# Patient Record
Sex: Female | Born: 1970 | Race: White | Hispanic: No | Marital: Married | Smoking: Current every day smoker
Health system: Southern US, Community
[De-identification: ages and names within clinical notes are randomized; demographics above are authoritative.]

## PROBLEM LIST (undated history)

## (undated) DIAGNOSIS — R05 Cough: Secondary | ICD-10-CM

## (undated) DIAGNOSIS — M545 Other chronic pain: Secondary | ICD-10-CM

## (undated) DIAGNOSIS — G971 Other reaction to spinal and lumbar puncture: Secondary | ICD-10-CM

## (undated) DIAGNOSIS — I10 Essential (primary) hypertension: Secondary | ICD-10-CM

## (undated) DIAGNOSIS — R3915 Urgency of urination: Secondary | ICD-10-CM

## (undated) DIAGNOSIS — A4902 Methicillin resistant Staphylococcus aureus infection, unspecified site: Secondary | ICD-10-CM

## (undated) DIAGNOSIS — G4700 Insomnia, unspecified: Secondary | ICD-10-CM

## (undated) DIAGNOSIS — J9819 Other pulmonary collapse: Secondary | ICD-10-CM

## (undated) DIAGNOSIS — F319 Bipolar disorder, unspecified: Secondary | ICD-10-CM

## (undated) DIAGNOSIS — G8929 Other chronic pain: Secondary | ICD-10-CM

## (undated) DIAGNOSIS — K219 Gastro-esophageal reflux disease without esophagitis: Secondary | ICD-10-CM

## (undated) DIAGNOSIS — M549 Dorsalgia, unspecified: Secondary | ICD-10-CM

## (undated) DIAGNOSIS — R059 Cough, unspecified: Secondary | ICD-10-CM

## (undated) DIAGNOSIS — F419 Anxiety disorder, unspecified: Secondary | ICD-10-CM

## (undated) DIAGNOSIS — F1910 Other psychoactive substance abuse, uncomplicated: Secondary | ICD-10-CM

## (undated) DIAGNOSIS — R531 Weakness: Secondary | ICD-10-CM

## (undated) DIAGNOSIS — J189 Pneumonia, unspecified organism: Secondary | ICD-10-CM

## (undated) DIAGNOSIS — R112 Nausea with vomiting, unspecified: Secondary | ICD-10-CM

## (undated) DIAGNOSIS — Z9889 Other specified postprocedural states: Secondary | ICD-10-CM

## (undated) HISTORY — PX: LUMBAR DISC SURGERY: SHX700

## (undated) HISTORY — DX: Other chronic pain: M54.50

## (undated) HISTORY — PX: FRACTURE SURGERY: SHX138

## (undated) HISTORY — PX: TONSILLECTOMY: SUR1361

## (undated) HISTORY — DX: Low back pain: M54.5

## (undated) HISTORY — DX: Methicillin resistant Staphylococcus aureus infection, unspecified site: A49.02

## (undated) HISTORY — PX: EPIDURAL BLOCK INJECTION: SHX1516

## (undated) HISTORY — PX: BACK SURGERY: SHX140

## (undated) HISTORY — DX: Other chronic pain: G89.29

---
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
## 1990-06-28 HISTORY — PX: PERCUTANEOUS PINNING: SHX2209

## 1994-06-28 HISTORY — PX: ANTERIOR CERVICAL DECOMP/DISCECTOMY FUSION: SHX1161

## 1998-09-26 ENCOUNTER — Other Ambulatory Visit: Admission: RE | Admit: 1998-09-26 | Discharge: 1998-09-26 | Payer: Self-pay | Admitting: Obstetrics and Gynecology

## 1998-10-14 ENCOUNTER — Encounter: Payer: Self-pay | Admitting: Emergency Medicine

## 1998-10-14 ENCOUNTER — Emergency Department (HOSPITAL_COMMUNITY): Admission: EM | Admit: 1998-10-14 | Discharge: 1998-10-14 | Payer: Self-pay | Admitting: Emergency Medicine

## 2000-06-28 HISTORY — PX: ELBOW FRACTURE SURGERY: SHX616

## 2001-03-06 ENCOUNTER — Other Ambulatory Visit: Admission: RE | Admit: 2001-03-06 | Discharge: 2001-03-06 | Payer: Self-pay | Admitting: Gynecology

## 2001-11-09 ENCOUNTER — Ambulatory Visit (HOSPITAL_BASED_OUTPATIENT_CLINIC_OR_DEPARTMENT_OTHER): Admission: RE | Admit: 2001-11-09 | Discharge: 2001-11-09 | Payer: Self-pay | Admitting: Orthopedic Surgery

## 2003-08-29 ENCOUNTER — Other Ambulatory Visit: Admission: RE | Admit: 2003-08-29 | Discharge: 2003-08-29 | Payer: Self-pay | Admitting: Gynecology

## 2006-06-28 HISTORY — PX: HARDWARE REMOVAL: SHX979

## 2006-06-28 HISTORY — PX: POSTERIOR LUMBAR FUSION: SHX6036

## 2007-06-15 ENCOUNTER — Inpatient Hospital Stay (HOSPITAL_COMMUNITY): Admission: AD | Admit: 2007-06-15 | Discharge: 2007-06-15 | Payer: Self-pay | Admitting: Obstetrics & Gynecology

## 2007-12-02 ENCOUNTER — Encounter: Admission: RE | Admit: 2007-12-02 | Discharge: 2007-12-02 | Payer: Self-pay | Admitting: Orthopedic Surgery

## 2008-02-21 ENCOUNTER — Ambulatory Visit (HOSPITAL_COMMUNITY): Admission: RE | Admit: 2008-02-21 | Discharge: 2008-02-22 | Payer: Self-pay | Admitting: Orthopaedic Surgery

## 2008-03-18 ENCOUNTER — Encounter: Admission: RE | Admit: 2008-03-18 | Discharge: 2008-03-18 | Payer: Self-pay | Admitting: Orthopaedic Surgery

## 2008-03-28 ENCOUNTER — Ambulatory Visit (HOSPITAL_COMMUNITY): Admission: RE | Admit: 2008-03-28 | Discharge: 2008-03-28 | Payer: Self-pay | Admitting: Orthopaedic Surgery

## 2008-04-01 ENCOUNTER — Encounter (INDEPENDENT_AMBULATORY_CARE_PROVIDER_SITE_OTHER): Payer: Self-pay | Admitting: Orthopaedic Surgery

## 2008-04-01 ENCOUNTER — Ambulatory Visit (HOSPITAL_COMMUNITY): Admission: RE | Admit: 2008-04-01 | Discharge: 2008-04-02 | Payer: Self-pay | Admitting: Orthopaedic Surgery

## 2008-10-02 ENCOUNTER — Emergency Department (HOSPITAL_COMMUNITY): Admission: EM | Admit: 2008-10-02 | Discharge: 2008-10-02 | Payer: Self-pay | Admitting: Emergency Medicine

## 2009-04-28 ENCOUNTER — Observation Stay (HOSPITAL_COMMUNITY): Admission: EM | Admit: 2009-04-28 | Discharge: 2009-04-29 | Payer: Self-pay | Admitting: Emergency Medicine

## 2009-05-23 ENCOUNTER — Encounter: Admission: RE | Admit: 2009-05-23 | Discharge: 2009-05-23 | Payer: Self-pay | Admitting: Neurosurgery

## 2009-06-04 ENCOUNTER — Encounter: Admission: RE | Admit: 2009-06-04 | Discharge: 2009-06-04 | Payer: Self-pay | Admitting: Neurosurgery

## 2009-07-16 ENCOUNTER — Encounter: Payer: Self-pay | Admitting: Neurosurgery

## 2009-07-18 ENCOUNTER — Inpatient Hospital Stay (HOSPITAL_COMMUNITY): Admission: RE | Admit: 2009-07-18 | Discharge: 2009-07-22 | Payer: Self-pay | Admitting: Neurosurgery

## 2009-08-25 ENCOUNTER — Encounter: Admission: RE | Admit: 2009-08-25 | Discharge: 2009-08-25 | Payer: Self-pay | Admitting: Neurosurgery

## 2009-09-18 ENCOUNTER — Encounter: Admission: RE | Admit: 2009-09-18 | Discharge: 2009-09-18 | Payer: Self-pay | Admitting: Neurosurgery

## 2009-09-21 ENCOUNTER — Emergency Department (HOSPITAL_COMMUNITY): Admission: EM | Admit: 2009-09-21 | Discharge: 2009-09-21 | Payer: Self-pay | Admitting: Emergency Medicine

## 2009-11-28 ENCOUNTER — Encounter: Admission: RE | Admit: 2009-11-28 | Discharge: 2009-11-28 | Payer: Self-pay | Admitting: Neurosurgery

## 2009-12-19 ENCOUNTER — Encounter
Admission: RE | Admit: 2009-12-19 | Discharge: 2010-03-09 | Payer: Self-pay | Admitting: Physical Medicine & Rehabilitation

## 2009-12-25 ENCOUNTER — Ambulatory Visit: Payer: Self-pay | Admitting: Physical Medicine & Rehabilitation

## 2010-03-09 ENCOUNTER — Ambulatory Visit: Payer: Self-pay | Admitting: Physical Medicine & Rehabilitation

## 2010-03-30 ENCOUNTER — Encounter
Admission: RE | Admit: 2010-03-30 | Discharge: 2010-03-30 | Payer: Self-pay | Source: Home / Self Care | Attending: Physical Medicine & Rehabilitation | Admitting: Physical Medicine & Rehabilitation

## 2010-06-10 ENCOUNTER — Ambulatory Visit (HOSPITAL_COMMUNITY): Admission: RE | Admit: 2010-06-10 | Payer: Self-pay | Source: Home / Self Care | Admitting: Neurosurgery

## 2010-07-03 ENCOUNTER — Ambulatory Visit (HOSPITAL_COMMUNITY)
Admission: RE | Admit: 2010-07-03 | Discharge: 2010-07-03 | Payer: Self-pay | Source: Home / Self Care | Attending: Neurosurgery | Admitting: Neurosurgery

## 2010-07-06 ENCOUNTER — Encounter
Admission: RE | Admit: 2010-07-06 | Discharge: 2010-07-06 | Payer: Self-pay | Source: Home / Self Care | Attending: Neurosurgery | Admitting: Neurosurgery

## 2010-07-18 ENCOUNTER — Encounter: Payer: Self-pay | Admitting: Neurosurgery

## 2010-07-19 ENCOUNTER — Encounter: Payer: Self-pay | Admitting: Neurosurgery

## 2010-07-19 ENCOUNTER — Encounter: Payer: Self-pay | Admitting: Orthopaedic Surgery

## 2010-07-29 ENCOUNTER — Encounter (HOSPITAL_COMMUNITY): Payer: Managed Care, Other (non HMO)

## 2010-07-29 DIAGNOSIS — M545 Low back pain, unspecified: Secondary | ICD-10-CM | POA: Insufficient documentation

## 2010-07-29 DIAGNOSIS — IMO0002 Reserved for concepts with insufficient information to code with codable children: Secondary | ICD-10-CM | POA: Insufficient documentation

## 2010-07-29 DIAGNOSIS — Z01812 Encounter for preprocedural laboratory examination: Secondary | ICD-10-CM | POA: Insufficient documentation

## 2010-07-29 LAB — CBC
HCT: 35.9 % — ABNORMAL LOW (ref 36.0–46.0)
MCH: 34.4 pg — ABNORMAL HIGH (ref 26.0–34.0)
MCV: 102 fL — ABNORMAL HIGH (ref 78.0–100.0)
Platelets: 346 10*3/uL (ref 150–400)
RBC: 3.52 MIL/uL — ABNORMAL LOW (ref 3.87–5.11)
WBC: 4.9 10*3/uL (ref 4.0–10.5)

## 2010-07-29 LAB — SURGICAL PCR SCREEN
MRSA, PCR: NEGATIVE
Staphylococcus aureus: POSITIVE — AB

## 2010-07-31 ENCOUNTER — Other Ambulatory Visit (HOSPITAL_COMMUNITY): Payer: Self-pay | Admitting: Neurosurgery

## 2010-07-31 ENCOUNTER — Ambulatory Visit (HOSPITAL_COMMUNITY)
Admission: RE | Admit: 2010-07-31 | Discharge: 2010-07-31 | Disposition: A | Discharge status: Home / Self Care | Payer: Managed Care, Other (non HMO) | Source: Ambulatory Visit | Attending: Neurosurgery | Admitting: Neurosurgery

## 2010-07-31 ENCOUNTER — Ambulatory Visit (HOSPITAL_COMMUNITY)
Admission: RE | Admit: 2010-07-31 | Discharge: 2010-08-01 | Disposition: A | Discharge status: Home / Self Care | Payer: Managed Care, Other (non HMO) | Attending: Neurosurgery | Admitting: Neurosurgery

## 2010-07-31 DIAGNOSIS — M545 Low back pain, unspecified: Secondary | ICD-10-CM | POA: Insufficient documentation

## 2010-07-31 DIAGNOSIS — Y832 Surgical operation with anastomosis, bypass or graft as the cause of abnormal reaction of the patient, or of later complication, without mention of misadventure at the time of the procedure: Secondary | ICD-10-CM | POA: Insufficient documentation

## 2010-07-31 DIAGNOSIS — M47817 Spondylosis without myelopathy or radiculopathy, lumbosacral region: Secondary | ICD-10-CM | POA: Insufficient documentation

## 2010-07-31 DIAGNOSIS — F17200 Nicotine dependence, unspecified, uncomplicated: Secondary | ICD-10-CM | POA: Insufficient documentation

## 2010-07-31 DIAGNOSIS — Z01818 Encounter for other preprocedural examination: Secondary | ICD-10-CM | POA: Insufficient documentation

## 2010-07-31 DIAGNOSIS — T84498A Other mechanical complication of other internal orthopedic devices, implants and grafts, initial encounter: Secondary | ICD-10-CM | POA: Insufficient documentation

## 2010-07-31 DIAGNOSIS — Z01812 Encounter for preprocedural laboratory examination: Secondary | ICD-10-CM | POA: Insufficient documentation

## 2010-08-21 NOTE — Op Note (Signed)
  NAMEANARI, [name]                 ACCOUNT NO.:  [id number]  MEDICAL RECORD NO.:  [id number]           PATIENT TYPE:  O  LOCATION:  XRAY                         FACILITY:  [facility]  PHYSICIAN:  Coletta Memos, M.D.     DATE OF BIRTH:  1970-08-06  DATE OF PROCEDURE:  07/31/2010 DATE OF DISCHARGE:  07/31/2010                              OPERATIVE REPORT   PREOPERATIVE DIAGNOSES: 1. Low back pain. 2. Lumbar spondylosis.  POSTOPERATIVE DIAGNOSES: 1. Low back pain. 2. Lumbar spondylosis. 3. Pseudoarthrosis.  PROCEDURES: 1. Removal of pedicle screws L5-S1 on the right. 2. Posterolateral arthrodesis using Infuse and Osteocel bilaterally. 3. Spire plate placement.  INDICATIONS:  [name] is a patient of mine, whom I performed a fusion approximately 12-16 months ago.  She has complained of right lower extremity pain since before the fusion and after the fusion.  The only thing that looked unusual is that the right S1 screw broached the lateral aspect of the neural foramen.  There was a possibility that it could be irritating an S1 root.  I therefore spoke to [name] and stated that we could remove the hardware.  It was also questioned by the radiologist whether or not she had a solid fusion.  OPERATIVE NOTE:  [name] was brought to the operating room, intubated, and placed under general anesthetic.  Her back was prepped.  She was draped in a sterile fashion.  I opened the skin with a #10 blade and took this down to the thoracolumbar fascia.  I was able to then on the right side to expose the pedicle screws and I removed the locking cap, the rod, and then the screws without difficulty.  I checked the facets and there certainly was no solid fusion of the facets.  I therefore at that time chose to go ahead and move to do posterolateral arthrodesis. I used a bone to decorticate the lamina and lateral margins of L5 and of S1 including sacral ala.  I placed Infuse and Osteocel over  that decorticated area.  I also decorticated the lamina on the left side at L5-S1 and again used Infuse and Osteocel.  I then placed a Spire plate since I did not have a solid fusion at the facets at the least.  I then secured the [facility] plate to the spinous processes of L5-S1.  I then closed the wound in layered fashion using Vicryl sutures, reapproximated thoracolumbar, subcutaneous and subcuticular layers.  Dermabond was used for sterile dressing.          ______________________________ Coletta Memos, M.D.     KC/MEDQ  D:  07/31/2010  T:  08/01/2010  Job:  [id number]  Electronically Signed by Coletta Memos M.D. on 08/21/2010 09:24:55 AM

## 2010-09-13 LAB — CBC
MCHC: 33.9 g/dL (ref 30.0–36.0)
MCV: 105.4 fL — ABNORMAL HIGH (ref 78.0–100.0)
Platelets: 375 10*3/uL (ref 150–400)
RBC: 4.06 MIL/uL (ref 3.87–5.11)

## 2010-09-13 LAB — URINALYSIS, ROUTINE W REFLEX MICROSCOPIC
Bilirubin Urine: NEGATIVE
Glucose, UA: NEGATIVE mg/dL
Hgb urine dipstick: NEGATIVE
Ketones, ur: NEGATIVE mg/dL
Protein, ur: NEGATIVE mg/dL
pH: 5.5 (ref 5.0–8.0)

## 2010-09-13 LAB — TYPE AND SCREEN
ABO/RH(D): O POS
Antibody Screen: NEGATIVE

## 2010-09-30 LAB — DIFFERENTIAL
Lymphocytes Relative: 22 % (ref 12–46)
Lymphs Abs: 2.3 10*3/uL (ref 0.7–4.0)
Monocytes Relative: 5 % (ref 3–12)
Neutrophils Relative %: 72 % (ref 43–77)

## 2010-09-30 LAB — URINE MICROSCOPIC-ADD ON

## 2010-09-30 LAB — CBC
MCHC: 34.3 g/dL (ref 30.0–36.0)
MCV: 103.1 fL — ABNORMAL HIGH (ref 78.0–100.0)
RBC: 4.13 MIL/uL (ref 3.87–5.11)
RDW: 13.6 % (ref 11.5–15.5)

## 2010-09-30 LAB — URINALYSIS, ROUTINE W REFLEX MICROSCOPIC
Glucose, UA: NEGATIVE mg/dL
Ketones, ur: 40 mg/dL — AB
Leukocytes, UA: NEGATIVE
Nitrite: NEGATIVE
Specific Gravity, Urine: 1.02 (ref 1.005–1.030)
pH: 5 (ref 5.0–8.0)

## 2010-09-30 LAB — POCT CARDIAC MARKERS
CKMB, poc: <1 ng/mL — ABNORMAL LOW (ref 1.0–8.0)
Myoglobin, poc: 37.8 ng/mL (ref 12–200)
Troponin i, poc: <0.05 ng/mL (ref 0.00–0.09)

## 2010-09-30 LAB — COMPREHENSIVE METABOLIC PANEL
AST: 15 U/L (ref 0–37)
CO2: 22 mEq/L (ref 19–32)
Calcium: 9.2 mg/dL (ref 8.4–10.5)
Creatinine, Ser: 0.75 mg/dL (ref 0.4–1.2)
GFR calc Af Amer: >60 mL/min (ref >60)
GFR calc non Af Amer: >60 mL/min (ref >60)
Sodium: 137 mEq/L (ref 135–145)
Total Protein: 7.1 g/dL (ref 6.0–8.3)

## 2010-09-30 LAB — LIPASE, BLOOD: Lipase: 10 U/L — ABNORMAL LOW (ref 11–59)

## 2010-09-30 LAB — POCT I-STAT, CHEM 8
BUN: 11 mg/dL (ref 6–23)
Chloride: 106 mEq/L (ref 96–112)
Creatinine, Ser: 0.8 mg/dL (ref 0.4–1.2)
Sodium: 138 mEq/L (ref 135–145)

## 2010-11-10 NOTE — Op Note (Signed)
NAME:  Rachael Ramirez, Rachael Ramirez                 ACCOUNT NO.:  [id number]   MEDICAL RECORD NO.:  [id number]          PATIENT TYPE:  OIB   LOCATION:  5033                         FACILITY:  [facility]   PHYSICIAN:  Mark C. Ophelia Charter, M.D.    DATE OF BIRTH:  1970/07/08   DATE OF PROCEDURE:  04/01/2008  DATE OF DISCHARGE:                               OPERATIVE REPORT   PREOPERATIVE DIAGNOSIS:  Right L5-S1 recurrent herniated nucleus  pulposus.   POSTOPERATIVE DIAGNOSIS:  Right L5-S1 recurrent herniated nucleus  pulposus.   PROCEDURE:  Right L5-S1 microdiskectomy for recurrent herniated nucleus  pulposus and removal of large free fragments.   SURGEON:  Mark C. Ophelia Charter, MD   ANESTHESIA:  GOT plus Marcaine local.   ESTIMATED BLOOD LOSS:  Minimal.   DRAINS:  None.   HISTORY:  This patient had done microdiskectomy on February 21, 2008, now  67-month and 1-week postop for about 6 weeks postop.  She did great for  several weeks and then suddenly developed immediate recurrent back and  right leg pain exactly as she had had before and she had been very  active postoperative.  Despite medical recommendations, had resumed all  normal active, including bending, lifting, stooping, etc.  An MRI scan  was initially done a week ago, which was read as most likely seroma.  She did not improve.  Lab work showed elevated CRP and some elevation of  her sed rate with normal white count.  The patient was afebrile without  chills or fever and a second MRI was done which looked exactly like the  MRI 1-week ago which was 45 weeks postop.  She developed progressive  weakness of the right leg and was brought back for microdiskectomy.  MRI  scans have been reviewed in detail with Dr. Paulina Fusi and he felt that  unlikely this represented HNP, however, her clinical exam worsened and  she developed progressive weakness.   PROCEDURE IN DETAIL:  After induction of general anesthesia and  orotracheal intubation, the patient was placed  on the Andrews frame in  kneeling position.  A 10 x 10 drape was placed caudally.  Old incision  was well-healed.  There was no erythema.  Back was prepped with  DuraPrep.  Preoperative Ancef was given.  Rolled yellow and gray pads  were placed on the chest.  Area was squared with towels. Betadine and Vi-  drape applied.  Sterile skin marker was used on the old incision and the  old incision was opened after surgical checklist was completed for an  intraop portion.  Ancef had been given, no anesthesia problems.  The 4-0  Vicryl old suture was removed and some 2-0 Vicryl.  Taylor retractor was  placed laterally and the laminotomy defect was well visualized.  A #4  Penfield was placed at the level of the lamina at the laminotomy defect  and x-ray was taken which confirmed that this was the correct space.  Operative microscope was draped and brought in and a little bit of the  lamina was removed proximally.  A 2-0 BS Karlin curette  was used  laterally on the wall and the 2-mm Kerrison used to remove a small  portion of the bone.  Near the shoulder of the nerve root at the  beginning of the foramina, a small tail of the disk fragment was  noticed.  This was teased out with a #4 Penfield, egressed with  micropituitary, and some giant fragment was removed.  This was followed  by several other large pieces which were all free and were sitting at  the level of disk space slightly above and below corresponding with the  MRI scan.  Ball-tip nerve hook was used to pass and oppose a couple of  remaining pieces and then there were no remaining fragments.  Hockey-  stick was used for 180 degrees sweeps in front of the dura.  Nerve root  was gently retracted.  Passes were made through the disk space.  The  disk space was almost empty with this large fragment being extruded out  through the old annular defect for the patient had a previous disk  herniation.  The wound was irrigated and nerve root was  inspected.  Passes were made out at the foramina.  No compression anterior to the  dura and basically after removal of the fragments, the pressure on the  nerve root had been completely relieved.  The bone was irrigated.  No  cultures were taken.  Tissue looked normal with no evidence of  infection.  After irrigation, D'Errico was removed.  One small vein was  coagulated with bipolar cautery with the nerve root gently retracted out  of the way with the D'Errico.  After repeat irrigation, closed the  fascia with 0 Vicryl, 2-0 Vicryl subcutaneous tissue, repeat 4-0  subcuticular closure.  Tincture of Benzoin, Steri-Strips, Marcaine  infiltration, and Mepilex dressing.  Instrument count and needle count  was correct.  The patient was transferred to the recovery room in stable  condition.      Mark C. Ophelia Charter, M.D.  Electronically Signed     MCY/MEDQ  D:  04/01/2008  T:  04/02/2008  Job:  [id number]

## 2010-11-10 NOTE — Op Note (Signed)
NAME:  Rachael Ramirez, Rachael Ramirez                 ACCOUNT NO.:  [id number]   MEDICAL RECORD NO.:  [id number]          PATIENT TYPE:  OIB   LOCATION:  5014                         FACILITY:  [facility]   PHYSICIAN:  Mark C. Ophelia Charter, M.D.    DATE OF BIRTH:  [birth year]   DATE OF PROCEDURE:  02/21/2008  DATE OF DISCHARGE:                               OPERATIVE REPORT   POSTOPERATIVE DIAGNOSIS:  Right L5-S1 herniated nucleus pulposus.   POSTOPERATIVE DIAGNOSIS:  Right L5-S1 herniated nucleus pulposus.   PROCEDURE:  Right L5-S1 microdiskectomy.   SURGEON:  Mark C. Ophelia Charter, MD   ASSISTANT:  Maud Deed, PA-C   ANESTHESIA:  GOT plus Marcaine skin local.   ESTIMATED BLOOD LOSS:  Less than 100 mL.   DRAINS:  None.   COMPLICATIONS:  None.   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  MRI scan  shows disk protrusion of the right L5-S1.   PROCEDURE IN DETAIL:  After induction of general anesthesia and  orotracheal intubation, the patient was placed in the kneeling position  with DuraPrep.  Preoperative Ancef 1 gram was given prophylactically.  Incision was made after needle localization.  Cross-table lateral x-rays  and time-out checklist was completed.  Incision was at the L5-S1 level a  couple of millimeters to the right of the midline.  Subperiosteal  dissection was performed.  Sacrum was palpated with the Cobb.  Lamina  was enlarged slightly and needle showed that the exposure was the  appropriate level.  Laminotomy was performed slightly.  Ligament was  incised with a scalpel.  Patty was used to protect the dura and  ligamentum was removed with Kerrison rongeur.  Dura and nerve root was  carefully retracted and there was a prominent protrusion central into  the right.  The annulus was incised and passes were made with micro  pituitary and  Epstein curette with upbiting micro pituitary up  underneath the bulging ligament and there was some retained disk which  was protruding.  Once this was removed, the chunks of nucleus was  removed.  The disk was flat.  Passes were made to the middle of the disk  and the remaining chunks of degenerative disk were removed followed by  irrigation.  Foramina was slightly enlarged.  Nerve root was completely  free.  Dura was intact after irrigation with saline  solution.  Deep fascia was closed with 0 Vicryl, 2-0 Vicryl for the  subcutaneous tissue, and 4-0 Vicryl for subcuticular closure, tincture  of benzoin, Steri-Strips, postop dressing, and then transferred to  recovery room in stable condition.      Mark C. Ophelia Charter, M.D.  Electronically Signed     MCY/MEDQ  D:  02/21/2008  T:  02/22/2008  Job:  [id number]

## 2010-11-13 NOTE — Op Note (Signed)
[place]  Patient:    Rachael Ramirez, Rachael Ramirez Visit Number: [id number] MRN: [id number]          Service Type: DSU Location: [facility] Attending Physician:  Ronne Binning Dictated by:   Nicki Reaper, M.D. Proc. Date: 11/09/01 Admit Date:  11/09/2001                             Operative Report  PREOPERATIVE DIAGNOSES:  Status post fracture dislocation, radial head, left wrist; tardy ulnar nerve palsy, left elbow.  POSTOPERATIVE DIAGNOSES:  Status post fracture dislocation, radial head, left wrist, tardy ulnar nerve palsy, left elbow.  OPERATION:  Excision, radial head, of gross deformity; subcutaneous transposition, ulnar nerve, left arm.  SURGEON:  Nicki Reaper, M.D.  ASSISTANT:  Joaquin Courts, R.N.  ANESTHESIA:  Axillary block.  ANESTHESIOLOGIST:  Janetta Hora. Gelene Mink, M.D.  HISTORY:  The patient is a 40 year old female with a history of an injury to her elbow with a fracture; this was when she was a child.  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  DESCRIPTION OF PROCEDURE:  The patient was brought to the operating room where an axillary block was carried out without difficulty.  She was prepped and draped using Betadine scrubbing solution with the left arm free.  The limb was exsanguinated with a Esmarch bandage.  Tourniquet placed high on the arm was inflated to 250 mmHg.  The ______  incision was made over the lateral epicondyle and carried down through subcutaneous tissue.  The anconeus-extensor interval was opened; significant deformity was present to the capitellum and into the radial head, with an anterior subluxation.  The neck was identified, protecting the posterior interosseous nerve.  Retractor was placed with moderate difficulty.  The radial head was removed using rongeurs.  Significant arthritic changes were  present.  OEC image intensification confirmed that the head was excised.  No compression was palpable.  With flexion and extension, there was no gross instability.  The arm was able to be fully pronated and supinated following the procedure, having only 10 degrees of supination prior to the excision.  The wound was irrigated, the capsule closed with figure-of-eight 4-0 Vicryl, the musculotendinous interval closed with figure-of-eight 4-0 Vicryl and subcutaneous tissue with 4-0 Vicryl after irrigation.  The incision on the medial side was then made; this was a long curvilinear incision carried down through subcutaneous tissue, bleeders again electrocauterized.  Significant scarring was present about the ulnar nerve.  Medial intermuscular septum was harvested, left attached to the epicondyle, the fasciotomy performed to the flexor carpi ulnaris, a portion of fascia elevated, left attached to the epicondyle.  The nerve was anteriorly transposed.  Throughout the procedure, the medial brachiocutaneous nerve of the arm and forearm was protected.  The nerve was then transposed anteriorly.  The slings were created, sutured with figure-of-eight 4-0 Mersilene sutures, the wound irrigated, the subcutaneous tissue closed with interrupted 4-0 Vicryl and the skin on both medial and lateral aspects closed with a subcuticular 4-0 Monocryl suture.  Steri-Strips were applied, the sterile compressive dressing and long arm splint applied. The patient tolerated the procedure well and was taken to the recovery room for observation in satisfactory condition.  She is discharged home to return to the [facility] in one week on Vicodin and  Keflex. Dictated by:   Nicki Reaper, M.D. Attending Physician:  Ronne Binning DD:  11/09/01 TD:  11/10/01 Job: [id number]

## 2011-01-19 ENCOUNTER — Emergency Department (HOSPITAL_COMMUNITY)
Admission: EM | Admit: 2011-01-19 | Discharge: 2011-01-19 | Disposition: A | Discharge status: Home / Self Care | Payer: Managed Care, Other (non HMO) | Attending: Emergency Medicine | Admitting: Emergency Medicine

## 2011-01-19 DIAGNOSIS — F411 Generalized anxiety disorder: Secondary | ICD-10-CM | POA: Insufficient documentation

## 2011-01-19 DIAGNOSIS — M545 Low back pain, unspecified: Secondary | ICD-10-CM | POA: Insufficient documentation

## 2011-01-19 DIAGNOSIS — G8929 Other chronic pain: Secondary | ICD-10-CM | POA: Insufficient documentation

## 2011-01-19 DIAGNOSIS — J45909 Unspecified asthma, uncomplicated: Secondary | ICD-10-CM | POA: Insufficient documentation

## 2011-01-19 DIAGNOSIS — Z79899 Other long term (current) drug therapy: Secondary | ICD-10-CM | POA: Insufficient documentation

## 2011-01-29 ENCOUNTER — Other Ambulatory Visit (HOSPITAL_COMMUNITY): Payer: Self-pay | Admitting: Neurosurgery

## 2011-01-29 DIAGNOSIS — M545 Low back pain, unspecified: Secondary | ICD-10-CM

## 2011-01-29 DIAGNOSIS — M542 Cervicalgia: Secondary | ICD-10-CM

## 2011-02-16 ENCOUNTER — Ambulatory Visit (HOSPITAL_COMMUNITY)
Admission: RE | Admit: 2011-02-16 | Discharge: 2011-02-16 | Disposition: A | Discharge status: Home / Self Care | Payer: Managed Care, Other (non HMO) | Source: Ambulatory Visit | Attending: Neurosurgery | Admitting: Neurosurgery

## 2011-02-16 DIAGNOSIS — Z981 Arthrodesis status: Secondary | ICD-10-CM | POA: Insufficient documentation

## 2011-02-16 DIAGNOSIS — M542 Cervicalgia: Secondary | ICD-10-CM

## 2011-02-16 DIAGNOSIS — M545 Low back pain: Secondary | ICD-10-CM

## 2011-02-16 DIAGNOSIS — M47812 Spondylosis without myelopathy or radiculopathy, cervical region: Secondary | ICD-10-CM | POA: Insufficient documentation

## 2011-02-16 MED ORDER — IOHEXOL 300 MG/ML  SOLN
10.0000 mL | Freq: Once | INTRAMUSCULAR | Status: AC | PRN
  Administered 2011-02-16: 10 mL via INTRATHECAL

## 2011-03-30 LAB — CBC
Platelets: 444 — ABNORMAL HIGH
WBC: 7.7

## 2011-04-02 LAB — URINALYSIS, ROUTINE W REFLEX MICROSCOPIC
Ketones, ur: NEGATIVE
Leukocytes, UA: NEGATIVE
Nitrite: NEGATIVE
Specific Gravity, Urine: <1.005 — ABNORMAL LOW
pH: 6

## 2011-04-02 LAB — URINE MICROSCOPIC-ADD ON

## 2011-04-08 ENCOUNTER — Emergency Department (HOSPITAL_COMMUNITY)
Admission: EM | Admit: 2011-04-08 | Discharge: 2011-04-08 | Disposition: A | Discharge status: Home / Self Care | Payer: Managed Care, Other (non HMO) | Attending: Emergency Medicine | Admitting: Emergency Medicine

## 2011-04-08 DIAGNOSIS — Z981 Arthrodesis status: Secondary | ICD-10-CM | POA: Insufficient documentation

## 2011-04-08 DIAGNOSIS — G8929 Other chronic pain: Secondary | ICD-10-CM | POA: Insufficient documentation

## 2011-04-08 DIAGNOSIS — M549 Dorsalgia, unspecified: Secondary | ICD-10-CM | POA: Insufficient documentation

## 2011-06-10 ENCOUNTER — Emergency Department (HOSPITAL_COMMUNITY)
Admission: EM | Admit: 2011-06-10 | Discharge: 2011-06-11 | Disposition: A | Discharge status: Home / Self Care | Payer: Managed Care, Other (non HMO) | Attending: Emergency Medicine | Admitting: Emergency Medicine

## 2011-06-10 ENCOUNTER — Emergency Department (HOSPITAL_COMMUNITY): Payer: Managed Care, Other (non HMO)

## 2011-06-10 ENCOUNTER — Encounter: Payer: Self-pay | Admitting: Emergency Medicine

## 2011-06-10 DIAGNOSIS — S7012XA Contusion of left thigh, initial encounter: Secondary | ICD-10-CM

## 2011-06-10 DIAGNOSIS — Y92410 Unspecified street and highway as the place of occurrence of the external cause: Secondary | ICD-10-CM | POA: Insufficient documentation

## 2011-06-10 DIAGNOSIS — S20219A Contusion of unspecified front wall of thorax, initial encounter: Secondary | ICD-10-CM | POA: Insufficient documentation

## 2011-06-10 DIAGNOSIS — M79609 Pain in unspecified limb: Secondary | ICD-10-CM | POA: Insufficient documentation

## 2011-06-10 DIAGNOSIS — M545 Low back pain, unspecified: Secondary | ICD-10-CM | POA: Insufficient documentation

## 2011-06-10 DIAGNOSIS — Z79899 Other long term (current) drug therapy: Secondary | ICD-10-CM | POA: Insufficient documentation

## 2011-06-10 DIAGNOSIS — S7010XA Contusion of unspecified thigh, initial encounter: Secondary | ICD-10-CM | POA: Insufficient documentation

## 2011-06-10 DIAGNOSIS — IMO0002 Reserved for concepts with insufficient information to code with codable children: Secondary | ICD-10-CM | POA: Insufficient documentation

## 2011-06-10 DIAGNOSIS — S32009A Unspecified fracture of unspecified lumbar vertebra, initial encounter for closed fracture: Secondary | ICD-10-CM

## 2011-06-10 DIAGNOSIS — R071 Chest pain on breathing: Secondary | ICD-10-CM | POA: Insufficient documentation

## 2011-06-10 MED ORDER — HYDROMORPHONE HCL PF 1 MG/ML IJ SOLN
1.0000 mg | Freq: Once | INTRAMUSCULAR | Status: AC
  Administered 2011-06-11: 1 mg via INTRAVENOUS
  Filled 2011-06-10: qty 1
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
## 2011-06-10 NOTE — ED Notes (Signed)
Patient undressed per doctor order

## 2011-06-10 NOTE — ED Notes (Signed)
Pt c/o lower back pain and right leg pain. Pt moving all extremities.

## 2011-06-11 MED ORDER — OXYCODONE-ACETAMINOPHEN 5-325 MG PO TABS: 2.0000 | ORAL_TABLET | ORAL | Status: AC | PRN

## 2011-06-11 MED ORDER — TETANUS-DIPHTH-ACELL PERTUSSIS 5-2.5-18.5 LF-MCG/0.5 IM SUSP
0.5000 mL | Freq: Once | INTRAMUSCULAR | Status: AC
  Administered 2011-06-11: 0.5 mL via INTRAMUSCULAR
  Filled 2011-06-11: qty 0.5

## 2011-06-11 NOTE — ED Notes (Signed)
Assisted patient to the BR and then to get dressed.  Very unsteady on her feet.
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
## 2011-10-22 ENCOUNTER — Other Ambulatory Visit: Payer: Self-pay | Admitting: Neurosurgery

## 2011-10-22 DIAGNOSIS — M545 Low back pain: Secondary | ICD-10-CM

## 2011-10-29 ENCOUNTER — Ambulatory Visit
Admission: RE | Admit: 2011-10-29 | Discharge: 2011-10-29 | Disposition: A | Discharge status: Home / Self Care | Payer: 59 | Source: Ambulatory Visit | Attending: Neurosurgery | Admitting: Neurosurgery

## 2011-10-29 DIAGNOSIS — M545 Low back pain: Secondary | ICD-10-CM

## 2011-11-30 ENCOUNTER — Emergency Department (HOSPITAL_COMMUNITY)
Admission: EM | Admit: 2011-11-30 | Discharge: 2011-11-30 | Disposition: A | Discharge status: Home / Self Care | Payer: Managed Care, Other (non HMO) | Attending: Emergency Medicine | Admitting: Emergency Medicine

## 2011-11-30 ENCOUNTER — Encounter (HOSPITAL_COMMUNITY): Payer: Self-pay | Admitting: *Deleted

## 2011-11-30 DIAGNOSIS — M549 Dorsalgia, unspecified: Secondary | ICD-10-CM | POA: Insufficient documentation

## 2011-11-30 DIAGNOSIS — G8929 Other chronic pain: Secondary | ICD-10-CM | POA: Insufficient documentation

## 2011-11-30 DIAGNOSIS — IMO0002 Reserved for concepts with insufficient information to code with codable children: Secondary | ICD-10-CM | POA: Insufficient documentation

## 2011-11-30 DIAGNOSIS — Y92410 Unspecified street and highway as the place of occurrence of the external cause: Secondary | ICD-10-CM | POA: Insufficient documentation

## 2011-11-30 DIAGNOSIS — T148XXA Other injury of unspecified body region, initial encounter: Secondary | ICD-10-CM

## 2011-11-30 HISTORY — DX: Dorsalgia, unspecified: M54.9

## 2011-11-30 MED ORDER — KETOROLAC TROMETHAMINE 60 MG/2ML IM SOLN
60.0000 mg | Freq: Once | INTRAMUSCULAR | Status: AC
  Administered 2011-11-30: 60 mg via INTRAMUSCULAR
  Filled 2011-11-30: qty 2

## 2011-11-30 MED ORDER — NAPROXEN 500 MG PO TABS: 500.0000 mg | ORAL_TABLET | Freq: Two times a day (BID) | ORAL | Status: DC

## 2011-11-30 MED ORDER — OXYCODONE-ACETAMINOPHEN 5-325 MG PO TABS: 1.0000 | ORAL_TABLET | ORAL | Status: AC | PRN

## 2011-11-30 MED ORDER — OXYCODONE-ACETAMINOPHEN 5-325 MG PO TABS
2.0000 | ORAL_TABLET | Freq: Once | ORAL | Status: AC
  Administered 2011-11-30: 2 via ORAL
  Filled 2011-11-30: qty 2

## 2011-11-30 MED ORDER — CYCLOBENZAPRINE HCL 10 MG PO TABS: 10.0000 mg | ORAL_TABLET | Freq: Two times a day (BID) | ORAL | Status: AC | PRN

## 2011-11-30 NOTE — ED Notes (Signed)
Patient given discharge instructions, information, prescriptions, and diet order. Patient states that they adequately understand discharge information given and to return to ED if symptoms return or worsen.     

## 2011-11-30 NOTE — ED Notes (Signed)
WUJ:[id number]<JY> Expected date:<BR> Expected time: 9:51 PM<BR> Means of arrival:<BR> Comments:<BR> RAND [id number] - 40yoF Back pain (chronic)

## 2011-11-30 NOTE — ED Notes (Signed)
Per EMS: patient from home with c/o chronic back pain that is now shooting down her right leg. Patient was in [facility] on Friday, she is not experiencing mid line neck pain.

## 2011-11-30 NOTE — Discharge Instructions (Signed)
Your back pain should be treated with medicines such as ibuprofen or aleve and this back pain should get better over the next 2 weeks.  However if you develop severe or worsening pain, low back pain with fever, numbness, weakness or inability to walk or urinate, you should return to the ER immediately.  Please follow up with your doctor this week for a recheck if still having symptoms.  Call your back surgeon in morning to schedule follow up for next week if no better.
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
## 2011-12-14 ENCOUNTER — Other Ambulatory Visit: Payer: Self-pay | Admitting: Neurosurgery

## 2011-12-14 DIAGNOSIS — M545 Low back pain: Secondary | ICD-10-CM

## 2011-12-20 ENCOUNTER — Ambulatory Visit
Admission: RE | Admit: 2011-12-20 | Discharge: 2011-12-20 | Disposition: A | Discharge status: Home / Self Care | Payer: Managed Care, Other (non HMO) | Source: Ambulatory Visit | Attending: Neurosurgery | Admitting: Neurosurgery

## 2011-12-20 DIAGNOSIS — M545 Low back pain: Secondary | ICD-10-CM

## 2011-12-20 MED ORDER — GADOBENATE DIMEGLUMINE 529 MG/ML IV SOLN
10.0000 mL | Freq: Once | INTRAVENOUS | Status: AC | PRN
  Administered 2011-12-20: 10 mL via INTRAVENOUS

## 2011-12-23 ENCOUNTER — Ambulatory Visit (INDEPENDENT_AMBULATORY_CARE_PROVIDER_SITE_OTHER): Payer: Managed Care, Other (non HMO) | Admitting: Emergency Medicine

## 2011-12-23 VITALS — BP 105/70 | HR 96 | Temp 98.1°F | Resp 16 | Ht 63.0 in | Wt 98.0 lb

## 2011-12-23 DIAGNOSIS — M549 Dorsalgia, unspecified: Secondary | ICD-10-CM

## 2011-12-23 DIAGNOSIS — G8929 Other chronic pain: Secondary | ICD-10-CM

## 2011-12-23 MED ORDER — HYDROCODONE-ACETAMINOPHEN 5-325 MG PO TABS: 1.0000 | ORAL_TABLET | Freq: Four times a day (QID) | ORAL | Status: AC | PRN

## 2011-12-23 NOTE — Progress Notes (Signed)
Patient Name: Rachael Ramirez Date of Birth: 12-10-1970 Medical Record Number: [id number] Gender: female Date of Encounter: 12/23/2011  History of Present Illness:  Rachael Ramirez is a 41 y.o. very pleasant female patient who presents with the following:  Patient with a history of chronic back pain on vicodin 10 from neurosurgeon.  Says that she reacts poorly to the drug and is out of the percocet that her FMD gives her.  She had an MRI done Monday which was negative for HNP or neuroal impingement.  Has degenerative changes.  Claims a history of 1 neck and four back surgeries.  Claims to have severe back pain that interferes with sleep and appetite causing weight loss and she took her flexeril and clonipine for sleep last night and again this morning.  Has a very convoluted story of disputes with her FMD and a conflict with the receptionist at her neurosurgeon's office that refuses to schedule her an appt or pass messages to the surgeon.  She does have an appt Monday to review her MRI results with the surgeon.  Requesting a prescription for percocet to last her until Monday.  There is no problem list on file for this patient.  Past Medical History  Diagnosis Date  . Asthma   . Back pain    Past Surgical History  Procedure Date  . Spinal fusion   . Hand surgery    History  Substance Use Topics  . Smoking status: Current Everyday Smoker  . Smokeless tobacco: Not on file  . Alcohol Use: No   No family history on file. No Known Allergies  Medication list has been reviewed and updated.  Prior to Admission medications   Medication Sig Start Date End Date Taking? Authorizing Provider  albuterol-ipratropium (COMBIVENT) 18-103 MCG/ACT inhaler Inhale 2 puffs into the lungs every 6 (six) hours as needed. For wheezing    Yes Historical Provider, MD  clonazePAM (KLONOPIN) 1 MG tablet Take 1 mg by mouth 3 (three) times daily as needed. For anxiety    Yes Historical Provider, MD    HYDROcodone-acetaminophen (NORCO) 10-325 MG per tablet Take 1 tablet by mouth every 6 (six) hours as needed. For pain   Yes Historical Provider, MD  lamoTRIgine (LAMICTAL) 100 MG tablet Take 100 mg by mouth daily.   Yes Historical Provider, MD  QUEtiapine (SEROQUEL) 100 MG tablet Take 100 mg by mouth at bedtime.   Yes Historical Provider, MD  traZODone (DESYREL) 100 MG tablet Take 100 mg by mouth at bedtime.   Yes Historical Provider, MD  naproxen (NAPROSYN) 500 MG tablet Take 1 tablet (500 mg total) by mouth 2 (two) times daily with a meal. 11/30/11 11/29/12  Vida Roller, MD  pregabalin (LYRICA) 225 MG capsule Take 225 mg by mouth 2 (two) times daily.      Historical Provider, MD    Review of Systems:  As per HPI, otherwise negative.    Physical Examination: Filed Vitals:   12/23/11 1652  BP: 105/70  Pulse: 96  Temp: 98.1 F (36.7 C)  Resp: 16   Filed Vitals:   12/23/11 1652  Height: 5\' 3"  (1.6 m)  Weight: 98 lb (44.453 kg)   Body mass index is 17.36 kg/(m^2). Ideal Body Weight: Weight in (lb) to have BMI = 25: 140.8    GEN: WDWN, NAD, Non-toxic, sedated & Oriented x 3 but obviously impaired.  Slow to move HEENT: Atraumatic, Normocephalic.  Ears and Nose: No external deformity. EXTR:  No clubbing/cyanosis/edema NEURO: Normal gait.  PSYCH: Normally interactive. Conversant. Not depressed or anxious appearing.  Calm demeanor.    Assessment and Plan: Back pain and drug seeking behavior after reviewing narcotic print out  Carmelina Dane, MD

## 2012-01-21 ENCOUNTER — Emergency Department (HOSPITAL_COMMUNITY)
Admission: EM | Admit: 2012-01-21 | Discharge: 2012-01-21 | Disposition: A | Discharge status: Home / Self Care | Payer: Managed Care, Other (non HMO) | Attending: Emergency Medicine | Admitting: Emergency Medicine

## 2012-01-21 ENCOUNTER — Encounter (HOSPITAL_COMMUNITY): Payer: Self-pay | Admitting: *Deleted

## 2012-01-21 DIAGNOSIS — F17200 Nicotine dependence, unspecified, uncomplicated: Secondary | ICD-10-CM | POA: Insufficient documentation

## 2012-01-21 DIAGNOSIS — J45909 Unspecified asthma, uncomplicated: Secondary | ICD-10-CM | POA: Insufficient documentation

## 2012-01-21 DIAGNOSIS — M549 Dorsalgia, unspecified: Secondary | ICD-10-CM | POA: Insufficient documentation

## 2012-01-21 DIAGNOSIS — G8929 Other chronic pain: Secondary | ICD-10-CM

## 2012-01-21 MED ORDER — OXYCODONE-ACETAMINOPHEN 5-325 MG PO TABS
2.0000 | ORAL_TABLET | Freq: Once | ORAL | Status: AC
  Administered 2012-01-21: 2 via ORAL
  Filled 2012-01-21: qty 2

## 2012-01-21 MED ORDER — LIDOCAINE 5 % EX PTCH: 1.0000 | MEDICATED_PATCH | CUTANEOUS | Status: AC

## 2012-01-21 NOTE — ED Notes (Signed)
Per ems: pt called for chronic back pain. Pt is out of pain medication and is unable to get to the pain management clinic to next. Her surgeon is out of town. Pt is ambulatory. Pt has not had fall or trauma

## 2012-01-21 NOTE — ED Provider Notes (Signed)
Medical screening examination/treatment/procedure(s) were performed by non-physician practitioner and as supervising physician I was immediately available for consultation/collaboration.  Kalvin Buss, MD 01/21/12 2358 
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
## 2012-04-02 ENCOUNTER — Emergency Department (HOSPITAL_COMMUNITY)
Admission: EM | Admit: 2012-04-02 | Discharge: 2012-04-02 | Disposition: A | Discharge status: Home / Self Care | Payer: Managed Care, Other (non HMO) | Attending: Emergency Medicine | Admitting: Emergency Medicine

## 2012-04-02 ENCOUNTER — Encounter (HOSPITAL_COMMUNITY): Payer: Self-pay | Admitting: *Deleted

## 2012-04-02 ENCOUNTER — Emergency Department (HOSPITAL_COMMUNITY): Payer: Managed Care, Other (non HMO)

## 2012-04-02 DIAGNOSIS — Z79899 Other long term (current) drug therapy: Secondary | ICD-10-CM | POA: Insufficient documentation

## 2012-04-02 DIAGNOSIS — M542 Cervicalgia: Secondary | ICD-10-CM | POA: Insufficient documentation

## 2012-04-02 MED ORDER — OXYCODONE-ACETAMINOPHEN 5-325 MG PO TABS
2.0000 | ORAL_TABLET | Freq: Once | ORAL | Status: AC
  Administered 2012-04-02: 2 via ORAL
  Filled 2012-04-02: qty 2
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
## 2012-04-02 NOTE — ED Notes (Signed)
Pt alert and oriented x4. Respirations even and unlabored, bilateral symmetrical rise and fall of chest. Skin warm and dry. In no acute distress. Denies needs.   

## 2012-04-02 NOTE — ED Notes (Signed)
Pt to xray

## 2012-04-02 NOTE — ED Notes (Signed)
ZOX:[id number]<AV> Expected date:04/02/12<BR> Expected time: 1:01 PM<BR> Means of arrival:Ambulance<BR> Comments:<BR> Neck pain

## 2012-04-02 NOTE — ED Notes (Signed)
Pt escorted to d/c window. Verbalized understanding d/c instructions. In no acute distress.  
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
## 2012-09-29 ENCOUNTER — Ambulatory Visit: Payer: Managed Care, Other (non HMO)

## 2012-09-29 ENCOUNTER — Ambulatory Visit (INDEPENDENT_AMBULATORY_CARE_PROVIDER_SITE_OTHER): Payer: Managed Care, Other (non HMO) | Admitting: Emergency Medicine

## 2012-09-29 VITALS — BP 108/66 | HR 95 | Temp 98.0°F | Resp 16

## 2012-09-29 DIAGNOSIS — S93409A Sprain of unspecified ligament of unspecified ankle, initial encounter: Secondary | ICD-10-CM

## 2012-09-29 DIAGNOSIS — S93609A Unspecified sprain of unspecified foot, initial encounter: Secondary | ICD-10-CM

## 2012-09-29 DIAGNOSIS — F319 Bipolar disorder, unspecified: Secondary | ICD-10-CM

## 2012-09-29 DIAGNOSIS — M25572 Pain in left ankle and joints of left foot: Secondary | ICD-10-CM

## 2012-09-29 DIAGNOSIS — M25579 Pain in unspecified ankle and joints of unspecified foot: Secondary | ICD-10-CM

## 2012-09-29 DIAGNOSIS — S93602A Unspecified sprain of left foot, initial encounter: Secondary | ICD-10-CM

## 2012-09-29 DIAGNOSIS — S93402A Sprain of unspecified ligament of left ankle, initial encounter: Secondary | ICD-10-CM

## 2012-09-29 MED ORDER — HYDROCODONE-ACETAMINOPHEN 5-325 MG PO TABS: 1.0000 | ORAL_TABLET | Freq: Four times a day (QID) | ORAL | Status: DC | PRN

## 2012-09-29 NOTE — Progress Notes (Signed)
  Subjective:    Patient ID: Rachael Ramirez, female    DOB: [birth year], 42 y.o.   MRN: [id number]  HPI  Pt went slipped and injured her foot on Wed. Felt like it bent under her when she fell.  She has had significant pain and swelling in her foot and ankle. She is unable to bear weight without assistance .  Review of Systems     Objective:   Physical Exam the majority of the swelling is over the midportion of the foot where there is both swelling and ecchymoses. There is tenderness bilaterally over the ankle joint medially and laterally.  [facility] reading (PRIMARY) by  Dr. Cleta Alberts no definite fracture seen please, on the ligamentous structures in the midfoot        Assessment & Plan:  Patient to be placed in a long cam boot along with crutches . She is given hydrocodone for pain. Recheck on Monday. X-rays to radiologist for their reading.

## 2012-10-03 ENCOUNTER — Telehealth: Payer: Self-pay

## 2012-10-03 NOTE — Telephone Encounter (Signed)
Please call the patient led a note that it is important that she return to clinic for recheck and re evaluation of her foot. It is very important that she return to clinic if the foot is not doing well.

## 2012-10-03 NOTE — Telephone Encounter (Signed)
Pt would like dr. Cleta Alberts wants to tell him how her foot feels and looks.  It is not getting better, but worse.  No broken bones.  Supposed to come back yesterday, wants to give it more time.  Feeling worse.   [phone]

## 2012-10-03 NOTE — Telephone Encounter (Signed)
Patient calling to let us know she did not come in yesterday because she wants to give more time for her ankle to heal/ she does advise she feels like it is worse, not better, please advise.

## 2012-10-04 NOTE — Telephone Encounter (Signed)
Thanks, left message for her to call me back.

## 2012-10-05 NOTE — Telephone Encounter (Signed)
Send letter to patient that she is to followup here or with the orthopedist of her choice if she is continuing to have problems

## 2012-10-05 NOTE — Telephone Encounter (Signed)
Thanks, letter sent/ Amy

## 2012-10-05 NOTE — Telephone Encounter (Signed)
Left another message, to you FYI patient not returning my calls.

## 2013-02-05 DIAGNOSIS — Z0271 Encounter for disability determination: Secondary | ICD-10-CM

## 2013-02-22 ENCOUNTER — Encounter (HOSPITAL_COMMUNITY): Payer: Self-pay | Admitting: Emergency Medicine

## 2013-02-22 ENCOUNTER — Observation Stay (HOSPITAL_COMMUNITY)
Admission: EM | Admit: 2013-02-22 | Discharge: 2013-02-23 | Disposition: A | Discharge status: Home / Self Care | Payer: Managed Care, Other (non HMO) | Attending: Emergency Medicine | Admitting: Emergency Medicine

## 2013-02-22 ENCOUNTER — Emergency Department (HOSPITAL_COMMUNITY): Payer: Managed Care, Other (non HMO)

## 2013-02-22 DIAGNOSIS — Z79899 Other long term (current) drug therapy: Secondary | ICD-10-CM | POA: Insufficient documentation

## 2013-02-22 DIAGNOSIS — M25539 Pain in unspecified wrist: Secondary | ICD-10-CM | POA: Insufficient documentation

## 2013-02-22 DIAGNOSIS — IMO0002 Reserved for concepts with insufficient information to code with codable children: Principal | ICD-10-CM | POA: Insufficient documentation

## 2013-02-22 DIAGNOSIS — R55 Syncope and collapse: Secondary | ICD-10-CM

## 2013-02-22 DIAGNOSIS — G8929 Other chronic pain: Secondary | ICD-10-CM | POA: Insufficient documentation

## 2013-02-22 DIAGNOSIS — M549 Dorsalgia, unspecified: Secondary | ICD-10-CM | POA: Insufficient documentation

## 2013-02-22 LAB — CBC
HCT: 40.5 % (ref 36.0–46.0)
MCV: 98.5 fL (ref 78.0–100.0)
RBC: 4.11 MIL/uL (ref 3.87–5.11)
WBC: 7.8 10*3/uL (ref 4.0–10.5)

## 2013-02-22 LAB — TROPONIN I: Troponin I: <0.3 ng/mL (ref <0.30)

## 2013-02-22 LAB — BASIC METABOLIC PANEL
BUN: 4 mg/dL — ABNORMAL LOW (ref 6–23)
CO2: 25 mEq/L (ref 19–32)
Chloride: 107 mEq/L (ref 96–112)
Creatinine, Ser: 0.59 mg/dL (ref 0.50–1.10)

## 2013-02-22 LAB — URINALYSIS, ROUTINE W REFLEX MICROSCOPIC
Bilirubin Urine: NEGATIVE
Glucose, UA: NEGATIVE mg/dL
Ketones, ur: NEGATIVE mg/dL
Leukocytes, UA: NEGATIVE
pH: 6 (ref 5.0–8.0)

## 2013-02-22 LAB — WET PREP, GENITAL: Yeast Wet Prep HPF POC: NONE SEEN

## 2013-02-22 LAB — RAPID URINE DRUG SCREEN, HOSP PERFORMED
Amphetamines: NOT DETECTED
Benzodiazepines: NOT DETECTED

## 2013-02-22 MED ORDER — METRONIDAZOLE 500 MG PO TABS
2000.0000 mg | ORAL_TABLET | Freq: Once | ORAL | Status: AC
  Administered 2013-02-23: 2000 mg via ORAL
  Filled 2013-02-22 (×2): qty 4

## 2013-02-22 MED ORDER — LORAZEPAM 1 MG PO TABS: 1.0000 mg | ORAL_TABLET | Freq: Three times a day (TID) | ORAL | Status: DC | PRN

## 2013-02-22 MED ORDER — SODIUM CHLORIDE 0.9 % IV BOLUS (SEPSIS)
1000.0000 mL | Freq: Once | INTRAVENOUS | Status: AC
  Administered 2013-02-22: 1000 mL via INTRAVENOUS

## 2013-02-22 MED ORDER — CEFTRIAXONE SODIUM 250 MG IJ SOLR
250.0000 mg | Freq: Once | INTRAMUSCULAR | Status: AC
  Administered 2013-02-23: 250 mg via INTRAMUSCULAR
  Filled 2013-02-22: qty 250

## 2013-02-22 MED ORDER — ZOLPIDEM TARTRATE 5 MG PO TABS: 5.0000 mg | ORAL_TABLET | Freq: Every evening | ORAL | Status: DC | PRN

## 2013-02-22 MED ORDER — LEVONORGESTREL 1.5 MG PO TABS
1.5000 mg | ORAL_TABLET | Freq: Once | ORAL | Status: AC
  Administered 2013-02-23: 1.5 mg via ORAL
  Filled 2013-02-22: qty 1

## 2013-02-22 MED ORDER — AZITHROMYCIN 250 MG PO TABS
1000.0000 mg | ORAL_TABLET | Freq: Once | ORAL | Status: AC
  Administered 2013-02-23: 1000 mg via ORAL
  Filled 2013-02-22 (×2): qty 4

## 2013-02-22 MED ORDER — IBUPROFEN 400 MG PO TABS: 600.0000 mg | ORAL_TABLET | Freq: Three times a day (TID) | ORAL | Status: DC | PRN

## 2013-02-22 MED ORDER — SODIUM CHLORIDE 0.9 % IV SOLN
Freq: Once | INTRAVENOUS | Status: AC
  Administered 2013-02-23: 02:00:00 via INTRAVENOUS

## 2013-02-22 MED ORDER — ALUM & MAG HYDROXIDE-SIMETH 200-200-20 MG/5ML PO SUSP: 30.0000 mL | ORAL | Status: DC | PRN

## 2013-02-22 MED ORDER — ONDANSETRON HCL 4 MG PO TABS: 4.0000 mg | ORAL_TABLET | Freq: Three times a day (TID) | ORAL | Status: DC | PRN

## 2013-02-22 MED ORDER — PROMETHAZINE HCL 25 MG/ML IJ SOLN
25.0000 mg | Freq: Once | INTRAMUSCULAR | Status: AC
  Administered 2013-02-23: 25 mg via INTRAVENOUS
  Filled 2013-02-22: qty 1

## 2013-02-22 MED ORDER — NICOTINE 21 MG/24HR TD PT24: 21.0000 mg | MEDICATED_PATCH | Freq: Every day | TRANSDERMAL | Status: DC

## 2013-02-22 NOTE — ED Notes (Signed)
SANE RN en route.

## 2013-02-22 NOTE — ED Notes (Signed)
PT states that she didn't fall. She lost her balance and braced herself against door, easing herself to the floor. Charge RN aware
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
## 2013-02-22 NOTE — ED Notes (Addendum)
Post Fall assessment. PT had lost balance and slumped against back of door. PT was getting out of bed on her own to get a pillow. Bed rails up and call bell at bedside. PT states that she did not know that call bell was at bedside. Charge RN notified and EDP notified.

## 2013-02-22 NOTE — ED Notes (Signed)
PA at bedside for evaluation

## 2013-02-22 NOTE — ED Notes (Signed)
Pt requesting to drink water.  Pt informed that she needed to wait for the SANE RN to perform exam.
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
## 2013-02-22 NOTE — ED Notes (Signed)
PT refusing SANE exam. EDP made aware
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
## 2013-02-22 NOTE — ED Notes (Signed)
Pt was found at the bottom of the bed, lying with head up against the bed and was unresponsive to touch and voice. The tech immediately reported to RN in charge.

## 2013-02-22 NOTE — ED Notes (Signed)
SANE RN at bedside.

## 2013-02-22 NOTE — ED Notes (Signed)
Pt sitting in bedside chair beside the bed.  Home clothes left on patient.

## 2013-02-23 ENCOUNTER — Encounter (HOSPITAL_COMMUNITY): Payer: Self-pay | Admitting: Radiology

## 2013-02-23 ENCOUNTER — Emergency Department (HOSPITAL_COMMUNITY)
Admission: EM | Admit: 2013-02-23 | Discharge: 2013-02-23 | Disposition: A | Discharge status: Home / Self Care | Payer: Managed Care, Other (non HMO) | Attending: Emergency Medicine | Admitting: Emergency Medicine

## 2013-02-23 ENCOUNTER — Emergency Department (HOSPITAL_COMMUNITY): Payer: Managed Care, Other (non HMO)

## 2013-02-23 ENCOUNTER — Encounter (HOSPITAL_COMMUNITY): Payer: Self-pay | Admitting: Emergency Medicine

## 2013-02-23 DIAGNOSIS — IMO0002 Reserved for concepts with insufficient information to code with codable children: Secondary | ICD-10-CM

## 2013-02-23 DIAGNOSIS — Z3202 Encounter for pregnancy test, result negative: Secondary | ICD-10-CM | POA: Insufficient documentation

## 2013-02-23 DIAGNOSIS — Y9210 Unspecified residential institution as the place of occurrence of the external cause: Secondary | ICD-10-CM | POA: Insufficient documentation

## 2013-02-23 DIAGNOSIS — S0990XA Unspecified injury of head, initial encounter: Secondary | ICD-10-CM | POA: Insufficient documentation

## 2013-02-23 DIAGNOSIS — R55 Syncope and collapse: Secondary | ICD-10-CM | POA: Insufficient documentation

## 2013-02-23 DIAGNOSIS — W1809XA Striking against other object with subsequent fall, initial encounter: Secondary | ICD-10-CM | POA: Insufficient documentation

## 2013-02-23 DIAGNOSIS — Z9889 Other specified postprocedural states: Secondary | ICD-10-CM | POA: Insufficient documentation

## 2013-02-23 DIAGNOSIS — R42 Dizziness and giddiness: Secondary | ICD-10-CM

## 2013-02-23 DIAGNOSIS — R5381 Other malaise: Secondary | ICD-10-CM | POA: Insufficient documentation

## 2013-02-23 DIAGNOSIS — Y939 Activity, unspecified: Secondary | ICD-10-CM | POA: Insufficient documentation

## 2013-02-23 DIAGNOSIS — F17200 Nicotine dependence, unspecified, uncomplicated: Secondary | ICD-10-CM | POA: Insufficient documentation

## 2013-02-23 DIAGNOSIS — J45909 Unspecified asthma, uncomplicated: Secondary | ICD-10-CM | POA: Insufficient documentation

## 2013-02-23 DIAGNOSIS — Z79899 Other long term (current) drug therapy: Secondary | ICD-10-CM | POA: Insufficient documentation

## 2013-02-23 DIAGNOSIS — R109 Unspecified abdominal pain: Secondary | ICD-10-CM | POA: Insufficient documentation

## 2013-02-23 DIAGNOSIS — Z87898 Personal history of other specified conditions: Secondary | ICD-10-CM

## 2013-02-23 LAB — URINALYSIS, ROUTINE W REFLEX MICROSCOPIC
Glucose, UA: NEGATIVE mg/dL
Hgb urine dipstick: NEGATIVE
Leukocytes, UA: NEGATIVE
Protein, ur: NEGATIVE mg/dL
Specific Gravity, Urine: 1.011 (ref 1.005–1.030)

## 2013-02-23 LAB — RAPID URINE DRUG SCREEN, HOSP PERFORMED
Amphetamines: NOT DETECTED
Cocaine: NOT DETECTED
Opiates: POSITIVE — AB
Tetrahydrocannabinol: NOT DETECTED

## 2013-02-23 LAB — HIV ANTIBODY (ROUTINE TESTING W REFLEX): HIV: NONREACTIVE

## 2013-02-23 LAB — ACETAMINOPHEN LEVEL: Acetaminophen (Tylenol), Serum: <15 ug/mL (ref 10–30)

## 2013-02-23 LAB — RPR: RPR Ser Ql: NONREACTIVE

## 2013-02-23 LAB — POCT PREGNANCY, URINE: Preg Test, Ur: NEGATIVE

## 2013-02-23 LAB — GC/CHLAMYDIA PROBE AMP: GC Probe RNA: NEGATIVE

## 2013-02-23 MED ORDER — SODIUM CHLORIDE 0.9 % IV BOLUS (SEPSIS)
1000.0000 mL | Freq: Once | INTRAVENOUS | Status: AC
  Administered 2013-02-23: 1000 mL via INTRAVENOUS

## 2013-02-23 MED ORDER — ONDANSETRON HCL 4 MG/2ML IJ SOLN: 4.0000 mg | Freq: Three times a day (TID) | INTRAMUSCULAR | Status: DC | PRN

## 2013-02-23 MED ORDER — MECLIZINE HCL 25 MG PO TABS
50.0000 mg | ORAL_TABLET | Freq: Once | ORAL | Status: AC
  Administered 2013-02-23: 50 mg via ORAL
  Filled 2013-02-23 (×2): qty 1

## 2013-02-23 MED ORDER — LORAZEPAM 0.5 MG PO TABS: 0.2500 mg | ORAL_TABLET | Freq: Four times a day (QID) | ORAL | Status: DC | PRN

## 2013-02-23 MED ORDER — SODIUM CHLORIDE 0.9 % IV SOLN: INTRAVENOUS | Status: DC

## 2013-02-23 NOTE — ED Notes (Addendum)
Patient requesting to go home- per Mardene Celeste. If patient can walk unassisted then she can leave. While assisting patient to ambulate, pt became weak and was assisted to the floor safely. Patient remained conscious the entire time, but would not verbally respond. No injury of patient during syncopal episode. Pt was assisted back to bed and immediately began talking and communicating. Mardene Celeste. Witnessed patient becoming weak in the hall. Pt placed back on cardiac monitor. A&Ox4 when back in bed.

## 2013-02-23 NOTE — ED Notes (Signed)
Apparently the pt took her own iv out while in the other  Section.  The pt slid to the floor while the tech was holding on to her as she insisted on calling from the desk phone.  No  injury

## 2013-02-23 NOTE — ED Notes (Signed)
Pt getting dressed at bedside, pt unable to stand, pt refusing to stay, pt took out her IV.
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
## 2013-02-23 NOTE — ED Notes (Signed)
Patient states that she needs to go to the restroom. Patient was unable to sit up in bed without assistance. I instructed patient that I did not feel comfortable with her getting up without my help. Pt stating that she wants to go home. I explained to pt that myself, the charge nurse and the EDP felt that it would be best for her to stay in the hospital tonight for further evaluation. Pt still refusing. Charge nurse at bedside at this time.

## 2013-02-23 NOTE — ED Notes (Signed)
Patient's husband is at bedside and patient states she wants to leave.    Patient states she's not going to wait on medicine or anything else.  Patient ambulated with husband to the restroom.  Patient requires a lot of assistance in ambulation.

## 2013-02-23 NOTE — ED Notes (Signed)
The pt reports that she is hurting  ALL OVER

## 2013-02-23 NOTE — ED Notes (Addendum)
Dr. Dierdre Highman advised that patient wants to leave.  He advised if patient leaves now, she will be leaving AMA.   Dierdre Highman advised that he will be coming to see patient before she leaves.   Patient doesn't want to stay to see the doctor.  Cassie, charge RN aware.
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
## 2013-02-23 NOTE — ED Notes (Signed)
I explained to patient that we were going to transfer patient to another section in the ED. Pt still stating that she wants to go home. Pt informed that if she was able to walk safely she could leave AMA. Pt stating that she will try to go to the bathroom. Pt has not attempted to get up on her own at this time.

## 2013-02-23 NOTE — ED Notes (Signed)
The pt reports that she has never had these fainting spells in the past.  She reports that she gets dizzy and then blacks out when she is upright.  The pt is still attempting to leave.
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
## 2013-02-23 NOTE — ED Notes (Signed)
The pt is  resting
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
## 2013-02-23 NOTE — ED Notes (Signed)
Patient states that she is going to take her IV out and go home. I explained to the patient that she could not leave until she was able to walk without assistance. Pt attempted to get out of bed, instructed patient to remain in bed until I could get a wheelchair to assist her. Pt agreed to stay in bed until assistance was available.
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
## 2013-02-23 NOTE — ED Notes (Signed)
Pt. Ambulated down the hall and back to her room with out difficulty. Pt. States she is unsteady on her feet due to back pain. Pt. States she is not dizzy. Pt. Redirected back to her bed, but unsteady on her feet.
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
## 2013-02-23 NOTE — ED Notes (Signed)
Attempted to walk patient to bathroom.  Patient became limp and acted like she was going to pass out.
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
## 2013-07-05 ENCOUNTER — Encounter (HOSPITAL_COMMUNITY): Payer: Self-pay

## 2013-07-05 ENCOUNTER — Other Ambulatory Visit: Payer: Self-pay | Admitting: Neurosurgery

## 2013-07-11 ENCOUNTER — Encounter (HOSPITAL_COMMUNITY)
Admission: RE | Admit: 2013-07-11 | Discharge: 2013-07-11 | Disposition: A | Discharge status: Home / Self Care | Payer: Managed Care, Other (non HMO) | Source: Ambulatory Visit | Attending: Neurosurgery | Admitting: Neurosurgery

## 2013-07-11 ENCOUNTER — Encounter (HOSPITAL_COMMUNITY)
Admission: RE | Admit: 2013-07-11 | Discharge: 2013-07-11 | Disposition: A | Discharge status: Home / Self Care | Payer: Managed Care, Other (non HMO) | Source: Ambulatory Visit | Attending: Anesthesiology | Admitting: Anesthesiology

## 2013-07-11 ENCOUNTER — Encounter (HOSPITAL_COMMUNITY): Payer: Self-pay

## 2013-07-11 HISTORY — DX: Weakness: R53.1

## 2013-07-11 HISTORY — DX: Cough: R05

## 2013-07-11 HISTORY — DX: Other specified postprocedural states: Z98.890

## 2013-07-11 HISTORY — DX: Gastro-esophageal reflux disease without esophagitis: K21.9

## 2013-07-11 HISTORY — DX: Insomnia, unspecified: G47.00

## 2013-07-11 HISTORY — DX: Other reaction to spinal and lumbar puncture: G97.1

## 2013-07-11 HISTORY — DX: Anxiety disorder, unspecified: F41.9

## 2013-07-11 HISTORY — DX: Cough, unspecified: R05.9

## 2013-07-11 HISTORY — DX: Other specified postprocedural states: R11.2

## 2013-07-11 HISTORY — DX: Bipolar disorder, unspecified: F31.9

## 2013-07-11 HISTORY — DX: Urgency of urination: R39.15

## 2013-07-11 LAB — CBC
HCT: 42.7 % (ref 36.0–46.0)
HEMOGLOBIN: 14.6 g/dL (ref 12.0–15.0)
MCH: 35.4 pg — AB (ref 26.0–34.0)
MCHC: 34.2 g/dL (ref 30.0–36.0)
MCV: 103.4 fL — ABNORMAL HIGH (ref 78.0–100.0)
Platelets: 277 10*3/uL (ref 150–400)
RBC: 4.13 MIL/uL (ref 3.87–5.11)
RDW: 14 % (ref 11.5–15.5)
WBC: 7.3 10*3/uL (ref 4.0–10.5)

## 2013-07-11 LAB — BASIC METABOLIC PANEL
BUN: 4 mg/dL — ABNORMAL LOW (ref 6–23)
CO2: 27 meq/L (ref 19–32)
Calcium: 9.2 mg/dL (ref 8.4–10.5)
Chloride: 101 mEq/L (ref 96–112)
Creatinine, Ser: 0.67 mg/dL (ref 0.50–1.10)
GFR calc Af Amer: >90 mL/min (ref >90)
GFR calc non Af Amer: >90 mL/min (ref >90)
GLUCOSE: 83 mg/dL (ref 70–99)
POTASSIUM: 3.4 meq/L — AB (ref 3.7–5.3)
SODIUM: 140 meq/L (ref 137–147)

## 2013-07-11 LAB — SURGICAL PCR SCREEN
MRSA, PCR: POSITIVE — AB
STAPHYLOCOCCUS AUREUS: POSITIVE — AB

## 2013-07-11 LAB — HCG, SERUM, QUALITATIVE: Preg, Serum: NEGATIVE

## 2013-07-11 NOTE — Progress Notes (Signed)
Elevated creatinine in May 2014-1.79 but states was dehydrated

## 2013-07-11 NOTE — Pre-Procedure Instructions (Signed)
Rachael BushyRachel L Kurtzman  07/11/2013   Your procedure is scheduled on:  Fri, Jan 16 @ 3:15 PM  [facility]  at 12:15 AM.  Call this number if you have problems the morning of surgery: [phone]   Remember:   Do not eat food or drink liquids after midnight.   Take these medicines the morning of surgery with A SIP OF WATER: Albuterol<Bring Your Inhaler With You>,Clonazepam(Klonopin),Pain Pill(if needed),Lamictal(Lamotrigine),and Lyrica(Pregabalin)              No Goody's,BC's,Aspirin,Aleve,Ibuprofen,Fish Oil,or any Herbal Medications   Do not wear jewelry, make-up or nail polish.  Do not wear lotions, powders, or perfumes. You may wear deodorant.  Do not shave 48 hours prior to surgery.   Do not bring valuables to the hospital.  [facility] is not responsible                  for any belongings or valuables.               Contacts, dentures or bridgework may not be worn into surgery.  Leave suitcase in the car. After surgery it may be brought to your room.  For patients admitted to the hospital, discharge time is determined by your                treatment team.               Patients discharged the day of surgery will not be allowed to drive  home.    Special Instructions: Shower using CHG 2 nights before surgery and the night before surgery.  If you shower the day of surgery use CHG.  Use special wash - you have one bottle of CHG for all showers.  You should use approximately 1/3 of the bottle for each shower.   Please read over the following fact sheets that you were given: Pain Booklet, Coughing and Deep Breathing, MRSA Information and Surgical Site Infection Prevention

## 2013-07-11 NOTE — Progress Notes (Addendum)
  Pt doesn't have a cardiologist  Denies ever having an echo/stress test/heart cath  Denies CXR in past yr   EKG in epic from 02-22-13   Dr.Wilson Jeannetta Naplkins is [facility]

## 2013-07-12 MED ORDER — CEFAZOLIN SODIUM-DEXTROSE 2-3 GM-% IV SOLR
2.0000 g | INTRAVENOUS | Status: AC
  Administered 2013-07-13: 2 g via INTRAVENOUS
  Filled 2013-07-12: qty 50

## 2013-07-13 ENCOUNTER — Encounter (HOSPITAL_COMMUNITY): Payer: Managed Care, Other (non HMO) | Admitting: Vascular Surgery

## 2013-07-13 ENCOUNTER — Ambulatory Visit (HOSPITAL_COMMUNITY)
Admission: RE | Admit: 2013-07-13 | Discharge: 2013-07-14 | Disposition: A | Discharge status: Home / Self Care | Payer: Managed Care, Other (non HMO) | Source: Ambulatory Visit | Attending: Neurosurgery | Admitting: Neurosurgery

## 2013-07-13 ENCOUNTER — Encounter (HOSPITAL_COMMUNITY)
Admission: RE | Disposition: A | Discharge status: Home / Self Care | Payer: Self-pay | Source: Ambulatory Visit | Attending: Neurosurgery

## 2013-07-13 ENCOUNTER — Ambulatory Visit (HOSPITAL_COMMUNITY): Payer: Managed Care, Other (non HMO)

## 2013-07-13 ENCOUNTER — Encounter (HOSPITAL_COMMUNITY): Payer: Self-pay | Admitting: Certified Registered Nurse Anesthetist

## 2013-07-13 ENCOUNTER — Ambulatory Visit (HOSPITAL_COMMUNITY): Payer: Managed Care, Other (non HMO) | Admitting: Vascular Surgery

## 2013-07-13 DIAGNOSIS — M5126 Other intervertebral disc displacement, lumbar region: Secondary | ICD-10-CM | POA: Insufficient documentation

## 2013-07-13 DIAGNOSIS — F17200 Nicotine dependence, unspecified, uncomplicated: Secondary | ICD-10-CM | POA: Insufficient documentation

## 2013-07-13 DIAGNOSIS — K219 Gastro-esophageal reflux disease without esophagitis: Secondary | ICD-10-CM | POA: Insufficient documentation

## 2013-07-13 DIAGNOSIS — Z01818 Encounter for other preprocedural examination: Secondary | ICD-10-CM | POA: Insufficient documentation

## 2013-07-13 DIAGNOSIS — J45909 Unspecified asthma, uncomplicated: Secondary | ICD-10-CM | POA: Insufficient documentation

## 2013-07-13 DIAGNOSIS — Z01812 Encounter for preprocedural laboratory examination: Secondary | ICD-10-CM | POA: Insufficient documentation

## 2013-07-13 HISTORY — PX: LUMBAR LAMINECTOMY/DECOMPRESSION MICRODISCECTOMY: SHX5026

## 2013-07-13 SURGERY — LUMBAR LAMINECTOMY/DECOMPRESSION MICRODISCECTOMY 1 LEVEL
Anesthesia: General | Site: Back | Laterality: Left

## 2013-07-13 MED ORDER — DEXAMETHASONE SODIUM PHOSPHATE 4 MG/ML IJ SOLN
INTRAMUSCULAR | Status: DC | PRN
  Administered 2013-07-13: 4 mg via INTRAVENOUS

## 2013-07-13 MED ORDER — ONDANSETRON HCL 4 MG/2ML IJ SOLN: 4.0000 mg | Freq: Once | INTRAMUSCULAR | Status: DC | PRN

## 2013-07-13 MED ORDER — POLYETHYLENE GLYCOL 3350 17 G PO PACK
17.0000 g | PACK | Freq: Every day | ORAL | Status: DC | PRN
  Filled 2013-07-13: qty 1

## 2013-07-13 MED ORDER — MIDAZOLAM HCL 5 MG/5ML IJ SOLN
INTRAMUSCULAR | Status: DC | PRN
  Administered 2013-07-13: 2 mg via INTRAVENOUS

## 2013-07-13 MED ORDER — OXYCODONE HCL 5 MG/5ML PO SOLN: 5.0000 mg | Freq: Once | ORAL | Status: AC | PRN

## 2013-07-13 MED ORDER — IPRATROPIUM-ALBUTEROL 18-103 MCG/ACT IN AERO: 2.0000 | INHALATION_SPRAY | Freq: Four times a day (QID) | RESPIRATORY_TRACT | Status: DC | PRN

## 2013-07-13 MED ORDER — OXYCODONE HCL 5 MG PO TABS
ORAL_TABLET | ORAL | Status: AC
  Filled 2013-07-13: qty 1

## 2013-07-13 MED ORDER — LIDOCAINE-EPINEPHRINE 0.5 %-1:200000 IJ SOLN
INTRAMUSCULAR | Status: DC | PRN
  Administered 2013-07-13: 7 mL

## 2013-07-13 MED ORDER — SODIUM CHLORIDE 0.9 % IJ SOLN: 3.0000 mL | INTRAMUSCULAR | Status: DC | PRN

## 2013-07-13 MED ORDER — PHENOL 1.4 % MT LIQD: 1.0000 | OROMUCOSAL | Status: DC | PRN

## 2013-07-13 MED ORDER — ONDANSETRON HCL 4 MG/2ML IJ SOLN
INTRAMUSCULAR | Status: DC | PRN
  Administered 2013-07-13: 4 mg via INTRAVENOUS

## 2013-07-13 MED ORDER — EPHEDRINE SULFATE 50 MG/ML IJ SOLN
INTRAMUSCULAR | Status: DC | PRN
  Administered 2013-07-13: 10 mg via INTRAVENOUS

## 2013-07-13 MED ORDER — THROMBIN 5000 UNITS EX SOLR
CUTANEOUS | Status: DC | PRN
  Administered 2013-07-13 (×2): 5000 [IU] via TOPICAL

## 2013-07-13 MED ORDER — TRAZODONE HCL 150 MG PO TABS
150.0000 mg | ORAL_TABLET | Freq: Every day | ORAL | Status: DC
  Administered 2013-07-13: 150 mg via ORAL
  Filled 2013-07-13 (×2): qty 1

## 2013-07-13 MED ORDER — SODIUM CHLORIDE 0.9 % IJ SOLN
3.0000 mL | Freq: Two times a day (BID) | INTRAMUSCULAR | Status: DC
  Administered 2013-07-13: 3 mL via INTRAVENOUS

## 2013-07-13 MED ORDER — LACTATED RINGERS IV SOLN
INTRAVENOUS | Status: DC | PRN
  Administered 2013-07-13 (×2): via INTRAVENOUS

## 2013-07-13 MED ORDER — LIDOCAINE HCL (CARDIAC) 20 MG/ML IV SOLN
INTRAVENOUS | Status: DC | PRN
  Administered 2013-07-13: 100 mg via INTRAVENOUS

## 2013-07-13 MED ORDER — ROCURONIUM BROMIDE 100 MG/10ML IV SOLN
INTRAVENOUS | Status: DC | PRN
  Administered 2013-07-13: 50 mg via INTRAVENOUS

## 2013-07-13 MED ORDER — DIAZEPAM 5 MG PO TABS
5.0000 mg | ORAL_TABLET | Freq: Four times a day (QID) | ORAL | Status: DC | PRN
  Administered 2013-07-13 – 2013-07-14 (×2): 5 mg via ORAL
  Filled 2013-07-13 (×2): qty 1

## 2013-07-13 MED ORDER — ONDANSETRON HCL 4 MG/2ML IJ SOLN: 4.0000 mg | INTRAMUSCULAR | Status: DC | PRN

## 2013-07-13 MED ORDER — ACETAMINOPHEN 325 MG PO TABS: 650.0000 mg | ORAL_TABLET | ORAL | Status: DC | PRN

## 2013-07-13 MED ORDER — SCOPOLAMINE 1 MG/3DAYS TD PT72
MEDICATED_PATCH | TRANSDERMAL | Status: AC
  Administered 2013-07-13: 1 via TRANSDERMAL
  Filled 2013-07-13: qty 1

## 2013-07-13 MED ORDER — PANTOPRAZOLE SODIUM 40 MG PO TBEC: 40.0000 mg | DELAYED_RELEASE_TABLET | Freq: Every day | ORAL | Status: DC

## 2013-07-13 MED ORDER — ACETAMINOPHEN 650 MG RE SUPP: 650.0000 mg | RECTAL | Status: DC | PRN

## 2013-07-13 MED ORDER — MUPIROCIN 2 % EX OINT
TOPICAL_OINTMENT | Freq: Two times a day (BID) | CUTANEOUS | Status: DC
  Administered 2013-07-13: 22:00:00 via NASAL

## 2013-07-13 MED ORDER — POTASSIUM CHLORIDE IN NACL 20-0.9 MEQ/L-% IV SOLN
INTRAVENOUS | Status: DC
  Administered 2013-07-13: via INTRAVENOUS
  Filled 2013-07-13 (×3): qty 1000

## 2013-07-13 MED ORDER — NEOSTIGMINE METHYLSULFATE 1 MG/ML IJ SOLN
INTRAMUSCULAR | Status: DC | PRN
  Administered 2013-07-13: 3 mg via INTRAVENOUS

## 2013-07-13 MED ORDER — 0.9 % SODIUM CHLORIDE (POUR BTL) OPTIME
TOPICAL | Status: DC | PRN
  Administered 2013-07-13: 1000 mL

## 2013-07-13 MED ORDER — PREGABALIN 100 MG PO CAPS
300.0000 mg | ORAL_CAPSULE | Freq: Two times a day (BID) | ORAL | Status: DC
  Administered 2013-07-13: 300 mg via ORAL
  Filled 2013-07-13: qty 6

## 2013-07-13 MED ORDER — HYDROCODONE-ACETAMINOPHEN 5-325 MG PO TABS
1.0000 | ORAL_TABLET | ORAL | Status: DC | PRN
  Administered 2013-07-14 (×2): 2 via ORAL
  Filled 2013-07-13 (×2): qty 2

## 2013-07-13 MED ORDER — MENTHOL 3 MG MT LOZG: 1.0000 | LOZENGE | OROMUCOSAL | Status: DC | PRN

## 2013-07-13 MED ORDER — GLYCOPYRROLATE 0.2 MG/ML IJ SOLN
INTRAMUSCULAR | Status: DC | PRN
  Administered 2013-07-13: 0.4 mg via INTRAVENOUS

## 2013-07-13 MED ORDER — HYDROMORPHONE HCL PF 1 MG/ML IJ SOLN
0.2500 mg | INTRAMUSCULAR | Status: DC | PRN
  Administered 2013-07-13 (×4): 0.5 mg via INTRAVENOUS

## 2013-07-13 MED ORDER — KETOROLAC TROMETHAMINE 30 MG/ML IJ SOLN
30.0000 mg | Freq: Four times a day (QID) | INTRAMUSCULAR | Status: DC
  Administered 2013-07-13 – 2013-07-14 (×2): 30 mg via INTRAVENOUS
  Filled 2013-07-13 (×6): qty 1

## 2013-07-13 MED ORDER — CLONAZEPAM 0.5 MG PO TABS: 1.0000 mg | ORAL_TABLET | Freq: Two times a day (BID) | ORAL | Status: DC | PRN

## 2013-07-13 MED ORDER — HYDROMORPHONE HCL PF 1 MG/ML IJ SOLN
INTRAMUSCULAR | Status: AC
  Filled 2013-07-13: qty 1

## 2013-07-13 MED ORDER — SENNA 8.6 MG PO TABS
1.0000 | ORAL_TABLET | Freq: Two times a day (BID) | ORAL | Status: DC
  Administered 2013-07-13: 8.6 mg via ORAL
  Filled 2013-07-13 (×3): qty 1

## 2013-07-13 MED ORDER — OXYCODONE HCL 5 MG PO TABS
5.0000 mg | ORAL_TABLET | Freq: Once | ORAL | Status: AC | PRN
  Administered 2013-07-13: 5 mg via ORAL

## 2013-07-13 MED ORDER — FENTANYL CITRATE 0.05 MG/ML IJ SOLN
INTRAMUSCULAR | Status: DC | PRN
Start: 2013-07-13 — End: 2013-07-13
  Administered 2013-07-13: 50 ug via INTRAVENOUS
  Administered 2013-07-13 (×2): 100 ug via INTRAVENOUS

## 2013-07-13 MED ORDER — HYDROCODONE-ACETAMINOPHEN 10-325 MG PO TABS: 1.0000 | ORAL_TABLET | Freq: Four times a day (QID) | ORAL | Status: DC | PRN

## 2013-07-13 MED ORDER — HEMOSTATIC AGENTS (NO CHARGE) OPTIME
TOPICAL | Status: DC | PRN
  Administered 2013-07-13: 1 via TOPICAL

## 2013-07-13 MED ORDER — LAMOTRIGINE 100 MG PO TABS
100.0000 mg | ORAL_TABLET | Freq: Every day | ORAL | Status: DC
  Administered 2013-07-13: 100 mg via ORAL
  Filled 2013-07-13 (×2): qty 1

## 2013-07-13 MED ORDER — CEFAZOLIN SODIUM 1-5 GM-% IV SOLN
1.0000 g | Freq: Three times a day (TID) | INTRAVENOUS | Status: AC
  Administered 2013-07-13 – 2013-07-14 (×2): 1 g via INTRAVENOUS
  Filled 2013-07-13 (×2): qty 50

## 2013-07-13 MED ORDER — OXYCODONE-ACETAMINOPHEN 5-325 MG PO TABS: 1.0000 | ORAL_TABLET | ORAL | Status: DC | PRN

## 2013-07-13 MED ORDER — CHLORHEXIDINE GLUCONATE CLOTH 2 % EX PADS
6.0000 | MEDICATED_PAD | Freq: Every day | CUTANEOUS | Status: DC
  Administered 2013-07-14: 6 via TOPICAL

## 2013-07-13 MED ORDER — CYCLOBENZAPRINE HCL 10 MG PO TABS: 10.0000 mg | ORAL_TABLET | Freq: Three times a day (TID) | ORAL | Status: DC | PRN

## 2013-07-13 MED ORDER — LACTATED RINGERS IV SOLN
INTRAVENOUS | Status: DC
  Administered 2013-07-13: 13:00:00 via INTRAVENOUS

## 2013-07-13 MED ORDER — MORPHINE SULFATE 2 MG/ML IJ SOLN
1.0000 mg | INTRAMUSCULAR | Status: DC | PRN
  Administered 2013-07-13: 2 mg via INTRAVENOUS
  Filled 2013-07-13: qty 1

## 2013-07-13 MED ORDER — PROPOFOL 10 MG/ML IV BOLUS
INTRAVENOUS | Status: DC | PRN
  Administered 2013-07-13: 200 mg via INTRAVENOUS

## 2013-07-13 MED ORDER — IPRATROPIUM-ALBUTEROL 0.5-2.5 (3) MG/3ML IN SOLN: 3.0000 mL | Freq: Four times a day (QID) | RESPIRATORY_TRACT | Status: DC | PRN

## 2013-07-13 SURGICAL SUPPLY — 55 items
BAG DECANTER FOR FLEXI CONT (MISCELLANEOUS) ×3 IMPLANT
BENZOIN TINCTURE PRP APPL 2/3 (GAUZE/BANDAGES/DRESSINGS) IMPLANT
BLADE SURG ROTATE 9660 (MISCELLANEOUS) IMPLANT
BUR MATCHSTICK NEURO 3.0 LAGG (BURR) ×3 IMPLANT
CANISTER SUCT 3000ML (MISCELLANEOUS) ×3 IMPLANT
CLOSURE WOUND 1/2 X4 (GAUZE/BANDAGES/DRESSINGS)
CONT SPEC 4OZ CLIKSEAL STRL BL (MISCELLANEOUS) ×3 IMPLANT
DECANTER SPIKE VIAL GLASS SM (MISCELLANEOUS) ×3 IMPLANT
DERMABOND ADHESIVE PROPEN (GAUZE/BANDAGES/DRESSINGS) ×2
DERMABOND ADVANCED (GAUZE/BANDAGES/DRESSINGS)
DERMABOND ADVANCED .7 DNX12 (GAUZE/BANDAGES/DRESSINGS) IMPLANT
DERMABOND ADVANCED .7 DNX6 (GAUZE/BANDAGES/DRESSINGS) ×1 IMPLANT
DRAPE LAPAROTOMY 100X72X124 (DRAPES) ×3 IMPLANT
DRAPE MICROSCOPE LEICA (MISCELLANEOUS) ×3 IMPLANT
DRAPE POUCH INSTRU U-SHP 10X18 (DRAPES) ×3 IMPLANT
DRAPE SURG 17X23 STRL (DRAPES) ×3 IMPLANT
DURAPREP 26ML APPLICATOR (WOUND CARE) ×3 IMPLANT
ELECT REM PT RETURN 9FT ADLT (ELECTROSURGICAL) ×3
ELECTRODE REM PT RTRN 9FT ADLT (ELECTROSURGICAL) ×1 IMPLANT
GAUZE SPONGE 4X4 16PLY XRAY LF (GAUZE/BANDAGES/DRESSINGS) IMPLANT
GLOVE BIOGEL PI IND STRL 7.5 (GLOVE) ×1 IMPLANT
GLOVE BIOGEL PI INDICATOR 7.5 (GLOVE) ×2
GLOVE ECLIPSE 6.5 STRL STRAW (GLOVE) ×3 IMPLANT
GLOVE ECLIPSE 7.5 STRL STRAW (GLOVE) ×9 IMPLANT
GLOVE EXAM NITRILE LRG STRL (GLOVE) IMPLANT
GLOVE EXAM NITRILE MD LF STRL (GLOVE) IMPLANT
GLOVE EXAM NITRILE XL STR (GLOVE) IMPLANT
GLOVE EXAM NITRILE XS STR PU (GLOVE) IMPLANT
GOWN BRE IMP SLV AUR LG STRL (GOWN DISPOSABLE) IMPLANT
GOWN BRE IMP SLV AUR XL STRL (GOWN DISPOSABLE) IMPLANT
GOWN STRL REIN 2XL LVL4 (GOWN DISPOSABLE) IMPLANT
GOWN STRL REUS W/ TWL LRG LVL3 (GOWN DISPOSABLE) ×2 IMPLANT
GOWN STRL REUS W/ TWL XL LVL3 (GOWN DISPOSABLE) ×1 IMPLANT
GOWN STRL REUS W/TWL LRG LVL3 (GOWN DISPOSABLE) ×4
GOWN STRL REUS W/TWL XL LVL3 (GOWN DISPOSABLE) ×2
KIT BASIN OR (CUSTOM PROCEDURE TRAY) ×3 IMPLANT
KIT ROOM TURNOVER OR (KITS) ×3 IMPLANT
NEEDLE HYPO 25X1 1.5 SAFETY (NEEDLE) ×3 IMPLANT
NEEDLE SPNL 18GX3.5 QUINCKE PK (NEEDLE) IMPLANT
NS IRRIG 1000ML POUR BTL (IV SOLUTION) ×3 IMPLANT
PACK LAMINECTOMY NEURO (CUSTOM PROCEDURE TRAY) ×3 IMPLANT
PAD ARMBOARD 7.5X6 YLW CONV (MISCELLANEOUS) ×9 IMPLANT
RUBBERBAND STERILE (MISCELLANEOUS) ×6 IMPLANT
SPONGE GAUZE 4X4 12PLY (GAUZE/BANDAGES/DRESSINGS) IMPLANT
SPONGE LAP 4X18 X RAY DECT (DISPOSABLE) IMPLANT
SPONGE SURGIFOAM ABS GEL SZ50 (HEMOSTASIS) ×3 IMPLANT
STRIP CLOSURE SKIN 1/2X4 (GAUZE/BANDAGES/DRESSINGS) IMPLANT
SUT VIC AB 0 CT1 18XCR BRD8 (SUTURE) ×1 IMPLANT
SUT VIC AB 0 CT1 8-18 (SUTURE) ×2
SUT VIC AB 2-0 CT1 18 (SUTURE) ×3 IMPLANT
SUT VIC AB 3-0 SH 8-18 (SUTURE) ×3 IMPLANT
SYR 20ML ECCENTRIC (SYRINGE) ×3 IMPLANT
TOWEL OR 17X24 6PK STRL BLUE (TOWEL DISPOSABLE) ×3 IMPLANT
TOWEL OR 17X26 10 PK STRL BLUE (TOWEL DISPOSABLE) ×3 IMPLANT
WATER STERILE IRR 1000ML POUR (IV SOLUTION) ×3 IMPLANT

## 2013-07-13 NOTE — Preoperative (Signed)
Beta Blockers   Reason not to administer Beta Blockers:Not Applicable 

## 2013-07-13 NOTE — Discharge Summary (Signed)
Physician Discharge Summary  Patient ID: Rachael Ramirez MRN: [id number] DOB/AGE: 45/10/1970 45 y.o.  Admit date: 07/13/2013 Discharge date: 07/13/2013  Admission Diagnoses:Hnp L4/5 left  Discharge Diagnoses: Hnp Left L4/5 Active Problems:   HNP (herniated nucleus pulposus), lumbar   Discharged Condition: good  Hospital Course: Rachael Ramirez was admitted and taken to the operating room where she underwent an uncomplicated lumbar discetomy at L4/5 on the left. Post op her wound is clean, dry, without signs of infection. She, at discharge, is voiding, ambulating, and tolerating a regular diet. She has less pain in the left lower extremity than before the operation.   Consults: None  Significant Diagnostic Studies: none  Treatments: surgery: Left L4/5 semihemilaminectomy and discetomy with microdissection  Discharge Exam: Blood pressure 154/90, pulse 73, temperature 97.5 F (36.4 C), temperature source Oral, resp. rate 17, last menstrual period 07/06/2013, SpO2 100.00%. General appearance: alert, cooperative, appears stated age and no distress Neurologic: Alert and oriented X 3, normal strength and tone. Normal symmetric reflexes. Normal coordination and gait  Disposition: 01-Home or Self Care     Medication List         albuterol-ipratropium 18-103 MCG/ACT inhaler  Commonly known as:  COMBIVENT  Inhale 2 puffs into the lungs every 6 (six) hours as needed. For wheezing     clonazePAM 1 MG tablet  Commonly known as:  KLONOPIN  Take 1 mg by mouth 2 (two) times daily as needed. For anxiety     cyclobenzaprine 10 MG tablet  Commonly known as:  FLEXERIL  Take 1 tablet (10 mg total) by mouth 3 (three) times daily as needed for muscle spasms.     HYDROcodone-acetaminophen 10-325 MG per tablet  Commonly known as:  NORCO  Take 1 tablet by mouth every 6 (six) hours as needed for moderate pain.     HYDROcodone-acetaminophen 10-325 MG per tablet  Commonly known as:  NORCO  Take 1 tablet  by mouth every 6 (six) hours as needed for pain.     lamoTRIgine 100 MG tablet  Commonly known as:  LAMICTAL  Take 100 mg by mouth at bedtime.     omeprazole 20 MG capsule  Commonly known as:  PRILOSEC  Take 20 mg by mouth daily.     pregabalin 300 MG capsule  Commonly known as:  LYRICA  Take 300 mg by mouth 2 (two) times daily.     traZODone 100 MG tablet  Commonly known as:  DESYREL  Take 150 mg by mouth at bedtime.           Follow-up Information   Follow up with Princella Jaskiewicz L, MD In 4 weeks. (call to make an appointment)    Specialty:  Neurosurgery   Contact information:   [address]                         [address] [place] [id number] [phone]       Signed: Zakaree Mcclenahan L 07/13/2013, 9:30 PM

## 2013-07-13 NOTE — Op Note (Signed)
07/13/2013  5:31 PM  PATIENT:  Rachael Ramirez  43 y.o. female with paIN IN the left lower extremity unrelieved by conservative treatment and epidural injections.  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 was taken to the operating room, intubated and placed under a general anesthetic without difficulty. She was positioned prone on a Wilson frame with all pressure points padded. Her back was prepped and draped in a sterile manner. I opened the skin with a 10 blade and carried the dissection down to the thoracolumbar fascia. I used both sharp dissection and the monopolar cautery to expose the lamina of L3,4, and 5. I confirmed my location with an intraoperative xray.  I used the drill, Kerrison punches, and curettes to perform a semihemilaminectomy of L4. I used the punches to remove the ligamentum flavum to expose the thecal sac. I brought the microscope into the operative field and with Dr.Hirsch's assistance we started our decompression of the spinal canal, thecal sac and L5 root(s). I cauterized epidural veins overlying the disc space then divided them sharply. I opened the disc space with a 15 blade and proceeded with the discectomy. I used pituitary rongeurs, curettes, and other instruments to remove disc material. After the discectomy was completed we inspected the L5 nerve root and felt it was well decompressed. I explored rostrally, laterally, medially, and caudally and was satisfied with the decompression. I  irrigated the wound, then closed in layers. I approximated the thoracolumbar fascia, subcutaneous, and subcuticular planes with vicryl sutures. I used dermabond for a sterile dressing.   PLAN OF CARE: Admit for overnight observation  PATIENT DISPOSITION:  PACU - hemodynamically stable.   Delay start of Pharmacological VTE agent (>24hrs) due to surgical blood loss or risk of bleeding:  yes

## 2013-07-13 NOTE — Plan of Care (Signed)
Problem: Consults Goal: Diagnosis - Spinal Surgery Outcome: Completed/Met Date Met:  07/13/13 Microdiscectomy

## 2013-07-13 NOTE — Discharge Instructions (Addendum)
Lumbar Discectomy °Care After °A discectomy involves removal of discmaterial (the cartilage-like structures located between the bones of the back). It is done to relieve pressure on nerve roots. It can be used as a treatment for a back problem. The time in surgery depends on the findings in surgery and what is necessary to correct the problems. °HOME CARE INSTRUCTIONS  °· Check the cut (incision) made by the surgeon twice a day for signs of infection. Some signs of infection may include:  °· A foul smelling, greenish or yellowish discharge from the wound.  °· Increased pain.  °· Increased redness over the incision (operative) site.  °· The skin edges may separate.  °· Flu-like symptoms (problems).  °· A temperature above 101.5° F (38.6° C).  °· Change your bandages in about 24 to 36 hours following surgery or as directed.  °· You may shower tomrrow.  Avoid bathtubs, swimming pools and hot tubs for three weeks or until your incision has healed completely. °· Follow your doctor's instructions as to safe activities, exercises, and physical therapy.  °· Weight reduction may be beneficial if you are overweight.  °· Daily exercise is helpful to prevent the return of problems. Walking is permitted. You may use a treadmill without an incline. Cut down on activities and exercise if you have discomfort. You may also go up and down stairs as much as you can tolerate.  °· DO NOT lift anything heavier than 10 to 15 lbs. Avoid bending or twisting at the waist. Always bend your knees when lifting.  °· Maintain strength and range of motion as instructed.  °· Do not drive for 10 days, or as directed by your doctors. You may be a passenger . Lying back in the passenger seat may be more comfortable for you. Always wear a seatbelt.  °· Limit your sitting in a regular chair to 20 to 30 minutes at a time. There are no limitations for sitting in a recliner. You should lie down or walk in between sitting periods.  °· Only take  over-the-counter or prescription medicines for pain, discomfort, or fever as directed by your caregiver.  °SEEK MEDICAL CARE IF:  °· There is increased bleeding (more than a small spot) from the wound.  °· You notice redness, swelling, or increasing pain in the wound.  °· Pus is coming from wound.  °· You develop an unexplained oral temperature above 102° F (38.9° C) develops.  °· You notice a foul smell coming from the wound or dressing.  °· You have increasing pain in your wound.  °SEEK IMMEDIATE MEDICAL CARE IF:  °· You develop a rash.  °· You have difficulty breathing.  °· You develop any allergic problems to medicines given.  °Document Released: 05/19/2004 Document Revised: 06/03/2011 Document Reviewed: 09/07/2007 °ExitCare® Patient Information ° ° ° °Wound Care °Leave incision open to air. °You may shower. °Do not scrub directly on incision.  °Do not put any creams, lotions, or ointments on incision. °Activity °Walk each and every day, increasing distance each day. °No lifting greater than 5 lbs.  Avoid bending, arching, and twisting. °No driving for 2 weeks; may ride as a passenger locally. °If provided with back brace, wear when out of bed.  It is not necessary to wear in bed. °Diet °Resume your normal diet.  °Return to Work °Will be discussed at you follow up appointment. °Call Your Doctor If Any of These Occur °Redness, drainage, or swelling at the wound.  °Temperature greater than   101 degrees. °Severe pain not relieved by pain medication. °Incision starts to come apart. °Follow Up Appt °Call today for appointment in 4 weeks ([phone]) or for problems.  If you have any hardware placed in your spine, you will need an x-ray before your appointment. °

## 2013-07-13 NOTE — Anesthesia Postprocedure Evaluation (Signed)
  Anesthesia Post-op Note  Patient: Rachael Ramirez  Procedure(s) Performed: Procedure(s) with comments: Left Lumbar four-five microdiskectomy (Left) - Left Lumbar four-five microdiskectomy  Patient Location: PACU  Anesthesia Type:General  Level of Consciousness: awake, alert  and oriented  Airway and Oxygen Therapy: Patient Spontanous Breathing and Patient connected to nasal cannula oxygen  Post-op Pain: moderate  Post-op Assessment: Post-op Vital signs reviewed  Post-op Vital Signs: Reviewed  Complications: No apparent anesthesia complications

## 2013-07-13 NOTE — Anesthesia Procedure Notes (Signed)
Procedure Name: Intubation Date/Time: 07/13/2013 3:41 PM Performed by: Orvilla FusATO, Quantrell Splitt A Pre-anesthesia Checklist: Patient identified, Timeout performed, Emergency Drugs available, Suction available and Patient being monitored Patient Re-evaluated:Patient Re-evaluated prior to inductionOxygen Delivery Method: Circle system utilized Preoxygenation: Pre-oxygenation with 100% oxygen Intubation Type: IV induction Ventilation: Mask ventilation without difficulty Laryngoscope Size: Mac and 3 Grade View: Grade I Tube type: Oral Tube size: 7.0 mm Number of attempts: 1 Airway Equipment and Method: Stylet Placement Confirmation: ETT inserted through vocal cords under direct vision,  breath sounds checked- equal and bilateral and positive ETCO2 Secured at: 21 cm Tube secured with: Tape Dental Injury: Teeth and Oropharynx as per pre-operative assessment

## 2013-07-13 NOTE — H&P (Signed)
BP 137/92  Pulse 90  Temp(Src) 98.5 F (36.9 C) (Oral)  Resp 18  SpO2 97%  LMP 07/06/2013   HOPI:                                                   [name] returns today. She had been treated by Dr. Ollen BowlHarkins in the pain management section and had been essentially as she had always done. Then she had the acute onset of pain. An MRI was performed and what it shows is that she has a herniated disc above the level of her fusion eccentric to the left side. This, I do believe, is the reason for the pain that she presented with today.                           [name], on exam, looks quite cachectic, looks debilitated. Frankly, looks ill. She says that she has not been able to get her pain medication because she abused too much of it. She has spoken with Dr. Ollen BowlHarkins and that Dr. Ollen BowlHarkins stated that rules had been broken, that if she had not taken the medication when she says cannot take the medication she had been prescribed and needed another one, then she should have had some left over. She stated that she had thrown it away. Nevertheless, Dr. Ollen BowlHarkins simply stated when I spoke to him, that she simply needed to produce the bottle. He has had previous problems with [name] and pain medication in the past and saw no reason to bend.                                       Dr. Ollen BowlHarkins on 06/14/2013 had actually increased her Percocet strength, started her on Lyrica, and also gave her some prednisone to try to get her through to my appointment to see me.   I gave her a new prescription for hydrocodone 10/325 and I gave her 70 tablets.   ALLERGIES:                                         She has no known allergies, but says that she is intolerant to Percocet.   MEDICATIONS:                         She takes clonazepam, Combivent, Lamictal, trazodone.   REVIEW OF SYSTEMS:                        She has reported extremity weakness, gait problems, headache, numbness in her extremities, anxiety, depression,  insomnia, back pain, muscle weakness, seasonal allergies, fatigue, weight loss, decreased appetite, nausea, chronic cough, dyspnea, and wheezing.   PHYSICAL EXAMINATION:                    Vital signs: Height 62 inches, weight 102.4 pounds, BMI 18.73, blood pressure 132/83, pulse 99. She is also finishing a Sterapred Dosepak.   On examination, she is alert, oriented x4, and answering all questions appropriately. Memory, language, attention span and  fund of knowledge are normal. She is well-kempt and in no distress. She has 5/5 strength in upper extremities. She has some give way weakness in the lower extremities. Gait is antalgic. She walks slumped over to some degree. Reflexes are not elicited at the knees or ankles. Negative Romberg test.   IMAGING:                                             [name] on MRI has a fairly large disc herniation just above the level of her fusion at L4-5. It is eccentric to the left side.   IMPRESSION/PLAN:                             We discussed surgery as she had surgery before when I did diffuse and then remove the screws. We have discussed this again. I do not think that there is any reasonable alternative. She has undergone injections, which did not help. After Dr. Ollen Bowl saw this MRI, he did not feel that any more injections would be of use and that surgery would be the next step forward. [name] understands and wishes to proceed. She was given a detailed instruction sheet and has had the surgery before.

## 2013-07-13 NOTE — Anesthesia Preprocedure Evaluation (Signed)
Anesthesia Evaluation  Patient identified by MRN, date of birth, ID band Patient awake    Reviewed: Allergy & Precautions, H&P , NPO status , Patient's Chart, lab work & pertinent test results  History of Anesthesia Complications (+) PONV  Airway Mallampati: I TM Distance: >3 FB Neck ROM: Full    Dental  (+) Edentulous Upper, Edentulous Lower and Dental Advisory Given   Pulmonary asthma , Current Smoker,  breath sounds clear to auscultation        Cardiovascular Rhythm:Regular Rate:Normal     Neuro/Psych    GI/Hepatic GERD-  Medicated and Controlled,  Endo/Other    Renal/GU      Musculoskeletal   Abdominal   Peds  Hematology   Anesthesia Other Findings   Reproductive/Obstetrics                           Anesthesia Physical Anesthesia Plan  ASA: II  Anesthesia Plan: General   Post-op Pain Management:    Induction: Intravenous  Airway Management Planned: Oral ETT  Additional Equipment:   Intra-op Plan:   Post-operative Plan: Extubation in OR  Informed Consent: I have reviewed the patients History and Physical, chart, labs and discussed the procedure including the risks, benefits and alternatives for the proposed anesthesia with the patient or authorized representative who has indicated his/her understanding and acceptance.   Dental advisory given  Plan Discussed with: CRNA, Anesthesiologist and Surgeon  Anesthesia Plan Comments:         Anesthesia Quick Evaluation

## 2013-07-13 NOTE — Transfer of Care (Signed)
Immediate Anesthesia Transfer of Care Note  Patient: Rachael Ramirez  Procedure(s) Performed: Procedure(s) with comments: Left Lumbar four-five microdiskectomy (Left) - Left Lumbar four-five microdiskectomy  Patient Location: PACU  Anesthesia Type:General  Level of Consciousness: awake, alert  and oriented  Airway & Oxygen Therapy: Patient Spontanous Breathing and Patient connected to face mask oxygen  Post-op Assessment: Report given to PACU RN, Post -op Vital signs reviewed and stable and Patient moving all extremities  Post vital signs: Reviewed and stable  Complications: No apparent anesthesia complications

## 2013-07-14 NOTE — Progress Notes (Signed)
  Pt. Alert and oriented,follows simple instructions, denies pain. Incision area without swelling, redness or S/S of infection. Voiding adequate clear yellow urine. Moving all extremities well and vitals stable and documented. Patient discharged home with spouse. Lumbar surgery notes instructions given to patient and family member for home safety and precautions. Pt. and family stated understanding of instructions given.  

## 2013-07-14 NOTE — Progress Notes (Signed)
Subjective: Patient reports doing well  Objective: Vital signs in last 24 hours: Temp:  [97.5 F (36.4 C)-98.6 F (37 C)] 98.5 F (36.9 C) (01/17 0814) Pulse Rate:  [62-90] 69 (01/17 0814) Resp:  [9-27] 18 (01/17 0814) BP: (133-160)/(76-92) 135/81 mmHg (01/17 0814) SpO2:  [93 %-100 %] 94 % (01/17 0814)  Intake/Output from previous day: 01/16 0701 - 01/17 0700 In: 1200 [I.V.:1200] Out: 500 [Urine:500] Intake/Output this shift:    Physical Exam: Full strength.  Dressing CDI  Lab Results:  Recent Labs  07/11/13 1228  WBC 7.3  HGB 14.6  HCT 42.7  PLT 277   BMET  Recent Labs  07/11/13 1228  NA 140  K 3.4*  CL 101  CO2 27  GLUCOSE 83  BUN 4*  CREATININE 0.67  CALCIUM 9.2    Studies/Results: Dg Lumbar Spine 2-3 Views  07/13/2013   CLINICAL DATA:  L4-5 microdiscectomy.  EXAM: LUMBAR SPINE - 2-3 VIEW  COMPARISON:  DG LUMBAR SPINE 2-3V dated 07/05/2013; MR LUMBAR SPINE WO/W CM dated 05/17/2013  FINDINGS: Numbering is based on prior MRI. Cross-table lateral view of the lumbar spine labeled #1 at 1615 hr demonstrates prior L5-S1 fusion and a localizing needle overlying the L4 spinous process. Superior endplate compression deformity at L1 appears unchanged.  Image number 2 at 1615 hr demonstrates skin spreaders posteriorly at L4. A blunt surgical instrument is directed towards the L3-4 facet joints.  IMPRESSION: Intraoperative localization as described.   Electronically Signed   By: Roxy HorsemanBill  Veazey M.D.   On: 07/13/2013 17:59    Assessment/Plan: Discharge home    LOS: 1 day    Huyen Perazzo D, MD 07/14/2013, 8:41 AM

## 2013-07-17 ENCOUNTER — Encounter (HOSPITAL_COMMUNITY): Payer: Self-pay | Admitting: Neurosurgery

## 2013-08-20 ENCOUNTER — Encounter: Payer: Self-pay | Admitting: Neurology

## 2013-08-21 ENCOUNTER — Telehealth: Payer: Self-pay | Admitting: Neurology

## 2013-08-21 NOTE — Telephone Encounter (Signed)
Patient is coming in as a new patient of Dr. Anne HahnWillis tomorrow morning and she states that she had surgery on her back and that she scratched the incision site and took her scab off and she states that "infection started pouring out of it." Patient is wondering if she still needs to come in or if she needs to see someone else. Please call and advise.

## 2013-08-21 NOTE — Telephone Encounter (Signed)
I called patient. The patient has had 5 back surgeries, the most recent one in January 2015. The patient has swelling in the back, with pus draining from the back yesterday. The patient may require another MRI of the back, the patient apparently does not want to see Dr. Franky Machoabbell again, but she may need to make an appointment. I'll see the patient in the office tomorrow morning.

## 2013-08-22 ENCOUNTER — Ambulatory Visit (INDEPENDENT_AMBULATORY_CARE_PROVIDER_SITE_OTHER): Payer: Managed Care, Other (non HMO) | Admitting: Neurology

## 2013-08-22 ENCOUNTER — Telehealth: Payer: Self-pay | Admitting: Neurology

## 2013-08-22 ENCOUNTER — Encounter: Payer: Self-pay | Admitting: Neurology

## 2013-08-22 ENCOUNTER — Encounter (INDEPENDENT_AMBULATORY_CARE_PROVIDER_SITE_OTHER): Payer: Self-pay

## 2013-08-22 VITALS — BP 135/90 | HR 115 | Ht 63.5 in | Wt 106.0 lb

## 2013-08-22 DIAGNOSIS — M545 Low back pain, unspecified: Secondary | ICD-10-CM | POA: Insufficient documentation

## 2013-08-22 DIAGNOSIS — G8929 Other chronic pain: Secondary | ICD-10-CM | POA: Insufficient documentation

## 2013-08-22 NOTE — Progress Notes (Signed)
Reason for visit: Back pain  Rachael Ramirez is a 43 y.o. female  History of present illness:  Rachael Ramirez is a 43 year old right-handed white female with a history of lumbosacral spine disease. The patient has had a prior lumbosacral spine surgery, but in the fall of 2014, the patient developed recurring back pain, and discomfort and numbness down the left leg. The patient had some slight weakness of the leg, and she was falling. The patient was found to have an L4-5 herniated disc, off to the left. The patient required surgery on 07/13/2013. For 2 weeks following the surgery, the patient appeared to feel much better. However, the patient has begun to have increasing back pain, and aching pains down the left leg into the mid calf level. The patient feels as if the left leg is weak, and she has fallen on occasion. The patient has noted some night sweats. Within the last day or 2, the patient has had some drainage from the surgical site which she indicates has been profuse. The patient has tenderness around the surgical site. The patient has had no problems controlling the bowels or bladder, but she has difficulty having a bowel movement because of severe back pain. The patient is taking hydrocodone for pain. The patient has a generalized sense of malaise. The patient denies any neck pain or pain down the arms. The patient denies any issues involving the right leg. The patient requires assistance with dressing as she cannot bend over to put her socks and shoes on. The patient has not let her neurosurgeon know that she is having increasing pain.  Past Medical History  Diagnosis Date  . Back pain   . Asthma     uses Combivent daily as needed  . Insomnia     takes Trazodone nightly  . Anxiety     takes Klonopin daily as needed  . PONV (postoperative nausea and vomiting)   . History of bronchitis   . Cough   . Spinal headache   . Weakness     left leg  . Chronic back pain     HNP and  Radiculopathy  . GERD (gastroesophageal reflux disease)     takes Omeprazole daily  . Urinary urgency   . Bipolar 1 disorder     takes Lamictal daily  . Chronic low back pain 08/22/2013  . MRSA (methicillin resistant Staphylococcus aureus)     Postive nasal swab    Past Surgical History  Procedure Laterality Date  . Hand surgery Right 1992    has pins fracture  . Spinal fusion  1996  . Left arm surgery  1975    left elbow fracture  . Redo left arm surgery  2002  . Cervical fusion    . Back surgery      x 4 one with fusion  . Harware removal from back    . Epidural block injection    . Lumbar laminectomy/decompression microdiscectomy Left 07/13/2013    Procedure: Left Lumbar four-five microdiskectomy;  Surgeon: Carmela HurtKyle L Cabbell, MD;  Location: [facility];  Service: Neurosurgery;  Laterality: Left;  Left Lumbar four-five microdiskectomy    Family History  Problem Relation Age of Onset  . Multiple sclerosis Mother   . COPD Father   . Alcohol abuse Maternal Grandfather   . Alcohol abuse Brother   . Cirrhosis Brother     Social history:  reports that she has been smoking.  She has never used smokeless tobacco.  She reports that she does not drink alcohol or use illicit drugs.  Medications:  Current Outpatient Prescriptions on File Prior to Visit  Medication Sig Dispense Refill  . albuterol-ipratropium (COMBIVENT) 18-103 MCG/ACT inhaler Inhale 2 puffs into the lungs every 6 (six) hours as needed. For wheezing       . clonazePAM (KLONOPIN) 1 MG tablet Take 1 mg by mouth 2 (two) times daily as needed. For anxiety      . cyclobenzaprine (FLEXERIL) 10 MG tablet Take 1 tablet (10 mg total) by mouth 3 (three) times daily as needed for muscle spasms.  45 tablet  0  . HYDROcodone-acetaminophen (NORCO) 10-325 MG per tablet Take 1 tablet by mouth every 6 (six) hours as needed for moderate pain.  70 tablet  0  . lamoTRIgine (LAMICTAL) 100 MG tablet Take 100 mg by mouth at bedtime.       Marland Kitchen  omeprazole (PRILOSEC) 20 MG capsule Take 20 mg by mouth daily.      . pregabalin (LYRICA) 300 MG capsule Take 300 mg by mouth 2 (two) times daily.      . traZODone (DESYREL) 100 MG tablet Take 150 mg by mouth at bedtime.        No current facility-administered medications on file prior to visit.     No Known Allergies  ROS:  Out of a complete 14 system review of symptoms, the patient complains only of the following symptoms, and all other reviewed systems are negative.  Chills, weight loss, fatigue Spinning sensations Itching Shortness of breath, wheezing Feeling hot, cold Joint pain, joint swelling, muscle cramps, achy muscles Numbness, weakness, dizziness Depression, anxiety, insomnia, change in appetite, disinterest in activities, suicidal thoughts, racing thoughts  Blood pressure 135/90, pulse 115, height 5' 3.5" (1.613 m), weight 106 lb (48.081 kg).  Physical Exam  General: The patient is alert and cooperative at the time of the examination.  Eyes: Pupils are equal, round, and reactive to light. Discs are flat bilaterally.  Neck: The neck is supple, no carotid bruits are noted.  Respiratory: The respiratory examination is clear.  Cardiovascular: The cardiovascular examination reveals a regular rate and rhythm, no obvious murmurs or rubs are noted.  Neuromuscular: The patient has some swelling around the surgical site, significant pain with palpation of the surgical site. No pustular drainage was noted. The patient has significant back pain with internal and external rotation of the hip on the left.  Skin: Extremities are without significant edema.  Neurologic Exam  Mental status: The patient is alert and oriented x 3 at the time of the examination. The patient has apparent normal recent and remote memory, with an apparently normal attention span and concentration ability.  Cranial nerves: Facial symmetry is present. There is good sensation of the face to pinprick and  soft touch bilaterally. The strength of the facial muscles and the muscles to head turning and shoulder shrug are normal bilaterally. Speech is well enunciated, no aphasia or dysarthria is noted. Extraocular movements are full. Visual fields are full. The tongue is midline, and the patient has symmetric elevation of the soft palate. No obvious hearing deficits are noted.  Motor: The motor testing reveals 5 over 5 strength of all 4 extremities, but the patient appears to have a lot of giveaway weakness involving the entirety of the left leg secondary to discomfort.Peri Jefferson symmetric motor tone is noted throughout.  Sensory: Sensory testing is intact to pinprick, soft touch, vibration sensation, and position sense on all 4  extremities. No evidence of extinction is noted.  Coordination: Cerebellar testing reveals good finger-nose-finger and heel-to-shin bilaterally.  Gait and station: The patient has a limping type gait on the left leg. Tandem gait was unsteady. The patient was unable to walk on heels or toes secondary to discomfort. Romberg is negative. No drift is seen.  Reflexes: Deep tendon reflexes are symmetric and normal bilaterally, with exception that the left knee jerk reflex is absent.. Toes are downgoing bilaterally.   MRI lumbosacral spine 12/14/2011:  IMPRESSION:  1. 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  2. Postsurgical changes at L5-S1 with no new or residual nerve  impingement.  3. Old compression fracture of L1 with no neural impingement    Assessment/Plan:  1. Low back pain, recent lumbosacral spine surgery  The patient has had increased low back pain, left leg discomfort following surgery. The patient reports malaise, chills, and pustular drainage from the surgical site. The patient is generally is feeling poorly. The patient could potentially have a postoperative abscess, and this needs to be evaluated. The pain level has become quite significant at this point. The  patient will be set up for blood work today and she will have MRI evaluation of the lumbosacral spine with and without gadolinium. The patient will followup in 3-4 months. The patient will need to see Dr. Franky Macho in the near future. I will contact his office.  Addendum: I called the office of Dr. Franky Macho, and I indicated that the patient needs to be seen. They have offered a revisit tomorrow at 12 noon. I have relayed this to the patient.  Marlan Palau MD 08/22/2013 8:20 PM  [facility] [address] [place], [place] [address]  Phone [phone] Fax [phone]

## 2013-08-22 NOTE — Telephone Encounter (Signed)
Patient calling stating that her surgeon office will not be open tomorrow, patient upset and do not know what to do. Patient states that her incision is still draining. Please advise.

## 2013-08-22 NOTE — Telephone Encounter (Signed)
I called patient. The office of Dr. Franky Machoabbell may be closed tomorrow secondary to the weather. The patient may require another revisit sometime soon, but the office should call her about this..Marland Kitchen

## 2013-08-22 NOTE — Telephone Encounter (Signed)
Patient called to state that Dr. Anne HahnWillis had called her surgeon about her back surgery incision site being infected and got her an appointment for tomorrow. Patient called the office and states that they told her they probably won't be open tomorrow. Patient is very upset and does not know what to do, states her incision site is still draining. Please call and advise.

## 2013-08-23 ENCOUNTER — Emergency Department (HOSPITAL_COMMUNITY): Payer: Managed Care, Other (non HMO)

## 2013-08-23 ENCOUNTER — Emergency Department (HOSPITAL_COMMUNITY)
Admission: EM | Admit: 2013-08-23 | Discharge: 2013-08-23 | Disposition: A | Discharge status: Home / Self Care | Payer: Managed Care, Other (non HMO) | Attending: Emergency Medicine | Admitting: Emergency Medicine

## 2013-08-23 ENCOUNTER — Encounter (HOSPITAL_COMMUNITY): Payer: Self-pay | Admitting: Emergency Medicine

## 2013-08-23 DIAGNOSIS — IMO0002 Reserved for concepts with insufficient information to code with codable children: Secondary | ICD-10-CM | POA: Insufficient documentation

## 2013-08-23 DIAGNOSIS — F17200 Nicotine dependence, unspecified, uncomplicated: Secondary | ICD-10-CM | POA: Insufficient documentation

## 2013-08-23 DIAGNOSIS — G8918 Other acute postprocedural pain: Secondary | ICD-10-CM

## 2013-08-23 DIAGNOSIS — Z8614 Personal history of Methicillin resistant Staphylococcus aureus infection: Secondary | ICD-10-CM | POA: Insufficient documentation

## 2013-08-23 DIAGNOSIS — Z79899 Other long term (current) drug therapy: Secondary | ICD-10-CM | POA: Insufficient documentation

## 2013-08-23 DIAGNOSIS — F319 Bipolar disorder, unspecified: Secondary | ICD-10-CM | POA: Insufficient documentation

## 2013-08-23 DIAGNOSIS — J45909 Unspecified asthma, uncomplicated: Secondary | ICD-10-CM | POA: Insufficient documentation

## 2013-08-23 DIAGNOSIS — Y838 Other surgical procedures as the cause of abnormal reaction of the patient, or of later complication, without mention of misadventure at the time of the procedure: Secondary | ICD-10-CM | POA: Insufficient documentation

## 2013-08-23 DIAGNOSIS — R5383 Other fatigue: Secondary | ICD-10-CM

## 2013-08-23 DIAGNOSIS — F411 Generalized anxiety disorder: Secondary | ICD-10-CM | POA: Insufficient documentation

## 2013-08-23 DIAGNOSIS — K219 Gastro-esophageal reflux disease without esophagitis: Secondary | ICD-10-CM | POA: Insufficient documentation

## 2013-08-23 DIAGNOSIS — G4700 Insomnia, unspecified: Secondary | ICD-10-CM | POA: Insufficient documentation

## 2013-08-23 DIAGNOSIS — R5381 Other malaise: Secondary | ICD-10-CM | POA: Insufficient documentation

## 2013-08-23 DIAGNOSIS — G8929 Other chronic pain: Secondary | ICD-10-CM | POA: Insufficient documentation

## 2013-08-23 LAB — CBC WITH DIFFERENTIAL
BASOS: 0 %
Basophils Absolute: 0 10*3/uL (ref 0.0–0.2)
Eos: 1 %
Eosinophils Absolute: 0.1 10*3/uL (ref 0.0–0.4)
HCT: 36.2 % (ref 34.0–46.6)
HEMOGLOBIN: 12.8 g/dL (ref 11.1–15.9)
LYMPHS ABS: 1.8 10*3/uL (ref 0.7–3.1)
Lymphs: 24 %
MCH: 34.4 pg — AB (ref 26.6–33.0)
MCHC: 35.4 g/dL (ref 31.5–35.7)
MCV: 97 fL (ref 79–97)
Monocytes Absolute: 0.4 10*3/uL (ref 0.1–0.9)
Monocytes: 6 %
Neutrophils Absolute: 5.1 10*3/uL (ref 1.4–7.0)
Neutrophils Relative %: 69 %
PLATELETS: 368 10*3/uL (ref 150–379)
RBC: 3.72 x10E6/uL — ABNORMAL LOW (ref 3.77–5.28)
RDW: 13.5 % (ref 12.3–15.4)
WBC: 7.3 10*3/uL (ref 3.4–10.8)

## 2013-08-23 LAB — BASIC METABOLIC PANEL
BUN: 5 mg/dL — AB (ref 6–23)
CALCIUM: 8.9 mg/dL (ref 8.4–10.5)
CO2: 28 mEq/L (ref 19–32)
CREATININE: 0.59 mg/dL (ref 0.50–1.10)
Chloride: 102 mEq/L (ref 96–112)
GFR calc non Af Amer: >90 mL/min (ref >90)
Glucose, Bld: 87 mg/dL (ref 70–99)
Potassium: 3.8 mEq/L (ref 3.7–5.3)
Sodium: 141 mEq/L (ref 137–147)

## 2013-08-23 LAB — COMPREHENSIVE METABOLIC PANEL
ALK PHOS: 132 IU/L — AB (ref 39–117)
ALT: 4 IU/L (ref 0–32)
AST: 12 IU/L (ref 0–40)
Albumin/Globulin Ratio: 1.3 (ref 1.1–2.5)
Albumin: 3.3 g/dL — ABNORMAL LOW (ref 3.5–5.5)
BUN / CREAT RATIO: 7 — AB (ref 9–23)
BUN: 4 mg/dL — ABNORMAL LOW (ref 6–24)
CO2: 30 mmol/L — AB (ref 18–29)
CREATININE: 0.61 mg/dL (ref 0.57–1.00)
Calcium: 9.1 mg/dL (ref 8.7–10.2)
Chloride: 101 mmol/L (ref 96–108)
GFR calc non Af Amer: 111 mL/min/{1.73_m2} (ref >59)
GFR, EST AFRICAN AMERICAN: 128 mL/min/{1.73_m2} (ref >59)
Globulin, Total: 2.5 g/dL (ref 1.5–4.5)
Glucose: 80 mg/dL (ref 65–99)
Potassium: 3.6 mmol/L (ref 3.5–5.2)
Sodium: 140 mmol/L (ref 134–144)
Total Bilirubin: 0.2 mg/dL (ref 0.0–1.2)
Total Protein: 5.8 g/dL — ABNORMAL LOW (ref 6.0–8.5)

## 2013-08-23 LAB — CBC WITH DIFFERENTIAL/PLATELET
BASOS ABS: 0 10*3/uL (ref 0.0–0.1)
BASOS PCT: 0 % (ref 0–1)
Eosinophils Absolute: 0 10*3/uL (ref 0.0–0.7)
Eosinophils Relative: 1 % (ref 0–5)
HEMATOCRIT: 36.6 % (ref 36.0–46.0)
Hemoglobin: 12.5 g/dL (ref 12.0–15.0)
Lymphocytes Relative: 26 % (ref 12–46)
Lymphs Abs: 1.2 10*3/uL (ref 0.7–4.0)
MCH: 35.3 pg — ABNORMAL HIGH (ref 26.0–34.0)
MCHC: 34.2 g/dL (ref 30.0–36.0)
MCV: 103.4 fL — AB (ref 78.0–100.0)
MONO ABS: 0.5 10*3/uL (ref 0.1–1.0)
MONOS PCT: 10 % (ref 3–12)
Neutro Abs: 3 10*3/uL (ref 1.7–7.7)
Neutrophils Relative %: 64 % (ref 43–77)
Platelets: 362 10*3/uL (ref 150–400)
RBC: 3.54 MIL/uL — ABNORMAL LOW (ref 3.87–5.11)
RDW: 14.2 % (ref 11.5–15.5)
WBC: 4.8 10*3/uL (ref 4.0–10.5)

## 2013-08-23 LAB — SEDIMENTATION RATE
Sed Rate: 39 mm/hr — ABNORMAL HIGH (ref 0–32)
Sed Rate: 37 mm/hr — ABNORMAL HIGH (ref 0–22)

## 2013-08-23 LAB — C-REACTIVE PROTEIN: CRP: <0.5 mg/dL — ABNORMAL LOW (ref <0.60)

## 2013-08-23 MED ORDER — GADOBENATE DIMEGLUMINE 529 MG/ML IV SOLN
10.0000 mL | Freq: Once | INTRAVENOUS | Status: AC
  Administered 2013-08-23: 9 mL via INTRAVENOUS

## 2013-08-23 MED ORDER — HYDROCODONE-ACETAMINOPHEN 10-325 MG PO TABS: 1.0000 | ORAL_TABLET | Freq: Four times a day (QID) | ORAL | Status: DC | PRN

## 2013-08-23 MED ORDER — HYDROMORPHONE HCL PF 1 MG/ML IJ SOLN
1.0000 mg | Freq: Once | INTRAMUSCULAR | Status: AC
  Administered 2013-08-23: 1 mg via INTRAVENOUS
  Filled 2013-08-23: qty 1

## 2013-08-23 NOTE — ED Notes (Signed)
Pt presents with 4 day h/o drainage from surgical site from discectomy on 1/16.  Pt reports she went to Dr. Anne HahnWillis yesterday and was told she had an abscess to site that would have to be surgically debrided.  Pt reports pain worsened today, reports running out of pain medication today (taking hydrocodone/apap 10/325mg ).

## 2013-08-23 NOTE — Discharge Instructions (Signed)
Laminectomy - Laminotomy - Diskectomy, Care After  Refer to this sheet in the next few weeks. These instructions provide you with information on caring for yourself after your procedure. Your health care provider may also give you more specific instructions. Your treatment has been planned according to current medical practices, but problems sometimes occur. Call your health care provider if you have any problems or questions after your procedure.  WHAT TO EXPECT AFTER THE PROCEDURE  After your procedure, it is typical to have the following sensations:  · You may experience some pain around the incision area.  HOME CARE INSTRUCTIONS  · Check the incision made by the surgeon twice a day.  · Change your bandages in about 2 days following surgery or as directed.  · You may shower once the bandage is removed or as directed. Avoid bathtubs, swimming pools, and hot tubs for 3 weeks or until your incision has healed completely. If you have stitches or staples, they will be removed 2 3 weeks after surgery or as directed by your health care provider.  · Follow your health care provider's instructions as to activities, exercises, and physical therapy.  · Walking is permitted. You may use a treadmill without an incline. Cut down on activities if you have discomfort. You may also go up and down stairs as tolerated.  · Do not lift anything heavier than 10 15 lbs. Avoid bending or twisting at the waist. Always bend your knees.  · No driving is permitted for 2 weeks, or as directed by your doctor. You may be a passenger for 20 30 minute trips. Lying back in the passenger seat may be more comfortable for you.  · Limit your sitting to 20 30 minute intervals. You should lie down or walk in between sitting periods. There are no limitations for sitting in a recliner chair.  · Only take over-the-counter or prescription medicines for pain, discomfort, or fever as directed by your health care provider.  SEEK MEDICAL CARE IF:   · You have  increased bleeding from the wound.  · You notice redness, swelling, or increasing pain in the wound.  · Pus is coming from wound.  · You have chills or a fever.  · You notice a foul smell coming from the wound or dressing.  SEEK IMMEDIATE MEDICAL CARE IF:   · You develop a rash.  · You develop any reaction or side effects to medicines given.  · You develop bleeding or drainage from the wound following surgery.  · You develop dizzy episodes or fainting while standing or develop shortness of breath or difficulty breathing.  · You develop loss of bowel or bladder control.  · You develop weakness or cannot use your legs.  Document Released: 01/01/2005 Document Revised: 04/04/2013 Document Reviewed: 01/11/2013  [employer]® Patient Information ©2014 [employer].

## 2013-08-23 NOTE — ED Notes (Signed)
Ortho notified to apply TLSO brace.

## 2013-08-23 NOTE — ED Provider Notes (Signed)
CSN: [id number]     Arrival date & time 08/23/13  1419 History   First MD Initiated Contact with Patient 08/23/13 1505     Chief Complaint  Patient presents with  . Back Pain     (Consider location/radiation/quality/duration/timing/severity/associated sxs/prior Treatment) Patient is a 43 y.o. female presenting with back pain.  Back Pain Location:  Lumbar spine Quality:  Aching and shooting Radiates to:  L posterior upper leg Pain severity:  Severe Pain is:  Same all the time Onset quality:  Gradual Duration:  3 weeks (chronic, worse for about 3 weeks) Timing:  Constant Progression:  Worsening Chronicity:  Chronic Context comment:  Lumbar laminectomy/decompression microdiscectomy  on 07/13/13 Relieved by:  Nothing Exacerbated by: sitting up, laying down. Ineffective treatments:  Narcotics, OTC medications, being still, bed rest and lying down Associated symptoms: weakness   Associated symptoms: no abdominal pain, no abdominal swelling, no bladder incontinence, no bowel incontinence, no chest pain, no dysuria, no fever, no headaches, no numbness, no pelvic pain and no perianal numbness   Associated symptoms comment:  4 days ago had large amount of yellow drainage from surgical site with increased swelling of the site.  Weakness:    Duration: Chronically prior to surgery on 1/16, but gradually worsening for about 3 weeks.   Onset quality:  Gradual   Chronicity:  Chronic   Timing:  Intermittent   Progression:  Waxing and waning   Past Medical History  Diagnosis Date  . Back pain   . Asthma     uses Combivent daily as needed  . Insomnia     takes Trazodone nightly  . Anxiety     takes Klonopin daily as needed  . PONV (postoperative nausea and vomiting)   . History of bronchitis   . Cough   . Spinal headache   . Weakness     left leg  . Chronic back pain     HNP and Radiculopathy  . GERD (gastroesophageal reflux disease)     takes Omeprazole daily  . Urinary urgency     . Bipolar 1 disorder     takes Lamictal daily  . Chronic low back pain 08/22/2013  . MRSA (methicillin resistant Staphylococcus aureus)     Postive nasal swab   Past Surgical History  Procedure Laterality Date  . Hand surgery Right 1992    has pins fracture  . Spinal fusion  1996  . Left arm surgery  1975    left elbow fracture  . Redo left arm surgery  2002  . Cervical fusion    . Back surgery      x 4 one with fusion  . Harware removal from back    . Epidural block injection    . Lumbar laminectomy/decompression microdiscectomy Left 07/13/2013    Procedure: Left Lumbar four-five microdiskectomy;  Surgeon: Winfield Cunas, MD;  Location: [facility];  Service: Neurosurgery;  Laterality: Left;  Left Lumbar four-five microdiskectomy   Family History  Problem Relation Age of Onset  . Multiple sclerosis Mother   . COPD Father   . Alcohol abuse Maternal Grandfather   . Alcohol abuse Brother   . Cirrhosis Brother    History  Substance Use Topics  . Smoking status: Current Every Day Smoker -- 1.00 packs/day for 18 years  . Smokeless tobacco: Never Used  . Alcohol Use: No   OB History   Grav Para Term Preterm Abortions TAB SAB Ect Mult Living  Review of Systems  Constitutional: Negative for fever, chills, diaphoresis, activity change, appetite change and fatigue.  HENT: Negative for congestion, facial swelling, rhinorrhea and sore throat.   Eyes: Negative for photophobia and discharge.  Respiratory: Negative for cough, chest tightness and shortness of breath.   Cardiovascular: Negative for chest pain, palpitations and leg swelling.  Gastrointestinal: Negative for nausea, vomiting, abdominal pain, diarrhea and bowel incontinence.  Endocrine: Negative for polydipsia and polyuria.  Genitourinary: Negative for bladder incontinence, dysuria, frequency, difficulty urinating and pelvic pain.  Musculoskeletal: Positive for back pain. Negative for arthralgias, neck  pain and neck stiffness.  Skin: Negative for color change and wound.  Allergic/Immunologic: Negative for immunocompromised state.  Neurological: Positive for weakness. Negative for facial asymmetry, numbness and headaches.  Hematological: Does not bruise/bleed easily.  Psychiatric/Behavioral: Negative for confusion and agitation.      Allergies  Review of patient's allergies indicates no known allergies.  Home Medications   Current Outpatient Rx  Name  Route  Sig  Dispense  Refill  . albuterol-ipratropium (COMBIVENT) 18-103 MCG/ACT inhaler   Inhalation   Inhale 2 puffs into the lungs every 6 (six) hours as needed for wheezing or shortness of breath. For wheezing         . clonazePAM (KLONOPIN) 1 MG tablet   Oral   Take 1 mg by mouth 2 (two) times daily. For anxiety         . HYDROcodone-acetaminophen (NORCO) 10-325 MG per tablet   Oral   Take 1-2 tablets by mouth every 4 (four) hours as needed for moderate pain.         Marland Kitchen lamoTRIgine (LAMICTAL) 100 MG tablet   Oral   Take 100 mg by mouth at bedtime.          Marland Kitchen omeprazole (PRILOSEC) 20 MG capsule   Oral   Take 20 mg by mouth daily as needed (for heartburn).          . pregabalin (LYRICA) 300 MG capsule   Oral   Take 300 mg by mouth at bedtime.          . traZODone (DESYREL) 150 MG tablet   Oral   Take 150 mg by mouth at bedtime.         Marland Kitchen HYDROcodone-acetaminophen (NORCO) 10-325 MG per tablet   Oral   Take 1 tablet by mouth every 6 (six) hours as needed.   20 tablet   0    BP 158/99  Pulse 84  Temp(Src) 98.1 F (36.7 C) (Oral)  Resp 18  Ht '5\' 2"'  (1.575 m)  Wt 100 lb (45.36 kg)  BMI 18.29 kg/m2  SpO2 95%  LMP 08/23/2013 Physical Exam  Constitutional: She is oriented to person, place, and time. She appears well-developed and well-nourished. No distress.  HENT:  Head: Normocephalic and atraumatic.  Mouth/Throat: No oropharyngeal exudate.  Eyes: Pupils are equal, round, and reactive to  light.  Neck: Normal range of motion. Neck supple.  Cardiovascular: Normal rate, regular rhythm and normal heart sounds.  Exam reveals no gallop and no friction rub.   No murmur heard. Pulmonary/Chest: Effort normal and breath sounds normal. No respiratory distress. She has no wheezes. She has no rales.  Abdominal: Soft. Bowel sounds are normal. She exhibits no distension and no mass. There is no tenderness. There is no rebound and no guarding.  Musculoskeletal: Normal range of motion. She exhibits no edema and no tenderness.       Back:  Surgical scar  at lumbar spine C/D/I without drainage. She has soft tissue swelling at this site w/o warmth or overlying erythema.   Neurological: She is alert and oriented to person, place, and time. No cranial nerve deficit or sensory deficit. GCS eye subscore is 4. GCS verbal subscore is 5.  Give way weakness of LLE.    Skin: Skin is warm and dry.  Psychiatric: She has a normal mood and affect.    ED Course  Procedures (including critical care time) Labs Review Labs Reviewed  CBC WITH DIFFERENTIAL - Abnormal; Notable for the following:    RBC 3.54 (*)    MCV 103.4 (*)    MCH 35.3 (*)    All other components within normal limits  BASIC METABOLIC PANEL - Abnormal; Notable for the following:    BUN 5 (*)    All other components within normal limits  SEDIMENTATION RATE - Abnormal; Notable for the following:    Sed Rate 37 (*)    All other components within normal limits  C-REACTIVE PROTEIN - Abnormal; Notable for the following:    CRP <0.5 (*)    All other components within normal limits   Imaging Review Mr Lumbar Spine W Wo Contrast  08/23/2013   CLINICAL DATA:  Status post diskectomy July 13, 2013, 4 days of drainage from the surgical site, reported to have abscess requiring surgical debridement.  EXAM: MRI LUMBAR SPINE WITHOUT AND WITH CONTRAST  TECHNIQUE: Multiplanar and multiecho pulse sequences of the lumbar spine were obtained without  and with intravenous contrast.  CONTRAST:  70m MULTIHANCE GADOBENATE DIMEGLUMINE 529 MG/ML IV SOLN  COMPARISON:  DG LUMBAR SPINE 2-3 VIEWS dated 07/13/2013; MR LUMBAR SPINE WO/W CM dated 05/17/2013  FINDINGS: Status post apparent interval left L4-5 hemilaminectomy, left L5 and S1 pedicle screws were present previously, with L4-5 interbody disc material and L5-S1 laminectomies. Interval L4-5 discectomy, with abnormal enhancing bright STIR signal within the left L4-5 lateral recess, effacing the left lateral recess, which may affect the traversing left L5 nerve. 21 x 13 x 620m(transverse by AP by CC) paraspinal fluid collection within the central subcutaneous fat overlying the spinous process of L3 and L4, with ill-defined rim enhancement, and possible cutaneous tract. There is mild additional enhancement within the paraspinal soft tissues associated with denervation with severe paraspinal muscle atrophy at the level of sacrum consistent with remote surgical intervention. No suspicious epidural fluid collections.  Acute moderate to severe discogenic endplate changes towards the left at L4-5, with mild geographic enhancement. No suspicious osseous or intradiscal enhancement.  Moderate chronic L1 burst fracture, as previously seen with similar retropulsed bony fragments, resulting in mild canal stenosis. Conus medullaris terminates at L1-2 and appears normal morphology and signal characteristics. Cauda equina is unremarkable. No abnormal cord or leptomeningeal enhancement. No nerve root clumping. Included prevertebral soft tissues are unremarkable.  Level by level evaluation:  L1-2, L2-3, L3-4: No disc bulge, canal stenosis or neural foraminal narrowing.  L4-5: Status post left hemilaminectomy with posterior decompression. No canal stenosis. Possible mild neural foraminal narrowing which may be overestimated by hardware artifact.  L5-S1. Subcentimeter right L5 perineural cyst again seen. Status post laminectomy and  posterior decompression. No canal stenosis or neural foraminal narrowing.  IMPRESSION: Status post recent left L4-5 hemilaminectomy for apparent microdiscectomy, with enhancing soft tissue within the lateral recess favoring hematoma and granulation tissue, superimposed infectious process is less likely though, not excluded.  Increasing acute discogenic endplate changes towards the left at L4-5 likely secondary to  recent surgery without MR findings of discitis osteomyelitis.  21 x 30 x 16 mm subcutaneous fluid collection within the midline paraspinal soft tissues at L3 through L4, which could reflect abscess or possibly degenerating hematoma. Recommend correlation with fluid sampling if clinically indicated.   Electronically Signed   By: Elon Alas   On: 08/23/2013 18:42     EKG Interpretation  Date/Time:    Ventricular Rate:    PR Interval:    QRS Duration:   QT Interval:    QTC Calculation:   R Axis:     Text Interpretation:         MDM   Final diagnoses:  Post-operative pain  Hematoma, postoperative    Pt is a 43 y.o. female with Pmhx as above who presents with worsening pain back since lumbar laminectomy/decompression microdiscectomy L4/5 on 1/16. 4 days ago she reported having large aroun dof purulent drainage for surgical site, and inc soft tissue swelling. Pain radiates down L leg and is assoc w/ leg weakness (she had this pre-op though reports is gradually worsening).  Denies bowel or bladder incontinence, fever, saddle anesthesia. She saw neurology yesterday have felt post-op abscess needed to be ruled out by MRI wwo contrast and scheduled On PE, VSS, pt in NAD.  She has give-away weakness of LLE, nml sensation. W/u shows nml WBC count (dec from yesterday). ESR 37, CPR <0.5.  MRI lumbar spine ww/o contrast done which showed "enhancing soft tissue within the lateral recess favoring hematoma and granulation tissue, superimposed infectious process is less likely though, not  excluded" and "21 x 30 x 16 mm subcutaneous fluid collection within the midline paraspinal soft tissues at L3 through L4, which could reflect abscess or possibly degenerating hematoma".  Clinically this fluid collection not c/w abscess given the surgical site in C/D/I and here is not overlying erythema or warmth.  I do not feel fluid collection in lateral recess is c/w abscess either given lack of fever & nml WBC.  Have spoken w/ on call [facility] who rec TLSO brace for support and f/u in the office with Dr. Christella Noa tomorrow.  Return precautions given for new or worsening symptoms including worsening pain, fever, focal neuro symptoms changed from baseline.             Neta Ehlers, MD 08/24/13 [phone]

## 2013-08-23 NOTE — Progress Notes (Signed)
Orthopedic Tech Progress Note Patient Details:  Rachael Ramirez 08/20/1970 [id number] Called [employer] for brace order. Patient ID: Rachael Ramirez, female   DOB: 07/01/1970, 43 y.o.   MRN: [id number]   Jennye MoccasinHughes, Harlym Gehling Craig 08/23/2013, 8:44 PM

## 2013-08-23 NOTE — Progress Notes (Signed)
Orthopedic Tech Progress Note Patient Details:  Wende BushyRachel L Naff 08/25/1970 [id number] Brace completed by [employer] vendor. Patient ID: Wende BushyRachel L Rockefeller, female   DOB: 03/10/1971, 43 y.o.   MRN: [id number]   Jennye MoccasinHughes, Marilyn Nihiser Craig 08/23/2013, 10:17 PM

## 2013-08-24 ENCOUNTER — Telehealth: Payer: Self-pay | Admitting: Neurology

## 2013-08-24 NOTE — Telephone Encounter (Signed)
Pt called wants Dr. Anne HahnWillis nurse to return her call concerning she can't get her surgery Dr. Carmela HurtABBELL,KYLE L, MD to  return her call. Per Dr. Anne HahnWillis advised pt if CABBELL,KYLE L, MD did not call back he would call to get her an apt with the surgeon.

## 2013-08-24 NOTE — Telephone Encounter (Signed)
I called patient. The blood work showed a normal white blood count, some reduction in the total protein level suggesting poor nutrition. Sedimentation rate was mildly elevated. The patient underwent MRI of the lumbosacral size the emergency room. This showed a possible small abscess cavity just underneath the skin.The patient will be seen by Dr. Franky Machoabbell today. No definite infection around the site of surgery. We will cancel our order for the lumbosacral spine.  MRI lumbosacral spine 08/23/2013:  IMPRESSION:  Status post recent left L4-5 hemilaminectomy for apparent  microdiscectomy, with enhancing soft tissue within the lateral  recess favoring hematoma and granulation tissue, superimposed  infectious process is less likely though, not excluded.  Increasing acute discogenic endplate changes towards the left at  L4-5 likely secondary to recent surgery without MR findings of  discitis osteomyelitis.  21 x 30 x 16 mm subcutaneous fluid collection within the midline  paraspinal soft tissues at L3 through L4, which could reflect  abscess or possibly degenerating hematoma. Recommend correlation  with fluid sampling if clinically indicated.

## 2013-08-24 NOTE — Progress Notes (Signed)
ED CM received incoming call from  [facility] regarding Norco prescription she received 2/26 from [facility]. Pharmacist notifying ED that patient has already received Norco from another practitioner this month.

## 2013-08-24 NOTE — Telephone Encounter (Signed)
I called the patient. The patient has an appointment with Dr. Franky Machoabbell Monday, she will keep the appointment.

## 2013-08-24 NOTE — Telephone Encounter (Signed)
Patient called stating that Dr. Anne HahnWillis stated that if she could not get her surgeon Dr. Coletta Memosabbell, Kyle, MD to return her call that Dr. Anne HahnWillis would call to get her an appt with the surgeon. Patient would like a call back. Thanks

## 2013-08-26 DIAGNOSIS — J9819 Other pulmonary collapse: Secondary | ICD-10-CM

## 2013-08-26 HISTORY — DX: Other pulmonary collapse: J98.19

## 2013-08-30 ENCOUNTER — Other Ambulatory Visit: Payer: Managed Care, Other (non HMO)

## 2013-09-05 ENCOUNTER — Encounter (HOSPITAL_COMMUNITY): Payer: Self-pay | Admitting: Emergency Medicine

## 2013-09-05 ENCOUNTER — Emergency Department (HOSPITAL_COMMUNITY)
Admission: EM | Admit: 2013-09-05 | Discharge: 2013-09-05 | Disposition: A | Discharge status: Home / Self Care | Payer: Managed Care, Other (non HMO) | Attending: Emergency Medicine | Admitting: Emergency Medicine

## 2013-09-05 DIAGNOSIS — Z4789 Encounter for other orthopedic aftercare: Secondary | ICD-10-CM | POA: Insufficient documentation

## 2013-09-05 DIAGNOSIS — R630 Anorexia: Secondary | ICD-10-CM | POA: Insufficient documentation

## 2013-09-05 DIAGNOSIS — G8929 Other chronic pain: Secondary | ICD-10-CM | POA: Insufficient documentation

## 2013-09-05 DIAGNOSIS — F17200 Nicotine dependence, unspecified, uncomplicated: Secondary | ICD-10-CM | POA: Insufficient documentation

## 2013-09-05 DIAGNOSIS — M549 Dorsalgia, unspecified: Secondary | ICD-10-CM

## 2013-09-05 DIAGNOSIS — F319 Bipolar disorder, unspecified: Secondary | ICD-10-CM | POA: Insufficient documentation

## 2013-09-05 DIAGNOSIS — F411 Generalized anxiety disorder: Secondary | ICD-10-CM | POA: Insufficient documentation

## 2013-09-05 DIAGNOSIS — Z79899 Other long term (current) drug therapy: Secondary | ICD-10-CM | POA: Insufficient documentation

## 2013-09-05 DIAGNOSIS — K219 Gastro-esophageal reflux disease without esophagitis: Secondary | ICD-10-CM | POA: Insufficient documentation

## 2013-09-05 DIAGNOSIS — J45909 Unspecified asthma, uncomplicated: Secondary | ICD-10-CM | POA: Insufficient documentation

## 2013-09-05 DIAGNOSIS — M545 Low back pain, unspecified: Secondary | ICD-10-CM | POA: Insufficient documentation

## 2013-09-05 MED ORDER — OXYCODONE-ACETAMINOPHEN 5-325 MG PO TABS
2.0000 | ORAL_TABLET | Freq: Once | ORAL | Status: AC
  Administered 2013-09-05: 2 via ORAL
  Filled 2013-09-05: qty 2

## 2013-09-05 MED ORDER — OXYCODONE-ACETAMINOPHEN 5-325 MG PO TABS: 1.0000 | ORAL_TABLET | ORAL | Status: DC | PRN

## 2013-09-05 NOTE — Discharge Instructions (Signed)
Back Pain, Adult Low back pain is very common. About 1 in 5 people have back pain.The cause of low back pain is rarely dangerous. The pain often gets better over time.About half of people with a sudden onset of back pain feel better in just 2 weeks. About 8 in 10 people feel better by 6 weeks.  CAUSES Some common causes of back pain include:  Strain of the muscles or ligaments supporting the spine.  Wear and tear (degeneration) of the spinal discs.  Arthritis.  Direct injury to the back. DIAGNOSIS Most of the time, the direct cause of low back pain is not known.However, back pain can be treated effectively even when the exact cause of the pain is unknown.Answering your caregiver's questions about your overall health and symptoms is one of the most accurate ways to make sure the cause of your pain is not dangerous. If your caregiver needs more information, he or she may order lab work or imaging tests (X-rays or MRIs).However, even if imaging tests show changes in your back, this usually does not require surgery. HOME CARE INSTRUCTIONS For many people, back pain returns.Since low back pain is rarely dangerous, it is often a condition that people can learn to manageon their own.   Remain active. It is stressful on the back to sit or stand in one place. Do not sit, drive, or stand in one place for more than 30 minutes at a time. Take short walks on level surfaces as soon as pain allows.Try to increase the length of time you walk each day.  Do not stay in bed.Resting more than 1 or 2 days can delay your recovery.  Do not avoid exercise or work.Your body is made to move.It is not dangerous to be active, even though your back may hurt.Your back will likely heal faster if you return to being active before your pain is gone.  Pay attention to your body when you bend and lift. Many people have less discomfortwhen lifting if they bend their knees, keep the load close to their bodies,and  avoid twisting. Often, the most comfortable positions are those that put less stress on your recovering back.  Find a comfortable position to sleep. Use a firm mattress and lie on your side with your knees slightly bent. If you lie on your back, put a pillow under your knees.  Only take over-the-counter or prescription medicines as directed by your caregiver. Over-the-counter medicines to reduce pain and inflammation are often the most helpful.Your caregiver may prescribe muscle relaxant drugs.These medicines help dull your pain so you can more quickly return to your normal activities and healthy exercise.  Put ice on the injured area.  Put ice in a plastic bag.  Place a towel between your skin and the bag.  Leave the ice on for 15-20 minutes, 03-04 times a day for the first 2 to 3 days. After that, ice and heat may be alternated to reduce pain and spasms.  Ask your caregiver about trying back exercises and gentle massage. This may be of some benefit.  Avoid feeling anxious or stressed.Stress increases muscle tension and can worsen back pain.It is important to recognize when you are anxious or stressed and learn ways to manage it.Exercise is a great option. SEEK MEDICAL CARE IF:  You have pain that is not relieved with rest or medicine.  You have pain that does not improve in 1 week.  You have new symptoms.  You are generally not feeling well. SEEK   IMMEDIATE MEDICAL CARE IF:   You have pain that radiates from your back into your legs.  You develop new bowel or bladder control problems.  You have unusual weakness or numbness in your arms or legs.  You develop nausea or vomiting.  You develop abdominal pain.  You feel faint. Document Released: 06/14/2005 Document Revised: 12/14/2011 Document Reviewed: 11/02/2010 [employer] Patient Information 2014 [employer].  

## 2013-09-05 NOTE — ED Provider Notes (Signed)
CSN: [id number]     Arrival date & time 09/05/13  1701 History  This chart was scribed for non-physician practitioner, Elpidio AnisShari Akito Boomhower, PA-C, working with Gwyneth SproutWhitney Plunkett, MD, by Ellin MayhewMichael Levi, ED Scribe. This patient was seen in room WTR4/WLPT4 and the patient's care was started at 7:11 PM.   The history is provided by the patient. No language interpreter was used.   HPI Comments: Rachael Ramirez is a 43 y.o. Female with a history of back pain and back surgery who presents to the Emergency Department with a chief complaint of L lower back pain. Patient states that her pain started getting worse two weeks ago, and has progressively worsened since. Patient confirms having her 5th surgery in January 2015. Following the surgery, patient states she had an MRI with or w/o contrast on 08/23/2013 with no significant findings, and was referred to see Dr. Franky Machoabbell. Patient reports that she had a hole in her back with yellow-dark drainage when seeing Dr. Franky Machoabbell four days ago that has continued until today. Patient characterizes the pain as constant and severe that has affected her motivation/mood. Patient's relative reports that she has been depressed. Patient confirms taking hydrocodone and flexeril for pain, with no relief. She denies any fever, constipation, nausea, vomiting, or diarrhea. Patient takes anti-diarrhea medication because exertion (in the restroom, sneezing and coughing) exacerbates the pain in her back.   PCP is Dr. Mirian Capuchinomlison.  Past Medical History  Diagnosis Date  . Back pain   . Asthma     uses Combivent daily as needed  . Insomnia     takes Trazodone nightly  . Anxiety     takes Klonopin daily as needed  . PONV (postoperative nausea and vomiting)   . History of bronchitis   . Cough   . Spinal headache   . Weakness     left leg  . Chronic back pain     HNP and Radiculopathy  . GERD (gastroesophageal reflux disease)     takes Omeprazole daily  . Urinary urgency   . Bipolar 1 disorder      takes Lamictal daily  . Chronic low back pain 08/22/2013  . MRSA (methicillin resistant Staphylococcus aureus)     Postive nasal swab   Past Surgical History  Procedure Laterality Date  . Hand surgery Right 1992    has pins fracture  . Spinal fusion  1996  . Left arm surgery  1975    left elbow fracture  . Redo left arm surgery  2002  . Cervical fusion    . Back surgery      x 4 one with fusion  . Harware removal from back    . Epidural block injection    . Lumbar laminectomy/decompression microdiscectomy Left 07/13/2013    Procedure: Left Lumbar four-five microdiskectomy;  Surgeon: Carmela HurtKyle L Cabbell, MD;  Location: [facility];  Service: Neurosurgery;  Laterality: Left;  Left Lumbar four-five microdiskectomy   Family History  Problem Relation Age of Onset  . Multiple sclerosis Mother   . COPD Father   . Alcohol abuse Maternal Grandfather   . Alcohol abuse Brother   . Cirrhosis Brother    History  Substance Use Topics  . Smoking status: Current Every Day Smoker -- 1.00 packs/day for 18 years  . Smokeless tobacco: Never Used  . Alcohol Use: No   OB History   Grav Para Term Preterm Abortions TAB SAB Ect Mult Living  Review of Systems  Constitutional: Positive for appetite change. Negative for chills.  Respiratory: Negative for shortness of breath.   Gastrointestinal: Negative for nausea, vomiting, diarrhea and constipation.  Musculoskeletal: Positive for back pain. Negative for neck pain and neck stiffness.  Skin: Positive for wound (surgical wound with yellow-dark drainage).  Neurological: Negative for dizziness, light-headedness and headaches.  Psychiatric/Behavioral: Negative for suicidal ideas.   Allergies  Minocycline  Home Medications   Current Outpatient Rx  Name  Route  Sig  Dispense  Refill  . albuterol-ipratropium (COMBIVENT) 18-103 MCG/ACT inhaler   Inhalation   Inhale 2 puffs into the lungs every 6 (six) hours as needed for wheezing  or shortness of breath. For wheezing         . clonazePAM (KLONOPIN) 1 MG tablet   Oral   Take 1 mg by mouth 2 (two) times daily. For anxiety         . HYDROcodone-acetaminophen (NORCO) 10-325 MG per tablet   Oral   Take 1-2 tablets by mouth every 4 (four) hours as needed for moderate pain.         Marland Kitchen lamoTRIgine (LAMICTAL) 100 MG tablet   Oral   Take 100 mg by mouth at bedtime.          Marland Kitchen omeprazole (PRILOSEC) 20 MG capsule   Oral   Take 20 mg by mouth daily as needed (for heartburn).          . pregabalin (LYRICA) 300 MG capsule   Oral   Take 300 mg by mouth at bedtime.          . traZODone (DESYREL) 150 MG tablet   Oral   Take 150 mg by mouth at bedtime.         . minocycline (MINOCIN,DYNACIN) 100 MG capsule   Oral   Take 1 capsule by mouth 2 (two) times daily.          Triage Vitals: BP 148/95  Pulse 83  Temp(Src) 99 F (37.2 C) (Oral)  SpO2 97%  LMP 08/23/2013  Physical Exam  Nursing note and vitals reviewed. Constitutional: She is oriented to person, place, and time. She appears distressed.  Depressed mood.  HENT:  Head: Normocephalic and atraumatic.  Neck: Normal range of motion. Neck supple.  Cardiovascular: Normal rate.   Pulmonary/Chest: Effort normal. No respiratory distress.  Musculoskeletal: She exhibits tenderness.  Lower back has well healed midline surgical scar without redness, no warmth, and no drainage. Significant tenderness in entire lumbar region to light touch. No calf tenderness.   Neurological: She is alert and oriented to person, place, and time.  Skin: Skin is warm and dry.   ED Course  Procedures (including critical care time)  DIAGNOSTIC STUDIES: Oxygen Saturation is 96% on room air, adequate by my interpretation.    COORDINATION OF CARE: 7:46 PM-Discussed trying physical therapy as a new option for patient and taking anti-inflammatory medication. Discussed alternative referrals with patient by getting in touch  with Dr. Mirian Capuchin. Treatment plan discussed with patient and patient agrees.  Labs Review Labs Reviewed - No data to display Imaging Review No results found.   EKG Interpretation None      MDM   Final diagnoses:  None    1. Back pain, chronic  No evidence infection in lower back. No redness, incisional drainage. She is afebrile. Given history of persistent pain, follow up with Dr. Franky Macho who is reported to feel further surgery is unneccessary, will refer back to primary  care, given limited number of Percocet.  I personally performed the services described in this documentation, which was scribed in my presence. The recorded information has been reviewed and is accurate.     Arnoldo Hooker, PA-C 09/08/13 [phone]

## 2013-09-05 NOTE — ED Notes (Signed)
Pt states that she had 5 back surgeries, most recent in January. Has had increasing pain in past 2 weeks that became unbearable today. Alert and oriented.

## 2013-09-08 NOTE — ED Provider Notes (Signed)
Medical screening examination/treatment/procedure(s) were performed by non-physician practitioner and as supervising physician I was immediately available for consultation/collaboration.   EKG Interpretation None        Rolland PorterMark Crimson Beer, MD 09/08/13 [phone]

## 2013-09-14 ENCOUNTER — Encounter (HOSPITAL_COMMUNITY): Payer: Self-pay | Admitting: Emergency Medicine

## 2013-09-14 ENCOUNTER — Inpatient Hospital Stay (HOSPITAL_COMMUNITY)
Admission: RE | Admit: 2013-09-14 | Discharge: 2013-09-23 | DRG: 907 | Disposition: A | Discharge status: Home Health | Payer: Managed Care, Other (non HMO) | Attending: Internal Medicine | Admitting: Internal Medicine

## 2013-09-14 DIAGNOSIS — J9819 Other pulmonary collapse: Secondary | ICD-10-CM

## 2013-09-14 DIAGNOSIS — T50902A Poisoning by unspecified drugs, medicaments and biological substances, intentional self-harm, initial encounter: Secondary | ICD-10-CM

## 2013-09-14 DIAGNOSIS — Z915 Personal history of self-harm: Secondary | ICD-10-CM

## 2013-09-14 DIAGNOSIS — F1910 Other psychoactive substance abuse, uncomplicated: Secondary | ICD-10-CM | POA: Diagnosis present

## 2013-09-14 DIAGNOSIS — F32A Depression, unspecified: Secondary | ICD-10-CM

## 2013-09-14 DIAGNOSIS — J449 Chronic obstructive pulmonary disease, unspecified: Secondary | ICD-10-CM | POA: Diagnosis present

## 2013-09-14 DIAGNOSIS — Z836 Family history of other diseases of the respiratory system: Secondary | ICD-10-CM

## 2013-09-14 DIAGNOSIS — K219 Gastro-esophageal reflux disease without esophagitis: Secondary | ICD-10-CM | POA: Diagnosis present

## 2013-09-14 DIAGNOSIS — X58XXXA Exposure to other specified factors, initial encounter: Secondary | ICD-10-CM | POA: Diagnosis present

## 2013-09-14 DIAGNOSIS — T17408A Unspecified foreign body in trachea causing other injury, initial encounter: Secondary | ICD-10-CM | POA: Diagnosis not present

## 2013-09-14 DIAGNOSIS — R9431 Abnormal electrocardiogram [ECG] [EKG]: Secondary | ICD-10-CM

## 2013-09-14 DIAGNOSIS — T17808A Unspecified foreign body in other parts of respiratory tract causing other injury, initial encounter: Secondary | ICD-10-CM

## 2013-09-14 DIAGNOSIS — Z79899 Other long term (current) drug therapy: Secondary | ICD-10-CM

## 2013-09-14 DIAGNOSIS — M545 Low back pain, unspecified: Secondary | ICD-10-CM

## 2013-09-14 DIAGNOSIS — T50901A Poisoning by unspecified drugs, medicaments and biological substances, accidental (unintentional), initial encounter: Secondary | ICD-10-CM

## 2013-09-14 DIAGNOSIS — E43 Unspecified severe protein-calorie malnutrition: Secondary | ICD-10-CM

## 2013-09-14 DIAGNOSIS — Z8614 Personal history of Methicillin resistant Staphylococcus aureus infection: Secondary | ICD-10-CM

## 2013-09-14 DIAGNOSIS — M549 Dorsalgia, unspecified: Secondary | ICD-10-CM

## 2013-09-14 DIAGNOSIS — IMO0002 Reserved for concepts with insufficient information to code with codable children: Secondary | ICD-10-CM | POA: Diagnosis not present

## 2013-09-14 DIAGNOSIS — F419 Anxiety disorder, unspecified: Secondary | ICD-10-CM

## 2013-09-14 DIAGNOSIS — I10 Essential (primary) hypertension: Secondary | ICD-10-CM | POA: Diagnosis present

## 2013-09-14 DIAGNOSIS — Z6379 Other stressful life events affecting family and household: Secondary | ICD-10-CM

## 2013-09-14 DIAGNOSIS — F319 Bipolar disorder, unspecified: Secondary | ICD-10-CM

## 2013-09-14 DIAGNOSIS — E876 Hypokalemia: Secondary | ICD-10-CM

## 2013-09-14 DIAGNOSIS — Z609 Problem related to social environment, unspecified: Secondary | ICD-10-CM

## 2013-09-14 DIAGNOSIS — Z981 Arthrodesis status: Secondary | ICD-10-CM

## 2013-09-14 DIAGNOSIS — T43591A Poisoning by other antipsychotics and neuroleptics, accidental (unintentional), initial encounter: Secondary | ICD-10-CM | POA: Diagnosis present

## 2013-09-14 DIAGNOSIS — F1920 Other psychoactive substance dependence, uncomplicated: Secondary | ICD-10-CM | POA: Diagnosis present

## 2013-09-14 DIAGNOSIS — F329 Major depressive disorder, single episode, unspecified: Secondary | ICD-10-CM | POA: Diagnosis present

## 2013-09-14 DIAGNOSIS — G8929 Other chronic pain: Secondary | ICD-10-CM

## 2013-09-14 DIAGNOSIS — Z681 Body mass index (BMI) 19 or less, adult: Secondary | ICD-10-CM

## 2013-09-14 DIAGNOSIS — M5126 Other intervertebral disc displacement, lumbar region: Secondary | ICD-10-CM

## 2013-09-14 DIAGNOSIS — T17508A Unspecified foreign body in bronchus causing other injury, initial encounter: Secondary | ICD-10-CM

## 2013-09-14 DIAGNOSIS — S32009A Unspecified fracture of unspecified lumbar vertebra, initial encounter for closed fracture: Secondary | ICD-10-CM | POA: Diagnosis present

## 2013-09-14 DIAGNOSIS — G4700 Insomnia, unspecified: Secondary | ICD-10-CM | POA: Diagnosis present

## 2013-09-14 DIAGNOSIS — F3289 Other specified depressive episodes: Secondary | ICD-10-CM

## 2013-09-14 DIAGNOSIS — F411 Generalized anxiety disorder: Secondary | ICD-10-CM | POA: Diagnosis present

## 2013-09-14 DIAGNOSIS — Z8659 Personal history of other mental and behavioral disorders: Secondary | ICD-10-CM

## 2013-09-14 DIAGNOSIS — Z9119 Patient's noncompliance with other medical treatment and regimen: Secondary | ICD-10-CM

## 2013-09-14 DIAGNOSIS — F3130 Bipolar disorder, current episode depressed, mild or moderate severity, unspecified: Secondary | ICD-10-CM | POA: Diagnosis present

## 2013-09-14 DIAGNOSIS — J4489 Other specified chronic obstructive pulmonary disease: Secondary | ICD-10-CM | POA: Diagnosis present

## 2013-09-14 DIAGNOSIS — S32010G Wedge compression fracture of first lumbar vertebra, subsequent encounter for fracture with delayed healing: Secondary | ICD-10-CM

## 2013-09-14 DIAGNOSIS — T43294A Poisoning by other antidepressants, undetermined, initial encounter: Principal | ICD-10-CM | POA: Diagnosis present

## 2013-09-14 DIAGNOSIS — F17200 Nicotine dependence, unspecified, uncomplicated: Secondary | ICD-10-CM | POA: Diagnosis present

## 2013-09-14 DIAGNOSIS — Z91199 Patient's noncompliance with other medical treatment and regimen due to unspecified reason: Secondary | ICD-10-CM

## 2013-09-14 DIAGNOSIS — J9600 Acute respiratory failure, unspecified whether with hypoxia or hypercapnia: Secondary | ICD-10-CM | POA: Diagnosis not present

## 2013-09-14 DIAGNOSIS — Z9151 Personal history of suicidal behavior: Secondary | ICD-10-CM

## 2013-09-14 DIAGNOSIS — J9601 Acute respiratory failure with hypoxia: Secondary | ICD-10-CM

## 2013-09-14 LAB — COMPREHENSIVE METABOLIC PANEL
ALBUMIN: 2.9 g/dL — AB (ref 3.5–5.2)
ALK PHOS: 166 U/L — AB (ref 39–117)
ALT: 5 U/L (ref 0–35)
AST: 12 U/L (ref 0–37)
BILIRUBIN TOTAL: 0.4 mg/dL (ref 0.3–1.2)
BUN: 5 mg/dL — AB (ref 6–23)
CHLORIDE: 99 meq/L (ref 96–112)
CO2: 28 meq/L (ref 19–32)
Calcium: 9.4 mg/dL (ref 8.4–10.5)
Creatinine, Ser: 0.53 mg/dL (ref 0.50–1.10)
GFR calc Af Amer: >90 mL/min (ref >90)
GFR calc non Af Amer: >90 mL/min (ref >90)
Glucose, Bld: 139 mg/dL — ABNORMAL HIGH (ref 70–99)
Potassium: 3.2 mEq/L — ABNORMAL LOW (ref 3.7–5.3)
Sodium: 142 mEq/L (ref 137–147)
Total Protein: 7 g/dL (ref 6.0–8.3)

## 2013-09-14 LAB — CBC WITH DIFFERENTIAL/PLATELET
BASOS PCT: 0 % (ref 0–1)
Basophils Absolute: 0 10*3/uL (ref 0.0–0.1)
Eosinophils Absolute: 0 10*3/uL (ref 0.0–0.7)
Eosinophils Relative: 0 % (ref 0–5)
HCT: 39.2 % (ref 36.0–46.0)
HEMOGLOBIN: 13.1 g/dL (ref 12.0–15.0)
LYMPHS PCT: 20 % (ref 12–46)
Lymphs Abs: 1.5 10*3/uL (ref 0.7–4.0)
MCH: 34.4 pg — ABNORMAL HIGH (ref 26.0–34.0)
MCHC: 33.4 g/dL (ref 30.0–36.0)
MCV: 102.9 fL — ABNORMAL HIGH (ref 78.0–100.0)
Monocytes Absolute: 0.4 10*3/uL (ref 0.1–1.0)
Monocytes Relative: 6 % (ref 3–12)
NEUTROS ABS: 5.8 10*3/uL (ref 1.7–7.7)
NEUTROS PCT: 74 % (ref 43–77)
Platelets: 366 10*3/uL (ref 150–400)
RBC: 3.81 MIL/uL — ABNORMAL LOW (ref 3.87–5.11)
RDW: 14.2 % (ref 11.5–15.5)
WBC: 7.8 10*3/uL (ref 4.0–10.5)

## 2013-09-14 LAB — RAPID URINE DRUG SCREEN, HOSP PERFORMED
Amphetamines: NOT DETECTED
BARBITURATES: NOT DETECTED
Benzodiazepines: NOT DETECTED
Cocaine: NOT DETECTED
Opiates: NOT DETECTED
TETRAHYDROCANNABINOL: NOT DETECTED

## 2013-09-14 LAB — TROPONIN I: Troponin I: <0.3 ng/mL (ref <0.30)

## 2013-09-14 LAB — MAGNESIUM
Magnesium: 2.3 mg/dL (ref 1.5–2.5)
Magnesium: 2.1 mg/dL (ref 1.5–2.5)

## 2013-09-14 LAB — CBC
HEMATOCRIT: 33.6 % — AB (ref 36.0–46.0)
HEMOGLOBIN: 11.4 g/dL — AB (ref 12.0–15.0)
MCH: 34.7 pg — ABNORMAL HIGH (ref 26.0–34.0)
MCHC: 33.9 g/dL (ref 30.0–36.0)
MCV: 102.1 fL — ABNORMAL HIGH (ref 78.0–100.0)
Platelets: 359 10*3/uL (ref 150–400)
RBC: 3.29 MIL/uL — ABNORMAL LOW (ref 3.87–5.11)
RDW: 14.2 % (ref 11.5–15.5)
WBC: 6.8 10*3/uL (ref 4.0–10.5)

## 2013-09-14 LAB — CREATININE, SERUM
CREATININE: 0.58 mg/dL (ref 0.50–1.10)
GFR calc Af Amer: >90 mL/min (ref >90)

## 2013-09-14 LAB — ACETAMINOPHEN LEVEL: Acetaminophen (Tylenol), Serum: <15 ug/mL (ref 10–30)

## 2013-09-14 LAB — SALICYLATE LEVEL: Salicylate Lvl: <2 mg/dL — ABNORMAL LOW (ref 2.8–20.0)

## 2013-09-14 LAB — ETHANOL

## 2013-09-14 MED ORDER — IPRATROPIUM-ALBUTEROL 0.5-2.5 (3) MG/3ML IN SOLN
3.0000 mL | Freq: Four times a day (QID) | RESPIRATORY_TRACT | Status: DC | PRN
  Administered 2013-09-15 – 2013-09-16 (×2): 3 mL via RESPIRATORY_TRACT
  Filled 2013-09-14 (×2): qty 3

## 2013-09-14 MED ORDER — ONDANSETRON HCL 4 MG/2ML IJ SOLN
4.0000 mg | Freq: Three times a day (TID) | INTRAMUSCULAR | Status: DC | PRN
  Administered 2013-09-15: 4 mg via INTRAVENOUS
  Filled 2013-09-14: qty 2

## 2013-09-14 MED ORDER — ONDANSETRON HCL 4 MG/2ML IJ SOLN
4.0000 mg | Freq: Once | INTRAMUSCULAR | Status: AC
  Administered 2013-09-14: 4 mg via INTRAVENOUS
  Filled 2013-09-14: qty 2

## 2013-09-14 MED ORDER — IPRATROPIUM-ALBUTEROL 18-103 MCG/ACT IN AERO: 2.0000 | INHALATION_SPRAY | Freq: Four times a day (QID) | RESPIRATORY_TRACT | Status: DC | PRN

## 2013-09-14 MED ORDER — CLONAZEPAM 1 MG PO TABS: 1.0000 mg | ORAL_TABLET | Freq: Two times a day (BID) | ORAL | Status: DC

## 2013-09-14 MED ORDER — NICOTINE 21 MG/24HR TD PT24
21.0000 mg | MEDICATED_PATCH | Freq: Every day | TRANSDERMAL | Status: DC
  Administered 2013-09-14 – 2013-09-23 (×8): 21 mg via TRANSDERMAL
  Filled 2013-09-14 (×11): qty 1

## 2013-09-14 MED ORDER — ENOXAPARIN SODIUM 40 MG/0.4ML ~~LOC~~ SOLN
40.0000 mg | SUBCUTANEOUS | Status: DC
  Administered 2013-09-14 – 2013-09-17 (×4): 40 mg via SUBCUTANEOUS
  Filled 2013-09-14 (×7): qty 0.4

## 2013-09-14 MED ORDER — SODIUM CHLORIDE 0.9 % IV BOLUS (SEPSIS)
1000.0000 mL | Freq: Once | INTRAVENOUS | Status: AC
  Administered 2013-09-14: 1000 mL via INTRAVENOUS

## 2013-09-14 MED ORDER — SODIUM CHLORIDE 0.9 % IV SOLN
INTRAVENOUS | Status: AC
  Administered 2013-09-14: 23:00:00 via INTRAVENOUS

## 2013-09-14 MED ORDER — LAMOTRIGINE 100 MG PO TABS
100.0000 mg | ORAL_TABLET | Freq: Every day | ORAL | Status: DC
  Filled 2013-09-14 (×2): qty 1

## 2013-09-14 MED ORDER — SODIUM CHLORIDE 0.9 % IV SOLN
INTRAVENOUS | Status: DC
  Administered 2013-09-15 (×2): via INTRAVENOUS

## 2013-09-14 MED ORDER — PANTOPRAZOLE SODIUM 40 MG PO TBEC
40.0000 mg | DELAYED_RELEASE_TABLET | Freq: Every day | ORAL | Status: DC
  Administered 2013-09-15 – 2013-09-21 (×7): 40 mg via ORAL
  Filled 2013-09-14 (×8): qty 1

## 2013-09-14 MED ORDER — POTASSIUM CHLORIDE 10 MEQ/100ML IV SOLN
10.0000 meq | Freq: Once | INTRAVENOUS | Status: AC
  Administered 2013-09-14: 10 meq via INTRAVENOUS
  Filled 2013-09-14: qty 100

## 2013-09-14 NOTE — ED Provider Notes (Signed)
CSN: [id number]     Arrival date & time 09/14/13  1512 History   First MD Initiated Contact with Patient 09/14/13 1516     Chief Complaint  Patient presents with  . Drug Overdose     (Consider location/radiation/quality/duration/timing/severity/associated sxs/prior Treatment) HPI Rachael Ramirez is a 43 y.o. female who presents to emergency department after reportedly taking 24 tablets of 150 mg trazodone. This medication as prescribed to her. She denies taking it in order to overdose, states she took it because she wanted to get red of her chronic back pain. She denies taking any other medications today however states she took 10 Flexerils yesterday. She denies any complaints currently except for feeling drowsy and having back pain. Patient states she has had 6 back surgeries and is followed by Dr. Rubie Maidobell, with neurosurgery. Denies recent injuries. Denies new neurological symptoms. Currently denies SI or HI.  Past Medical History  Diagnosis Date  . Back pain   . Asthma     uses Combivent daily as needed  . Insomnia     takes Trazodone nightly  . Anxiety     takes Klonopin daily as needed  . PONV (postoperative nausea and vomiting)   . History of bronchitis   . Cough   . Spinal headache   . Weakness     left leg  . Chronic back pain     HNP and Radiculopathy  . GERD (gastroesophageal reflux disease)     takes Omeprazole daily  . Urinary urgency   . Bipolar 1 disorder     takes Lamictal daily  . Chronic low back pain 08/22/2013  . MRSA (methicillin resistant Staphylococcus aureus)     Postive nasal swab   Past Surgical History  Procedure Laterality Date  . Hand surgery Right 1992    has pins fracture  . Spinal fusion  1996  . Left arm surgery  1975    left elbow fracture  . Redo left arm surgery  2002  . Cervical fusion    . Back surgery      x 4 one with fusion  . Harware removal from back    . Epidural block injection    . Lumbar laminectomy/decompression  microdiscectomy Left 07/13/2013    Procedure: Left Lumbar four-five microdiskectomy;  Surgeon: Carmela HurtKyle L Cabbell, MD;  Location: [facility];  Service: Neurosurgery;  Laterality: Left;  Left Lumbar four-five microdiskectomy   Family History  Problem Relation Age of Onset  . Multiple sclerosis Mother   . COPD Father   . Alcohol abuse Maternal Grandfather   . Alcohol abuse Brother   . Cirrhosis Brother    History  Substance Use Topics  . Smoking status: Current Every Day Smoker -- 1.00 packs/day for 18 years  . Smokeless tobacco: Never Used  . Alcohol Use: No   OB History   Grav Para Term Preterm Abortions TAB SAB Ect Mult Living                 Review of Systems  Constitutional: Negative for fever and chills.  Respiratory: Negative for cough, chest tightness and shortness of breath.   Cardiovascular: Negative for chest pain, palpitations and leg swelling.  Gastrointestinal: Negative for nausea, vomiting, abdominal pain and diarrhea.  Genitourinary: Negative for dysuria, flank pain and pelvic pain.  Musculoskeletal: Positive for back pain. Negative for arthralgias, myalgias, neck pain and neck stiffness.  Skin: Negative for rash.  Neurological: Positive for weakness. Negative for dizziness and headaches.  All other systems reviewed and are negative.      Allergies  Minocycline  Home Medications   Current Outpatient Rx  Name  Route  Sig  Dispense  Refill  . albuterol-ipratropium (COMBIVENT) 18-103 MCG/ACT inhaler   Inhalation   Inhale 2 puffs into the lungs every 6 (six) hours as needed for wheezing or shortness of breath. For wheezing         . clonazePAM (KLONOPIN) 1 MG tablet   Oral   Take 1 mg by mouth 2 (two) times daily. For anxiety         . HYDROcodone-acetaminophen (NORCO) 10-325 MG per tablet   Oral   Take 1-2 tablets by mouth every 4 (four) hours as needed for moderate pain.         Marland Kitchen lamoTRIgine (LAMICTAL) 100 MG tablet   Oral   Take 100 mg by  mouth at bedtime.          . minocycline (MINOCIN,DYNACIN) 100 MG capsule   Oral   Take 1 capsule by mouth 2 (two) times daily.         Marland Kitchen omeprazole (PRILOSEC) 20 MG capsule   Oral   Take 20 mg by mouth daily as needed (for heartburn).          Marland Kitchen oxyCODONE-acetaminophen (PERCOCET/ROXICET) 5-325 MG per tablet   Oral   Take 1-2 tablets by mouth every 4 (four) hours as needed for severe pain.   15 tablet   0   . pregabalin (LYRICA) 300 MG capsule   Oral   Take 300 mg by mouth at bedtime.          . traZODone (DESYREL) 150 MG tablet   Oral   Take 150 mg by mouth at bedtime.          BP 139/92  Temp(Src) 98.1 F (36.7 C) (Oral)  Resp 14  SpO2 95%  LMP 09/02/2013 Physical Exam  Nursing note and vitals reviewed. Constitutional: She is oriented to person, place, and time. She appears well-developed and well-nourished.  Drowsy  HENT:  Head: Normocephalic.  Eyes: Conjunctivae are normal.  Pupils dilated, reactive, equal.  Neck: Neck supple.  Cardiovascular: Normal rate, regular rhythm and normal heart sounds.   Pulmonary/Chest: Effort normal and breath sounds normal. No respiratory distress. She has no wheezes. She has no rales.  Abdominal: Soft. Bowel sounds are normal. She exhibits no distension. There is no tenderness. There is no rebound.  Musculoskeletal: She exhibits no edema.  Neurological: She is alert and oriented to person, place, and time.  Gag reflex intact. Follows commands, 5/5 and equal upper and lower strength bilaterally. Poor coordination. drowsy  Skin: Skin is warm and dry.  Psychiatric: She has a normal mood and affect. Her behavior is normal.    ED Course  Procedures (including critical care time) Labs Review Labs Reviewed  CBC WITH DIFFERENTIAL  COMPREHENSIVE METABOLIC PANEL  ETHANOL  MAGNESIUM  ACETAMINOPHEN LEVEL  URINE RAPID DRUG SCREEN (HOSP PERFORMED)   Imaging Review No results found.   EKG Interpretation   Date/Time:   Friday September 14 2013 15:20:10 EDT Ventricular Rate:  84 PR Interval:  134 QRS Duration: 91 QT Interval:  432 QTC Calculation: 511 R Axis:   115 Text Interpretation:  Normal sinus rhythm Nonspecific T wave abnormality  Confirmed by Denton Lank  MD, Caryn Bee ([id number]) on 09/14/2013 3:24:30 PM      MDM   Final diagnoses:  Drug overdose  Hypokalemia  3:45 PM spoke with poison control, recommended monitor for at least 6 hrs. Possibility of seizures and arrhthymias. Check electrolytes, tylenol level, fluids. Supportive treatment at this time. Pt is currently protecting airway, she is drowsy, otherwise normal exam. VS normal.   5:40 PM Pt on the monitor, continues to be drowsy. Protecting airway. VS normal. Pt did have an episode of emesis, given 4mg  of zofran IV. Potassium low, ordered IVPB. Will continue to monitor. Will admit.   Spoke with [facility] hospitalist, will admit. Patient continues to be drowsy. He did advised to put patient on suicide protocol, patient does have history of suicidal attempts in the past. Orders placed. Filed Vitals:   09/14/13 1800 09/14/13 1815 09/14/13 1830 09/14/13 2100  BP: 113/83 112/76 113/71 152/80  Pulse: 58 61 61 65  Temp:    98.4 F (36.9 C)  TempSrc:    Oral  Resp: 12 13 12    Height:    5\' 3"  (1.6 m)  Weight:    94 lb 2.2 oz (42.7 kg)  SpO2: 89% 100% 100% 100%     Lottie Mussel, PA-C 09/15/13 0043

## 2013-09-14 NOTE — ED Notes (Signed)
Bed: WA15 Expected date:  Expected time:  Means of arrival:  Comments: EMS-OD 

## 2013-09-14 NOTE — H&P (Signed)
[facility] History and Physical  OLUKEMI PANCHAL EAV:[id number] DOB: March 06, 1971 DOA: 09/14/2013  Referring physician:  PCP: [name], MD  Specialists:   Chief Complaint: Drug overdose Intentional versus unintentional   HPI: Rachael Ramirez is a 43 y.o. WF PMHx depression, anxiety, bipolar disorder,Hx  suicide attempt 2014, chronic back pain, chronic compression fracture L1 with normal neural impingement, L4-5 herniated disc S/Plumbar discetomy at L4/5 on the left 07/13/2013 by Dr. Carmela Hurt (neurosurgery); Patient was discharged on clonazepam, cyclobenzaprine, Norco 10-325, Lamictal 100 mg, Lyrica 300 mg, trazodone 100 mg and instructions to followup with Dr. Carmela Hurt (neurosurgery) in 4 weeks. NOTE prior to surgery treated by Dr. Ollen Bowl in the pain management section.   Patient also seen by Dr. Stephanie Acre (neurology) on 08/22/2013 for chronic back pain at which point prednisone and Percocet/Norco was discontinued. Per husband patient did followup with  Dr. Carmela Hurt (neurosurgery) in February however her husband appointment ended in a shouting match between patient and physician secondary to patient stating continued to have 10/10 back pain. Her husband patient has a headache 05 back surgeries Dr. Carmela Hurt (neurosurgery) performed 3 surgeries, Dr. Ophelia Charter (neurosurgery) performed 2 surgeries approximately 6 years ago. Husband unsure of which medications are provided by her (psychiatrist Dr. Thea Silversmith, however her pain medication now being provided by Dr. Windle Guard (PCP). Husband states that patient has a drug abuse problem, and requests that help be provided. States that on Wednesday patient overdosed on unknown medication and he found her down on the floor in the kitchen after she had fallen and struck her fore head (no medical treatment sought at that time). Today patient called mother and told her she had consumed 28 tablets of trazodone and mother called 911,  and patient transported to ED. Husband states that to his knowledge no specific trauma initiated patient's back problems, and that currently although she does still have leg numbness and some back pain able to ambulate better than prior to surgery.    Review of Systems: Unable to obtain secondary to patient's lethargy     TRAVEL HISTORY:   Procedure MRI L-spine with and without contrast 08/23/2013 Status post recent left L4-5 hemilaminectomy for apparent  microdiscectomy, with enhancing soft tissue within the lateral  recess favoring hematoma and granulation tissue, superimposed  infectious process is less likely though, not excluded.  Increasing acute discogenic endplate changes towards the left at  L4-5 likely secondary to recent surgery without MR findings of  discitis osteomyelitis.  21 x 30 x 16 mm subcutaneous fluid collection within the midline  paraspinal soft tissues at L3 through L4, which could reflect  abscess or possibly degenerating hematoma. Recommend correlation  with fluid sampling if clinically indicated.    Antibiotics   Consultation    Past Medical History  Diagnosis Date  . Back pain   . Asthma     uses Combivent daily as needed  . Insomnia     takes Trazodone nightly  . Anxiety     takes Klonopin daily as needed  . PONV (postoperative nausea and vomiting)   . History of bronchitis   . Cough   . Spinal headache   . Weakness     left leg  . Chronic back pain     HNP and Radiculopathy  . GERD (gastroesophageal reflux disease)     takes Omeprazole daily  . Urinary urgency   . Bipolar 1 disorder     takes Lamictal  daily  . Chronic low back pain 08/22/2013  . MRSA (methicillin resistant Staphylococcus aureus)     Postive nasal swab   Past Surgical History  Procedure Laterality Date  . Hand surgery Right 1992    has pins fracture  . Spinal fusion  1996  . Left arm surgery  1975    left elbow fracture  . Redo left arm surgery  2002  .  Cervical fusion    . Back surgery      x 4 one with fusion  . Harware removal from back    . Epidural block injection    . Lumbar laminectomy/decompression microdiscectomy Left 07/13/2013    Procedure: Left Lumbar four-five microdiskectomy;  Surgeon: Carmela Hurt, MD;  Location: [facility];  Service: Neurosurgery;  Laterality: Left;  Left Lumbar four-five microdiskectomy   Social History:  Smokes 1 PPD x20 years, negative ETOH, negative equal drugs. where does patient live--home, ALF, SNF? Husband   Can patient participate in ADLs? No  Allergies  Allergen Reactions  . Minocycline Nausea And Vomiting    Family History  Problem Relation Age of Onset  . Multiple sclerosis Mother   . COPD Father   . Alcohol abuse Maternal Grandfather   . Alcohol abuse Brother   . Cirrhosis Brother      Prior to Admission medications   Medication Sig Start Date End Date Taking? Authorizing Provider  albuterol-ipratropium (COMBIVENT) 18-103 MCG/ACT inhaler Inhale 2 puffs into the lungs every 6 (six) hours as needed for wheezing or shortness of breath. For wheezing    Historical Provider, MD  clonazePAM (KLONOPIN) 1 MG tablet Take 1 mg by mouth 2 (two) times daily. For anxiety    Historical Provider, MD  HYDROcodone-acetaminophen (NORCO) 10-325 MG per tablet Take 1-2 tablets by mouth every 4 (four) hours as needed for moderate pain.    Historical Provider, MD  lamoTRIgine (LAMICTAL) 100 MG tablet Take 100 mg by mouth at bedtime.     Historical Provider, MD  minocycline (MINOCIN,DYNACIN) 100 MG capsule Take 1 capsule by mouth 2 (two) times daily. 08/30/13   Historical Provider, MD  omeprazole (PRILOSEC) 20 MG capsule Take 20 mg by mouth daily as needed (for heartburn).     Historical Provider, MD  oxyCODONE-acetaminophen (PERCOCET/ROXICET) 5-325 MG per tablet Take 1-2 tablets by mouth every 4 (four) hours as needed for severe pain. 09/05/13   Shari A Upstill, PA-C  pregabalin (LYRICA) 300 MG capsule Take  300 mg by mouth at bedtime.     Historical Provider, MD  traZODone (DESYREL) 150 MG tablet Take 150 mg by mouth at bedtime.    Historical Provider, MD   Physical Exam: Filed Vitals:   09/14/13 1745 09/14/13 1800 09/14/13 1815 09/14/13 1830  BP: 115/69 113/83 112/76 113/71  Pulse: 62 58 61 61  Temp:      TempSrc:      Resp: 13 12 13 12   SpO2: 100% 89% 100% 100%     General:  Lethargic, difficult to arouse; however was able to answer where, when,why  Eyes: Sluggishly responsive to light  Neck: Negative JVD, negative lymphadenopathy  Cardiovascular: Regular rhythm and rate, negative murmurs rubs or gallops, DP/PT pulse one plus  Respiratory: Diffuse inspiratory/expiratory wheezing  Abdomen: Soft, nontender, nondistended, plus bowel sound  Skin: Cover with tattoos, vertical healed/marked on left wrist (consistent with attempted suicide)  Musculoskeletal: Negative pedal edema  Psychiatric: Unable to assess  Neurologic: Unable to assess  Labs on Admission:  Basic Metabolic Panel:  Recent Labs Lab 09/14/13 1602  NA 142  K 3.2*  CL 99  CO2 28  GLUCOSE 139*  BUN 5*  CREATININE 0.53  CALCIUM 9.4  MG 2.3   Liver Function Tests:  Recent Labs Lab 09/14/13 1602  AST 12  ALT 5  ALKPHOS 166*  BILITOT 0.4  PROT 7.0  ALBUMIN 2.9*   No results found for this basename: LIPASE, AMYLASE,  in the last 168 hours No results found for this basename: AMMONIA,  in the last 168 hours CBC:  Recent Labs Lab 09/14/13 1602  WBC 7.8  NEUTROABS 5.8  HGB 13.1  HCT 39.2  MCV 102.9*  PLT 366   Cardiac Enzymes: No results found for this basename: CKTOTAL, CKMB, CKMBINDEX, TROPONINI,  in the last 168 hours  BNP (last 3 results) No results found for this basename: PROBNP,  in the last 8760 hours CBG: No results found for this basename: GLUCAP,  in the last 168 hours  Radiological Exams on Admission: No results found.  EKG: NSR, right axis deviation, nonspecific ST-T  wave changes in leads 1, aVL, V1-V4 cannot rule out anterior septal/lateral ischemia  Assessment/Plan Active Problems:   Bipolar disorder, unspecified   Chronic low back pain   Drug overdose   History of suicide attempt   Depression   Anxiety   Compression fracture of L1 lumbar vertebra with delayed healing   Lumbar herniated disc     Drug overdose -Patient attempted suicide last year and was committed to [facility] per her husband. Per husband patient currently has a drug abuse problem, and believes patient needs an inpatient program i.e. involuntary commitment. He requests that psychiatry contact him after they evaluate -Consult psychiatry once patient's mental status improves -Continue to monitor for arrhythmias, respiratory depression secondary to trazodone overdose   Suicide attempt/history of suicide -See drug overdose -Patient on suicide watch   Abnormal EKG -Obtain troponin x3 -Obtain a.m. EKG -Admit to telemetry -Echocardiogram  Bipolar -Patient on Lamictal QHS will hold -Await psychiatry input - Depression -Patient was on trazodone however secondary to overdose -Await psychiatry input  Anxiety -Will patient on clonazepam 1 mg BID , however secondary to sedation will hold -Await psychiatry input  Chronic low back pain -Abnormalities on recent MRI -Consult neurosurgery  Compression fracture L1 -Consult neurosurgery   Code Status: Full Family Communication: Husband Fanny DanceJeff Cake [phone] present for discussion of plan. Would like to be contacted by psychiatry when they are making their evaluation  Disposition Plan: Per psychiatry/neurosurgery  Time spent: 60 minute  WOODS, Roselind MessierCURTIS, J [facility] Pager [phone]  If 7PM-7AM, please contact night-coverage [email or web address] Password [email or web address] 09/14/2013, 8:32 PM

## 2013-09-14 NOTE — ED Notes (Signed)
MD at bedside. 

## 2013-09-14 NOTE — ED Notes (Signed)
Per EMS pt coming from home with c/o drug overdose.Pt reportedly took 24 of 150 mg trazodone today. Pt sts she was not trying to hurt herself but wanted relief from back pain. EMS reports pt overdosed on flexeril yesterday (took 14 tablets) also for back pain but was not brought to hospital. Per EMS VSS enroute, pt lethargic but easily aroused on arrival, denies SI

## 2013-09-15 ENCOUNTER — Inpatient Hospital Stay (HOSPITAL_COMMUNITY): Payer: Managed Care, Other (non HMO)

## 2013-09-15 ENCOUNTER — Encounter (HOSPITAL_COMMUNITY): Payer: Self-pay | Admitting: Psychiatry

## 2013-09-15 DIAGNOSIS — F1910 Other psychoactive substance abuse, uncomplicated: Secondary | ICD-10-CM

## 2013-09-15 DIAGNOSIS — R9431 Abnormal electrocardiogram [ECG] [EKG]: Secondary | ICD-10-CM

## 2013-09-15 DIAGNOSIS — F3130 Bipolar disorder, current episode depressed, mild or moderate severity, unspecified: Secondary | ICD-10-CM

## 2013-09-15 LAB — CBC WITH DIFFERENTIAL/PLATELET
BASOS ABS: 0 10*3/uL (ref 0.0–0.1)
BASOS PCT: 0 % (ref 0–1)
EOS PCT: 0 % (ref 0–5)
Eosinophils Absolute: 0 10*3/uL (ref 0.0–0.7)
HEMATOCRIT: 33.6 % — AB (ref 36.0–46.0)
HEMOGLOBIN: 11.3 g/dL — AB (ref 12.0–15.0)
Lymphocytes Relative: 17 % (ref 12–46)
Lymphs Abs: 1.3 10*3/uL (ref 0.7–4.0)
MCH: 34.5 pg — ABNORMAL HIGH (ref 26.0–34.0)
MCHC: 33.6 g/dL (ref 30.0–36.0)
MCV: 102.4 fL — AB (ref 78.0–100.0)
MONO ABS: 0.4 10*3/uL (ref 0.1–1.0)
MONOS PCT: 6 % (ref 3–12)
NEUTROS ABS: 5.7 10*3/uL (ref 1.7–7.7)
Neutrophils Relative %: 77 % (ref 43–77)
Platelets: 355 10*3/uL (ref 150–400)
RBC: 3.28 MIL/uL — ABNORMAL LOW (ref 3.87–5.11)
RDW: 14.3 % (ref 11.5–15.5)
WBC: 7.5 10*3/uL (ref 4.0–10.5)

## 2013-09-15 LAB — COMPREHENSIVE METABOLIC PANEL
ALBUMIN: 2.4 g/dL — AB (ref 3.5–5.2)
ALK PHOS: 134 U/L — AB (ref 39–117)
ALT: <5 U/L (ref 0–35)
AST: 9 U/L (ref 0–37)
CHLORIDE: 104 meq/L (ref 96–112)
CO2: 27 mEq/L (ref 19–32)
Calcium: 8.1 mg/dL — ABNORMAL LOW (ref 8.4–10.5)
Creatinine, Ser: 0.55 mg/dL (ref 0.50–1.10)
GFR calc Af Amer: >90 mL/min (ref >90)
GFR calc non Af Amer: >90 mL/min (ref >90)
Glucose, Bld: 97 mg/dL (ref 70–99)
Potassium: 3.6 mEq/L — ABNORMAL LOW (ref 3.7–5.3)
Sodium: 142 mEq/L (ref 137–147)
Total Bilirubin: 0.4 mg/dL (ref 0.3–1.2)
Total Protein: 6.2 g/dL (ref 6.0–8.3)

## 2013-09-15 LAB — TROPONIN I: Troponin I: <0.3 ng/mL (ref <0.30)

## 2013-09-15 MED ORDER — CLONAZEPAM 0.5 MG PO TABS
0.5000 mg | ORAL_TABLET | Freq: Two times a day (BID) | ORAL | Status: DC
  Administered 2013-09-15 – 2013-09-23 (×17): 0.5 mg via ORAL
  Filled 2013-09-15 (×16): qty 1

## 2013-09-15 MED ORDER — DULOXETINE HCL 30 MG PO CPEP
30.0000 mg | ORAL_CAPSULE | Freq: Every day | ORAL | Status: DC
  Administered 2013-09-15 – 2013-09-16 (×2): 30 mg via ORAL
  Filled 2013-09-15 (×7): qty 1

## 2013-09-15 MED ORDER — PREGABALIN 50 MG PO CAPS
100.0000 mg | ORAL_CAPSULE | Freq: Every day | ORAL | Status: DC
  Administered 2013-09-15 – 2013-09-16 (×2): 100 mg via ORAL
  Filled 2013-09-15 (×2): qty 2

## 2013-09-15 MED ORDER — ONDANSETRON HCL 4 MG/2ML IJ SOLN
4.0000 mg | Freq: Four times a day (QID) | INTRAMUSCULAR | Status: DC | PRN
  Administered 2013-09-15 – 2013-09-17 (×9): 4 mg via INTRAVENOUS
  Filled 2013-09-15 (×9): qty 2

## 2013-09-15 MED ORDER — OXYCODONE-ACETAMINOPHEN 5-325 MG PO TABS
1.0000 | ORAL_TABLET | Freq: Three times a day (TID) | ORAL | Status: DC | PRN
  Administered 2013-09-15 – 2013-09-17 (×6): 1 via ORAL
  Filled 2013-09-15 (×6): qty 1

## 2013-09-15 MED ORDER — ACETAMINOPHEN 325 MG PO TABS
650.0000 mg | ORAL_TABLET | Freq: Four times a day (QID) | ORAL | Status: DC | PRN
  Administered 2013-09-15 – 2013-09-23 (×4): 650 mg via ORAL
  Filled 2013-09-15 (×4): qty 2

## 2013-09-15 MED ORDER — LAMOTRIGINE 100 MG PO TABS
100.0000 mg | ORAL_TABLET | Freq: Every day | ORAL | Status: DC
  Administered 2013-09-15 – 2013-09-23 (×9): 100 mg via ORAL
  Filled 2013-09-15 (×10): qty 1

## 2013-09-15 NOTE — Progress Notes (Signed)
[facility] PROGRESS NOTE  Rachael BushyRachel L Bohlin ZOX:[id number] DOB: 06/23/1971 DOA: 09/14/2013 PCP: Kaleen MaskELKINS,WILSON OLIVER, MD  Assessment/Plan: Active Problems:  Bipolar disorder, unspecified  Chronic low back pain  Drug overdose  History of suicide attempt  Depression  Anxiety  Compression fracture of L1 lumbar vertebra with delayed healing  Lumbar herniated disc  43 y.o. WF PMHx depression, anxiety, bipolar disorder,Hx suicide attempt 2014, chronic back pain, multiple spinal surgeries presented with medication overdose, consumed 28 tablets of trazodone and mother called 911  1. Drug overdose   -clinically stable; mental status at baseline; neuro exam non focal;  -Patient attempted suicide last year and was committed to [facility] per her husband. Per husband patient currently has a drug abuse problem, and believes patient needs an inpatient program i.e. involuntary commitment. He requests that psychiatry contact him after they evaluate  -Consulted psychiatry; ED contacted poison control, recommended monitor for at least 6 hrs; ECG NSR: trop negative;  pend echo   2. Suicide attempt/history of suicide; as above  -Patient on suicide watch   3. Bipolar; resume Lamictal;  -Await psychiatry input   4. Depression Patient was on trazodone however secondary to overdose  -Await psychiatry input   5. Anxiety patient on clonazepam; resume to prevent withdrawal  -Await psychiatry input   6. Chronic low back pain; d/w patient: no change in symptoms; neuro exam no focal, sensation is intact;  -cont pain control ; she wants to follow up with neurosurgery as outpatient   7. Cough; h/o COPD; obtain CXR; cont bronchodilators   Code Status: full Family Communication: d/w patient (indicate person spoken with, relationship, and if by phone, the number) Disposition Plan: pend clinical improvement    Consultants:  Psychiatry   Procedures:  none  Antibiotics:  None  (indicate start date,  and stop date if known)  HPI/Subjective: alert  Objective: Filed Vitals:   09/15/13 0829  BP: 136/91  Pulse: 76  Temp: 98.4 F (36.9 C)  Resp: 16    Intake/Output Summary (Last 24 hours) at 09/15/13 0958 Last data filed at 09/15/13 0905  Gross per 24 hour  Intake      0 ml  Output   2525 ml  Net  -2525 ml   Filed Weights   09/14/13 2100  Weight: 42.7 kg (94 lb 2.2 oz)    Exam:   General:  alert  Cardiovascular: s1,s2 rrr  Respiratory: few wheezing in LL  Abdomen: soft, nt,nd   Musculoskeletal: no LE edema    Data Reviewed: Basic Metabolic Panel:  Recent Labs Lab 09/14/13 1602 09/14/13 2119 09/15/13 0235  NA 142  --  142  K 3.2*  --  3.6*  CL 99  --  104  CO2 28  --  27  GLUCOSE 139*  --  97  BUN 5*  --  <3*  CREATININE 0.53 0.58 0.55  CALCIUM 9.4  --  8.1*  MG 2.3 2.1  --    Liver Function Tests:  Recent Labs Lab 09/14/13 1602 09/15/13 0235  AST 12 9  ALT 5 <5  ALKPHOS 166* 134*  BILITOT 0.4 0.4  PROT 7.0 6.2  ALBUMIN 2.9* 2.4*   No results found for this basename: LIPASE, AMYLASE,  in the last 168 hours No results found for this basename: AMMONIA,  in the last 168 hours CBC:  Recent Labs Lab 09/14/13 1602 09/14/13 2119 09/15/13 0235  WBC 7.8 6.8 7.5  NEUTROABS 5.8  --  5.7  HGB 13.1 11.4*  11.3*  HCT 39.2 33.6* 33.6*  MCV 102.9* 102.1* 102.4*  PLT 366 359 355   Cardiac Enzymes:  Recent Labs Lab 09/14/13 2119 09/15/13 0235 09/15/13 0845  TROPONINI <0.30 <0.30 <0.30   BNP (last 3 results) No results found for this basename: PROBNP,  in the last 8760 hours CBG: No results found for this basename: GLUCAP,  in the last 168 hours  No results found for this or any previous visit (from the past 240 hour(s)).   Studies: No results found.  Scheduled Meds: . sodium chloride   Intravenous STAT  . enoxaparin (LOVENOX) injection  40 mg Subcutaneous Q24H  . nicotine  21 mg Transdermal Daily  . pantoprazole  40 mg Oral  Daily   Continuous Infusions: . sodium chloride 100 mL/hr at 09/15/13 1245    Active Problems:   Bipolar disorder, unspecified   Chronic low back pain   Drug overdose   History of suicide attempt   Depression   Anxiety   Compression fracture of L1 lumbar vertebra with delayed healing   Lumbar herniated disc   Abnormal EKG   Suicide attempt by drug ingestion    Time spent: >35 minutes     Esperanza Sheets  [facility] Pager [phone]. If 7PM-7AM, please contact night-coverage at [email or web address], password [facility] 09/15/2013, 9:58 AM  LOS: 1 day
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
## 2013-09-15 NOTE — Progress Notes (Signed)
UR completed 
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
## 2013-09-15 NOTE — Progress Notes (Signed)
  Echocardiogram 2D Echocardiogram has been performed.  Arvil ChacoFoster, [place] 09/15/2013, 11:20 AM

## 2013-09-16 MED ORDER — PREGABALIN 100 MG PO CAPS
200.0000 mg | ORAL_CAPSULE | Freq: Every day | ORAL | Status: DC
  Administered 2013-09-17 – 2013-09-23 (×7): 200 mg via ORAL
  Filled 2013-09-16 (×8): qty 2
  Filled 2013-09-16: qty 4

## 2013-09-16 MED ORDER — MORPHINE SULFATE 2 MG/ML IJ SOLN
2.0000 mg | INTRAMUSCULAR | Status: DC | PRN
  Administered 2013-09-16 – 2013-09-23 (×35): 2 mg via INTRAVENOUS
  Filled 2013-09-16 (×36): qty 1

## 2013-09-16 MED ORDER — KETOROLAC TROMETHAMINE 15 MG/ML IJ SOLN
15.0000 mg | Freq: Three times a day (TID) | INTRAMUSCULAR | Status: DC | PRN
  Administered 2013-09-16 – 2013-09-20 (×4): 15 mg via INTRAVENOUS
  Filled 2013-09-16 (×4): qty 1

## 2013-09-16 MED ORDER — SENNOSIDES-DOCUSATE SODIUM 8.6-50 MG PO TABS
1.0000 | ORAL_TABLET | Freq: Two times a day (BID) | ORAL | Status: DC
  Administered 2013-09-16 – 2013-09-23 (×15): 1 via ORAL
  Filled 2013-09-16 (×22): qty 1

## 2013-09-16 MED ORDER — ENSURE COMPLETE PO LIQD
237.0000 mL | Freq: Three times a day (TID) | ORAL | Status: DC
  Administered 2013-09-16 – 2013-09-23 (×10): 237 mL via ORAL

## 2013-09-16 MED ORDER — AMLODIPINE BESYLATE 5 MG PO TABS
5.0000 mg | ORAL_TABLET | Freq: Every day | ORAL | Status: DC
  Administered 2013-09-16 – 2013-09-17 (×2): 5 mg via ORAL
  Filled 2013-09-16 (×4): qty 1

## 2013-09-16 NOTE — ED Provider Notes (Signed)
Medical screening examination/treatment/procedure(s) were conducted as a shared visit with non-physician practitioner(s) and myself.  I personally evaluated the patient during the encounter.   EKG Interpretation   Date/Time:  Friday September 14 2013 15:20:10 EDT Ventricular Rate:  84 PR Interval:  134 QRS Duration: 91 QT Interval:  432 QTC Calculation: 511 R Axis:   115 Text Interpretation:  Normal sinus rhythm Nonspecific T wave abnormality  Confirmed by Denton Lank  MD, Caryn Bee ([id number]) on 09/14/2013 3:24:30 PM      Pt s/p overdose of trazodone.  Had taken numerous tablets. Pt arrives to ED obtunded/markedly altered mental status.   Pt w intact gag, clears throat.   Poison control contacted, rec close obs, monitor for seizures, otherwise no specific tx recommended.   pts mental status remains persistently altered. On multiple rechecks, maintaining o2 sats, is protecting airway.   Labs. Continuous pulse ox and monitoring. Neuro checks.   Medical service contact for admission, stepdown.  CRITICAL CARE  RE drug overdose, markedly altered mental status/obtunded Performed by: Suzi Roots Total critical care time: 40 Critical care time was exclusive of separately billable procedures and treating other patients. Critical care was necessary to treat or prevent imminent or life-threatening deterioration. Critical care was time spent personally by me on the following activities: development of treatment plan with patient and/or surrogate as well as nursing, discussions with consultants, evaluation of patient's response to treatment, examination of patient, obtaining history from patient or surrogate, ordering and performing treatments and interventions, ordering and review of laboratory studies, ordering and review of radiographic studies, pulse oximetry and re-evaluation of patient's condition.    Results for orders placed during the hospital encounter of 09/14/13  CBC WITH DIFFERENTIAL       Result Value Ref Range   WBC 7.8  4.0 - 10.5 K/uL   RBC 3.81 (*) 3.87 - 5.11 MIL/uL   Hemoglobin 13.1  12.0 - 15.0 g/dL   HCT 96.2  95.2 - 84.1 %   MCV 102.9 (*) 78.0 - 100.0 fL   MCH 34.4 (*) 26.0 - 34.0 pg   MCHC 33.4  30.0 - 36.0 g/dL   RDW 32.4  40.1 - 02.7 %   Platelets 366  150 - 400 K/uL   Neutrophils Relative % 74  43 - 77 %   Neutro Abs 5.8  1.7 - 7.7 K/uL   Lymphocytes Relative 20  12 - 46 %   Lymphs Abs 1.5  0.7 - 4.0 K/uL   Monocytes Relative 6  3 - 12 %   Monocytes Absolute 0.4  0.1 - 1.0 K/uL   Eosinophils Relative 0  0 - 5 %   Eosinophils Absolute 0.0  0.0 - 0.7 K/uL   Basophils Relative 0  0 - 1 %   Basophils Absolute 0.0  0.0 - 0.1 K/uL  COMPREHENSIVE METABOLIC PANEL      Result Value Ref Range   Sodium 142  137 - 147 mEq/L   Potassium 3.2 (*) 3.7 - 5.3 mEq/L   Chloride 99  96 - 112 mEq/L   CO2 28  19 - 32 mEq/L   Glucose, Bld 139 (*) 70 - 99 mg/dL   BUN 5 (*) 6 - 23 mg/dL   Creatinine, Ser 2.53  0.50 - 1.10 mg/dL   Calcium 9.4  8.4 - 66.4 mg/dL   Total Protein 7.0  6.0 - 8.3 g/dL   Albumin 2.9 (*) 3.5 - 5.2 g/dL   AST 12  0 - 37 U/L  ALT 5  0 - 35 U/L   Alkaline Phosphatase 166 (*) 39 - 117 U/L   Total Bilirubin 0.4  0.3 - 1.2 mg/dL   GFR calc non Af Amer >90  >90 mL/min   GFR calc Af Amer >90  >90 mL/min  ETHANOL      Result Value Ref Range   Alcohol, Ethyl (B) <11  0 - 11 mg/dL  MAGNESIUM      Result Value Ref Range   Magnesium 2.3  1.5 - 2.5 mg/dL  ACETAMINOPHEN LEVEL      Result Value Ref Range   Acetaminophen (Tylenol), Serum <15.0  10 - 30 ug/mL  URINE RAPID DRUG SCREEN (HOSP PERFORMED)      Result Value Ref Range   Opiates NONE DETECTED  NONE DETECTED   Cocaine NONE DETECTED  NONE DETECTED   Benzodiazepines NONE DETECTED  NONE DETECTED   Amphetamines NONE DETECTED  NONE DETECTED   Tetrahydrocannabinol NONE DETECTED  NONE DETECTED   Barbiturates NONE DETECTED  NONE DETECTED  SALICYLATE LEVEL      Result Value Ref Range   Salicylate Lvl  <2.0 (*) 2.8 - 20.0 mg/dL  CBC      Result Value Ref Range   WBC 6.8  4.0 - 10.5 K/uL   RBC 3.29 (*) 3.87 - 5.11 MIL/uL   Hemoglobin 11.4 (*) 12.0 - 15.0 g/dL   HCT 47.833.6 (*) 29.536.0 - 62.146.0 %   MCV 102.1 (*) 78.0 - 100.0 fL   MCH 34.7 (*) 26.0 - 34.0 pg   MCHC 33.9  30.0 - 36.0 g/dL   RDW 30.814.2  65.711.5 - 84.615.5 %   Platelets 359  150 - 400 K/uL  CREATININE, SERUM      Result Value Ref Range   Creatinine, Ser 0.58  0.50 - 1.10 mg/dL   GFR calc non Af Amer >90  >90 mL/min   GFR calc Af Amer >90  >90 mL/min  COMPREHENSIVE METABOLIC PANEL      Result Value Ref Range   Sodium 142  137 - 147 mEq/L   Potassium 3.6 (*) 3.7 - 5.3 mEq/L   Chloride 104  96 - 112 mEq/L   CO2 27  19 - 32 mEq/L   Glucose, Bld 97  70 - 99 mg/dL   BUN <3 (*) 6 - 23 mg/dL   Creatinine, Ser 9.620.55  0.50 - 1.10 mg/dL   Calcium 8.1 (*) 8.4 - 10.5 mg/dL   Total Protein 6.2  6.0 - 8.3 g/dL   Albumin 2.4 (*) 3.5 - 5.2 g/dL   AST 9  0 - 37 U/L   ALT <5  0 - 35 U/L   Alkaline Phosphatase 134 (*) 39 - 117 U/L   Total Bilirubin 0.4  0.3 - 1.2 mg/dL   GFR calc non Af Amer >90  >90 mL/min   GFR calc Af Amer >90  >90 mL/min  MAGNESIUM      Result Value Ref Range   Magnesium 2.1  1.5 - 2.5 mg/dL  CBC WITH DIFFERENTIAL      Result Value Ref Range   WBC 7.5  4.0 - 10.5 K/uL   RBC 3.28 (*) 3.87 - 5.11 MIL/uL   Hemoglobin 11.3 (*) 12.0 - 15.0 g/dL   HCT 95.233.6 (*) 84.136.0 - 32.446.0 %   MCV 102.4 (*) 78.0 - 100.0 fL   MCH 34.5 (*) 26.0 - 34.0 pg   MCHC 33.6  30.0 - 36.0 g/dL   RDW 14.3  11.5 - 15.5 %   Platelets 355  150 - 400 K/uL   Neutrophils Relative % 77  43 - 77 %   Neutro Abs 5.7  1.7 - 7.7 K/uL   Lymphocytes Relative 17  12 - 46 %   Lymphs Abs 1.3  0.7 - 4.0 K/uL   Monocytes Relative 6  3 - 12 %   Monocytes Absolute 0.4  0.1 - 1.0 K/uL   Eosinophils Relative 0  0 - 5 %   Eosinophils Absolute 0.0  0.0 - 0.7 K/uL   Basophils Relative 0  0 - 1 %   Basophils Absolute 0.0  0.0 - 0.1 K/uL  TROPONIN I      Result Value Ref  Range   Troponin I <0.30  <0.30 ng/mL  TROPONIN I      Result Value Ref Range   Troponin I <0.30  <0.30 ng/mL  TROPONIN I      Result Value Ref Range   Troponin I <0.30  <0.30 ng/mL   Mr Lumbar Spine W Wo Contrast  08/23/2013   CLINICAL DATA:  Status post diskectomy July 13, 2013, 4 days of drainage from the surgical site, reported to have abscess requiring surgical debridement.  EXAM: MRI LUMBAR SPINE WITHOUT AND WITH CONTRAST  TECHNIQUE: Multiplanar and multiecho pulse sequences of the lumbar spine were obtained without and with intravenous contrast.  CONTRAST:  9mL MULTIHANCE GADOBENATE DIMEGLUMINE 529 MG/ML IV SOLN  COMPARISON:  DG LUMBAR SPINE 2-3 VIEWS dated 07/13/2013; MR LUMBAR SPINE WO/W CM dated 05/17/2013  FINDINGS: Status post apparent interval left L4-5 hemilaminectomy, left L5 and S1 pedicle screws were present previously, with L4-5 interbody disc material and L5-S1 laminectomies. Interval L4-5 discectomy, with abnormal enhancing bright STIR signal within the left L4-5 lateral recess, effacing the left lateral recess, which may affect the traversing left L5 nerve. 21 x 13 x 60mm (transverse by AP by CC) paraspinal fluid collection within the central subcutaneous fat overlying the spinous process of L3 and L4, with ill-defined rim enhancement, and possible cutaneous tract. There is mild additional enhancement within the paraspinal soft tissues associated with denervation with severe paraspinal muscle atrophy at the level of sacrum consistent with remote surgical intervention. No suspicious epidural fluid collections.  Acute moderate to severe discogenic endplate changes towards the left at L4-5, with mild geographic enhancement. No suspicious osseous or intradiscal enhancement.  Moderate chronic L1 burst fracture, as previously seen with similar retropulsed bony fragments, resulting in mild canal stenosis. Conus medullaris terminates at L1-2 and appears normal morphology and signal  characteristics. Cauda equina is unremarkable. No abnormal cord or leptomeningeal enhancement. No nerve root clumping. Included prevertebral soft tissues are unremarkable.  Level by level evaluation:  L1-2, L2-3, L3-4: No disc bulge, canal stenosis or neural foraminal narrowing.  L4-5: Status post left hemilaminectomy with posterior decompression. No canal stenosis. Possible mild neural foraminal narrowing which may be overestimated by hardware artifact.  L5-S1. Subcentimeter right L5 perineural cyst again seen. Status post laminectomy and posterior decompression. No canal stenosis or neural foraminal narrowing.  IMPRESSION: Status post recent left L4-5 hemilaminectomy for apparent microdiscectomy, with enhancing soft tissue within the lateral recess favoring hematoma and granulation tissue, superimposed infectious process is less likely though, not excluded.  Increasing acute discogenic endplate changes towards the left at L4-5 likely secondary to recent surgery without MR findings of discitis osteomyelitis.  21 x 30 x 16 mm subcutaneous fluid collection within the midline paraspinal soft tissues at L3 through  L4, which could reflect abscess or possibly degenerating hematoma. Recommend correlation with fluid sampling if clinically indicated.   Electronically Signed   By: Awilda Metro   On: 08/23/2013 18:42   Dg Chest Port 1 View  09/15/2013   CLINICAL DATA:  Cough  EXAM: PORTABLE CHEST - 1 VIEW  COMPARISON:  07/11/2013  FINDINGS: Lungs are clear. No pleural effusion or pneumothorax.  The heart is normal in size.  IMPRESSION: No evidence of acute cardiopulmonary disease.   Electronically Signed   By: Charline Bills M.D.   On: 09/15/2013 11:37      Suzi Roots, MD 09/16/13 [phone]

## 2013-09-16 NOTE — Progress Notes (Signed)
INITIAL NUTRITION ASSESSMENT  DOCUMENTATION CODES Per approved criteria  -Severe malnutrition in the context of chronic illness   INTERVENTION: - Ensure Complete PO TID, each provides 350 kcal, 13 g protein - Encourage adequate PO intake, with emphasis on easy to chew/swallow foods.   NUTRITION DIAGNOSIS: Inadequate oral intake related to pain as evidenced by minimal PO intake.   Goal: Patient will meet >/=90% of estimated nutrition needs  Monitor:  PO intake, weight, labs  Reason for Assessment: Malnutrition screening tool  43 y.o. female  Admitting Dx: Drug overdose  ASSESSMENT: Patient with history of depression, anxiety, bipolar disorder, lumbar discectomy L4/5 in January 2015. She has chronic back pain. Admitted with drug overdose. She does have a history of drug abuse.   Patient is refusing to drink much due to pain. However, she is drinking fluids and is open to nutrition supplements. Her weight is down 6% in 1 month and she has evidence of moderate muscle wasting at the clavicle. However, no evidence of significant subcutaneous fat or muscle wasting at other regions.    Height: Ht Readings from Last 1 Encounters:  09/14/13 5\' 3"  (1.6 m)    Weight: Wt Readings from Last 1 Encounters:  09/14/13 94 lb 2.2 oz (42.7 kg)    Ideal Body Weight: 115 pounds  % Ideal Body Weight: 82%  Wt Readings from Last 10 Encounters:  09/14/13 94 lb 2.2 oz (42.7 kg)  08/23/13 100 lb (45.36 kg)  08/22/13 106 lb (48.081 kg)  07/11/13 102 lb 1.6 oz (46.312 kg)  12/23/11 98 lb (44.453 kg)  11/30/11 103 lb (46.72 kg)    Usual Body Weight: 100-105 pounds  % Usual Body Weight: 90-94%  BMI:  Body mass index is 16.68 kg/(m^2). Patient is underweight.   Estimated Nutritional Needs: Kcal: 1450-1675 kcal Protein: 60-70 g Fluid: >1.5 L/day  Skin: Intact  Diet Order: General  EDUCATION NEEDS: -No education needs identified at this time   Intake/Output Summary (Last 24  hours) at 09/16/13 1146 Last data filed at 09/16/13 0918  Gross per 24 hour  Intake     85 ml  Output   2300 ml  Net  -2215 ml    Last BM: 3/20   Labs:   Recent Labs Lab 09/14/13 1602 09/14/13 2119 09/15/13 0235  NA 142  --  142  K 3.2*  --  3.6*  CL 99  --  104  CO2 28  --  27  BUN 5*  --  <3*  CREATININE 0.53 0.58 0.55  CALCIUM 9.4  --  8.1*  MG 2.3 2.1  --   GLUCOSE 139*  --  97    CBG (last 3)  No results found for this basename: GLUCAP,  in the last 72 hours  Scheduled Meds: . amLODipine  5 mg Oral Daily  . clonazePAM  0.5 mg Oral BID  . DULoxetine  30 mg Oral Daily  . enoxaparin (LOVENOX) injection  40 mg Subcutaneous Q24H  . lamoTRIgine  100 mg Oral Daily  . nicotine  21 mg Transdermal Daily  . pantoprazole  40 mg Oral Daily  . [START ON 09/17/2013] pregabalin  200 mg Oral Daily  . senna-docusate  1 tablet Oral BID    Continuous Infusions:   Past Medical History  Diagnosis Date  . Back pain   . Asthma     uses Combivent daily as needed  . Insomnia     takes Trazodone nightly  . Anxiety  takes Klonopin daily as needed  . PONV (postoperative nausea and vomiting)   . History of bronchitis   . Cough   . Spinal headache   . Weakness     left leg  . Chronic back pain     HNP and Radiculopathy  . GERD (gastroesophageal reflux disease)     takes Omeprazole daily  . Urinary urgency   . Bipolar 1 disorder     takes Lamictal daily  . Chronic low back pain 08/22/2013  . MRSA (methicillin resistant Staphylococcus aureus)     Postive nasal swab    Past Surgical History  Procedure Laterality Date  . Hand surgery Right 1992    has pins fracture  . Spinal fusion  1996  . Left arm surgery  1975    left elbow fracture  . Redo left arm surgery  2002  . Cervical fusion    . Back surgery      x 4 one with fusion  . Harware removal from back    . Epidural block injection    . Lumbar laminectomy/decompression microdiscectomy Left 07/13/2013     Procedure: Left Lumbar four-five microdiskectomy;  Surgeon: Carmela HurtKyle L Cabbell, MD;  Location: [facility];  Service: Neurosurgery;  Laterality: Left;  Left Lumbar four-five microdiskectomy    Linnell FullingElyse Laterrian Hevener, RD, LDN Pager #: [phone] After-Hours Pager #: [phone]

## 2013-09-16 NOTE — Progress Notes (Addendum)
[facility] PROGRESS NOTE  Rachael Ramirez ZOX:[id number] DOB: 10/24/1970 DOA: 09/14/2013 PCP: Kaleen MaskELKINS,WILSON OLIVER, MD  Assessment/Plan: Active Problems:  Bipolar disorder, unspecified  Chronic low back pain  Drug overdose  History of suicide attempt  Depression  Anxiety  Compression fracture of L1 lumbar vertebra with delayed healing  Lumbar herniated disc  42 y.o. WF PMHx depression, anxiety, bipolar disorder,Hx suicide attempt 2014, chronic back pain, multiple spinal surgeries presented with medication overdose, consumed 28 tablets of trazodone and mother called 911  1. Drug overdose   -clinically stable; mental status at baseline; ED contacted poison control, recommended monitor for at least 6 hrs; ECG NSR: trop negative; -Patient attempted suicide last year and was committed to [facility] per her husband. Per husband patient currently has a drug abuse problem, and believes patient needs an inpatient program i.e. involuntary commitment. He requests that psychiatry contact him after they evaluate  -per psychiatry need inpatient treatment;   2. Suicide attempt/history of suicide; as above  -Patient on suicide watch; need inpatient psychiatry treatment   3. Bipolar; resume Lamictal;   4. Depression started cymbalta   5. Anxiety patient on clonazepam; resume to prevent withdrawal   6. Chronic low back pain, multiple spinal surgeries; last (06/2013 L4,5 disectomy); exam sensation is intact; able to move extremities, but limited to due to pain  -3/21: will obtain MRI L spine due to progressive pain; called in neurosurgery re-evaluation;  -cont pain control; added cymbalta;   7. COPD; CXR: unremarkable; ; cont bronchodilators  8. HTN started amlodipine    Code Status: full Family Communication: d/w patient, called updated Trey PaulaJeff, husband at [phone] (indicate person spoken with, relationship, and if by phone, the number) Disposition Plan: pend clinical improvement     Consultants:  Psychiatry   Procedures:  none  Antibiotics:  None  (indicate start date, and stop date if known)  HPI/Subjective: alert  Objective: Filed Vitals:   09/16/13 0910  BP: 165/82  Pulse: 66  Temp: 98 F (36.7 C)  Resp: 18    Intake/Output Summary (Last 24 hours) at 09/16/13 0957 Last data filed at 09/16/13 [id number]  Gross per 24 hour  Intake     85 ml  Output   2300 ml  Net  -2215 ml   Filed Weights   09/14/13 2100  Weight: 42.7 kg (94 lb 2.2 oz)    Exam:   General:  alert  Cardiovascular: s1,s2 rrr  Respiratory: few wheezing in LL  Abdomen: soft, nt,nd   Musculoskeletal: no LE edema    Data Reviewed: Basic Metabolic Panel:  Recent Labs Lab 09/14/13 1602 09/14/13 2119 09/15/13 0235  NA 142  --  142  K 3.2*  --  3.6*  CL 99  --  104  CO2 28  --  27  GLUCOSE 139*  --  97  BUN 5*  --  <3*  CREATININE 0.53 0.58 0.55  CALCIUM 9.4  --  8.1*  MG 2.3 2.1  --    Liver Function Tests:  Recent Labs Lab 09/14/13 1602 09/15/13 0235  AST 12 9  ALT 5 <5  ALKPHOS 166* 134*  BILITOT 0.4 0.4  PROT 7.0 6.2  ALBUMIN 2.9* 2.4*   No results found for this basename: LIPASE, AMYLASE,  in the last 168 hours No results found for this basename: AMMONIA,  in the last 168 hours CBC:  Recent Labs Lab 09/14/13 1602 09/14/13 2119 09/15/13 0235  WBC 7.8 6.8 7.5  NEUTROABS 5.8  --  5.7  HGB 13.1 11.4* 11.3*  HCT 39.2 33.6* 33.6*  MCV 102.9* 102.1* 102.4*  PLT 366 359 355   Cardiac Enzymes:  Recent Labs Lab 09/14/13 2119 09/15/13 0235 09/15/13 0845  TROPONINI <0.30 <0.30 <0.30   BNP (last 3 results) No results found for this basename: PROBNP,  in the last 8760 hours CBG: No results found for this basename: GLUCAP,  in the last 168 hours  No results found for this or any previous visit (from the past 240 hour(s)).   Studies: Dg Chest Port 1 View  09/15/2013   CLINICAL DATA:  Cough  EXAM: PORTABLE CHEST - 1 VIEW  COMPARISON:   07/11/2013  FINDINGS: Lungs are clear. No pleural effusion or pneumothorax.  The heart is normal in size.  IMPRESSION: No evidence of acute cardiopulmonary disease.   Electronically Signed   By: Charline Bills M.D.   On: 09/15/2013 11:37    Scheduled Meds: . clonazePAM  0.5 mg Oral BID  . DULoxetine  30 mg Oral Daily  . enoxaparin (LOVENOX) injection  40 mg Subcutaneous Q24H  . lamoTRIgine  100 mg Oral Daily  . nicotine  21 mg Transdermal Daily  . pantoprazole  40 mg Oral Daily  . pregabalin  100 mg Oral Daily   Continuous Infusions: . sodium chloride 100 mL/hr at 09/15/13 1830    Principal Problem:   Drug overdose Active Problems:   Bipolar disorder, unspecified   Chronic low back pain   History of suicide attempt   Depression   Anxiety   Compression fracture of L1 lumbar vertebra with delayed healing   Lumbar herniated disc   Abnormal EKG   Suicide attempt by drug ingestion    Time spent: >35 minutes     Esperanza Sheets  [facility] Pager [phone]. If 7PM-7AM, please contact night-coverage at [email or web address], password [facility] 09/16/2013, 9:57 AM  LOS: 2 days

## 2013-09-17 ENCOUNTER — Encounter (HOSPITAL_COMMUNITY): Payer: Self-pay | Admitting: Radiology

## 2013-09-17 ENCOUNTER — Inpatient Hospital Stay (HOSPITAL_COMMUNITY): Payer: Managed Care, Other (non HMO)

## 2013-09-17 DIAGNOSIS — J9819 Other pulmonary collapse: Secondary | ICD-10-CM

## 2013-09-17 DIAGNOSIS — J449 Chronic obstructive pulmonary disease, unspecified: Secondary | ICD-10-CM

## 2013-09-17 DIAGNOSIS — F319 Bipolar disorder, unspecified: Secondary | ICD-10-CM

## 2013-09-17 DIAGNOSIS — T50901A Poisoning by unspecified drugs, medicaments and biological substances, accidental (unintentional), initial encounter: Secondary | ICD-10-CM

## 2013-09-17 DIAGNOSIS — T50902A Poisoning by unspecified drugs, medicaments and biological substances, intentional self-harm, initial encounter: Secondary | ICD-10-CM

## 2013-09-17 LAB — BLOOD GAS, ARTERIAL
Acid-Base Excess: 1.6 mmol/L (ref 0.0–2.0)
Bicarbonate: 25.5 mEq/L — ABNORMAL HIGH (ref 20.0–24.0)
DRAWN BY: 295031
O2 CONTENT: 4 L/min
O2 Saturation: 88.3 %
PATIENT TEMPERATURE: 98.6
PH ART: 7.427 (ref 7.350–7.450)
TCO2: 22.2 mmol/L (ref 0–100)
pCO2 arterial: 39.3 mmHg (ref 35.0–45.0)
pO2, Arterial: 55.8 mmHg — ABNORMAL LOW (ref 80.0–100.0)

## 2013-09-17 LAB — CBC
HCT: 40.9 % (ref 36.0–46.0)
Hemoglobin: 13.5 g/dL (ref 12.0–15.0)
MCH: 32.7 pg (ref 26.0–34.0)
MCHC: 33 g/dL (ref 30.0–36.0)
MCV: 99 fL (ref 78.0–100.0)
PLATELETS: 411 10*3/uL — AB (ref 150–400)
RBC: 4.13 MIL/uL (ref 3.87–5.11)
RDW: 13.4 % (ref 11.5–15.5)
WBC: 6.9 10*3/uL (ref 4.0–10.5)

## 2013-09-17 LAB — SEDIMENTATION RATE
Sed Rate: 24 mm/hr — ABNORMAL HIGH (ref 0–22)
Sed Rate: 8 mm/hr (ref 0–22)

## 2013-09-17 LAB — TROPONIN I: Troponin I: <0.3 ng/mL (ref <0.30)

## 2013-09-17 MED ORDER — FENTANYL CITRATE 0.05 MG/ML IJ SOLN
25.0000 ug | INTRAMUSCULAR | Status: DC | PRN
  Administered 2013-09-17 (×2): 25 ug via INTRAVENOUS
  Administered 2013-09-18 – 2013-09-19 (×8): 50 ug via INTRAVENOUS
  Filled 2013-09-17 (×10): qty 2

## 2013-09-17 MED ORDER — DEXTROSE 5 % IV SOLN
2.0000 g | Freq: Three times a day (TID) | INTRAVENOUS | Status: DC
  Administered 2013-09-17 – 2013-09-23 (×18): 2 g via INTRAVENOUS
  Filled 2013-09-17 (×23): qty 2

## 2013-09-17 MED ORDER — VANCOMYCIN HCL IN DEXTROSE 750-5 MG/150ML-% IV SOLN
750.0000 mg | INTRAVENOUS | Status: AC
  Administered 2013-09-17: 750 mg via INTRAVENOUS
  Filled 2013-09-17: qty 150

## 2013-09-17 MED ORDER — LORAZEPAM 2 MG/ML IJ SOLN
0.5000 mg | INTRAMUSCULAR | Status: DC | PRN
  Administered 2013-09-17 (×2): 0.5 mg via INTRAVENOUS
  Administered 2013-09-18 (×5): 1 mg via INTRAVENOUS
  Filled 2013-09-17 (×8): qty 1

## 2013-09-17 MED ORDER — ALUM & MAG HYDROXIDE-SIMETH 200-200-20 MG/5ML PO SUSP
30.0000 mL | Freq: Once | ORAL | Status: DC
  Filled 2013-09-17: qty 30

## 2013-09-17 MED ORDER — VANCOMYCIN HCL 500 MG IV SOLR
500.0000 mg | Freq: Three times a day (TID) | INTRAVENOUS | Status: DC
  Administered 2013-09-18 – 2013-09-19 (×4): 500 mg via INTRAVENOUS
  Filled 2013-09-17 (×8): qty 500

## 2013-09-17 MED ORDER — ALBUTEROL SULFATE (2.5 MG/3ML) 0.083% IN NEBU
2.5000 mg | INHALATION_SOLUTION | RESPIRATORY_TRACT | Status: DC | PRN
  Filled 2013-09-17: qty 3

## 2013-09-17 MED ORDER — GADOBENATE DIMEGLUMINE 529 MG/ML IV SOLN
10.0000 mL | Freq: Once | INTRAVENOUS | Status: AC | PRN
  Administered 2013-09-17: 8 mL via INTRAVENOUS

## 2013-09-17 MED ORDER — IOHEXOL 350 MG/ML SOLN
100.0000 mL | Freq: Once | INTRAVENOUS | Status: AC | PRN
  Administered 2013-09-17: 100 mL via INTRAVENOUS

## 2013-09-17 MED ORDER — VANCOMYCIN HCL 500 MG IV SOLR
500.0000 mg | INTRAVENOUS | Status: DC
Start: 2013-09-17 — End: 2013-09-17
  Filled 2013-09-17: qty 500

## 2013-09-17 NOTE — Progress Notes (Signed)
ANTIBIOTIC CONSULT NOTE - INITIAL  Pharmacy Consult for Vancomycin/Ceftazidime Indication: epidural abscess  Allergies  Allergen Reactions  . Minocycline Nausea And Vomiting    Patient Measurements: Height: 5\' 3"  (160 cm) Weight: 94 lb 2.2 oz (42.7 kg) IBW/kg (Calculated) : 52.4  Vital Signs: Temp: 98.7 F (37.1 C) (03/23 1812) Temp src: Oral (03/23 1812) BP: 130/90 mmHg (03/23 1812) Pulse Rate: 114 (03/23 1812) Intake/Output from previous day: 03/22 0701 - 03/23 0700 In: 85 [P.O.:85] Out: 1475 [Urine:1475] Intake/Output from this shift:    Labs:  Recent Labs  09/14/13 2119 09/15/13 0235 09/17/13 1145  WBC 6.8 7.5 6.9  HGB 11.4* 11.3* 13.5  PLT 359 355 411*  CREATININE 0.58 0.55  --    Estimated Creatinine Clearance: 60.5 ml/min (by C-G formula based on Cr of 0.55). No results found for this basename: VANCOTROUGH, VANCOPEAK, VANCORANDOM, GENTTROUGH, GENTPEAK, GENTRANDOM, TOBRATROUGH, TOBRAPEAK, TOBRARND, AMIKACINPEAK, AMIKACINTROU, AMIKACIN,  in the last 72 hours   Microbiology: No results found for this or any previous visit (from the past 720 hour(s)).  Medical History: Past Medical History  Diagnosis Date  . Back pain   . Asthma     uses Combivent daily as needed  . Insomnia     takes Trazodone nightly  . Anxiety     takes Klonopin daily as needed  . PONV (postoperative nausea and vomiting)   . History of bronchitis   . Cough   . Spinal headache   . Weakness     left leg  . Chronic back pain     HNP and Radiculopathy  . GERD (gastroesophageal reflux disease)     takes Omeprazole daily  . Urinary urgency   . Bipolar 1 disorder     takes Lamictal daily  . Chronic low back pain 08/22/2013  . MRSA (methicillin resistant Staphylococcus aureus)     Postive nasal swab     Assessment: 43 y/o female with a long history of chronic low back pain s/p uncomplicated lumbar discectomy, bipolar disorder, narcotic abuse and suicide attempts was admitted on  3/20 with an intentional trazodone overdose.  3/23 MRI lumbar spine showed likely small epidural abscess L4-5; increased edema of L4-5 verteba worrisome for infection.  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  Transferred to ICU when patient developed left lower lung collapse.  [facility] is [facility] evaluation once respiratory status improved.  Afebrile  WBC WNL  SCr 0.55 (3/21), CrCl~85 ml/min (CG)  Blood cultures sent.  Goal of Therapy:  Vancomycin trough level 15-20 mcg/ml  Plan: 1.  Vancomycin 750 mg IV x 1 loading dose then 500 mg IV q8h. 2.  Ceftazidime 2g IV q8h. 3.  F/u vancomycin trough at steady state, results of blood cultures, clinical course.  Clance BollRunyon, Sanjana Folz 09/17/2013,8:22 PM

## 2013-09-17 NOTE — Progress Notes (Signed)
PT Cancellation Note  Patient Details Name: Rachael Ramirez MRN: [id number] DOB: 02/22/1971   Cancelled Treatment:    Reason Eval/Treat Not Completed: Medical issues which prohibited therapy (MRI results noted. will need MD approval to mobilize. )   Rada HayHill, Dorothyann Mourer Elizabeth 09/17/2013, 9:10 [phone]

## 2013-09-17 NOTE — Progress Notes (Signed)
IR aware of request for poss aspiration of epidural abscess. Pt is post laminectomy 1/15. Admitted with drug overdose but also c/o severe back pain Afebrile and Nl WBC MRI done last pm shows small epidural collection in the left lateral recess.  Will have Dr. Corliss Skainseveshwar review MRI to see if this is amenable or approachable for percutaneous aspiration. Also recommend neurosurgery input to confirm/approve any procedures for their post-op patient.  Brayton ElKevin Spiros Greenfeld PA-C Interventional Radiology 09/17/2013 10:08 AM

## 2013-09-17 NOTE — Consult Note (Addendum)
PULMONARY / CRITICAL CARE MEDICINE   Name: Rachael Ramirez MRN: [id number] DOB: 03-09-1971    ADMISSION DATE:  09/14/2013 CONSULTATION DATE:  09/14/2013  REFERRING MD :  Dhungel PRIMARY SERVICE: TRH  CHIEF COMPLAINT:  Back pain  BRIEF PATIENT DESCRIPTION: 43 y/o female with a long history of chronic low back pain, bipolar disorder, narcotic abuse and suicide attempts was admitted on 3/20 with an intentional trazodone overdose.  PCCM consulted on 3/23 when the patient developed left lower lobe collapse likely due to a mucus plug.  SIGNIFICANT EVENTS / STUDIES:  3/23 MRI lumbar spine> likely small epidural abscess L4-5; increased edema of L4-5 verteba worrisome for infection 3/23 CT angio chest > no PE, LLL collapse  LINES / TUBES:   CULTURES: 3/23 blood >>  ANTIBIOTICS: 3/23 vanc >> 3/23 ceftaz >>  HISTORY OF PRESENT ILLNESS:  43 y/o female with a long history of chronic low back pain, bipolar disorder, narcotic abuse and suicide attempts was admitted on 3/20 with an intentional trazodone overdose.  PCCM consulted on 3/23 when the patient developed left lower lobe collapse likely due to a mucus plug. She notes that she felt some chest discomfort and then shortness of breath.  No prior nausea, vomiting, or choking on foods. She has baseline asthma and smokes cigarettes. She has a weak cough.  PAST MEDICAL HISTORY :  Past Medical History  Diagnosis Date  . Back pain   . Asthma     uses Combivent daily as needed  . Insomnia     takes Trazodone nightly  . Anxiety     takes Klonopin daily as needed  . PONV (postoperative nausea and vomiting)   . History of bronchitis   . Cough   . Spinal headache   . Weakness     left leg  . Chronic back pain     HNP and Radiculopathy  . GERD (gastroesophageal reflux disease)     takes Omeprazole daily  . Urinary urgency   . Bipolar 1 disorder     takes Lamictal daily  . Chronic low back pain 08/22/2013  . MRSA (methicillin resistant  Staphylococcus aureus)     Postive nasal swab   Past Surgical History  Procedure Laterality Date  . Hand surgery Right 1992    has pins fracture  . Spinal fusion  1996  . Left arm surgery  1975    left elbow fracture  . Redo left arm surgery  2002  . Cervical fusion    . Back surgery      x 4 one with fusion  . Harware removal from back    . Epidural block injection    . Lumbar laminectomy/decompression microdiscectomy Left 07/13/2013    Procedure: Left Lumbar four-five microdiskectomy;  Surgeon: Winfield Cunas, MD;  Location: [facility];  Service: Neurosurgery;  Laterality: Left;  Left Lumbar four-five microdiskectomy   Prior to Admission medications   Medication Sig Start Date End Date Taking? Authorizing Provider  albuterol-ipratropium (COMBIVENT) 18-103 MCG/ACT inhaler Inhale 2 puffs into the lungs every 6 (six) hours as needed for wheezing or shortness of breath. For wheezing   Yes Historical Provider, MD  clonazePAM (KLONOPIN) 1 MG tablet Take 1 mg by mouth 2 (two) times daily. For anxiety   Yes Historical Provider, MD  HYDROcodone-acetaminophen (NORCO) 10-325 MG per tablet Take 1-2 tablets by mouth every 4 (four) hours as needed for moderate pain.   Yes Historical Provider, MD  lamoTRIgine (LAMICTAL) 100 MG  tablet Take 100 mg by mouth at bedtime.    Yes Historical Provider, MD  minocycline (MINOCIN,DYNACIN) 100 MG capsule Take 1 capsule by mouth 2 (two) times daily. 08/30/13  Yes Historical Provider, MD  omeprazole (PRILOSEC) 20 MG capsule Take 20 mg by mouth daily as needed (for heartburn).    Yes Historical Provider, MD  oxyCODONE-acetaminophen (PERCOCET/ROXICET) 5-325 MG per tablet Take 1-2 tablets by mouth every 4 (four) hours as needed for severe pain. 09/05/13  Yes Shari A Upstill, PA-C  pregabalin (LYRICA) 300 MG capsule Take 300 mg by mouth at bedtime.    Yes Historical Provider, MD  traZODone (DESYREL) 150 MG tablet Take 150 mg by mouth at bedtime.   Yes Historical  Provider, MD   Allergies  Allergen Reactions  . Minocycline Nausea And Vomiting    FAMILY HISTORY:  Family History  Problem Relation Age of Onset  . Multiple sclerosis Mother   . COPD Father   . Alcohol abuse Maternal Grandfather   . Alcohol abuse Brother   . Cirrhosis Brother    SOCIAL HISTORY:  reports that she has been smoking.  She has never used smokeless tobacco. She reports that she does not drink alcohol or use illicit drugs.  REVIEW OF SYSTEMS:   Gen: Denies fever, chills, weight change, + fatigue, night sweats HEENT: Denies blurred vision, double vision, hearing loss, tinnitus, sinus congestion, rhinorrhea, sore throat, neck stiffness, dysphagia PULM: per HPI CV: Denies chest pain, edema, orthopnea, paroxysmal nocturnal dyspnea, palpitations GI: Denies abdominal pain, nausea, vomiting, diarrhea, hematochezia, melena, constipation, change in bowel habits GU: Denies dysuria, hematuria, polyuria, oliguria, urethral discharge Endocrine: Denies hot or cold intolerance, polyuria, polyphagia or appetite change Derm: Denies rash, dry skin, scaling or peeling skin change Heme: Denies easy bruising, bleeding, bleeding gums Neuro: Denies headache, numbness, weakness, slurred speech, loss of memory or consciousness   SUBJECTIVE:   VITAL SIGNS: Temp:  [97.8 F (36.6 C)-98.7 F (37.1 C)] 98.7 F (37.1 C) (03/23 1812) Pulse Rate:  [81-114] 114 (03/23 1812) Resp:  [16-21] 21 (03/23 1812) BP: (114-144)/(79-99) 130/90 mmHg (03/23 1812) SpO2:  [92 %-98 %] 98 % (03/23 1812) HEMODYNAMICS:   VENTILATOR SETTINGS:   INTAKE / OUTPUT: Intake/Output     03/23 0701 - 03/24 0700   P.O. 0   Total Intake(mL/kg)    Urine (mL/kg/hr) 350 (0.6)   Total Output 350   Net -350         PHYSICAL EXAMINATION: General: resting comfortably in bed HEENT: NCAT, PERRL, EOMi PULM: diminished left base, minimal wheeze on right CV: Tachy, regular, no mgr AB: BS+, soft, nontender Ext: warm no  edema Neuro: Awake and alert, oriented, conversant, maew  LABS:  CBC  Recent Labs Lab 09/14/13 2119 09/15/13 0235 09/17/13 1145  WBC 6.8 7.5 6.9  HGB 11.4* 11.3* 13.5  HCT 33.6* 33.6* 40.9  PLT 359 355 411*   Coag's No results found for this basename: APTT, INR,  in the last 168 hours BMET  Recent Labs Lab 09/14/13 1602 09/14/13 2119 09/15/13 0235  NA 142  --  142  K 3.2*  --  3.6*  CL 99  --  104  CO2 28  --  27  BUN 5*  --  <3*  CREATININE 0.53 0.58 0.55  GLUCOSE 139*  --  97   Electrolytes  Recent Labs Lab 09/14/13 1602 09/14/13 2119 09/15/13 0235  CALCIUM 9.4  --  8.1*  MG 2.3 2.1  --  Sepsis Markers No results found for this basename: LATICACIDVEN, PROCALCITON, O2SATVEN,  in the last 168 hours ABG  Recent Labs Lab 09/17/13 1545  PHART 7.427  PCO2ART 39.3  PO2ART 55.8*   Liver Enzymes  Recent Labs Lab 09/14/13 1602 09/15/13 0235  AST 12 9  ALT 5 <5  ALKPHOS 166* 134*  BILITOT 0.4 0.4  ALBUMIN 2.9* 2.4*   Cardiac Enzymes  Recent Labs Lab 09/15/13 0235 09/15/13 0845 09/17/13 1545  TROPONINI <0.30 <0.30 <0.30   Glucose No results found for this basename: GLUCAP,  in the last 168 hours  Imaging  3/23 CXR: left lower lobe collapse  ASSESSMENT / PLAN:  PULMONARY A: Acute left lower lobe collapse most likely due to mucus plugging from weak cough from splinting, no clear aspiration; currently causing hypoxemia Currently protecting airway, not in respiratory distress Asthma with out exacerbation P:   -treat with aggressive pulmonary toilette -incentive spirometry -chest PT -BIPAP for positive pressure to open up lung -repeat CXR in AM -if no improvement, intubate for bronchoscopy  CARDIOVASCULAR A: Sinus tach, likely volume depletion P:  -gentle IVF   INFECTIOUS A:  Small epidural abscess L4-5 spine and likely vertebral body osteomyelitis P:   -ESR, blood culture -to [facility] for NSGY evaluation when respiratory  status improved -start vanc/ceftaz  NEUROLOGIC A:  Suicide attempt by drug overdose> will need inpatient psychiatry eval Bipolar disorder Narcotic dependent P:   -low dose IV fentanyl -prn ativan -will need psyche transfer after medical issues addressed  TODAY'S SUMMARY: Currently hypoxemic but not in acute distress, protecting airway, will likely benefit from positive pressure and pulm toilette, if no improvement will intubate and perform bronchoscopy  I have personally obtained a history, examined the patient, evaluated laboratory and imaging results, formulated the assessment and plan and placed orders. CRITICAL CARE: The patient is critically ill with multiple organ systems failure and requires high complexity decision making for assessment and support, frequent evaluation and titration of therapies, application of advanced monitoring technologies and extensive interpretation of multiple databases. Critical Care Time devoted to patient care services described in this note is 45  minutes.    Jillyn Hidden PCCM Pager: [phone] Cell: [phone] If no response, call [phone]   09/17/2013, 7:54 PM

## 2013-09-17 NOTE — Progress Notes (Signed)
[facility] as patient became acutely hypoxic to 80s and even on 6L via [place] improved only to 88%. Patient was tachycardic as well and c/o chest discomfort. EKG showed sinus tachycardia. Troponin negative. CXR shows collapse of left lung with possible mucus plug.  ABG showed normal PH and PCO2 with ow of 56 on 6L. CT angio chest done to r/o PE and was negative but again showed complete left lower lobe collapase with  Mucus plugging.  patient now on NRB. transferred to stepdown  d/w pulmonary ( Dr Delford FieldWright ) who will evaluate pt in ICU

## 2013-09-17 NOTE — Progress Notes (Signed)
Report called to Morrie SheldonAshley RN at [facility].

## 2013-09-17 NOTE — Progress Notes (Signed)
[facility] PROGRESS NOTE  Rachael Ramirez ERX:[id number] DOB: 1971-06-09 DOA: 09/14/2013 PCP: Leonard Downing, MD   Brief narrative Please refer to admission H&P from 3/20 in details but in brief, 43 y.o. Female with PMHx depression, anxiety, bipolar disorder,Hx suicide attempt 2014, chronic back pain, multiple spinal surgeries presented with medication overdose, consumed 28 tablets of trazodone and mother called 911. 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 Winfield Cunas (neurosurgery); Patient was discharged on clonazepam, cyclobenzaprine, Norco 10-325, Lamictal 100 mg, Lyrica 300 mg, trazodone 100 mg. Patient monitored on telemetry. Serial EKG and troponins negative. Seen by psych consult and recommended inpatient psych once medically cleared. Patient reported persistent low back pain and an MRI of the lumbar spine with and without contrast was obtained which showed increased epidural fluid collection in the left lateral recess along the left side of the thecal sac at L4-L5 worrisome for a small epidural abscess. Also concern for osteomyelitis at that level.   Assessment/Plan:  Drug overdose with trazodone -clinically stable. mental status at baseline.  ED contacted poison control, recommended monitor for at least 6 hrs; ECG NSR.  trop negative. Table on telemetry. -Patient attempted suicide last year and was committed to [facility] per her husband. Per husband patient currently has a drug abuse problem, and believes patient needs an inpatient program i.e. involuntary commitment.  -Seen by psychiatry consult and recommend need  for inpatient treatment once medically cleared  2. Suicide attempt/history of suicide;  as above  -Patient on suicide watch; need inpatient psychiatry treatment   3. Bipolar: resume Lamictal  4. Depression  started cymbalta , as per  psychiatric condition  5. Anxiety  patient on clonazepam  6. Chronic  low back pain multiple spinal surgeries; last (06/2013 L4- 5 disectomy); limited lower extremity movement because of pain but no gross weakness, bowel or urinary symptoms.. Sensation intact. MRI of the lower -3/21: will obtain MRI L spine due to progressive pain was done which showed increased epidural fluid collection in the left lateral recess along the left side of the thecal sac at L4-L5 worrisome for a small epidural abscess. Also concern for osteomyelitis at that level. Spoke with her surgeon Dr. Christella Noa who informed that patient has been noncompliant with followup and blood works and recommended obtaining an ESR. Also recommended counseling her to [facility] for evaluation if patient agrees. On discussing with the patient she initially hesitated but agreed to be transferred to [facility] for neurosurgical evaluation. -cont pain control; added cymbalta. -hold off on starting antibiotics if  ? epidural abscess drained and sent for culture. Please discuss with ID pending neurosurgery eval.   7. COPD  CXR: unremarkable, cont bronchodilators   8. HTN  started amlodipine , titrate dose as needed.  Code Status: full  Family Communication: d/w patient, called and updated Merry Proud, husband at [phone] Disposition Plan: Transfer to [facility]     Code Status: Full code Family Communication: None at bedside Disposition Plan: Transfer to [facility]. informed accepting hospitalist Dr Tat over the phone   Consultants:  Neurosurgery (Dr. Cyndy Freeze [facility])  PSYCHIATRY    Procedures:  none  Antibiotics:  none  HPI/Subjective: Patient seen and examined this morning. Denies being suicidal however complains of severe pain over her low back.  Objective: Filed Vitals:   09/17/13 1022  BP: 144/99  Pulse:   Temp:   Resp:     Intake/Output  Summary (Last 24 hours) at 09/17/13 1223 Last data filed at 09/17/13 1100  Gross per 24 hour  Intake     60 ml  Output   1425 ml   Net  -1365 ml   Filed Weights   09/14/13 2100  Weight: 42.7 kg (94 lb 2.2 oz)    Exam:   General: Middle-aged thin built female in no acute distress  HEENT: No pallor, moist oral mucosa  Chest: Clear to auscultation bilaterally, no added sounds  CVS: Normal S1 and S2, no murmurs rub or gallop  Abdomen: Soft, nontender, nondistended, bowel sounds present  Extremities: Tenderness over her lower back and the lumbar surgical site and lumbar paraspinal area. Normal sensations, strength over lower legs limited due to pain.   CNS: AAO x3  Data Reviewed: Basic Metabolic Panel:  Recent Labs Lab 09/14/13 1602 09/14/13 2119 09/15/13 0235  NA 142  --  142  K 3.2*  --  3.6*  CL 99  --  104  CO2 28  --  27  GLUCOSE 139*  --  97  BUN 5*  --  <3*  CREATININE 0.53 0.58 0.55  CALCIUM 9.4  --  8.1*  MG 2.3 2.1  --    Liver Function Tests:  Recent Labs Lab 09/14/13 1602 09/15/13 0235  AST 12 9  ALT 5 <5  ALKPHOS 166* 134*  BILITOT 0.4 0.4  PROT 7.0 6.2  ALBUMIN 2.9* 2.4*   No results found for this basename: LIPASE, AMYLASE,  in the last 168 hours No results found for this basename: AMMONIA,  in the last 168 hours CBC:  Recent Labs Lab 09/14/13 1602 09/14/13 2119 09/15/13 0235 09/17/13 1145  WBC 7.8 6.8 7.5 6.9  NEUTROABS 5.8  --  5.7  --   HGB 13.1 11.4* 11.3* 13.5  HCT 39.2 33.6* 33.6* 40.9  MCV 102.9* 102.1* 102.4* 99.0  PLT 366 359 355 411*   Cardiac Enzymes:  Recent Labs Lab 09/14/13 2119 09/15/13 0235 09/15/13 0845  TROPONINI <0.30 <0.30 <0.30   BNP (last 3 results) No results found for this basename: PROBNP,  in the last 8760 hours CBG: No results found for this basename: GLUCAP,  in the last 168 hours  No results found for this or any previous visit (from the past 240 hour(s)).   Studies: Mr Lumbar Spine W Wo Contrast  09/17/2013   CLINICAL DATA:  Severe low back pain.  EXAM: MRI LUMBAR SPINE WITHOUT AND WITH CONTRAST  TECHNIQUE:  Multiplanar and multiecho pulse sequences of the lumbar spine were obtained without and with intravenous contrast.  CONTRAST:  68m MULTIHANCE GADOBENATE DIMEGLUMINE 529 MG/ML IV SOLN  COMPARISON:  08/23/2013  FINDINGS: There has been interval increase in the loculated epidural fluid collection in the left lateral recess at L4-5 now extending more posteriorly and compressing the left side of the thecal sac. The small fluid collection in left lateral recess with peripheral enhancement persists but is slightly smaller but now fluid from this tracks posteriorly along the left side of the thecal sac and into the laminectomy defect.  There is increased edema and enhancement of the L4 and L5 vertebra. There is no abnormal enhancement of the disc space and a tiny amount of fluid that was in the disc space on the prior study has resolved. The disc space is not narrowed.  The small midline subcutaneous fluid collection at the operative level has almost resolved.  The remainder of the lumbar spine is unchanged without  significant abnormalities.  IMPRESSION: 1. Increased epidural fluid collection in the left lateral recess and along the left side of the thecal sac at L4-5, worrisome for a small epidural abscess. 2. Increased edema and enhancement in the L4 and L5 vertebra which may be discogenic in origin but given the adjacent possible infection, I am concerned that this could represent osteomyelitis. Critical Value/emergent results were called by telephone at the time of interpretation on 09/17/2013 at 8:31 AM to the attending physician, who verbally acknowledged these results.   Electronically Signed   By: Rozetta Nunnery M.D.   On: 09/17/2013 08:30    Scheduled Meds: . alum & mag hydroxide-simeth  30 mL Oral Once  . amLODipine  5 mg Oral Daily  . clonazePAM  0.5 mg Oral BID  . DULoxetine  30 mg Oral Daily  . enoxaparin (LOVENOX) injection  40 mg Subcutaneous Q24H  . feeding supplement (ENSURE COMPLETE)  237 mL Oral TID  BM  . lamoTRIgine  100 mg Oral Daily  . nicotine  21 mg Transdermal Daily  . pantoprazole  40 mg Oral Daily  . pregabalin  200 mg Oral Daily  . senna-docusate  1 tablet Oral BID   Continuous Infusions:      Time spent: 25 minutes    Lathaniel Legate  [facility] Pager [phone] If 7PM-7AM, please contact night-coverage at [email or web address], password [facility] 09/17/2013, 12:23 PM  LOS: 3 days

## 2013-09-17 NOTE — Consult Note (Signed)
Reason for Consult:epidural abcess Referring Physician: dhungel  Rachael Ramirez is an 43 y.o. female.  HPI: whom underwent an uncomplicated lumbar discetomy. She has been complaining of back pain before the operating and since the operation. She missed a followup appointment, then was seen after going to the emergency room. An mri showed no disc recurrence and a questionable fluid collection at the operative site. I asked her to obtain blood tests to see if she had an infection, but she refused. She has not been compliant, and has been seeking narcotics. She is now admitted for a suicide attempt. A new mri has been done showing a fluid collection lateral to the thecal sac. It is still not clear that this represents an infection. She was scheduled for transfer today but had respiratory distress at the time of transfer and was sent to the ICU. She was supposed to have been put on abx but that was not done.   Past Medical History  Diagnosis Date  . Back pain   . Asthma     uses Combivent daily as needed  . Insomnia     takes Trazodone nightly  . Anxiety     takes Klonopin daily as needed  . PONV (postoperative nausea and vomiting)   . History of bronchitis   . Cough   . Spinal headache   . Weakness     left leg  . Chronic back pain     HNP and Radiculopathy  . GERD (gastroesophageal reflux disease)     takes Omeprazole daily  . Urinary urgency   . Bipolar 1 disorder     takes Lamictal daily  . Chronic low back pain 08/22/2013  . MRSA (methicillin resistant Staphylococcus aureus)     Postive nasal swab    Past Surgical History  Procedure Laterality Date  . Hand surgery Right 1992    has pins fracture  . Spinal fusion  1996  . Left arm surgery  1975    left elbow fracture  . Redo left arm surgery  2002  . Cervical fusion    . Back surgery      x 4 one with fusion  . Harware removal from back    . Epidural block injection    . Lumbar laminectomy/decompression microdiscectomy  Left 07/13/2013    Procedure: Left Lumbar four-five microdiskectomy;  Surgeon: Carmela Hurt, MD;  Location: [facility];  Service: Neurosurgery;  Laterality: Left;  Left Lumbar four-five microdiskectomy    Family History  Problem Relation Age of Onset  . Multiple sclerosis Mother   . COPD Father   . Alcohol abuse Maternal Grandfather   . Alcohol abuse Brother   . Cirrhosis Brother     Social History:  reports that she has been smoking.  She has never used smokeless tobacco. She reports that she does not drink alcohol or use illicit drugs.  Allergies:  Allergies  Allergen Reactions  . Minocycline Nausea And Vomiting    Medications: I have reviewed the patient's current medications.  Results for orders placed during the hospital encounter of 09/14/13 (from the past 48 hour(s))  SEDIMENTATION RATE     Status: Abnormal   Collection Time    09/17/13 11:45 AM      Result Value Ref Range   Sed Rate 24 (*) 0 - 22 mm/hr  CBC     Status: Abnormal   Collection Time    09/17/13 11:45 AM      Result Value  Ref Range   WBC 6.9  4.0 - 10.5 K/uL   RBC 4.13  3.87 - 5.11 MIL/uL   Hemoglobin 13.5  12.0 - 15.0 g/dL   HCT 16.140.9  09.636.0 - 04.546.0 %   MCV 99.0  78.0 - 100.0 fL   MCH 32.7  26.0 - 34.0 pg   MCHC 33.0  30.0 - 36.0 g/dL   RDW 40.913.4  81.111.5 - 91.415.5 %   Platelets 411 (*) 150 - 400 K/uL  BLOOD GAS, ARTERIAL     Status: Abnormal   Collection Time    09/17/13  3:45 PM      Result Value Ref Range   O2 Content 4.0     Delivery systems NASAL CANNULA     pH, Arterial 7.427  7.350 - 7.450   pCO2 arterial 39.3  35.0 - 45.0 mmHg   pO2, Arterial 55.8 (*) 80.0 - 100.0 mmHg   Bicarbonate 25.5 (*) 20.0 - 24.0 mEq/L   TCO2 22.2  0 - 100 mmol/L   Acid-Base Excess 1.6  0.0 - 2.0 mmol/L   O2 Saturation 88.3     Patient temperature 98.6     Collection site RIGHT RADIAL     Drawn by [phone]     Sample type ARTERIAL DRAW     Allens test (pass/fail) PASS  PASS  TROPONIN I     Status: None    Collection Time    09/17/13  3:45 PM      Result Value Ref Range   Troponin I <0.30  <0.30 ng/mL   Comment:            Due to the release kinetics of cTnI,     a negative result within the first hours     of the onset of symptoms does not rule out     myocardial infarction with certainty.     If myocardial infarction is still suspected,     repeat the test at appropriate intervals.    Ct Angio Chest Pe W/cm &/or Wo Cm  09/17/2013   CLINICAL DATA:  Acute shortness of breath and chest pain. Rule out pulmonary embolus.  EXAM: CT ANGIOGRAPHY CHEST WITH CONTRAST  TECHNIQUE: Multidetector CT imaging of the chest was performed using the standard protocol during bolus administration of intravenous contrast. Multiplanar CT image reconstructions and MIPs were obtained to evaluate the vascular anatomy.  CONTRAST:  100mL OMNIPAQUE IOHEXOL 350 MG/ML SOLN  COMPARISON:  Chest radiograph 09/17/2013  FINDINGS: There is good pulmonary arterial tree opacification. No filling defects are seen to indicate pulmonary emboli. The heart is normal in size and shifted leftward in the thorax. 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 No enlarged mediastinal or hilar lymph nodes are identified. There is no pleural or pericardial effusion.  There is low-density material within the left lower lobe bronchus and within multiple left lower lobe segmental and subsegmental bronchi. There is complete collapse of the left lower lobe. Minimal subpleural opacity in the left upper lobe and lingula may reflect atelectasis. There is a small focus of ground-glass opacity medially in the right upper lobe, which may reflect atelectasis or inflammation/ infection.  The visualized portion the upper abdomen is unremarkable. There is slight posterior wedging of the T5 vertebral body, which appears chronic.  Review of the MIP images confirms the above findings.  IMPRESSION: 1. No evidence of pulmonary emboli. 2. Low-density  material within left lower lobe bronchi with complete left lower lobe collapse.  Findings may reflect mucus plugging. Radiographic follow-up is recommended to ensure resolution.   Electronically Signed   By: Sebastian Ache   On: 09/17/2013 17:59   Mr Lumbar Spine W Wo Contrast  09/17/2013   CLINICAL DATA:  Severe low back pain.  EXAM: MRI LUMBAR SPINE WITHOUT AND WITH CONTRAST  TECHNIQUE: Multiplanar and multiecho pulse sequences of the lumbar spine were obtained without and with intravenous contrast.  CONTRAST:  8mL MULTIHANCE GADOBENATE DIMEGLUMINE 529 MG/ML IV SOLN  COMPARISON:  08/23/2013  FINDINGS: There has been interval increase in the loculated epidural fluid collection in the left lateral recess at L4-5 now extending more posteriorly and compressing the left side of the thecal sac. The small fluid collection in left lateral recess with peripheral enhancement persists but is slightly smaller but now fluid from this tracks posteriorly along the left side of the thecal sac and into the laminectomy defect.  There is increased edema and enhancement of the L4 and L5 vertebra. There is no abnormal enhancement of the disc space and a tiny amount of fluid that was in the disc space on the prior study has resolved. The disc space is not narrowed.  The small midline subcutaneous fluid collection at the operative level has almost resolved.  The remainder of the lumbar spine is unchanged without significant abnormalities.  IMPRESSION: 1. Increased epidural fluid collection in the left lateral recess and along the left side of the thecal sac at L4-5, worrisome for a small epidural abscess. 2. Increased edema and enhancement in the L4 and L5 vertebra which may be discogenic in origin but given the adjacent possible infection, I am concerned that this could represent osteomyelitis. Critical Value/emergent results were called by telephone at the time of interpretation on 09/17/2013 at 8:31 AM to the attending physician, who  verbally acknowledged these results.   Electronically Signed   By: Geanie Cooley M.D.   On: 09/17/2013 08:30   Dg Chest Port 1 View  09/17/2013   CLINICAL DATA:  Shortness breath.  EXAM: PORTABLE CHEST - 1 VIEW  COMPARISON:  09/15/2013.  FINDINGS: Interval development of collapse of the left lower lobe with mediastinal shift to left and elevation left hemidiaphragm. This may represent the presence of a mucous plugs. Follow-up until clearance recommended.  IMPRESSION: Interval development of collapse of the left lower lobe with mediastinal shift to left and elevation left hemidiaphragm. This may represent the presence of a mucous plugs. Follow-up until clearance recommended.  This is a call report.  Casimiro Needle radiology technologist)   Electronically Signed   By: Bridgett Larsson M.D.   On: 09/17/2013 16:56    Review of Systems  Constitutional: Positive for malaise/fatigue.  Musculoskeletal: Positive for back pain.  Neurological: Positive for weakness. Negative for sensory change and speech change.  Psychiatric/Behavioral: Positive for depression, suicidal ideas and substance abuse. The patient is nervous/anxious.    Blood pressure 130/90, pulse 114, temperature 98.7 F (37.1 C), temperature source Oral, resp. rate 21, height 5\' 3"  (1.6 m), weight 42.7 kg (94 lb 2.2 oz), last menstrual period 09/02/2013, SpO2 98.00%. Physical Exam  Constitutional: She is oriented to person, place, and time. She appears cachectic. She appears toxic. She has a sickly appearance. She appears ill. She appears distressed.  Neurological: She is alert and oriented to person, place, and time. She displays no tremor. No cranial nerve deficit or sensory deficit. She displays no seizure activity. Coordination normal.  Weak in all extremities Gait not assessed  Assessment/Plan: Will await medical stability and then explore wound as there is no other way to assess this fluid. A radiographic approach is not reasonable, nor  advisable. Antibiotics will be started, I appreciate CCM assistance.   Leul Narramore L 09/17/2013, 8:12 PM

## 2013-09-17 NOTE — Progress Notes (Signed)
Clinical Social Work Department CLINICAL SOCIAL WORK PSYCHIATRY SERVICE LINE ASSESSMENT 09/17/2013  Patient:  Rachael Ramirez [place]  Account:  [id number]  [place] Date:  09/14/2013  Clinical Social Worker:  Sindy Messing, LCSW  Date/Time:  09/17/2013 11:30 AM Referred by:  Physician  Date referred:  09/17/2013 Reason for Referral  Psychosocial assessment   Presenting Symptoms/Problems (In the person's/family's own words):   Psych consulted due to overdose.   Abuse/Neglect/Trauma History (check all that apply)  Denies history   Abuse/Neglect/Trauma Comments:   Psychiatric History (check all that apply)  Outpatient treatment  Inpatient/hospitilization   Psychiatric medications:  Klonopin 0.5 mg  Cymbalta 30 mg  Lamictal 100 mg   Current Mental Health Hospitalizations/Previous Mental Health History:   Patient reports she was diagnosed with bipolar disorder. Patient currently receives medication management but denies any therapy. Patient has also received inpatient treatment after suicide attempt.   Current provider:   Dr. Letta Moynahan   Place and Date:   [place], [place]   Current Medications:   Scheduled Meds:      . alum & mag hydroxide-simeth  30 mL Oral Once  . amLODipine  5 mg Oral Daily  . clonazePAM  0.5 mg Oral BID  . DULoxetine  30 mg Oral Daily  . enoxaparin (LOVENOX) injection  40 mg Subcutaneous Q24H  . feeding supplement (ENSURE COMPLETE)  237 mL Oral TID BM  . lamoTRIgine  100 mg Oral Daily  . nicotine  21 mg Transdermal Daily  . pantoprazole  40 mg Oral Daily  . pregabalin  200 mg Oral Daily  . senna-docusate  1 tablet Oral BID        Continuous Infusions:      PRN Meds:.acetaminophen, ipratropium-albuterol, ketorolac, morphine injection, ondansetron (ZOFRAN) IV, oxyCODONE-acetaminophen       Previous Impatient Admission/Date/Reason:   Patient had stay at [facility] in [place], [place] about 8 months ago for 1 week.   Emotional Health / Current Symptoms     Suicide/Self Harm  Suicide attempt in past (date/description)   Suicide attempt in the past:   Patient reports that this is the second time she has attempted to harm herself due to unmanageable pain. Patient reports that she cut her wrists about 8 months ago and then overdosed on pain medications prior to this admission. Patient reports current SI if she is unable to manage her pain.   Other harmful behavior:   None reported   Psychotic/Dissociative Symptoms  None reported   Other Psychotic/Dissociative Symptoms:    Attention/Behavioral Symptoms  Withdrawn   Other Attention / Behavioral Symptoms:   Patient speaks softly and avoids eye contact. Patient answers with brief responses.    Cognitive Impairment  Within Normal Limits   Other Cognitive Impairment:   Patient alert and oriented.    Mood and Adjustment  Flat    Stress, Anxiety, Trauma, Any Recent Loss/Stressor  None reported   Anxiety (frequency):   N/A   Phobia (specify):   N/A   Compulsive behavior (specify):   N/A   Obsessive behavior (specify):   N/A   Other:   N/A   Substance Abuse/Use  None   SBIRT completed (please refer for detailed history):  N  Self-reported substance use:   Patient does not want to complete SBIRT and does not believe she has a substance abuse problem. Patient reports she does not use illegal drugs or alcohol but does admit that she uses additional pain medication than is prescribed. Patient reports if back  pain was manageable then she would take medications as prescribed.   Urinary Drug Screen Completed:  Y Alcohol level:   <11    Environmental/Housing/Living Arrangement  Stable housing   Who is in the home:   Husband   Emergency contact:  [place]   Patient's Strengths and Goals (patient's own words):   Patient reports supportive family.   Clinical Social Worker's Interpretive Summary:   CSW received referral in  order to complete psychosocial assessment. CSW reviewed chart and met with patient at bedside. CSW introduced myself and explained role.    Patient reports she lives at home with husband and has two grown children. Patient states that she started having back problems in her mid 6s and has struggled with back pain ever since. Patient reports she is now disabled and is unable to ambulate on her own. Patient states that due to medical conditions, her depression has increased and she has found it difficult to manage her pain.    Patient reports that about 8 months ago she attempted to cut her wrists due to feeling that she could no longer manage her pain. Patient was brought to the hospital and sent to [facility] in [place], [place] for about 1 week. Patient has been following up on an outpatient basis with Dr. Caprice Beaver and feels she has a good connection with psychiatrist. Patient does not complete any therapy and reports she is not interested in referral despite CSW discussing the benefits of ongoing supportive care. Patient reports when at home she attempted to overdose due to being unable to manage pain again. Patient continues to report if pain cannot be controlled then she does not want to live like this.    Patient denies any psychotic features and is guarded when discussing substance use. Patient does not believe she is addicted to any substances and does not feel that abusing her pain medication is considered an addiction. Patient reports that she has been to pain clinics in the past and they were not beneficial.    CSW explained psych MD recommendations for inpatient placement. CSW explained that patient would have to be medically stable prior to DC to inpatient facility. Patient aware and agreeable to plans and agreeable for CSW to contact husband and explain plans. CSW spoke with husband via phone who is concerned about patient's wellbeing and reports due to depression patient has not been eating and  not taking care of herself. Husband feels that inpatient placement is warranted and that patient will benefit from services.    Per attending MD, patient to transfer to [facility]. CSW will alert psych CSW at [facility] and update on DC plans.   Disposition:  Recommend Psych CSW continuing to support while in hospital   [place],  [phone]

## 2013-09-18 ENCOUNTER — Ambulatory Visit: Payer: No Typology Code available for payment source | Attending: Family Medicine | Admitting: Physical Therapy

## 2013-09-18 ENCOUNTER — Inpatient Hospital Stay (HOSPITAL_COMMUNITY): Payer: Managed Care, Other (non HMO)

## 2013-09-18 DIAGNOSIS — J9601 Acute respiratory failure with hypoxia: Secondary | ICD-10-CM

## 2013-09-18 DIAGNOSIS — E43 Unspecified severe protein-calorie malnutrition: Secondary | ICD-10-CM | POA: Insufficient documentation

## 2013-09-18 LAB — CBC
HCT: 43.7 % (ref 36.0–46.0)
Hemoglobin: 14.9 g/dL (ref 12.0–15.0)
MCH: 34.1 pg — AB (ref 26.0–34.0)
MCHC: 34.1 g/dL (ref 30.0–36.0)
MCV: 100 fL (ref 78.0–100.0)
PLATELETS: 351 10*3/uL (ref 150–400)
RBC: 4.37 MIL/uL (ref 3.87–5.11)
RDW: 13.8 % (ref 11.5–15.5)
WBC: 10.8 10*3/uL — ABNORMAL HIGH (ref 4.0–10.5)

## 2013-09-18 LAB — TROPONIN I: Troponin I: <0.3 ng/mL (ref <0.30)

## 2013-09-18 LAB — BASIC METABOLIC PANEL
BUN: 5 mg/dL — ABNORMAL LOW (ref 6–23)
CO2: 21 mEq/L (ref 19–32)
Calcium: 9.3 mg/dL (ref 8.4–10.5)
Chloride: 92 mEq/L — ABNORMAL LOW (ref 96–112)
Creatinine, Ser: 0.4 mg/dL — ABNORMAL LOW (ref 0.50–1.10)
Glucose, Bld: 86 mg/dL (ref 70–99)
Potassium: 3.6 mEq/L — ABNORMAL LOW (ref 3.7–5.3)
SODIUM: 136 meq/L — AB (ref 137–147)

## 2013-09-18 MED ORDER — BIOTENE DRY MOUTH MT LIQD
15.0000 mL | Freq: Two times a day (BID) | OROMUCOSAL | Status: DC
  Administered 2013-09-19: 15 mL via OROMUCOSAL

## 2013-09-18 MED ORDER — ENOXAPARIN SODIUM 30 MG/0.3ML ~~LOC~~ SOLN
30.0000 mg | SUBCUTANEOUS | Status: DC
  Administered 2013-09-18: 30 mg via SUBCUTANEOUS
  Filled 2013-09-18 (×2): qty 0.3

## 2013-09-18 MED ORDER — AMLODIPINE BESYLATE 10 MG PO TABS
10.0000 mg | ORAL_TABLET | Freq: Every day | ORAL | Status: DC
  Administered 2013-09-18 – 2013-09-23 (×6): 10 mg via ORAL
  Filled 2013-09-18 (×7): qty 1

## 2013-09-18 MED ORDER — CHLORHEXIDINE GLUCONATE 0.12 % MT SOLN
15.0000 mL | Freq: Two times a day (BID) | OROMUCOSAL | Status: DC
  Administered 2013-09-19 – 2013-09-20 (×2): 15 mL via OROMUCOSAL
  Filled 2013-09-18 (×4): qty 15

## 2013-09-18 MED ORDER — ALBUTEROL SULFATE (2.5 MG/3ML) 0.083% IN NEBU
2.5000 mg | INHALATION_SOLUTION | Freq: Four times a day (QID) | RESPIRATORY_TRACT | Status: DC
  Administered 2013-09-18 – 2013-09-19 (×6): 2.5 mg via RESPIRATORY_TRACT
  Filled 2013-09-18 (×6): qty 3

## 2013-09-18 MED ORDER — HYDRALAZINE HCL 20 MG/ML IJ SOLN: 10.0000 mg | Freq: Four times a day (QID) | INTRAMUSCULAR | Status: DC | PRN

## 2013-09-18 NOTE — Consult Note (Signed)
PULMONARY / CRITICAL CARE MEDICINE   Name: Rachael Ramirez MRN: [id number] DOB: January 08, 1971    ADMISSION DATE:  09/14/2013 CONSULTATION DATE:  09/14/2013  REFERRING MD :  Dhungel PRIMARY SERVICE: TRH  CHIEF COMPLAINT:  Back pain  BRIEF PATIENT DESCRIPTION: 43 y/o female with a long history of chronic low back pain, bipolar disorder, narcotic abuse and suicide attempts was admitted on 3/20 with an intentional trazodone overdose.  PCCM consulted on 3/23 when the patient developed left lower lobe collapse likely due to a mucus plug.  SIGNIFICANT EVENTS / STUDIES:  3/23 MRI lumbar spine> likely small epidural abscess L4-5; increased edema of L4-5 verteba worrisome for infection 3/23 CT angio chest > no PE, LLL collapse  LINES / TUBES:   CULTURES: 3/23 blood >>  ANTIBIOTICS: 3/23 vanc >> 3/23 ceftaz >>  HISTORY OF PRESENT ILLNESS:  43 y/o female with a long history of chronic low back pain, bipolar disorder, narcotic abuse and suicide attempts was admitted on 3/20 with an intentional trazodone overdose.  PCCM consulted on 3/23 when the patient developed left lower lobe collapse likely due to a mucus plug. She notes that she felt some chest discomfort and then shortness of breath.  No prior nausea, vomiting, or choking on foods. She has baseline asthma and smokes cigarettes. She has a weak cough.  SUBJECTIVE:  Lethargic but follows commands  VITAL SIGNS: Temp:  [97.4 F (36.3 C)-98.7 F (37.1 C)] 98 F (36.7 C) (03/24 0800) Pulse Rate:  [91-117] 110 (03/24 0800) Resp:  [15-23] 16 (03/24 0800) BP: (130-164)/(90-110) 146/107 mmHg (03/24 0800) SpO2:  [92 %-100 %] 100 % (03/24 0800) Weight:  [42.3 kg (93 lb 4.1 oz)] 42.3 kg (93 lb 4.1 oz) (03/23 2103) HEMODYNAMICS:   VENTILATOR SETTINGS:   INTAKE / OUTPUT: Intake/Output     03/23 0701 - 03/24 0700 03/24 0701 - 03/25 0700   P.O. 0    IV Piggyback 350    Total Intake(mL/kg) 350 (8.3)    Urine (mL/kg/hr) 1600 (1.6)    Total  Output 1600     Net -1250            PHYSICAL EXAMINATION: General: resting comfortably in bed, follows commands HEENT: NCAT, PERRL, EOMi PULM: Decreased bs CV: Tachy, regular, no mgr AB: BS+, soft, nontender Ext: warm no edema Neuro: Awake and lethargic but follows commands  LABS:  CBC  Recent Labs Lab 09/15/13 0235 09/17/13 1145 09/18/13 0324  WBC 7.5 6.9 10.8*  HGB 11.3* 13.5 14.9  HCT 33.6* 40.9 43.7  PLT 355 411* 351   Coag's No results found for this basename: APTT, INR,  in the last 168 hours BMET  Recent Labs Lab 09/14/13 1602 09/14/13 2119 09/15/13 0235 09/18/13 0324  NA 142  --  142 136*  K 3.2*  --  3.6* 3.6*  CL 99  --  104 92*  CO2 28  --  27 21  BUN 5*  --  <3* 5*  CREATININE 0.53 0.58 0.55 0.40*  GLUCOSE 139*  --  97 86   Electrolytes  Recent Labs Lab 09/14/13 1602 09/14/13 2119 09/15/13 0235 09/18/13 0324  CALCIUM 9.4  --  8.1* 9.3  MG 2.3 2.1  --   --    Sepsis Markers No results found for this basename: LATICACIDVEN, PROCALCITON, O2SATVEN,  in the last 168 hours ABG  Recent Labs Lab 09/17/13 1545  PHART 7.427  PCO2ART 39.3  PO2ART 55.8*   Liver Enzymes  Recent Labs  Lab 09/14/13 1602 09/15/13 0235  AST 12 9  ALT 5 <5  ALKPHOS 166* 134*  BILITOT 0.4 0.4  ALBUMIN 2.9* 2.4*   Cardiac Enzymes  Recent Labs Lab 09/15/13 0845 09/17/13 1545 09/17/13 2337  TROPONINI <0.30 <0.30 <0.30   Glucose No results found for this basename: GLUCAP,  in the last 168 hours  Imaging  Ct Angio Chest Pe W/cm &/or Wo Cm  09/17/2013   CLINICAL DATA:  Acute shortness of breath and chest pain. Rule out pulmonary embolus.  EXAM: CT ANGIOGRAPHY CHEST WITH CONTRAST  TECHNIQUE: Multidetector CT imaging of the chest was performed using the standard protocol during bolus administration of intravenous contrast. Multiplanar CT image reconstructions and MIPs were obtained to evaluate the vascular anatomy.  CONTRAST:  164m OMNIPAQUE IOHEXOL 350  MG/ML SOLN  COMPARISON:  Chest radiograph 09/17/2013  FINDINGS: There is good pulmonary arterial tree opacification. No filling defects are seen to indicate pulmonary emboli. The heart is normal in size and shifted leftward in the thorax. 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 No enlarged mediastinal or hilar lymph nodes are identified. There is no pleural or pericardial effusion.  There is low-density material within the left lower lobe bronchus and within multiple left lower lobe segmental and subsegmental bronchi. There is complete collapse of the left lower lobe. Minimal subpleural opacity in the left upper lobe and lingula may reflect atelectasis. There is a small focus of ground-glass opacity medially in the right upper lobe, which may reflect atelectasis or inflammation/ infection.  The visualized portion the upper abdomen is unremarkable. There is slight posterior wedging of the T5 vertebral body, which appears chronic.  Review of the MIP images confirms the above findings.  IMPRESSION: 1. No evidence of pulmonary emboli. 2. Low-density material within left lower lobe bronchi with complete left lower lobe collapse. Findings may reflect mucus plugging. Radiographic follow-up is recommended to ensure resolution.   Electronically Signed   By: ALogan Bores  On: 09/17/2013 17:59   Mr Lumbar Spine W Wo Contrast  09/17/2013   CLINICAL DATA:  Severe low back pain.  EXAM: MRI LUMBAR SPINE WITHOUT AND WITH CONTRAST  TECHNIQUE: Multiplanar and multiecho pulse sequences of the lumbar spine were obtained without and with intravenous contrast.  CONTRAST:  813mMULTIHANCE GADOBENATE DIMEGLUMINE 529 MG/ML IV SOLN  COMPARISON:  08/23/2013  FINDINGS: There has been interval increase in the loculated epidural fluid collection in the left lateral recess at L4-5 now extending more posteriorly and compressing the left side of the thecal sac. The small fluid collection in left lateral recess with  peripheral enhancement persists but is slightly smaller but now fluid from this tracks posteriorly along the left side of the thecal sac and into the laminectomy defect.  There is increased edema and enhancement of the L4 and L5 vertebra. There is no abnormal enhancement of the disc space and a tiny amount of fluid that was in the disc space on the prior study has resolved. The disc space is not narrowed.  The small midline subcutaneous fluid collection at the operative level has almost resolved.  The remainder of the lumbar spine is unchanged without significant abnormalities.  IMPRESSION: 1. Increased epidural fluid collection in the left lateral recess and along the left side of the thecal sac at L4-5, worrisome for a small epidural abscess. 2. Increased edema and enhancement in the L4 and L5 vertebra which may be discogenic in origin but given the adjacent possible  infection, I am concerned that this could represent osteomyelitis. Critical Value/emergent results were called by telephone at the time of interpretation on 09/17/2013 at 8:31 AM to the attending physician, who verbally acknowledged these results.   Electronically Signed   By: Rozetta Nunnery M.D.   On: 09/17/2013 08:30   Dg Chest Port 1 View  09/18/2013   CLINICAL DATA:  Left lower lobe collapse.  EXAM: PORTABLE CHEST - 1 VIEW  COMPARISON:  CT scan and chest x-rays dated 09/17/2013 and chest x-ray dated 09/15/2013  FINDINGS: There is persistent collapse and consolidation of the left lower lobe. There is now a tiny left pleural effusion. Heart and other mediastinal structures are shifted to the left. Right lung is clear.  IMPRESSION: Persistent  complete atelectasis of the left lower lobe.   Electronically Signed   By: Rozetta Nunnery M.D.   On: 09/18/2013 07:47   Dg Chest Port 1 View  09/17/2013   CLINICAL DATA:  Shortness breath.  EXAM: PORTABLE CHEST - 1 VIEW  COMPARISON:  09/15/2013.  FINDINGS: Interval development of collapse of the left lower  lobe with mediastinal shift to left and elevation left hemidiaphragm. This may represent the presence of a mucous plugs. Follow-up until clearance recommended.  IMPRESSION: Interval development of collapse of the left lower lobe with mediastinal shift to left and elevation left hemidiaphragm. This may represent the presence of a mucous plugs. Follow-up until clearance recommended.  This is a call report.  Legrand Como radiology technologist)   Electronically Signed   By: Chauncey Cruel M.D.   On: 09/17/2013 16:56     ASSESSMENT / PLAN:  PULMONARY A: Acute left lower lobe collapse most likely due to mucus plugging from weak cough from splinting, no clear aspiration; currently causing hypoxemia Currently protecting airway, not in respiratory distress Asthma with out exacerbation P:   -treat with aggressive pulmonary toilette -incentive spirometry -chest PT -BIPAP for positive pressure to open up lung -repeat CXR 3/25 -if no improvement, intubate for bronchoscopy  CARDIOVASCULAR A: Sinus tach, likely volume depletion P:  -gentle IVF   INFECTIOUS A:  Small epidural abscess L4-5 spine and likely vertebral body osteomyelitis P:   -ESR, blood culture -to [facility] for NSGY evaluation when respiratory status improved, will need invasive procedure per NS -started vanc/ceftaz  NEUROLOGIC A:  Suicide attempt by drug overdose> will need inpatient psychiatry eval Bipolar disorder Narcotic dependent P:   -low dose IV fentanyl -prn ativan -will need psyche evaluation after medical issues addressed    Richardson Landry Minor ACNP Maryanna Shape PCCM Pager [phone] till 3 pm If no answer page [phone] 09/18/2013, 10:00 AM   STAFF SUMMARY:   09/17/13: Currently hypoxemic but not in acute distress, protecting airway, will likely benefit from positive pressure and pulm toilette, if no improvement will intubate and perform bronchoscopy.    3/24 MR notes: will [facility] to [facility] for possible surgical  intervention of spinal fluid collection. PCCM will assume primary due to transfer.  Hold off bronch . CXR findings are new 3/24./15 and not seen Jan and even earlier in 09/14/13. Possible with incentive spirometry this can improve unless she aspirated foreign body   Dr. Brand Males, M.D., [facility].C.P Pulmonary and Critical Care Medicine Staff Physician [facility] Pager: [phone], If no answer or between  15:00h - 7:00h: call [phone]  09/18/2013 10:38 AM

## 2013-09-18 NOTE — Progress Notes (Signed)
UR completed. Jazleen Robeck RN CCM Case Mgmt phone [phone] 

## 2013-09-18 NOTE — Progress Notes (Signed)
PT Cancellation Note  Patient Details Name: Wende BushyRachel L Maniscalco MRN: [id number] DOB: 07/24/1970   Cancelled Treatment:    Reason Eval/Treat Not Completed: Medical issues which prohibited therapy, respiratory distress, on NRB mask, in ICU   Rada HayHill, [place] Nabor Elizabeth 09/18/2013, 7:19 AM

## 2013-09-18 NOTE — Progress Notes (Signed)
[facility] PROGRESS NOTE  Rachael Ramirez QIW:[id number] DOB: Oct 06, 1970 DOA: 09/14/2013 PCP: Leonard Downing, MD   Brief narrative  43 y.o. Female with PMHx depression, anxiety, bipolar disorder,Hx suicide attempt 2014, chronic back pain, multiple spinal surgeries presented with medication overdose, consumed 28 tablets of trazodone and mother called 911. 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 Winfield Cunas (neurosurgery); Patient was discharged on clonazepam, cyclobenzaprine, Norco 10-325, Lamictal 100 mg, Lyrica 300 mg, trazodone 100 mg. Patient monitored on telemetry. Serial EKG and troponins negative. Seen by psych consult and recommended inpatient psych once medically cleared. Patient reported persistent low back pain and an MRI of the lumbar spine with and without contrast was obtained which showed increased epidural fluid collection in the left lateral recess along the left side of the thecal sac at L4-L5 worrisome for a small epidural abscess. Also concern for osteomyelitis at that level.  Discussed with neurosx Dr Christella Noa, who recommended transfer to [facility] . Patient was being moved for transfer but became acutely hypoxic to 80s and even on 6L via [place] improved only to 88%. Patient was tachycardic as well and c/o chest discomfort. EKG showed sinus tachycardia. Troponin negative. CXR shows collapse of left lung with possible mucus plug.  ABG showed normal PH and PCO2 with ow of 56 on 6L.  CT angio chest done to r/o PE and was negative but again showed complete left lower lobe collapase with Mucus plugging.  patient placed on NRB. transferred to stepdown and PCCM consulted.    Assessment/Plan:  Drug overdose with trazodone -clinically stable. mental status at baseline.  ED contacted poison control, recommended monitor for at least 6 hrs; ECG NSR.  trop negative. Table on telemetry. -Patient attempted suicide last year and was  committed to [place] per her husband. Per husband patient currently has a drug abuse problem, and believes patient needs an inpatient program i.e. involuntary commitment.  -Seen by psychiatry consult and recommend need  for inpatient treatment once medically/ surgically  cleared.   Acute hypoxic resp failure  secondary to left lung collapse from mucus plugging. Patient required BiPAP overnight . Now o2 sats stable on RA and improving with bedside incentive spirometry. CXR still shows left lung collapse  PCCM will hold off on bronchoscopy.  Continue nebs  Chronic low back pain multiple spinal surgeries; last (06/2013 L4- 5 disectomy); limited lower extremity movement because of pain but no gross weakness, bowel or urinary symptoms.. Sensation intact. MRI of the lower -3/21: will obtain MRI L spine due to progressive pain was done which showed increased epidural fluid collection in the left lateral recess along the left side of the thecal sac at L4-L5 worrisome for a small epidural abscess. Also concern for osteomyelitis at that level. Spoke with her surgeon Dr. Christella Noa who informed that patient has been noncompliant with followup and blood works and recommended obtaining an ESR. -cont pain control; added cymbalta. -started on empiric abx on 3/23 transfer to [facility] for neurosx evaluation. Patient will be taken over care by PCCM at surgical ICU.   Suicide attempt/history of suicide;  as above  -Patient on suicide watch; need inpatient psychiatry treatment . transfer to [facility] once ready.  . Bipolar dx resume Lamictal  . Depression  started cymbalta , as per  psychiatric condition   Anxiety  patient on clonazepam    7. COPD  CXR: unremarkable, cont bronchodilators   8. HTN  started amlodipine ,  titrate dose as needed.  Code Status: full  Family Communication: d/w patient, called and updated Merry Proud, husband at [phone] Disposition Plan: Transfer to [facility]     Code Status: Full  code Family Communication: None at bedside Disposition Plan: Transfer to [facility]. informed accepting hospitalist Dr Tat over the phone   Consultants:  Neurosurgery (Dr. Cyndy Freeze will see patient at [facility])  PSYCHIATRY    Procedures:  none  Antibiotics:  none  HPI/Subjective: Patient seen and examined this morning. Denies being suicidal however complains of severe pain over her low back.  Objective: Filed Vitals:   09/18/13 0800  BP: 146/107  Pulse: 110  Temp: 98 F (36.7 C)  Resp: 16    Intake/Output Summary (Last 24 hours) at 09/18/13 1042 Last data filed at 09/18/13 1751  Gross per 24 hour  Intake    350 ml  Output   1600 ml  Net  -1250 ml   Filed Weights   09/14/13 2100 09/17/13 2103  Weight: 42.7 kg (94 lb 2.2 oz) 42.3 kg (93 lb 4.1 oz)    Exam:   General: Middle-aged thin built female in no acute distress  HEENT: No pallor, moist oral mucosa  Chest: diminished breath sounds over left lung  CVS: S1 and S2 tachycardic , no murmurs rub or gallop  Abdomen: Soft, nontender, nondistended, bowel sounds present  Extremities: Tenderness over her lower back and the lumbar surgical site and lumbar paraspinal area. Normal sensations, strength over lower legs limited due to pain.   CNS: AAO x3  Data Reviewed: Basic Metabolic Panel:  Recent Labs Lab 09/14/13 1602 09/14/13 2119 09/15/13 0235 09/18/13 0324  NA 142  --  142 136*  K 3.2*  --  3.6* 3.6*  CL 99  --  104 92*  CO2 28  --  27 21  GLUCOSE 139*  --  97 86  BUN 5*  --  <3* 5*  CREATININE 0.53 0.58 0.55 0.40*  CALCIUM 9.4  --  8.1* 9.3  MG 2.3 2.1  --   --    Liver Function Tests:  Recent Labs Lab 09/14/13 1602 09/15/13 0235  AST 12 9  ALT 5 <5  ALKPHOS 166* 134*  BILITOT 0.4 0.4  PROT 7.0 6.2  ALBUMIN 2.9* 2.4*   No results found for this basename: LIPASE, AMYLASE,  in the last 168 hours No results found for this basename: AMMONIA,  in the last 168 hours CBC:  Recent  Labs Lab 09/14/13 1602 09/14/13 2119 09/15/13 0235 09/17/13 1145 09/18/13 0324  WBC 7.8 6.8 7.5 6.9 10.8*  NEUTROABS 5.8  --  5.7  --   --   HGB 13.1 11.4* 11.3* 13.5 14.9  HCT 39.2 33.6* 33.6* 40.9 43.7  MCV 102.9* 102.1* 102.4* 99.0 100.0  PLT 366 359 355 411* 351   Cardiac Enzymes:  Recent Labs Lab 09/14/13 2119 09/15/13 0235 09/15/13 0845 09/17/13 1545 09/17/13 2337  TROPONINI <0.30 <0.30 <0.30 <0.30 <0.30   BNP (last 3 results) No results found for this basename: PROBNP,  in the last 8760 hours CBG: No results found for this basename: GLUCAP,  in the last 168 hours  No results found for this or any previous visit (from the past 240 hour(s)).   Studies: Ct Angio Chest Pe W/cm &/or Wo Cm  09/17/2013   CLINICAL DATA:  Acute shortness of breath and chest pain. Rule out pulmonary embolus.  EXAM: CT ANGIOGRAPHY CHEST WITH CONTRAST  TECHNIQUE: Multidetector CT imaging  of the chest was performed using the standard protocol during bolus administration of intravenous contrast. Multiplanar CT image reconstructions and MIPs were obtained to evaluate the vascular anatomy.  CONTRAST:  126m OMNIPAQUE IOHEXOL 350 MG/ML SOLN  COMPARISON:  Chest radiograph 09/17/2013  FINDINGS: There is good pulmonary arterial tree opacification. No filling defects are seen to indicate pulmonary emboli. The heart is normal in size and shifted leftward in the thorax. 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 No enlarged mediastinal or hilar lymph nodes are identified. There is no pleural or pericardial effusion.  There is low-density material within the left lower lobe bronchus and within multiple left lower lobe segmental and subsegmental bronchi. There is complete collapse of the left lower lobe. Minimal subpleural opacity in the left upper lobe and lingula may reflect atelectasis. There is a small focus of ground-glass opacity medially in the right upper lobe, which may reflect  atelectasis or inflammation/ infection.  The visualized portion the upper abdomen is unremarkable. There is slight posterior wedging of the T5 vertebral body, which appears chronic.  Review of the MIP images confirms the above findings.  IMPRESSION: 1. No evidence of pulmonary emboli. 2. Low-density material within left lower lobe bronchi with complete left lower lobe collapse. Findings may reflect mucus plugging. Radiographic follow-up is recommended to ensure resolution.   Electronically Signed   By: ALogan Bores  On: 09/17/2013 17:59   Mr Lumbar Spine W Wo Contrast  09/17/2013   CLINICAL DATA:  Severe low back pain.  EXAM: MRI LUMBAR SPINE WITHOUT AND WITH CONTRAST  TECHNIQUE: Multiplanar and multiecho pulse sequences of the lumbar spine were obtained without and with intravenous contrast.  CONTRAST:  818mMULTIHANCE GADOBENATE DIMEGLUMINE 529 MG/ML IV SOLN  COMPARISON:  08/23/2013  FINDINGS: There has been interval increase in the loculated epidural fluid collection in the left lateral recess at L4-5 now extending more posteriorly and compressing the left side of the thecal sac. The small fluid collection in left lateral recess with peripheral enhancement persists but is slightly smaller but now fluid from this tracks posteriorly along the left side of the thecal sac and into the laminectomy defect.  There is increased edema and enhancement of the L4 and L5 vertebra. There is no abnormal enhancement of the disc space and a tiny amount of fluid that was in the disc space on the prior study has resolved. The disc space is not narrowed.  The small midline subcutaneous fluid collection at the operative level has almost resolved.  The remainder of the lumbar spine is unchanged without significant abnormalities.  IMPRESSION: 1. Increased epidural fluid collection in the left lateral recess and along the left side of the thecal sac at L4-5, worrisome for a small epidural abscess. 2. Increased edema and enhancement  in the L4 and L5 vertebra which may be discogenic in origin but given the adjacent possible infection, I am concerned that this could represent osteomyelitis. Critical Value/emergent results were called by telephone at the time of interpretation on 09/17/2013 at 8:31 AM to the attending physician, who verbally acknowledged these results.   Electronically Signed   By: JiRozetta Nunnery.D.   On: 09/17/2013 08:30   Dg Chest Port 1 View  09/18/2013   CLINICAL DATA:  Left lower lobe collapse.  EXAM: PORTABLE CHEST - 1 VIEW  COMPARISON:  CT scan and chest x-rays dated 09/17/2013 and chest x-ray dated 09/15/2013  FINDINGS: There is persistent collapse and consolidation of the  left lower lobe. There is now a tiny left pleural effusion. Heart and other mediastinal structures are shifted to the left. Right lung is clear.  IMPRESSION: Persistent  complete atelectasis of the left lower lobe.   Electronically Signed   By: Rozetta Nunnery M.D.   On: 09/18/2013 07:47   Dg Chest Port 1 View  09/17/2013   CLINICAL DATA:  Shortness breath.  EXAM: PORTABLE CHEST - 1 VIEW  COMPARISON:  09/15/2013.  FINDINGS: Interval development of collapse of the left lower lobe with mediastinal shift to left and elevation left hemidiaphragm. This may represent the presence of a mucous plugs. Follow-up until clearance recommended.  IMPRESSION: Interval development of collapse of the left lower lobe with mediastinal shift to left and elevation left hemidiaphragm. This may represent the presence of a mucous plugs. Follow-up until clearance recommended.  This is a call report.  Legrand Como radiology technologist)   Electronically Signed   By: Chauncey Cruel M.D.   On: 09/17/2013 16:56    Scheduled Meds: . albuterol  2.5 mg Nebulization Q6H  . alum & mag hydroxide-simeth  30 mL Oral Once  . amLODipine  10 mg Oral Daily  . cefTAZidime (FORTAZ)  IV  2 g Intravenous 3 times per day  . clonazePAM  0.5 mg Oral BID  . DULoxetine  30 mg Oral Daily  .  enoxaparin (LOVENOX) injection  30 mg Subcutaneous Q24H  . feeding supplement (ENSURE COMPLETE)  237 mL Oral TID BM  . lamoTRIgine  100 mg Oral Daily  . nicotine  21 mg Transdermal Daily  . pantoprazole  40 mg Oral Daily  . pregabalin  200 mg Oral Daily  . senna-docusate  1 tablet Oral BID  . vancomycin  500 mg Intravenous Q8H   Continuous Infusions:      Time spent: 25 minutes    Alexandro Line  [facility] Pager [phone] If 7PM-7AM, please contact night-coverage at [email or web address], password [facility] 09/18/2013, 10:42 AM  LOS: 4 days

## 2013-09-18 NOTE — Progress Notes (Signed)
Clinical Social Work  CSW went to room to meet with patient but patient working with respiratory therapist. Per chart review[facility] CSW will continue to follow.  [place] Ameya [place], [place] [phone]

## 2013-09-19 ENCOUNTER — Inpatient Hospital Stay (HOSPITAL_COMMUNITY): Payer: Managed Care, Other (non HMO)

## 2013-09-19 DIAGNOSIS — J9600 Acute respiratory failure, unspecified whether with hypoxia or hypercapnia: Secondary | ICD-10-CM

## 2013-09-19 LAB — VANCOMYCIN, TROUGH: Vancomycin Tr: 10.5 ug/mL (ref 10.0–20.0)

## 2013-09-19 MED ORDER — ACETYLCYSTEINE 20 % IN SOLN
4.0000 mL | Freq: Two times a day (BID) | RESPIRATORY_TRACT | Status: DC
  Administered 2013-09-20: 4 mL via RESPIRATORY_TRACT
  Filled 2013-09-19 (×4): qty 4

## 2013-09-19 MED ORDER — ALBUTEROL SULFATE (2.5 MG/3ML) 0.083% IN NEBU: 2.5000 mg | INHALATION_SOLUTION | RESPIRATORY_TRACT | Status: DC | PRN

## 2013-09-19 MED ORDER — VANCOMYCIN HCL IN DEXTROSE 750-5 MG/150ML-% IV SOLN
750.0000 mg | Freq: Three times a day (TID) | INTRAVENOUS | Status: DC
  Administered 2013-09-19 – 2013-09-21 (×6): 750 mg via INTRAVENOUS
  Filled 2013-09-19 (×9): qty 150

## 2013-09-19 NOTE — Progress Notes (Signed)
ANTIBIOTIC CONSULT NOTE - Follow-up  Pharmacy Consult for Vancomycin Indication: epidural abscess  Allergies  Allergen Reactions  . Minocycline Nausea And Vomiting    Patient Measurements: Height: 5\' 3"  (160 cm) Weight: 93 lb 0.6 oz (42.2 kg) IBW/kg (Calculated) : 52.4  Vital Signs: BP: 109/62 mmHg (03/25 1108) Pulse Rate: 106 (03/25 1100) Intake/Output from previous day: 03/24 0701 - 03/25 0700 In: 780 [P.O.:330; IV Piggyback:450] Out: 550 [Urine:550] Intake/Output from this shift: Total I/O In: 120 [P.O.:120] Out: -   Labs:  Recent Labs  09/17/13 1145 09/18/13 0324  WBC 6.9 10.8*  HGB 13.5 14.9  PLT 411* 351  CREATININE  --  0.40*   Estimated Creatinine Clearance: 59.8 ml/min (by C-G formula based on Cr of 0.4).  Recent Labs  09/19/13 1320  VANCOTROUGH 10.5     Microbiology: Recent Results (from the past 720 hour(s))  CULTURE, BLOOD (ROUTINE X 2)     Status: None   Collection Time    09/17/13  8:55 PM      Result Value Ref Range Status   Specimen Description BLOOD LEFT ARM   Final   Special Requests BOTTLES DRAWN AEROBIC ONLY 10CC   Final   Culture  Setup Time     Final   Value: 09/18/2013 03:10     Performed at [facility]   Culture     Final   Value:        BLOOD CULTURE RECEIVED NO GROWTH TO DATE CULTURE WILL BE HELD FOR 5 DAYS BEFORE ISSUING A FINAL NEGATIVE REPORT     Performed at [facility]   Report Status PENDING   Incomplete  CULTURE, BLOOD (ROUTINE X 2)     Status: None   Collection Time    09/17/13  8:59 PM      Result Value Ref Range Status   Specimen Description BLOOD LEFT HAND   Final   Special Requests BOTTLES DRAWN AEROBIC ONLY 2CC   Final   Culture  Setup Time     Final   Value: 09/18/2013 03:10     Performed at [facility]   Culture     Final   Value:        BLOOD CULTURE RECEIVED NO GROWTH TO DATE CULTURE WILL BE HELD FOR 5 DAYS BEFORE ISSUING A FINAL NEGATIVE REPORT     Performed at [facility]   Report Status PENDING   Incomplete    Assessment: 43 y/o female with a long history of chronic low back pain s/p uncomplicated lumbar discectomy, bipolar disorder, narcotic abuse and suicide attempts was admitted on 3/20 with an intentional trazodone overdose.  3/23 MRI lumbar spine showed likely small epidural abscess L4-5; increased edema of L4-5 verteba worrisome for infection. She continues on IV vancomycin and ceftazidime. She afebrile and WBC is mildly elevated. Renal function is stable. A vancomycin trough was checked today and is subtherapeutic at 10.5. However, it was drawn 2 hours late making it difficult to interpret.   Goal of Therapy:  Vancomycin trough level 15-20 mcg/ml  Plan: 1. Change vancomycin to 750mg  IV Q8H - will recheck a trough tomorrow to reassess dose 2. F/u renal fxn, C&S, clinical status and [facility]  Addilyn Shaelee Forni, PharmD, BCPS Pager # [phone] 09/19/2013 2:51 PM

## 2013-09-19 NOTE — Consult Note (Signed)
PULMONARY / CRITICAL CARE MEDICINE   Name: Rachael BushyRachel L Currie MRN: [id number] DOB: 11/14/1970    ADMISSION DATE:  09/14/2013 CONSULTATION DATE:  09/14/2013  REFERRING MD :  Dhungel PRIMARY SERVICE: TRH  CHIEF COMPLAINT:  Back pain  BRIEF PATIENT DESCRIPTION: 43 y/o female with a long history of chronic low back pain, bipolar disorder, narcotic abuse and suicide attempts was admitted on 3/20 with an intentional trazodone overdose.  PCCM consulted on 3/23 when the patient developed left lower lobe collapse likely due to a mucus plug.  SIGNIFICANT EVENTS / STUDIES:  3/23 MRI lumbar spine> likely small epidural abscess L4-5; increased edema of L4-5 verteba worrisome for infection 3/23 CT angio chest > no PE, LLL collapse 3/24- moved to ICU , atx 3/25- no BIPAP, high flow 02  LINES / TUBES:  CULTURES: 3/23 blood >>  ANTIBIOTICS: 3/23 vanc >> 3/23 ceftaz >>  SUBJECTIVE:  Lethargic but follows commands, O2 needs high  VITAL SIGNS: Temp:  [97.7 F (36.5 C)-98.6 F (37 C)] 98.1 F (36.7 C) (03/24 1930) Pulse Rate:  [88-120] 106 (03/25 0800) Resp:  [12-24] 16 (03/25 0800) BP: (102-148)/(66-96) 115/70 mmHg (03/25 0800) SpO2:  [93 %-99 %] 99 % (03/25 0800) FiO2 (%):  [45 %-50 %] 45 % (03/25 0335) Weight:  [42.2 kg (93 lb 0.6 oz)] 42.2 kg (93 lb 0.6 oz) (03/25 0339) HEMODYNAMICS:   VENTILATOR SETTINGS: Vent Mode:  [-]  FiO2 (%):  [45 %-50 %] 45 % INTAKE / OUTPUT: Intake/Output     03/24 0701 - 03/25 0700 03/25 0701 - 03/26 0700   P.O. 330    IV Piggyback 450    Total Intake(mL/kg) 780 (18.5)    Urine (mL/kg/hr) 550 (0.5)    Total Output 550     Net +230            PHYSICAL EXAMINATION: General: resting comfortably in bed, follows commands, no distress, weak cough HEENT: NCAT, PERRL, EOMi PULM: Decreased bs left worse than rt CV: s1 s2 , regular, no mgr AB: BS+, soft, nontender Ext: warm no edema Neuro: made to be awake, moves al ext equal  LABS:  CBC  Recent  Labs Lab 09/15/13 0235 09/17/13 1145 09/18/13 0324  WBC 7.5 6.9 10.8*  HGB 11.3* 13.5 14.9  HCT 33.6* 40.9 43.7  PLT 355 411* 351   Coag's No results found for this basename: APTT, INR,  in the last 168 hours BMET  Recent Labs Lab 09/14/13 1602 09/14/13 2119 09/15/13 0235 09/18/13 0324  NA 142  --  142 136*  K 3.2*  --  3.6* 3.6*  CL 99  --  104 92*  CO2 28  --  27 21  BUN 5*  --  <3* 5*  CREATININE 0.53 0.58 0.55 0.40*  GLUCOSE 139*  --  97 86   Electrolytes  Recent Labs Lab 09/14/13 1602 09/14/13 2119 09/15/13 0235 09/18/13 0324  CALCIUM 9.4  --  8.1* 9.3  MG 2.3 2.1  --   --    Sepsis Markers No results found for this basename: LATICACIDVEN, PROCALCITON, O2SATVEN,  in the last 168 hours ABG  Recent Labs Lab 09/17/13 1545  PHART 7.427  PCO2ART 39.3  PO2ART 55.8*   Liver Enzymes  Recent Labs Lab 09/14/13 1602 09/15/13 0235  AST 12 9  ALT 5 <5  ALKPHOS 166* 134*  BILITOT 0.4 0.4  ALBUMIN 2.9* 2.4*   Cardiac Enzymes  Recent Labs Lab 09/15/13 0845 09/17/13 1545 09/17/13 2337  TROPONINI <0.30 <0.30 <0.30   Glucose No results found for this basename: GLUCAP,  in the last 168 hours  Imaging  Ct Angio Chest Pe W/cm &/or Wo Cm  09/17/2013   CLINICAL DATA:  Acute shortness of breath and chest pain. Rule out pulmonary embolus.  EXAM: CT ANGIOGRAPHY CHEST WITH CONTRAST  TECHNIQUE: Multidetector CT imaging of the chest was performed using the standard protocol during bolus administration of intravenous contrast. Multiplanar CT image reconstructions and MIPs were obtained to evaluate the vascular anatomy.  CONTRAST:  OMNIPAQUE IOHEXOL 350 MG/ML SOLN  COMPARISON:  Chest radiograph 09/17/2013  FINDINGS: There is good pulmonary arterial tree opacification. No filling defects are seen to indicate pulmonary emboli. The heart is normal in size and shifted leftward in the thorax. 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 No  enlarged mediastinal or hilar lymph nodes are identified. There is no pleural or pericardial effusion.  There is low-density material within the left lower lobe bronchus and within multiple left lower lobe segmental and subsegmental bronchi. There is complete collapse of the left lower lobe. Minimal subpleural opacity in the left upper lobe and lingula may reflect atelectasis. There is a small focus of ground-glass opacity medially in the right upper lobe, which may reflect atelectasis or inflammation/ infection.  The visualized portion the upper abdomen is unremarkable. There is slight posterior wedging of the T5 vertebral body, which appears chronic.  Review of the MIP images confirms the above findings.  IMPRESSION: 1. No evidence of pulmonary emboli. 2. Low-density material within left lower lobe bronchi with complete left lower lobe collapse. Findings may reflect mucus plugging. Radiographic follow-up is recommended to ensure resolution.   Electronically Signed   By: Sebastian Ache   On: 09/17/2013 17:59   Dg Chest Port 1 View  09/18/2013   CLINICAL DATA:  Left lower lobe collapse.  EXAM: PORTABLE CHEST - 1 VIEW  COMPARISON:  CT scan and chest x-rays dated 09/17/2013 and chest x-ray dated 09/15/2013  FINDINGS: There is persistent collapse and consolidation of the left lower lobe. There is now a tiny left pleural effusion. Heart and other mediastinal structures are shifted to the left. Right lung is clear.  IMPRESSION: Persistent  complete atelectasis of the left lower lobe.   Electronically Signed   By: Geanie Cooley M.D.   On: 09/18/2013 07:47   Dg Chest Port 1 View  09/17/2013   CLINICAL DATA:  Shortness breath.  EXAM: PORTABLE CHEST - 1 VIEW  COMPARISON:  09/15/2013.  FINDINGS: Interval development of collapse of the left lower lobe with mediastinal shift to left and elevation left hemidiaphragm. This may represent the presence of a mucous plugs. Follow-up until clearance recommended.  IMPRESSION:  Interval development of collapse of the left lower lobe with mediastinal shift to left and elevation left hemidiaphragm. This may represent the presence of a mucous plugs. Follow-up until clearance recommended.  This is a call report.  Casimiro Needle radiology technologist)   Electronically Signed   By: Bridgett Larsson M.D.   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 Tyson Alias, MD, FACP Pgr: [phone] [facility]

## 2013-09-19 NOTE — Progress Notes (Signed)
Unable to waste 50 mcg Fentanyl in pyxis from 0217 09/19/13 dose, [facility] Waste witnessed by Reuel BoomQueeneth Mbemena, RN prior to administering to pt. 50 mcg Fentanyl given IV/ 50 mcg Fentanyl wasted via sink.

## 2013-09-19 NOTE — Evaluation (Signed)
Physical Therapy Evaluation Patient Details Name: Rachael BushyRachel L Cropper MRN: [id number] DOB: 09/28/1970 Today's Date: 09/19/2013   History of Present Illness  43 y/o female with a long history of chronic low back pain, bipolar disorder, narcotic abuse and suicide attempts was admitted on 3/20 with an intentional trazodone overdose.  PCCM consulted on 3/23 when the patient developed left lower lobe collapse likely due to a mucus plug.  Clinical Impression  Pt adm with the above dx. Pt's functional mobility is limited by chronic LBP and generalized weakness. Pt states that between her husband and other family members she will have available assistance and supervision at home. Pt to benefit from skilled acute PT to address limitations listed below.    Follow Up Recommendations Home health PT;Supervision/Assistance - 24 hour    Equipment Recommendations  Rolling walker with 5" wheels    Recommendations for Other Services       Precautions / Restrictions Precautions Precautions: Fall Precaution Comments: generalized weakness due to high level of LBP Restrictions Weight Bearing Restrictions: No      Mobility  Bed Mobility Overal bed mobility: Needs Assistance Bed Mobility: Supine to Sit     Supine to sit: Min assist     General bed mobility comments: verbal and tactile cues for safety and hand placement. min A with LEs and trunk supoort  Transfers Overall transfer level: Needs assistance Equipment used: Rolling walker (2 wheeled) Transfers: Sit to/from Stand Sit to Stand: Min assist         General transfer comment: verbal cues for safety and instruction for sequencing.   Ambulation/Gait Ambulation/Gait assistance: Min guard Ambulation Distance (Feet): 30 Feet Assistive device: Rolling walker (2 wheeled) Gait Pattern/deviations: Step-to pattern;Shuffle;Decreased stride length;Antalgic;Narrow base of support Gait velocity: slow; guarded for pain   General Gait Details: pt req  RW for UE support. pt reports Bilat LE pain (L>R) and LBP limiting amb distance. Pt began to cry near the end of amb distance. Pt's Bilat UEs tremored during amb. Pt reports being very nervous to amb. HR rose to 140 during amb. pt reports mild dizziness at begining of amb activity   Stairs            Wheelchair Mobility    Modified Rankin (Stroke Patients Only)       Balance Overall balance assessment: Needs assistance Sitting-balance support: Feet supported;Bilateral upper extremity supported Sitting balance-Leahy Scale: Fair Sitting balance - Comments: pt able to sit EOB with Bilat UE support for 10 min   Standing balance support: Bilateral upper extremity supported Standing balance-Leahy Scale: Poor Standing balance comment: pt req RW for Bilat UE support. pt reports feeling dizzy upon standing                     Pertinent Vitals/Pain Pt reports LBP during functional mobility activities. Pt's HR rose to 140 during amb    Home Living Family/patient expects to be discharged to:: Private residence Living Arrangements: Spouse/significant other Available Help at Discharge: Family Type of Home: House Home Access: Stairs to enter Entrance Stairs-Rails: Left Entrance Stairs-Number of Steps: 4 Home Layout: One level Home Equipment: None Additional Comments: husbands works. pt has family in the area available to provide assistance    Prior Function Level of Independence: Independent         Comments: pt reports she didn't move around much but was able to perform all ADLs independently. Pt reports she was able to walk but not very well due to  the LBP      Hand Dominance        Extremity/Trunk Assessment   Upper Extremity Assessment: Generalized weakness           Lower Extremity Assessment: Generalized weakness (L more involved than R LE)         Communication   Communication: No difficulties  Cognition Arousal/Alertness: Awake/alert Behavior  During Therapy: Flat affect Overall Cognitive Status: History of cognitive impairments - at baseline (pt with known depression, anxiety and bipolar disorder)                      General Comments General comments (skin integrity, edema, etc.): pt requested to use the restroom during tx. min A and verbal cues req for toilet transfer. superviision for hyegiene clean.     Exercises        Assessment/Plan    PT Assessment Patient needs continued PT services  PT Diagnosis Difficulty walking;Generalized weakness (chronic LBP)   PT Problem List Decreased strength;Decreased range of motion;Decreased activity tolerance;Decreased balance;Decreased mobility;Decreased coordination;Decreased knowledge of use of DME;Decreased safety awareness;Pain  PT Treatment Interventions DME instruction;Gait training;Stair training;Functional mobility training;Therapeutic activities;Therapeutic exercise;Balance training;Patient/family education   PT Goals (Current goals can be found in the Care Plan section) Acute Rehab PT Goals Patient Stated Goal: return home PT Goal Formulation: With patient Time For Goal Achievement: 09/26/13 Potential to Achieve Goals: Good    Frequency Min 3X/week   Barriers to discharge        End of Session Equipment Utilized During Treatment: Gait belt;Oxygen (2 L of O2; RW) Activity Tolerance: Patient limited by pain Patient left: in chair;with call bell/phone within reach;with nursing/sitter in room         Time: 1348-1430 PT Time Calculation (min): 42 min   Charges:   PT Evaluation $Initial PT Evaluation Tier I: 1 Procedure PT Treatments $Gait Training: 8-22 mins $Therapeutic Activity: 8-22 mins   PT G CodesMalachi Paradise SPT 09/19/2013, 4:57 PM  Agree with above assessment.  Lewis Shock, PT, DPT Pager #: [phone] Office #: [phone]

## 2013-09-19 NOTE — Evaluation (Signed)
Physical Therapy Evaluation Patient Details Name: Rachael Ramirez MRN: [id number] DOB: 24-Jul-1970 Today's Date: 09/19/2013   History of Present Illness  43 y/o female with a long history of chronic low back pain, bipolar disorder, narcotic abuse and suicide attempts was admitted on 3/20 with an intentional trazodone overdose.  PCCM consulted on 3/23 when the patient developed left lower lobe collapse likely due to a mucus plug.  Clinical Impression  Pt adm with the above dx. Pt's functional mobility is limited by chronic LBP and generalized weakness. Pt states that between her husband and other family members she will have available assistance and supervision at home. Pt to benefit from skilled acute PT to address limitations listed below.    Follow Up Recommendations Home health PT;Supervision/Assistance - 24 hour    Equipment Recommendations  Rolling walker with 5" wheels    Recommendations for Other Services       Precautions / Restrictions Precautions Precautions: Fall Precaution Comments: generalized weakness due to high level of LBP Restrictions Weight Bearing Restrictions: No      Mobility  Bed Mobility Overal bed mobility: Needs Assistance Bed Mobility: Supine to Sit     Supine to sit: Min assist     General bed mobility comments: verbal and tactile cues for safety and hand placement. min A with LEs and trunk supoort  Transfers Overall transfer level: Needs assistance Equipment used: Rolling walker (2 wheeled) Transfers: Sit to/from Stand Sit to Stand: Min assist         General transfer comment: verbal cues for safety and instruction for sequencing.   Ambulation/Gait Ambulation/Gait assistance: Min guard Ambulation Distance (Feet): 10 Feet Assistive device: Rolling walker (2 wheeled) Gait Pattern/deviations: Step-to pattern;Shuffle;Decreased stride length;Antalgic;Narrow base of support Gait velocity: slow; guarded for pain   General Gait Details: pt req  RW for UE support. pt reports Bilat LE pain (L>R) and LBP limiting amb distance. Pt began to cry near the end of amb distance. Pt's Bilat UEs tremored during amb. Pt reports being very nervous to amb. HR rose to 140 during amb. pt reports mild dizziness at begining of amb activity   Stairs            Wheelchair Mobility    Modified Rankin (Stroke Patients Only)       Balance Overall balance assessment: Needs assistance Sitting-balance support: Feet supported;Bilateral upper extremity supported Sitting balance-Leahy Scale: Fair Sitting balance - Comments: pt able to sit EOB with Bilat UE support for 10 min   Standing balance support: Bilateral upper extremity supported Standing balance-Leahy Scale: Poor Standing balance comment: pt req RW for Bilat UE support. pt reports feeling dizzy upon standing                     Pertinent Vitals/Pain Pt reports LBP during functional mobility activities. Pt's HR rose to 140 during amb    Home Living Family/patient expects to be discharged to:: Private residence Living Arrangements: Spouse/significant other Available Help at Discharge: Family Type of Home: House Home Access: Stairs to enter Entrance Stairs-Rails: Left Entrance Stairs-Number of Steps: 4 Home Layout: One level Home Equipment: None Additional Comments: husbands works. pt has family in the area available to provide assistance    Prior Function Level of Independence: Independent         Comments: pt reports she didn't move around much but was able to perform all ADLs independently. Pt reports she was able to walk but not very well due to  the LBP      Hand Dominance        Extremity/Trunk Assessment   Upper Extremity Assessment: Generalized weakness           Lower Extremity Assessment: Generalized weakness (L more involved than R LE)         Communication   Communication: No difficulties  Cognition Arousal/Alertness: Awake/alert Behavior  During Therapy: Flat affect Overall Cognitive Status: History of cognitive impairments - at baseline                      General Comments General comments (skin integrity, edema, etc.): pt requested to use the restroom during tx. min A and verbal cues req for toilet transfer. superviision for hyegiene clean.     Exercises        Assessment/Plan    PT Assessment Patient needs continued PT services  PT Diagnosis Difficulty walking;Generalized weakness (chronic LBP)   PT Problem List Decreased strength;Decreased range of motion;Decreased activity tolerance;Decreased balance;Decreased mobility;Decreased coordination;Decreased knowledge of use of DME;Decreased safety awareness;Pain  PT Treatment Interventions DME instruction;Gait training;Stair training;Functional mobility training;Therapeutic activities;Therapeutic exercise;Balance training;Patient/family education   PT Goals (Current goals can be found in the Care Plan section) Acute Rehab PT Goals Patient Stated Goal: return home PT Goal Formulation: With patient Time For Goal Achievement: 09/26/13 Potential to Achieve Goals: Good    Frequency Min 3X/week   Barriers to discharge        End of Session Equipment Utilized During Treatment: Gait belt;Oxygen (2 L of O2; RW) Activity Tolerance: Patient limited by pain Patient left: in chair;with call bell/phone within reach;with nursing/sitter in room         Time: 1348-1430 PT Time Calculation (min): 42 min   Charges:         PT G Codes:          Bibi Economos SPT 09/19/2013, 3:00 PM

## 2013-09-19 NOTE — Progress Notes (Addendum)
NUTRITION FOLLOW-UP  DOCUMENTATION CODES Per approved criteria  -Severe malnutrition in the context of chronic illness   INTERVENTION: Continue Ensure Complete PO TID, each provides 350 kcal, 13 g protein. Monitor magnesium, potassium, and phosphorus daily for at least 3 days, MD to replete as needed, as pt is at risk for refeeding syndrome given ongoing malnutrition and inadequate intake. If pt continues to refuse meals, strongly recommend initiation of short-term enteral nutrition. RD to continue to follow nutrition care plan.  NUTRITION DIAGNOSIS: Inadequate oral intake related to pain as evidenced by minimal PO intake. Ongoing.  Goal: Patient will meet >/=90% of estimated nutrition needs. Unmet.  Monitor:  PO intake, weight, labs, supplement acceptance  ASSESSMENT: Patient with history of depression, anxiety, bipolar disorder, lumbar discectomy L4/5 in January 2015. She has chronic back pain. Admitted with drug overdose. She does have a history of drug abuse.   Pt with acute LLL collapse 2/2 mucus plugging. Currently on 42M.  Neurosurgery following pt 2/2 epidural abscess. Pt underwent an uncomplicated lumbar discetomy, complaining of back pain before and since the operation. Plan to await medical stability and then explore the wound.  Currently ordered for Regular diet. Refusing meals. Confirmed with suicide sitter.  Potassium is low at 3.6. Magnesium WNL.  Height: Ht Readings from Last 1 Encounters:  09/19/13 5\' 3"  (1.6 m)    Weight: Wt Readings from Last 1 Encounters:  09/19/13 93 lb 0.6 oz (42.2 kg)   BMI:  Body mass index is 16.48 kg/(m^2). Patient is underweight.   Estimated Nutritional Needs: Kcal: 1450-1675 kcal Protein: 60-70 g Fluid: >1.5 L/day  Skin: Intact  Diet Order: General   Intake/Output Summary (Last 24 hours) at 09/19/13 0910 Last data filed at 09/19/13 0606  Gross per 24 hour  Intake    780 ml  Output    550 ml  Net    230 ml     Last BM: PTA  Labs:   Recent Labs Lab 09/14/13 1602 09/14/13 2119 09/15/13 0235 09/18/13 0324  NA 142  --  142 136*  K 3.2*  --  3.6* 3.6*  CL 99  --  104 92*  CO2 28  --  27 21  BUN 5*  --  <3* 5*  CREATININE 0.53 0.58 0.55 0.40*  CALCIUM 9.4  --  8.1* 9.3  MG 2.3 2.1  --   --   GLUCOSE 139*  --  97 86    CBG (last 3)  No results found for this basename: GLUCAP,  in the last 72 hours  Scheduled Meds: . albuterol  2.5 mg Nebulization Q6H  . alum & mag hydroxide-simeth  30 mL Oral Once  . amLODipine  10 mg Oral Daily  . antiseptic oral rinse  15 mL Mouth Rinse q12n4p  . cefTAZidime (FORTAZ)  IV  2 g Intravenous 3 times per day  . chlorhexidine  15 mL Mouth Rinse BID  . clonazePAM  0.5 mg Oral BID  . DULoxetine  30 mg Oral Daily  . enoxaparin (LOVENOX) injection  30 mg Subcutaneous Q24H  . feeding supplement (ENSURE COMPLETE)  237 mL Oral TID BM  . lamoTRIgine  100 mg Oral Daily  . nicotine  21 mg Transdermal Daily  . pantoprazole  40 mg Oral Daily  . pregabalin  200 mg Oral Daily  . senna-docusate  1 tablet Oral BID  . vancomycin  500 mg Intravenous Q8H    Continuous Infusions:   Jarold MottoSamantha Kylena Mole MS, RD, LDN  Inpatient Registered Dietitian Pager: [phone] After-hours pager: [phone]

## 2013-09-19 NOTE — Progress Notes (Signed)
Patient ID: Rachael Ramirez, female   DOB: 07/17/1970, 43 y.o.   MRN: [id number] BP 99/63  Pulse 107  Temp(Src) 98.1 F (36.7 C) (Oral)  Resp 17  Ht 5\' 3"  (1.6 m)  Wt 42.2 kg (93 lb 0.6 oz)  BMI 16.48 kg/m2  SpO2 97%  LMP 09/02/2013 Alert, oriented x 4.  Speech is clear and fluent OR tomorrow for exploration of wound. Needs to have lovenox stopped today.  No lab evidence for infection. Unlikely circumstances for epidural abcess, will open and see what is there. Risks, and benefits explained.

## 2013-09-20 ENCOUNTER — Encounter (HOSPITAL_COMMUNITY): Payer: Self-pay | Admitting: Anesthesiology

## 2013-09-20 ENCOUNTER — Encounter (HOSPITAL_COMMUNITY): Payer: Managed Care, Other (non HMO) | Admitting: Anesthesiology

## 2013-09-20 ENCOUNTER — Inpatient Hospital Stay (HOSPITAL_COMMUNITY): Payer: Managed Care, Other (non HMO) | Admitting: Anesthesiology

## 2013-09-20 ENCOUNTER — Inpatient Hospital Stay (HOSPITAL_COMMUNITY): Payer: Managed Care, Other (non HMO)

## 2013-09-20 LAB — CBC WITH DIFFERENTIAL/PLATELET
Basophils Absolute: 0 10*3/uL (ref 0.0–0.1)
Basophils Relative: 0 % (ref 0–1)
Eosinophils Absolute: 0.2 10*3/uL (ref 0.0–0.7)
Eosinophils Relative: 2 % (ref 0–5)
HCT: 37.8 % (ref 36.0–46.0)
HEMOGLOBIN: 12.7 g/dL (ref 12.0–15.0)
Lymphocytes Relative: 23 % (ref 12–46)
Lymphs Abs: 1.9 10*3/uL (ref 0.7–4.0)
MCH: 34 pg (ref 26.0–34.0)
MCHC: 33.6 g/dL (ref 30.0–36.0)
MCV: 101.3 fL — ABNORMAL HIGH (ref 78.0–100.0)
MONO ABS: 0.8 10*3/uL (ref 0.1–1.0)
MONOS PCT: 9 % (ref 3–12)
NEUTROS ABS: 5.5 10*3/uL (ref 1.7–7.7)
Neutrophils Relative %: 66 % (ref 43–77)
Platelets: 403 10*3/uL — ABNORMAL HIGH (ref 150–400)
RBC: 3.73 MIL/uL — AB (ref 3.87–5.11)
RDW: 13.8 % (ref 11.5–15.5)
WBC: 8.3 10*3/uL (ref 4.0–10.5)

## 2013-09-20 LAB — COMPREHENSIVE METABOLIC PANEL
ALT: 5 U/L (ref 0–35)
AST: 10 U/L (ref 0–37)
Albumin: 2.4 g/dL — ABNORMAL LOW (ref 3.5–5.2)
Alkaline Phosphatase: 106 U/L (ref 39–117)
BUN: 4 mg/dL — ABNORMAL LOW (ref 6–23)
CALCIUM: 8.8 mg/dL (ref 8.4–10.5)
CO2: 33 mEq/L — ABNORMAL HIGH (ref 19–32)
Chloride: 97 mEq/L (ref 96–112)
Creatinine, Ser: 0.48 mg/dL — ABNORMAL LOW (ref 0.50–1.10)
GFR calc non Af Amer: >90 mL/min (ref >90)
GLUCOSE: 118 mg/dL — AB (ref 70–99)
Potassium: 2.4 mEq/L — CL (ref 3.7–5.3)
Sodium: 140 mEq/L (ref 137–147)
Total Bilirubin: 0.3 mg/dL (ref 0.3–1.2)
Total Protein: 6.2 g/dL (ref 6.0–8.3)

## 2013-09-20 LAB — SURGICAL PCR SCREEN
MRSA, PCR: NEGATIVE
Staphylococcus aureus: POSITIVE — AB

## 2013-09-20 MED ORDER — LACTATED RINGERS IV SOLN
INTRAVENOUS | Status: AC | PRN
  Administered 2013-09-20: 18:00:00 via INTRAVENOUS

## 2013-09-20 MED ORDER — ROCURONIUM BROMIDE 50 MG/5ML IV SOLN
INTRAVENOUS | Status: AC
  Filled 2013-09-20: qty 1

## 2013-09-20 MED ORDER — POTASSIUM CHLORIDE CRYS ER 20 MEQ PO TBCR
50.0000 meq | EXTENDED_RELEASE_TABLET | Freq: Once | ORAL | Status: AC
  Administered 2013-09-20: 50 meq via ORAL
  Filled 2013-09-20: qty 1

## 2013-09-20 MED ORDER — ACETYLCYSTEINE 20 % IN SOLN
4.0000 mL | Freq: Four times a day (QID) | RESPIRATORY_TRACT | Status: DC
  Administered 2013-09-20 – 2013-09-22 (×7): 4 mL via RESPIRATORY_TRACT
  Filled 2013-09-20 (×12): qty 4

## 2013-09-20 MED ORDER — LIDOCAINE HCL (CARDIAC) 20 MG/ML IV SOLN
INTRAVENOUS | Status: AC
  Filled 2013-09-20: qty 5

## 2013-09-20 MED ORDER — HEMOSTATIC AGENTS (NO CHARGE) OPTIME
TOPICAL | Status: DC | PRN
  Administered 2013-09-20: 1 via TOPICAL

## 2013-09-20 MED ORDER — POTASSIUM CHLORIDE CRYS ER 20 MEQ PO TBCR: 40.0000 meq | EXTENDED_RELEASE_TABLET | ORAL | Status: DC

## 2013-09-20 MED ORDER — ONDANSETRON HCL 4 MG/2ML IJ SOLN
INTRAMUSCULAR | Status: AC
  Filled 2013-09-20: qty 2

## 2013-09-20 MED ORDER — MUPIROCIN 2 % EX OINT
1.0000 "application " | TOPICAL_OINTMENT | Freq: Two times a day (BID) | CUTANEOUS | Status: DC
  Administered 2013-09-20: 1 via NASAL

## 2013-09-20 MED ORDER — TRAMADOL HCL 50 MG PO TABS
50.0000 mg | ORAL_TABLET | Freq: Four times a day (QID) | ORAL | Status: DC | PRN
  Administered 2013-09-22 (×2): 50 mg via ORAL
  Filled 2013-09-20 (×2): qty 1

## 2013-09-20 MED ORDER — MIDAZOLAM HCL 2 MG/2ML IJ SOLN
INTRAMUSCULAR | Status: AC
  Filled 2013-09-20: qty 2

## 2013-09-20 MED ORDER — POTASSIUM CHLORIDE CRYS ER 20 MEQ PO TBCR
40.0000 meq | EXTENDED_RELEASE_TABLET | Freq: Once | ORAL | Status: AC
  Administered 2013-09-20: 40 meq via ORAL
  Filled 2013-09-20: qty 2

## 2013-09-20 MED ORDER — POTASSIUM CHLORIDE 10 MEQ/100ML IV SOLN
10.0000 meq | INTRAVENOUS | Status: DC
  Administered 2013-09-20: 10 meq via INTRAVENOUS
  Filled 2013-09-20: qty 100

## 2013-09-20 MED ORDER — ALBUTEROL SULFATE (2.5 MG/3ML) 0.083% IN NEBU
2.5000 mg | INHALATION_SOLUTION | Freq: Four times a day (QID) | RESPIRATORY_TRACT | Status: DC
  Administered 2013-09-20 – 2013-09-22 (×7): 2.5 mg via RESPIRATORY_TRACT
  Filled 2013-09-20 (×8): qty 3

## 2013-09-20 MED ORDER — CHLORHEXIDINE GLUCONATE CLOTH 2 % EX PADS
6.0000 | MEDICATED_PAD | Freq: Every day | CUTANEOUS | Status: DC
  Administered 2013-09-20 – 2013-09-23 (×4): 6 via TOPICAL

## 2013-09-20 MED ORDER — MUPIROCIN 2 % EX OINT
TOPICAL_OINTMENT | CUTANEOUS | Status: AC
  Filled 2013-09-20: qty 22

## 2013-09-20 MED ORDER — 0.9 % SODIUM CHLORIDE (POUR BTL) OPTIME
TOPICAL | Status: DC | PRN
  Administered 2013-09-20: 1000 mL

## 2013-09-20 MED ORDER — PROPOFOL 10 MG/ML IV BOLUS
INTRAVENOUS | Status: AC
  Filled 2013-09-20: qty 20

## 2013-09-20 MED ORDER — FENTANYL CITRATE 0.05 MG/ML IJ SOLN
INTRAMUSCULAR | Status: AC
Start: 2013-09-20 — End: 2013-09-20
  Filled 2013-09-20: qty 5

## 2013-09-20 MED ORDER — POTASSIUM CHLORIDE CRYS ER 20 MEQ PO TBCR: 50.0000 meq | EXTENDED_RELEASE_TABLET | Freq: Once | ORAL | Status: DC

## 2013-09-20 MED ORDER — THROMBIN 5000 UNITS EX SOLR
CUTANEOUS | Status: DC | PRN
Start: 2013-09-20 — End: 2013-09-20
  Administered 2013-09-20 (×2): 5000 [IU] via TOPICAL

## 2013-09-20 NOTE — Care Management Note (Signed)
    Page 1 of 2   09/24/2013     11:21:47 AM   CARE MANAGEMENT NOTE 09/24/2013  Patient:  Rachael Ramirez,Rachael Ramirez   Account Number:  [id number]  Date Initiated:  09/18/2013  Documentation initiated by:  [facility]AL[facility]  Subjective/Objective Assessment:   S/P lumbar discetomy 07/13/2013, drug overdose, hypoxia     Action/Plan:   lives at home with mother  PT eval ordered   Anticipated DC Date:  09/23/2013   Anticipated DC Plan:  HOME W HOME HEALTH SERVICES  In-house referral  Clinical Social Worker      DC Associate Professorlanning Services  CM consult      [facility]  [facility]  DURABLE MEDICAL EQUIPMENT   Choice offered to / List presented to:  C-1 Patient   DME arranged  [facility]      DME agency  [facility]     HH arranged  HH-2 PT      HH agency  [facility]   Status of service:  Completed, signed off Medicare Important Message given?   (If response is "NO", the following Medicare IM given date fields will be blank) Date Medicare IM given:   Date Additional Medicare IM given:    Discharge Disposition:  HOME W HOME HEALTH SERVICES  Per UR Regulation:  Reviewed for med. necessity/level of care/duration of stay  If discussed at Long Length of Stay Meetings, dates discussed:    Comments:  09/24/13- 1000- Donn PieriniKristi Crosley Stejskal RN BSN [phone] Referral for DME- RW- called to [facility] and orders faxed this am- equipment to be delivered to [facility]'s [facility]- [facility]- services to begin within the next 24-48 hr. - Pt called this am regarding pain med needs - floor charge RN called pt's PCP regarding this and gave needed info.  09/23/13- 1745- Donn PieriniKristi Kyana Aicher RN, BSN [phone] After hours on call received regarding d/c needs for HH-PT and DME- RW- spoke with pt over phone - and verified that pt was agreeable to services- and pt does want RW for home- pt states that her husband will be with her tonight and she will have someone with  her off and on tomorrow- [facility] - [place] per pt [facility]- explained to pt that for her RW - she would need to use [facility] for her DME needs- referrals to be called in- explained to pt that it would be Monday before agencies received referral- pt voices understanding and agrees with plan - address and phone # verified with pt.    09/20/13- 1050- Donn PieriniKristi Kansas Spainhower RN, BSN [phone]  plan for exploration of lumbar wound and I&D if needed later today-will need psych evaluation after medical issues addressed for intentional overdose on admit-- per PT notes recommendation at this time for HH-PT- will follow as pt progresses as to if pt will need inpt psych vs [facility] medically stable for discharge.

## 2013-09-20 NOTE — Preoperative (Signed)
Beta Blockers   Reason not to administer Beta Blockers:Not Applicable 

## 2013-09-20 NOTE — Progress Notes (Signed)
Patient's husband, Leotis ShamesJeffery, called and updated on patient's status. Requesting time of patient's surgery, however not known at this time. Will update pt's husband in morning once surgery time is available.

## 2013-09-20 NOTE — Clinical Social Work Note (Signed)
Psych CSW following due to intentional overdose-at this time we are awaiting medical stability for Psych eval. Full assessment to follow- please call as/if needed. Reece LevyJanet Tramell Piechota, MSW, Theresia MajorsLCSWA [phone]

## 2013-09-20 NOTE — Progress Notes (Signed)
PT Cancellation Note  Patient Details Name: Wende BushyRachel L Sanabia MRN: [id number] DOB: 02/21/1971   Cancelled Treatment:    Reason Eval/Treat Not Completed: Medical issues which prohibited therapy. Pt on bedrest today and planned for procedure due to epidural abscess. Will re-attempt to see when pt is medically ready.    Donnamarie PoagWest, Alika Eppes Pen[place]onN, [place] [phone] 09/20/2013, 9:55 AM

## 2013-09-20 NOTE — Progress Notes (Signed)
Patient ID: Rachael BushyRachel L Arena, female   DOB: 06/21/1971, 43 y.o.   MRN: [id number] BP 99/67  Pulse 95  Temp(Src) 98.3 F (36.8 C) (Oral)  Resp 16  Ht 5\' 3"  (1.6 m)  Wt 42.1 kg (92 lb 13 oz)  BMI 16.45 kg/m2  SpO2 93%  LMP 09/02/2013 Case cancelled for lovenox administration.  Will simply add on for tomorrow. Explained situation to Mrs. ChadWest.

## 2013-09-20 NOTE — Progress Notes (Signed)
PULMONARY / CRITICAL CARE MEDICINE  Name: Rachael BushyRachel L Gau MRN: [id number] DOB: 08/27/1970    ADMISSION DATE:  09/14/2013 CONSULTATION DATE:  09/14/2013  REFERRING MD :  Dhungel PRIMARY SERVICE: TRH  CHIEF COMPLAINT:  Back pain  BRIEF PATIENT DESCRIPTION: 43 y/o female with a long history of chronic low back pain, bipolar disorder, narcotic abuse and suicide attempts was admitted on 3/20 with an intentional trazodone overdose.  PCCM consulted on 3/23 when the patient developed left lower lobe collapse likely due to a mucus plug.  SIGNIFICANT EVENTS / STUDIES:  3/23 MRI lumbar spine >>> Likely small epidural abscess L4-5; increased edema of L4-5 verteba worrisome for infection 3/23 CT angio chest >>> No PE, LLL collapse  LINES / TUBES:  CULTURES: 3/23 Blood >>>  ANTIBIOTICS: 3/23 Vancomycin >> 3/23 Ceftazidime >>  SUBJECTIVE:  Doing well, no overnight issues.  VITAL SIGNS: Temp:  [97.3 F (36.3 C)-98.7 F (37.1 C)] 98.5 F (36.9 C) (03/26 0700) Pulse Rate:  [81-114] 94 (03/26 0821) Resp:  [11-21] 12 (03/26 0821) BP: (84-112)/(56-73) 99/73 mmHg (03/26 0821) SpO2:  [93 %-99 %] 97 % (03/26 0858) Weight:  [42.1 kg (92 lb 13 oz)] 42.1 kg (92 lb 13 oz) (03/25 2050)  HEMODYNAMICS:   VENTILATOR SETTINGS:   INTAKE / OUTPUT: Intake/Output     03/25 0701 - 03/26 0700 03/26 0701 - 03/27 0700   P.O. 300    IV Piggyback 350 50   Total Intake(mL/kg) 650 (15.4) 50 (1.2)   Urine (mL/kg/hr) 475 (0.5)    Total Output 475     Net +175 +50        Urine Occurrence 1 x      PHYSICAL EXAMINATION: General: Comfortable, no distress HEENT: NCAT, PERRL PULM: Diminished bilateral air entry CV: Regular, no murmurs AB: Soft, non tender Ext: no edema Neuro: Non focal, awake  LABS:  CBC  Recent Labs Lab 09/17/13 1145 09/18/13 0324 09/20/13 0300  WBC 6.9 10.8* 8.3  HGB 13.5 14.9 12.7  HCT 40.9 43.7 37.8  PLT 411* 351 403*   Coag's No results found for this basename: APTT, INR,   in the last 168 hours  BMET  Recent Labs Lab 09/15/13 0235 09/18/13 0324 09/20/13 0300  NA 142 136* 140  K 3.6* 3.6* 2.4*  CL 104 92* 97  CO2 27 21 33*  BUN <3* 5* 4*  CREATININE 0.55 0.40* 0.48*  GLUCOSE 97 86 118*   Electrolytes  Recent Labs Lab 09/14/13 1602 09/14/13 2119 09/15/13 0235 09/18/13 0324 09/20/13 0300  CALCIUM 9.4  --  8.1* 9.3 8.8  MG 2.3 2.1  --   --   --    Sepsis Markers No results found for this basename: LATICACIDVEN, PROCALCITON, O2SATVEN,  in the last 168 hours  ABG  Recent Labs Lab 09/17/13 1545  PHART 7.427  PCO2ART 39.3  PO2ART 55.8*   Liver Enzymes  Recent Labs Lab 09/14/13 1602 09/15/13 0235 09/20/13 0300  AST 12 9 10   ALT 5 <5 5  ALKPHOS 166* 134* 106  BILITOT 0.4 0.4 0.3  ALBUMIN 2.9* 2.4* 2.4*   Cardiac Enzymes  Recent Labs Lab 09/15/13 0845 09/17/13 1545 09/17/13 2337  TROPONINI <0.30 <0.30 <0.30   Glucose No results found for this basename: GLUCAP,  in the last 168 hours  IMAGING:  Dg Chest Port 1 View  09/20/2013   CLINICAL DATA:  Left lower lobe atelectasis  EXAM: PORTABLE CHEST - 1 VIEW  COMPARISON:  DG CHEST 1V  PORT dated 09/19/2013  FINDINGS: Stable volume loss at the left base. Right lung clear. No pneumothorax. Normal heart size.  IMPRESSION: Stable volume loss at the left base.   Electronically Signed   By: Maryclare Bean M.D.   On: 09/20/2013 08:13   Dg Chest Port 1 View  09/19/2013   CLINICAL DATA:  Follow-up left lower lobe atelectasis  EXAM: PORTABLE CHEST - 1 VIEW  COMPARISON:  09/18/2013  FINDINGS: Cardiomediastinal silhouette is stable. No acute infiltrate. Persistent left lower lobe atelectasis. No pulmonary edema.  IMPRESSION: Persistent left lower lobe atelectasis.  No pulmonary edema.   Electronically Signed   By: Natasha Mead M.D.   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  Blood culture to follow  Continue abx as above  OR today by Neurosurgery  Trend CBC  NTN  Norvasc  Hydralazine PRN  Suicide attempt by drug overdose Bipolar disorder Narcotic dependent  Tylenol / Tramadol / Morphine / Ativan  Klonopin / Lamictal / Lyrica  Psych consult when stable  Nicotine patch  Hypokalemia  Replace 50 x 1  Trend BMP  Prophylaxis  GI - not indicated - d/c Protonix  VTE - SCD  Disposition  Will sign out to [facility] if stable postoperativelly  I have personally obtained history, examined patient, evaluated and interpreted laboratory and imaging results, reviewed medical records, formulated assessment / plan and placed orders.  Lonia Farber, MD Pulmonary and Critical Care Medicine [facility] Pager: [phone]  09/20/2013, 10:53 AM

## 2013-09-20 NOTE — Anesthesia Preprocedure Evaluation (Addendum)
Anesthesia Evaluation  Patient identified by MRN, date of birth, ID band Patient awake    Reviewed: Allergy & Precautions, H&P , NPO status , Patient's Chart, lab work & pertinent test results, reviewed documented beta blocker date and time   History of Anesthesia Complications (+) PONV and history of anesthetic complications  Airway Mallampati: II TM Distance: >3 FB Neck ROM: Full    Dental  (+) Edentulous Upper, Edentulous Lower   Pulmonary asthma , Current Smoker,          Cardiovascular     Neuro/Psych  Headaches, Anxiety Depression Bipolar Disorder    GI/Hepatic Neg liver ROS, GERD-  Medicated and Controlled,  Endo/Other  negative endocrine ROS  Renal/GU negative Renal ROS     Musculoskeletal negative musculoskeletal ROS (+)   Abdominal   Peds  Hematology negative hematology ROS (+)   Anesthesia Other Findings   Reproductive/Obstetrics negative OB ROS                          Anesthesia Physical Anesthesia Plan Anesthesia Quick Evaluation

## 2013-09-20 NOTE — Progress Notes (Addendum)
Physician notified: Yaco At: 1016  Regarding: Pt had K+2.4 this AM, received 40mEq PO, and one run K so far. Needs 5 more IVPB, also has IV abx to run through 1 PIV. pt refuses second PIV and only lets K run at 50 due to pain. Scheduled surgery today with Cabbell.  Awaiting return response.    Returned Response at: 1018  Order(s): Contact Dr. Herma CarsonZ  Physician notified: Dr. Herma CarsonZ At: 1020   Awaiting return response.   Returned Response at: 1021  Order(s): Continue running IVPB K+  Physician notified: Cabbell by office staff At: 1031  Awaiting return response.   Returned Response at: 1041  Order(s): Give PO replacement potassium change from IVPB. Planned procedure will be this evening.

## 2013-09-21 ENCOUNTER — Inpatient Hospital Stay (HOSPITAL_COMMUNITY): Payer: Managed Care, Other (non HMO) | Admitting: Anesthesiology

## 2013-09-21 ENCOUNTER — Encounter (HOSPITAL_COMMUNITY): Payer: Self-pay | Admitting: Anesthesiology

## 2013-09-21 ENCOUNTER — Encounter (HOSPITAL_COMMUNITY)
Admission: RE | Disposition: A | Discharge status: Home Health | Payer: Self-pay | Source: Home / Self Care | Attending: Internal Medicine

## 2013-09-21 ENCOUNTER — Encounter (HOSPITAL_COMMUNITY): Payer: Managed Care, Other (non HMO) | Admitting: Anesthesiology

## 2013-09-21 ENCOUNTER — Inpatient Hospital Stay (HOSPITAL_COMMUNITY): Payer: Managed Care, Other (non HMO)

## 2013-09-21 HISTORY — PX: LUMBAR WOUND DEBRIDEMENT: SHX1988

## 2013-09-21 LAB — CBC
HCT: 35.3 % — ABNORMAL LOW (ref 36.0–46.0)
Hemoglobin: 12 g/dL (ref 12.0–15.0)
MCH: 34.4 pg — AB (ref 26.0–34.0)
MCHC: 34 g/dL (ref 30.0–36.0)
MCV: 101.1 fL — ABNORMAL HIGH (ref 78.0–100.0)
PLATELETS: 413 10*3/uL — AB (ref 150–400)
RBC: 3.49 MIL/uL — AB (ref 3.87–5.11)
RDW: 13.8 % (ref 11.5–15.5)
WBC: 6.7 10*3/uL (ref 4.0–10.5)

## 2013-09-21 LAB — POCT I-STAT 4, (NA,K, GLUC, HGB,HCT)
GLUCOSE: 89 mg/dL (ref 70–99)
HEMATOCRIT: 41 % (ref 36.0–46.0)
HEMOGLOBIN: 13.9 g/dL (ref 12.0–15.0)
Potassium: 3.7 mEq/L (ref 3.7–5.3)
Sodium: 141 mEq/L (ref 137–147)

## 2013-09-21 LAB — BASIC METABOLIC PANEL
BUN: 5 mg/dL — ABNORMAL LOW (ref 6–23)
CO2: 32 meq/L (ref 19–32)
CREATININE: 0.47 mg/dL — AB (ref 0.50–1.10)
Calcium: 9 mg/dL (ref 8.4–10.5)
Chloride: 97 mEq/L (ref 96–112)
GFR calc Af Amer: >90 mL/min (ref >90)
GFR calc non Af Amer: >90 mL/min (ref >90)
Glucose, Bld: 124 mg/dL — ABNORMAL HIGH (ref 70–99)
Potassium: 3.5 mEq/L — ABNORMAL LOW (ref 3.7–5.3)
Sodium: 141 mEq/L (ref 137–147)

## 2013-09-21 LAB — GRAM STAIN

## 2013-09-21 LAB — VANCOMYCIN, TROUGH: Vancomycin Tr: 25 ug/mL — ABNORMAL HIGH (ref 10.0–20.0)

## 2013-09-21 SURGERY — LUMBAR WOUND DEBRIDEMENT
Anesthesia: General

## 2013-09-21 SURGERY — LUMBAR WOUND DEBRIDEMENT
Anesthesia: General | Laterality: Bilateral

## 2013-09-21 MED ORDER — ONDANSETRON HCL 4 MG/2ML IJ SOLN
INTRAMUSCULAR | Status: AC
  Filled 2013-09-21: qty 2

## 2013-09-21 MED ORDER — LACTATED RINGERS IV SOLN
INTRAVENOUS | Status: DC | PRN
  Administered 2013-09-21 (×2): via INTRAVENOUS

## 2013-09-21 MED ORDER — VANCOMYCIN HCL IN DEXTROSE 750-5 MG/150ML-% IV SOLN
750.0000 mg | Freq: Two times a day (BID) | INTRAVENOUS | Status: DC
  Administered 2013-09-21 – 2013-09-23 (×4): 750 mg via INTRAVENOUS
  Filled 2013-09-21 (×5): qty 150

## 2013-09-21 MED ORDER — ONDANSETRON HCL 4 MG/2ML IJ SOLN: 4.0000 mg | Freq: Once | INTRAMUSCULAR | Status: DC | PRN

## 2013-09-21 MED ORDER — PROPOFOL 10 MG/ML IV BOLUS
INTRAVENOUS | Status: DC | PRN
  Administered 2013-09-21: 200 mg via INTRAVENOUS

## 2013-09-21 MED ORDER — GLYCOPYRROLATE 0.2 MG/ML IJ SOLN
INTRAMUSCULAR | Status: DC | PRN
  Administered 2013-09-21: .6 mg via INTRAVENOUS

## 2013-09-21 MED ORDER — PROPOFOL 10 MG/ML IV BOLUS
INTRAVENOUS | Status: AC
  Filled 2013-09-21: qty 20

## 2013-09-21 MED ORDER — HYDROMORPHONE HCL PF 1 MG/ML IJ SOLN
INTRAMUSCULAR | Status: AC
  Filled 2013-09-21: qty 2

## 2013-09-21 MED ORDER — HEMOSTATIC AGENTS (NO CHARGE) OPTIME
TOPICAL | Status: DC | PRN
  Administered 2013-09-21: 1 via TOPICAL

## 2013-09-21 MED ORDER — POTASSIUM CHLORIDE CRYS ER 20 MEQ PO TBCR
20.0000 meq | EXTENDED_RELEASE_TABLET | Freq: Once | ORAL | Status: AC
  Administered 2013-09-21: 20 meq via ORAL
  Filled 2013-09-21: qty 1

## 2013-09-21 MED ORDER — FENTANYL CITRATE 0.05 MG/ML IJ SOLN
INTRAMUSCULAR | Status: DC | PRN
  Administered 2013-09-21: 100 ug via INTRAVENOUS
  Administered 2013-09-21: 150 ug via INTRAVENOUS

## 2013-09-21 MED ORDER — BUPIVACAINE HCL (PF) 0.5 % IJ SOLN
INTRAMUSCULAR | Status: DC | PRN
  Administered 2013-09-21: 10 mL

## 2013-09-21 MED ORDER — LIDOCAINE HCL (CARDIAC) 20 MG/ML IV SOLN
INTRAVENOUS | Status: DC | PRN
  Administered 2013-09-21: 70 mg via INTRAVENOUS

## 2013-09-21 MED ORDER — GLYCOPYRROLATE 0.2 MG/ML IJ SOLN
INTRAMUSCULAR | Status: AC
  Filled 2013-09-21: qty 3

## 2013-09-21 MED ORDER — ONDANSETRON HCL 4 MG/2ML IJ SOLN
INTRAMUSCULAR | Status: DC | PRN
  Administered 2013-09-21: 4 mg via INTRAVENOUS

## 2013-09-21 MED ORDER — NEOSTIGMINE METHYLSULFATE 1 MG/ML IJ SOLN
INTRAMUSCULAR | Status: DC | PRN
  Administered 2013-09-21: 3 mg via INTRAVENOUS

## 2013-09-21 MED ORDER — 0.9 % SODIUM CHLORIDE (POUR BTL) OPTIME
TOPICAL | Status: DC | PRN
  Administered 2013-09-21: 1000 mL

## 2013-09-21 MED ORDER — THROMBIN 5000 UNITS EX SOLR
CUTANEOUS | Status: DC | PRN
  Administered 2013-09-21 (×2): 5000 [IU] via TOPICAL

## 2013-09-21 MED ORDER — ROCURONIUM BROMIDE 50 MG/5ML IV SOLN
INTRAVENOUS | Status: AC
  Filled 2013-09-21: qty 1

## 2013-09-21 MED ORDER — HYDROMORPHONE HCL PF 1 MG/ML IJ SOLN
0.2500 mg | INTRAMUSCULAR | Status: DC | PRN
  Administered 2013-09-21 (×3): 0.5 mg via INTRAVENOUS

## 2013-09-21 MED ORDER — FENTANYL CITRATE 0.05 MG/ML IJ SOLN
INTRAMUSCULAR | Status: AC
  Filled 2013-09-21: qty 5

## 2013-09-21 SURGICAL SUPPLY — 65 items
BAG DECANTER FOR FLEXI CONT (MISCELLANEOUS) IMPLANT
BENZOIN TINCTURE PRP APPL 2/3 (GAUZE/BANDAGES/DRESSINGS) IMPLANT
BLADE SURG ROTATE 9660 (MISCELLANEOUS) IMPLANT
CANISTER SUCT 3000ML (MISCELLANEOUS) ×3 IMPLANT
CLOSURE WOUND 1/2 X4 (GAUZE/BANDAGES/DRESSINGS)
CONT SPEC 4OZ CLIKSEAL STRL BL (MISCELLANEOUS) ×3 IMPLANT
DECANTER SPIKE VIAL GLASS SM (MISCELLANEOUS) IMPLANT
DRAPE LAPAROTOMY 100X72X124 (DRAPES) ×3 IMPLANT
DRAPE POUCH INSTRU U-SHP 10X18 (DRAPES) ×3 IMPLANT
DRAPE SURG 17X23 STRL (DRAPES) ×3 IMPLANT
DURAPREP 26ML APPLICATOR (WOUND CARE) ×3 IMPLANT
ELECT REM PT RETURN 9FT ADLT (ELECTROSURGICAL) ×3
ELECTRODE REM PT RTRN 9FT ADLT (ELECTROSURGICAL) ×1 IMPLANT
GAUZE SPONGE 4X4 16PLY XRAY LF (GAUZE/BANDAGES/DRESSINGS) IMPLANT
GLOVE BIO SURGEON STRL SZ 6.5 (GLOVE) IMPLANT
GLOVE BIO SURGEON STRL SZ7 (GLOVE) IMPLANT
GLOVE BIO SURGEON STRL SZ7.5 (GLOVE) IMPLANT
GLOVE BIO SURGEON STRL SZ8 (GLOVE) IMPLANT
GLOVE BIO SURGEON STRL SZ8.5 (GLOVE) IMPLANT
GLOVE BIO SURGEONS STRL SZ 6.5 (GLOVE)
GLOVE BIOGEL M 8.0 STRL (GLOVE) IMPLANT
GLOVE BIOGEL PI IND STRL 7.5 (GLOVE) ×1 IMPLANT
GLOVE BIOGEL PI INDICATOR 7.5 (GLOVE) ×2
GLOVE ECLIPSE 6.5 STRL STRAW (GLOVE) ×3 IMPLANT
GLOVE ECLIPSE 7.0 STRL STRAW (GLOVE) IMPLANT
GLOVE ECLIPSE 7.5 STRL STRAW (GLOVE) ×6 IMPLANT
GLOVE ECLIPSE 8.0 STRL XLNG CF (GLOVE) IMPLANT
GLOVE ECLIPSE 8.5 STRL (GLOVE) IMPLANT
GLOVE EXAM NITRILE LRG STRL (GLOVE) IMPLANT
GLOVE EXAM NITRILE MD LF STRL (GLOVE) IMPLANT
GLOVE EXAM NITRILE XL STR (GLOVE) IMPLANT
GLOVE EXAM NITRILE XS STR PU (GLOVE) IMPLANT
GLOVE INDICATOR 6.5 STRL GRN (GLOVE) IMPLANT
GLOVE INDICATOR 7.0 STRL GRN (GLOVE) IMPLANT
GLOVE INDICATOR 7.5 STRL GRN (GLOVE) IMPLANT
GLOVE INDICATOR 8.0 STRL GRN (GLOVE) IMPLANT
GLOVE INDICATOR 8.5 STRL (GLOVE) IMPLANT
GLOVE OPTIFIT SS 8.0 STRL (GLOVE) IMPLANT
GLOVE SURG SS PI 6.5 STRL IVOR (GLOVE) IMPLANT
GOWN BRE IMP SLV AUR LG STRL (GOWN DISPOSABLE) IMPLANT
GOWN BRE IMP SLV AUR XL STRL (GOWN DISPOSABLE) IMPLANT
GOWN STRL REIN 2XL LVL4 (GOWN DISPOSABLE) IMPLANT
GOWN STRL REUS W/ TWL LRG LVL3 (GOWN DISPOSABLE) ×1 IMPLANT
GOWN STRL REUS W/ TWL XL LVL3 (GOWN DISPOSABLE) ×1 IMPLANT
GOWN STRL REUS W/TWL LRG LVL3 (GOWN DISPOSABLE) ×2
GOWN STRL REUS W/TWL XL LVL3 (GOWN DISPOSABLE) ×2
KIT BASIN OR (CUSTOM PROCEDURE TRAY) ×3 IMPLANT
KIT ROOM TURNOVER OR (KITS) ×3 IMPLANT
NS IRRIG 1000ML POUR BTL (IV SOLUTION) ×3 IMPLANT
PACK LAMINECTOMY NEURO (CUSTOM PROCEDURE TRAY) ×3 IMPLANT
PAD ARMBOARD 7.5X6 YLW CONV (MISCELLANEOUS) ×9 IMPLANT
SPONGE GAUZE 4X4 12PLY (GAUZE/BANDAGES/DRESSINGS) IMPLANT
SPONGE LAP 4X18 X RAY DECT (DISPOSABLE) IMPLANT
SPONGE SURGIFOAM ABS GEL SZ50 (HEMOSTASIS) ×3 IMPLANT
STRIP CLOSURE SKIN 1/2X4 (GAUZE/BANDAGES/DRESSINGS) IMPLANT
SUT VIC AB 0 CT1 18XCR BRD8 (SUTURE) ×1 IMPLANT
SUT VIC AB 0 CT1 8-18 (SUTURE) ×2
SUT VIC AB 2-0 CT1 18 (SUTURE) ×3 IMPLANT
SUT VIC AB 3-0 SH 8-18 (SUTURE) ×3 IMPLANT
SWAB CULTURE LIQ STUART DBL (MISCELLANEOUS) IMPLANT
SYR 20ML ECCENTRIC (SYRINGE) ×3 IMPLANT
TOWEL OR 17X24 6PK STRL BLUE (TOWEL DISPOSABLE) ×3 IMPLANT
TOWEL OR 17X26 10 PK STRL BLUE (TOWEL DISPOSABLE) ×3 IMPLANT
TUBE ANAEROBIC SPECIMEN COL (MISCELLANEOUS) IMPLANT
WATER STERILE IRR 1000ML POUR (IV SOLUTION) ×3 IMPLANT

## 2013-09-21 SURGICAL SUPPLY — 65 items
BAG DECANTER FOR FLEXI CONT (MISCELLANEOUS) ×3 IMPLANT
BENZOIN TINCTURE PRP APPL 2/3 (GAUZE/BANDAGES/DRESSINGS) IMPLANT
BLADE SURG ROTATE 9660 (MISCELLANEOUS) IMPLANT
CANISTER SUCT 3000ML (MISCELLANEOUS) ×3 IMPLANT
CLOSURE WOUND 1/2 X4 (GAUZE/BANDAGES/DRESSINGS)
CONT SPEC 4OZ CLIKSEAL STRL BL (MISCELLANEOUS) ×3 IMPLANT
DECANTER SPIKE VIAL GLASS SM (MISCELLANEOUS) ×3 IMPLANT
DERMABOND ADHESIVE PROPEN (GAUZE/BANDAGES/DRESSINGS) ×2
DERMABOND ADVANCED .7 DNX6 (GAUZE/BANDAGES/DRESSINGS) ×1 IMPLANT
DRAPE LAPAROTOMY 100X72X124 (DRAPES) ×3 IMPLANT
DRAPE POUCH INSTRU U-SHP 10X18 (DRAPES) ×3 IMPLANT
DRAPE SURG 17X23 STRL (DRAPES) ×3 IMPLANT
DURAPREP 26ML APPLICATOR (WOUND CARE) ×3 IMPLANT
ELECT REM PT RETURN 9FT ADLT (ELECTROSURGICAL) ×3
ELECTRODE REM PT RTRN 9FT ADLT (ELECTROSURGICAL) ×1 IMPLANT
GAUZE SPONGE 4X4 16PLY XRAY LF (GAUZE/BANDAGES/DRESSINGS) IMPLANT
GLOVE BIO SURGEON STRL SZ 6.5 (GLOVE) IMPLANT
GLOVE BIO SURGEON STRL SZ7 (GLOVE) IMPLANT
GLOVE BIO SURGEON STRL SZ7.5 (GLOVE) ×3 IMPLANT
GLOVE BIO SURGEON STRL SZ8 (GLOVE) IMPLANT
GLOVE BIO SURGEON STRL SZ8.5 (GLOVE) IMPLANT
GLOVE BIO SURGEONS STRL SZ 6.5 (GLOVE)
GLOVE BIOGEL M 8.0 STRL (GLOVE) IMPLANT
GLOVE BIOGEL PI IND STRL 7.5 (GLOVE) ×1 IMPLANT
GLOVE BIOGEL PI INDICATOR 7.5 (GLOVE) ×2
GLOVE ECLIPSE 6.5 STRL STRAW (GLOVE) ×3 IMPLANT
GLOVE ECLIPSE 7.0 STRL STRAW (GLOVE) IMPLANT
GLOVE ECLIPSE 7.5 STRL STRAW (GLOVE) IMPLANT
GLOVE ECLIPSE 8.0 STRL XLNG CF (GLOVE) IMPLANT
GLOVE ECLIPSE 8.5 STRL (GLOVE) IMPLANT
GLOVE EXAM NITRILE LRG STRL (GLOVE) IMPLANT
GLOVE EXAM NITRILE MD LF STRL (GLOVE) IMPLANT
GLOVE EXAM NITRILE XL STR (GLOVE) IMPLANT
GLOVE EXAM NITRILE XS STR PU (GLOVE) IMPLANT
GLOVE INDICATOR 6.5 STRL GRN (GLOVE) IMPLANT
GLOVE INDICATOR 7.0 STRL GRN (GLOVE) IMPLANT
GLOVE INDICATOR 7.5 STRL GRN (GLOVE) IMPLANT
GLOVE INDICATOR 8.0 STRL GRN (GLOVE) IMPLANT
GLOVE INDICATOR 8.5 STRL (GLOVE) IMPLANT
GLOVE OPTIFIT SS 8.0 STRL (GLOVE) IMPLANT
GLOVE SURG SS PI 6.5 STRL IVOR (GLOVE) IMPLANT
GOWN BRE IMP SLV AUR LG STRL (GOWN DISPOSABLE) ×6 IMPLANT
GOWN BRE IMP SLV AUR XL STRL (GOWN DISPOSABLE) IMPLANT
GOWN STRL REIN 2XL LVL4 (GOWN DISPOSABLE) IMPLANT
GOWN STRL REUS W/ TWL XL LVL3 (GOWN DISPOSABLE) ×2 IMPLANT
GOWN STRL REUS W/TWL XL LVL3 (GOWN DISPOSABLE) ×4
KIT BASIN OR (CUSTOM PROCEDURE TRAY) ×3 IMPLANT
KIT ROOM TURNOVER OR (KITS) ×3 IMPLANT
NS IRRIG 1000ML POUR BTL (IV SOLUTION) ×3 IMPLANT
PACK LAMINECTOMY NEURO (CUSTOM PROCEDURE TRAY) ×3 IMPLANT
PAD ARMBOARD 7.5X6 YLW CONV (MISCELLANEOUS) ×9 IMPLANT
SPONGE GAUZE 4X4 12PLY (GAUZE/BANDAGES/DRESSINGS) IMPLANT
SPONGE LAP 4X18 X RAY DECT (DISPOSABLE) IMPLANT
SPONGE SURGIFOAM ABS GEL SZ50 (HEMOSTASIS) ×3 IMPLANT
STRIP CLOSURE SKIN 1/2X4 (GAUZE/BANDAGES/DRESSINGS) IMPLANT
SUT VIC AB 0 CT1 18XCR BRD8 (SUTURE) ×1 IMPLANT
SUT VIC AB 0 CT1 8-18 (SUTURE) ×2
SUT VIC AB 2-0 CT1 18 (SUTURE) ×3 IMPLANT
SUT VIC AB 3-0 SH 8-18 (SUTURE) ×3 IMPLANT
SWAB CULTURE LIQ STUART DBL (MISCELLANEOUS) IMPLANT
SYR 20ML ECCENTRIC (SYRINGE) ×3 IMPLANT
TOWEL OR 17X24 6PK STRL BLUE (TOWEL DISPOSABLE) ×3 IMPLANT
TOWEL OR 17X26 10 PK STRL BLUE (TOWEL DISPOSABLE) ×3 IMPLANT
TUBE ANAEROBIC SPECIMEN COL (MISCELLANEOUS) ×6 IMPLANT
WATER STERILE IRR 1000ML POUR (IV SOLUTION) ×3 IMPLANT

## 2013-09-21 NOTE — Op Note (Signed)
09/14/2013 - 09/21/2013  6:26 PM  PATIENT:  Rachael Ramirez  43 y.o. female  PRE-OPERATIVE DIAGNOSIS:  possible epidural infection  POST-OPERATIVE DIAGNOSIS:  Recurrent disc herniation L4/5 left, possible wound infection  PROCEDURE:  Procedure(s): LUMBAR WOUND re-exploration and disectomy L4/5  SURGEON:  Surgeon(s): Carmela HurtKyle L Lylliana Kitamura, MD  ASSISTANTS:none  ANESTHESIA:   general  EBL:  Total I/O In: 1100 [I.V.:1100] Out: 250 [Urine:250]  BLOOD ADMINISTERED:none  CELL SAVER GIVEN:none  COUNT:per nursing  DRAINS: none   SPECIMEN:  Source of Specimen:  wound, disc space  DICTATION: [name] was taken to the operating room on the presumption of a possible infection in the epidural space. She has had a great deal of pain thus I felt we should explore the wound. She was intubated and placed under a general anesthetic without difficulty. Her back was prepped and she was draped in a sterile manner. I opened the incision with a 10 blade and encountered clear somewhat thick fluid. It was not purulent. I sent swab sticks dipped in the fluid for culture. I exposed the L4 and L5 lamina on the left side. Using a small hook I was able to remove what was disc lateral to the thecal sac. I then went into the disc space, there was no epidural fluid collection, nor purulence. I removed more disc material from the disc space and sent that also for gram stain and culture. I irrigated the wound copiously, then closed in layers. I approximated the thoracolumbar fascia, subcutaneous, and subcuticular planes with vicryl sutures. I used dermabond for a sterile dressing.   PLAN OF CARE: Admit to inpatient   PATIENT DISPOSITION:  PACU - hemodynamically stable.   Delay start of Pharmacological VTE agent (>24hrs) due to surgical blood loss or risk of bleeding:  yes

## 2013-09-21 NOTE — Progress Notes (Signed)
PT Cancellation Note  Patient Details Name: Rachael Ramirez MRN: [id number] DOB: 08/11/1970   Cancelled Treatment:    Reason Eval/Treat Not Completed: Medical issues which prohibited therapy. Pt continues to be on bedrest and is planned for procedure today. Will re-attempt to see pt when medically ready.    Donnamarie PoagWest, Janesia [place] Walk[place], [place] [phone] 09/21/2013, 9:25 AM

## 2013-09-21 NOTE — Anesthesia Preprocedure Evaluation (Signed)
Anesthesia Evaluation  Patient identified by MRN, date of birth, ID band Patient awake    Reviewed: Allergy & Precautions, H&P , NPO status , Patient's Chart, lab work & pertinent test results  History of Anesthesia Complications (+) PONV and POST - OP SPINAL HEADACHE  Airway       Dental   Pulmonary asthma , Current Smoker,          Cardiovascular     Neuro/Psych  Headaches, Depression Bipolar Disorder    GI/Hepatic GERD-  ,  Endo/Other    Renal/GU      Musculoskeletal   Abdominal   Peds  Hematology   Anesthesia Other Findings   Reproductive/Obstetrics                           Anesthesia Physical Anesthesia Plan  ASA: II  Anesthesia Plan: General   Post-op Pain Management:    Induction: Intravenous  Airway Management Planned: Oral ETT  Additional Equipment:   Intra-op Plan:   Post-operative Plan: Extubation in OR  Informed Consent: I have reviewed the patients History and Physical, chart, labs and discussed the procedure including the risks, benefits and alternatives for the proposed anesthesia with the patient or authorized representative who has indicated his/her understanding and acceptance.     Plan Discussed with:   Anesthesia Plan Comments:         Anesthesia Quick Evaluation

## 2013-09-21 NOTE — Progress Notes (Signed)
PULMONARY / CRITICAL CARE MEDICINE  Name: Rachael Ramirez MRN: [id number] DOB: May 24, 1971    ADMISSION DATE:  09/14/2013 CONSULTATION DATE:  09/14/2013  REFERRING MD :  Dhungel PRIMARY SERVICE: TRH  CHIEF COMPLAINT:  Back pain  BRIEF PATIENT DESCRIPTION: 43 y/o female with a long history of chronic low back pain, bipolar disorder, narcotic abuse and suicide attempts was admitted on 3/20 with an intentional trazodone overdose.  PCCM consulted on 3/23 when the patient developed left lower lobe collapse likely due to a mucus plug.  SIGNIFICANT EVENTS / STUDIES:  3/23 MRI lumbar spine >>> Likely small epidural abscess L4-5; increased edema of L4-5 verteba worrisome for infection 3/23 CT angio chest >>> No PE, LLL collapse  LINES / TUBES:  CULTURES: 3/23 Blood >>>  ANTIBIOTICS: 3/23 Vancomycin >> 3/23 Ceftazidime >>  SUBJECTIVE:  No issues overnight. K replaced.  VITAL SIGNS: Temp:  [97.3 F (36.3 C)-98.7 F (37.1 C)] 97.4 F (36.3 C) (03/27 0803) Pulse Rate:  [74-100] 77 (03/27 0749) Resp:  [10-21] 10 (03/27 0749) BP: (92-104)/(58-69) 100/69 mmHg (03/27 0749) SpO2:  [93 %-99 %] 96 % (03/27 0749)  HEMODYNAMICS:   VENTILATOR SETTINGS:   INTAKE / OUTPUT: Intake/Output     03/26 0701 - 03/27 0700 03/27 0701 - 03/28 0700   P.O. 240    IV Piggyback 50    Total Intake(mL/kg) 290 (6.9)    Urine (mL/kg/hr) 1050 (1)    Total Output 1050     Net -760            PHYSICAL EXAMINATION: General: Resting in no distress HEENT: NCAT, PERRL PULM: CTAB CV: Regular, no murmurs AB: Soft, non tender Ext: No edema Neuro: Non focal, awake  LABS:  CBC  Recent Labs Lab 09/18/13 0324 09/20/13 0300 09/20/13 1847 09/21/13 0236  WBC 10.8* 8.3  --  6.7  HGB 14.9 12.7 13.9 12.0  HCT 43.7 37.8 41.0 35.3*  PLT 351 403*  --  413*   Coag's No results found for this basename: APTT, INR,  in the last 168 hours  BMET  Recent Labs Lab 09/18/13 0324 09/20/13 0300 09/20/13 1847  09/21/13 0236  NA 136* 140 141 141  K 3.6* 2.4* 3.7 3.5*  CL 92* 97  --  97  CO2 21 33*  --  32  BUN 5* 4*  --  5*  CREATININE 0.40* 0.48*  --  0.47*  GLUCOSE 86 118* 89 124*   Electrolytes  Recent Labs Lab 09/14/13 1602 09/14/13 2119  09/18/13 0324 09/20/13 0300 09/21/13 0236  CALCIUM 9.4  --   < > 9.3 8.8 9.0  MG 2.3 2.1  --   --   --   --   < > = values in this interval not displayed. Sepsis Markers No results found for this basename: LATICACIDVEN, PROCALCITON, O2SATVEN,  in the last 168 hours  ABG  Recent Labs Lab 09/17/13 1545  PHART 7.427  PCO2ART 39.3  PO2ART 55.8*   Liver Enzymes  Recent Labs Lab 09/14/13 1602 09/15/13 0235 09/20/13 0300  AST 12 9 10   ALT 5 <5 5  ALKPHOS 166* 134* 106  BILITOT 0.4 0.4 0.3  ALBUMIN 2.9* 2.4* 2.4*   Cardiac Enzymes  Recent Labs Lab 09/15/13 0845 09/17/13 1545 09/17/13 2337  TROPONINI <0.30 <0.30 <0.30   Glucose No results found for this basename: GLUCAP,  in the last 168 hours  IMAGING:  Dg Chest Port 1 View  09/20/2013   CLINICAL DATA:  Left lower lobe atelectasis  EXAM: PORTABLE CHEST - 1 VIEW  COMPARISON:  DG CHEST 1V PORT dated 09/19/2013  FINDINGS: Stable volume loss at the left base. Right lung clear. No pneumothorax. Normal heart size.  IMPRESSION: Stable volume loss at the left base.   Electronically Signed   By: Maryclare BeanArt  Hoss M.D.   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  TR[facility] to assume care 3/28.  I have personally obtained history, examined patient, evaluated and interpreted laboratory and imaging results, reviewed medical records, formulated assessment / plan and placed orders.  Lonia FarberZUBELEVITSKIY, Kenleigh Toback, MD Pulmonary and Critical Care Medicine [facility] Pager: [phone]  09/21/2013, 11:41 AM

## 2013-09-21 NOTE — Progress Notes (Signed)
ANTIBIOTIC CONSULT NOTE - Follow-up  Pharmacy Consult for Vancomycin Indication: epidural abscess  Allergies  Allergen Reactions  . Minocycline Nausea And Vomiting    Patient Measurements: Height: 5\' 3"  (160 cm) Weight: 92 lb 13 oz (42.1 kg) IBW/kg (Calculated) : 52.4  Vital Signs: Temp: 97.4 F (36.3 C) (03/27 0803) Temp src: Oral (03/27 0803) BP: 100/69 mmHg (03/27 0749) Pulse Rate: 77 (03/27 0749) Intake/Output from previous day: 03/26 0701 - 03/27 0700 In: 290 [P.O.:240; IV Piggyback:50] Out: 1050 [Urine:1050] Intake/Output from this shift:    Labs:  Recent Labs  09/20/13 0300 09/20/13 1847 09/21/13 0236  WBC 8.3  --  6.7  HGB 12.7 13.9 12.0  PLT 403*  --  413*  CREATININE 0.48*  --  0.47*   Estimated Creatinine Clearance: 59.6 ml/min (by C-G formula based on Cr of 0.47).  Recent Labs  09/19/13 1320 09/21/13 0711  VANCOTROUGH 10.5 25.0*     Microbiology: Recent Results (from the past 720 hour(s))  CULTURE, BLOOD (ROUTINE X 2)     Status: None   Collection Time    09/17/13  8:55 PM      Result Value Ref Range Status   Specimen Description BLOOD LEFT ARM   Final   Special Requests BOTTLES DRAWN AEROBIC ONLY 10CC   Final   Culture  Setup Time     Final   Value: 09/18/2013 03:10     Performed at [facility]   Culture     Final   Value:        BLOOD CULTURE RECEIVED NO GROWTH TO DATE CULTURE WILL BE HELD FOR 5 DAYS BEFORE ISSUING A FINAL NEGATIVE REPORT     Performed at [facility]   Report Status PENDING   Incomplete  CULTURE, BLOOD (ROUTINE X 2)     Status: None   Collection Time    09/17/13  8:59 PM      Result Value Ref Range Status   Specimen Description BLOOD LEFT HAND   Final   Special Requests BOTTLES DRAWN AEROBIC ONLY 2CC   Final   Culture  Setup Time     Final   Value: 09/18/2013 03:10     Performed at [facility]   Culture     Final   Value:        BLOOD CULTURE RECEIVED NO GROWTH TO DATE CULTURE WILL BE  HELD FOR 5 DAYS BEFORE ISSUING A FINAL NEGATIVE REPORT     Performed at [facility]   Report Status PENDING   Incomplete  SURGICAL PCR SCREEN     Status: Abnormal   Collection Time    09/20/13  3:43 AM      Result Value Ref Range Status   MRSA, PCR NEGATIVE  NEGATIVE Final   Staphylococcus aureus POSITIVE (*) NEGATIVE Final   Comment:            The Xpert SA Assay (FDA     approved for NASAL specimens     in patients over 71 years of age),     is one component of     a comprehensive surveillance     program.  Test performance has     been validated by [employer] for patients greater     than or equal to 63 year old.     It is not intended     to diagnose infection nor to     guide or  monitor treatment.    Assessment: 43 y/o female with a long history of chronic low back pain s/p uncomplicated lumbar discectomy, bipolar disorder, narcotic abuse and suicide attempts was admitted on 3/20 with an intentional trazodone overdose.  3/23 MRI lumbar spine showed likely small epidural abscess L4-5; increased edema of L4-5 verteba worrisome for infection. She continues on IV vancomycin and ceftazidime. She afebrile and WBC is mildly elevated. Renal function is stable. Vancomycin trough today was slightly supratherapeutic at 25.    Goal of Therapy:  Vancomycin trough level 15-20 mcg/ml  Plan: 1. Change vancomycin to 750mg  IV Q12H 2. Continue Ceftaz 2gm IV q8h 3. F/u renal fxn, C&S, clinical status and [facility]  Draycen Leichter, Pharm.D., BCPS Clinical Pharmacist Pager [phone] 09/21/2013 1:17 PM

## 2013-09-21 NOTE — Transfer of Care (Signed)
Immediate Anesthesia Transfer of Care Note  Patient: Rachael BushyRachel L [place]  Procedure(s) Performed: Procedure(s): LUMBAR WOUND re-exploration and disectomy (N/A)  Patient Location: PACU  Anesthesia Type:General  Level of Consciousness: awake and alert   Airway & Oxygen Therapy: Patient Spontanous Breathing and Patient connected to nasal cannula oxygen  Post-op Assessment: Report given to PACU RN and Post -op Vital signs reviewed and stable  Post vital signs: Reviewed and stable  Complications: No apparent anesthesia complications

## 2013-09-21 NOTE — Progress Notes (Signed)
Physician notified: Tyson AliasFeinstein At: [phone]  Regarding: Planned procedure today for wound exportation, K+ 3.4 this AM, no replacement ordered this AM.  Awaiting return response.   Returned Response at: 0950  Order(s): KCl 20mEq oral one time.

## 2013-09-21 NOTE — Clinical Social Work Note (Signed)
Patient will need Psych consult once medically stable- MD aware. Reece LevyJanet Eero Dini, MSW, Theresia MajorsLCSWA [phone]

## 2013-09-21 NOTE — Anesthesia Procedure Notes (Signed)
Procedure Name: Intubation Date/Time: 09/21/2013 4:42 PM Performed by: Gwenyth AllegraADAMI, Andalyn Heckstall Pre-anesthesia Checklist: Patient identified, Timeout performed, Emergency Drugs available, Suction available and Patient being monitored Patient Re-evaluated:Patient Re-evaluated prior to inductionOxygen Delivery Method: Circle system utilized Preoxygenation: Pre-oxygenation with 100% oxygen Intubation Type: IV induction Ventilation: Mask ventilation without difficulty Laryngoscope Size: Mac and 3 Tube size: 7.0 mm Number of attempts: 1 Airway Equipment and Method: Stylet Placement Confirmation: positive ETCO2,  ETT inserted through vocal cords under direct vision and breath sounds checked- equal and bilateral Secured at: 21 cm Tube secured with: Tape Dental Injury: Teeth and Oropharynx as per pre-operative assessment

## 2013-09-21 NOTE — Progress Notes (Signed)
Utilization review completed.  

## 2013-09-22 LAB — BASIC METABOLIC PANEL
BUN: 5 mg/dL — ABNORMAL LOW (ref 6–23)
CHLORIDE: 97 meq/L (ref 96–112)
CO2: 31 meq/L (ref 19–32)
Calcium: 8.4 mg/dL (ref 8.4–10.5)
Creatinine, Ser: 0.48 mg/dL — ABNORMAL LOW (ref 0.50–1.10)
GFR calc Af Amer: >90 mL/min (ref >90)
GFR calc non Af Amer: >90 mL/min (ref >90)
GLUCOSE: 115 mg/dL — AB (ref 70–99)
POTASSIUM: 3.1 meq/L — AB (ref 3.7–5.3)
SODIUM: 139 meq/L (ref 137–147)

## 2013-09-22 LAB — CBC
HCT: 31.8 % — ABNORMAL LOW (ref 36.0–46.0)
HEMOGLOBIN: 10.5 g/dL — AB (ref 12.0–15.0)
MCH: 33.7 pg (ref 26.0–34.0)
MCHC: 33 g/dL (ref 30.0–36.0)
MCV: 101.9 fL — ABNORMAL HIGH (ref 78.0–100.0)
PLATELETS: 396 10*3/uL (ref 150–400)
RBC: 3.12 MIL/uL — AB (ref 3.87–5.11)
RDW: 13.9 % (ref 11.5–15.5)
WBC: 6.7 10*3/uL (ref 4.0–10.5)

## 2013-09-22 MED ORDER — ALBUTEROL SULFATE (2.5 MG/3ML) 0.083% IN NEBU: 2.5000 mg | INHALATION_SOLUTION | RESPIRATORY_TRACT | Status: DC | PRN

## 2013-09-22 MED ORDER — ONDANSETRON HCL 4 MG/2ML IJ SOLN
4.0000 mg | Freq: Four times a day (QID) | INTRAMUSCULAR | Status: DC | PRN
  Administered 2013-09-22 – 2013-09-23 (×2): 4 mg via INTRAVENOUS
  Filled 2013-09-22 (×2): qty 2

## 2013-09-22 MED ORDER — POLYETHYLENE GLYCOL 3350 17 G PO PACK
17.0000 g | PACK | Freq: Two times a day (BID) | ORAL | Status: DC
  Administered 2013-09-22 – 2013-09-23 (×2): 17 g via ORAL
  Filled 2013-09-22 (×3): qty 1

## 2013-09-22 NOTE — Progress Notes (Signed)
Patient ID: Rachael Ramirez, female   DOB: 09/01/1970, 43 y.o.   MRN: [id number] Afeb, vss. Feels much better post op. Need to see if cultures grow. If no growth tomorrow, can probably be discharged home from our stand point.

## 2013-09-22 NOTE — Progress Notes (Signed)
Physical Therapy Treatment Patient Details Name: Rachael Ramirez MRN: [id number] DOB: Aug 15, 1970 Today's Date: 09/22/2013    History of Present Illness 43 y/o female with a long history of chronic low back pain, bipolar disorder, narcotic abuse and suicide attempts was admitted on 3/20 with an intentional trazodone overdose.  PCCM consulted on 3/23 when the patient developed left lower lobe collapse likely due to a mucus plug.    PT Comments    Pt adm with the above dx. Pt demonstrates progression towards functional goals. Pt able to tolerate longer distance with amb and demonstrated improved bed mobility. Pt continues to complain of severe LBP limiting her activity level. Pt to benefit from skilled acute PT to address limitations listed below.   Follow Up Recommendations  Home health PT;Supervision/Assistance - 24 hour     Equipment Recommendations  Rolling walker with 5" wheels    Recommendations for Other Services       Precautions / Restrictions Precautions Precautions: Fall Precaution Comments: generalized weakness due to high level of LBP Restrictions Weight Bearing Restrictions: No    Mobility  Bed Mobility Overal bed mobility: Needs Assistance Bed Mobility: Rolling;Sidelying to Sit;Sit to Supine Rolling: Min guard Sidelying to sit: Min guard Supine to sit: Min guard Sit to supine: Min guard   General bed mobility comments: verbal cues for instruction and hand placement.   Transfers Overall transfer level: Needs assistance Equipment used: Rolling walker (2 wheeled) Transfers: Sit to/from Stand Sit to Stand: Min assist         General transfer comment: verbal cues for hand placement and sequencing. pt demonstrates LE weakness and expresses  LBP during transfer.   Ambulation/Gait Ambulation/Gait assistance: Min assist Ambulation Distance (Feet): 65 Feet Assistive device: Rolling walker (2 wheeled) Gait Pattern/deviations: Step-through pattern;Decreased stride  length;Narrow base of support Gait velocity: slow; guarded for pain   General Gait Details: pt req RW for stability. verbal cues for sequencing and step patterns. pt reports LBP during amb.     Stairs            Wheelchair Mobility    Modified Rankin (Stroke Patients Only)       Balance Overall balance assessment: Needs assistance Sitting-balance support: Bilateral upper extremity supported;Feet supported Sitting balance-Leahy Scale: Fair Sitting balance - Comments: pt able to sit EOB with Bilate UE supprt for 3 min prior to amb   Standing balance support: Bilateral upper extremity supported Standing balance-Leahy Scale: Poor Standing balance comment: pt req RW for stability. pt denies dizziness.                    Cognition Arousal/Alertness: Awake/alert Behavior During Therapy: Flat affect Overall Cognitive Status: History of cognitive impairments - at baseline                      Exercises      General Comments General comments (skin integrity, edema, etc.): pt expresses pain with movements of LEs and trunk motions      Pertinent Vitals/Pain Pt reports LBP with movement and amb.     Home Living                      Prior Function            PT Goals (current goals can now be found in the care plan section) Acute Rehab PT Goals Patient Stated Goal: return home PT Goal Formulation: With patient Time For Goal Achievement:  09/26/13 Potential to Achieve Goals: Good Progress towards PT goals: Progressing toward goals    Frequency  Min 3X/week    PT Plan Current plan remains appropriate    End of Session Equipment Utilized During Treatment: Gait belt Activity Tolerance: Patient limited by pain Patient left: in bed;with call bell/phone within reach;with nursing/sitter in room     Time: 1531-1557 PT Time Calculation (min): 26 min  Charges:                       G Codes:      Laurencia Roma SPT 09/22/2013, 4:16  PM

## 2013-09-22 NOTE — Progress Notes (Signed)
Physical Therapy Treatment Patient Details Name: Rachael Ramirez MRN: [id number] DOB: Mar 27, 1971 Today's Date: 09/22/2013    History of Present Illness 43 y/o female with a long history of chronic low back pain, bipolar disorder, narcotic abuse and suicide attempts was admitted on 3/20 with an intentional trazodone overdose.  PCCM consulted on 3/23 when the patient developed left lower lobe collapse likely due to a mucus plug.    PT Comments    Pt adm with the above dx. Pt demonstrates progression towards functional goals. Pt able to tolerate longer distance with amb and demonstrated improved bed mobility. Pt continues to complain of severe LBP limiting her activity level. Pt to benefit from skilled acute PT to address limitations listed below.   Follow Up Recommendations  Home health PT;Supervision/Assistance - 24 hour     Equipment Recommendations  Rolling walker with 5" wheels    Recommendations for Other Services       Precautions / Restrictions Precautions Precautions: Fall Precaution Comments: generalized weakness due to high level of LBP Restrictions Weight Bearing Restrictions: No    Mobility  Bed Mobility Overal bed mobility: Needs Assistance Bed Mobility: Rolling;Sidelying to Sit;Sit to Supine Rolling: Min guard Sidelying to sit: Min guard Supine to sit: Min guard Sit to supine: Min guard   General bed mobility comments: verbal cues for instruction and hand placement.   Transfers Overall transfer level: Needs assistance Equipment used: Rolling walker (2 wheeled) Transfers: Sit to/from Stand Sit to Stand: Min assist         General transfer comment: verbal cues for hand placement and sequencing. pt demonstrates LE weakness and expresses  LBP during transfer.   Ambulation/Gait Ambulation/Gait assistance: Min assist Ambulation Distance (Feet): 65 Feet Assistive device: Rolling walker (2 wheeled) Gait Pattern/deviations: Step-through pattern;Decreased stride  length;Narrow base of support Gait velocity: slow; guarded for pain   General Gait Details: pt req RW for stability. verbal cues for sequencing and step patterns. pt reports LBP during amb.     Stairs            Wheelchair Mobility    Modified Rankin (Stroke Patients Only)       Balance Overall balance assessment: Needs assistance Sitting-balance support: Bilateral upper extremity supported;Feet supported Sitting balance-Leahy Scale: Fair Sitting balance - Comments: pt able to sit EOB with Bilate UE supprt for 3 min prior to amb   Standing balance support: Bilateral upper extremity supported Standing balance-Leahy Scale: Poor Standing balance comment: pt req RW for stability. pt denies dizziness.                    Cognition Arousal/Alertness: Awake/alert Behavior During Therapy: Flat affect Overall Cognitive Status: History of cognitive impairments - at baseline                      Exercises      General Comments General comments (skin integrity, edema, etc.): pt expresses pain with movements of LEs and trunk motions      Pertinent Vitals/Pain Pt reports LBP with movement and amb.     Home Living                      Prior Function            PT Goals (current goals can now be found in the care plan section) Acute Rehab PT Goals Patient Stated Goal: return home PT Goal Formulation: With patient Time For Goal Achievement:  09/26/13 Potential to Achieve Goals: Good Progress towards PT goals: Progressing toward goals    Frequency  Min 3X/week    PT Plan Current plan remains appropriate    End of Session Equipment Utilized During Treatment: Gait belt Activity Tolerance: Patient limited by pain Patient left: in bed;with call bell/phone within reach;with nursing/sitter in room     Time: 1531-1557 PT Time Calculation (min): 26 min  Charges:                       G Codes:      Kohler,Andrew SPT 09/22/2013, 4:16  PM  Agree with above assessment.  Lewis ShockAshly Evoleht Hovatter, PT, DPT Pager #: [phone] Office #: [phone]

## 2013-09-22 NOTE — Anesthesia Postprocedure Evaluation (Signed)
  Anesthesia Post-op Note  Patient: Rachael Ramirez  Procedure(s) Performed: Procedure(s): LUMBAR WOUND re-exploration and disectomy (N/A)  Patient Location: PACU  Anesthesia Type:General  Level of Consciousness: awake, alert  and oriented  Airway and Oxygen Therapy: Patient Spontanous Breathing  Post-op Pain: mild  Post-op Assessment: Post-op Vital signs reviewed  Post-op Vital Signs: Reviewed  Complications: No apparent anesthesia complications

## 2013-09-22 NOTE — Progress Notes (Addendum)
[facility] Progress Note [place] TEAM 1 - Stepdown/ICU TEAM   Rachael Ramirez WJX:[id number] DOB: [birth year] DOA: 09/14/2013 PCP: [name], MD  Brief narrative: Rachael Ramirez is a 43 y.o. 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 She told her mother she tool 28 tabs of Trazodone. She continued to c/o back pain and an MRI was preformed- results below.  She developed a Left lower lobe collapse due to a mucous plug on (per CT on 3/23) and PCCM was consulted and BiPAP was started- she was taken off of BiPAP on 3/25 It was planned for her to go the OR for back explorattion on 3/26 but due to lovenox injection, it was postponed.  Eventually taken to the OR on 3/27R- thick fluid encountered/ nonpurulent- further diskectomy done- fluid sent for culture -  Transferred to SDU on 3/28  Subjective: Pain is better than before the surgery. She took the trazodone due to severe pain and the feeling that she was not being heard. Per patient Dr Franky Macho was glad he was able to go back and do the surgery and the diskectomy. She is quite happy now.   Assessment/Plan: Principal Problem:   Drug overdose- suicide attempt-  Bipolar disorder, unspecified - recovered and evaluated by psych who feel she need inpatient treatment for bipolar disorder and substance abuse- they recommended Cymbalta 30 mg daily on 3/21 for both psych issues and pain - also on klonipin/ lamictal at home - feels she is better now- no longer suicidal - will d/c sitter  Active Problems:   Chronic low back pain -multiple spinal surgeries; last (06/2013 L4,5 disectomy); exam sensation is intact; able to move extremities, but limited to due to  pain  -An MRI here showed questionable fluid collection lateral to thecal sac and is now s/p I and D and discectomy- still on Vanc and ceftaz- cultures from fluid pending - cont lyrica - will need physical therapy    Protein-calorie malnutrition, severe - on ensure    Acute respiratory failure with hypoxia - due to mucous plugging- stable now   Smoker  - wants to quit- no cravings  Code Status: full code Family Communication: none Disposition Plan: tx to med/surg  Consultants: NS  Procedures: As above  Antibiotics: Antibiotics Given (last 72 hours)   Date/Time Action Medication Dose Rate   09/19/13 1702 Given   vancomycin (VANCOCIN) IVPB 750 mg/150 ml premix 750 mg 150 mL/hr   09/20/13 0000 Given  [med not available]   cefTAZidime (FORTAZ) 2 g in dextrose 5 % 50 mL IVPB 2 g 100 mL/hr   09/20/13 0151 Given  [med not available]   vancomycin (VANCOCIN) IVPB 750 mg/150 ml premix 750 mg 150 mL/hr   09/20/13 0748 Given   cefTAZidime (FORTAZ) 2 g in dextrose 5 % 50 mL IVPB 2 g 100 mL/hr   09/20/13 1051 Given  [med not available.]   vancomycin (VANCOCIN) IVPB 750 mg/150 ml premix 750 mg 150 mL/hr   09/20/13 1515 Given   cefTAZidime (FORTAZ) 2 g in dextrose 5 % 50 mL IVPB 2 g 100 mL/hr   09/20/13 1633 Given   vancomycin (VANCOCIN) IVPB 750 mg/150 ml premix 750 mg 150 mL/hr   09/20/13 2211 Given   cefTAZidime (FORTAZ) 2  g in dextrose 5 % 50 mL IVPB 2 g 100 mL/hr   09/20/13 2344 Given   vancomycin (VANCOCIN) IVPB 750 mg/150 ml premix 750 mg 150 mL/hr   09/21/13 [id number] Given   cefTAZidime (FORTAZ) 2 g in dextrose 5 % 50 mL IVPB 2 g 100 mL/hr   09/21/13 [id number] Given   vancomycin (VANCOCIN) IVPB 750 mg/150 ml premix 750 mg 150 mL/hr   09/21/13 1428 Given   cefTAZidime (FORTAZ) 2 g in dextrose 5 % 50 mL IVPB 2 g 100 mL/hr   09/21/13 2200 Given   vancomycin (VANCOCIN) IVPB 750 mg/150 ml premix 750 mg 150 mL/hr   09/21/13 2257 Given   cefTAZidime (FORTAZ) 2 g in dextrose 5 % 50  mL IVPB 2 g 100 mL/hr   09/22/13 0720 Given   cefTAZidime (FORTAZ) 2 g in dextrose 5 % 50 mL IVPB 2 g 100 mL/hr   09/22/13 0931 Given   vancomycin (VANCOCIN) IVPB 750 mg/150 ml premix 750 mg 150 mL/hr   09/22/13 1400 Given   cefTAZidime (FORTAZ) 2 g in dextrose 5 % 50 mL IVPB 2 g 100 mL/hr       DVT prophylaxis: SCDs  Objective: Filed Weights   09/17/13 2103 09/19/13 0339 09/19/13 2050  Weight: 42.3 kg (93 lb 4.1 oz) 42.2 kg (93 lb 0.6 oz) 42.1 kg (92 lb 13 oz)   Blood pressure 128/81, pulse 87, temperature 97.4 F (36.3 C), temperature source Oral, resp. rate 14, height 5\' 3"  (1.6 m), weight 42.1 kg (92 lb 13 oz), last menstrual period 09/02/2013, SpO2 95.00%.  Intake/Output Summary (Last 24 hours) at 09/22/13 1613 Last data filed at 09/22/13 1435  Gross per 24 hour  Intake   1770 ml  Output   1100 ml  Net    670 ml     Exam: General: No acute respiratory distress Lungs: Clear to auscultation bilaterally without wheezes or crackles Cardiovascular: Regular rate and rhythm without murmur gallop or rub normal S1 and S2 Abdomen: Nontender, nondistended, soft, bowel sounds positive, no rebound, no ascites, no appreciable mass Extremities: No significant cyanosis, clubbing, or edema bilateral lower extremities  Data Reviewed: Basic Metabolic Panel:  Recent Labs Lab 09/18/13 0324 09/20/13 0300 09/20/13 1847 09/21/13 0236 09/22/13 0308  NA 136* 140 141 141 139  K 3.6* 2.4* 3.7 3.5* 3.1*  CL 92* 97  --  97 97  CO2 21 33*  --  32 31  GLUCOSE 86 118* 89 124* 115*  BUN 5* 4*  --  5* 5*  CREATININE 0.40* 0.48*  --  0.47* 0.48*  CALCIUM 9.3 8.8  --  9.0 8.4   Liver Function Tests:  Recent Labs Lab 09/20/13 0300  AST 10  ALT 5  ALKPHOS 106  BILITOT 0.3  PROT 6.2  ALBUMIN 2.4*   No results found for this basename: LIPASE, AMYLASE,  in the last 168 hours No results found for this basename: AMMONIA,  in the last 168 hours CBC:  Recent Labs Lab 09/17/13 1145  09/18/13 0324 09/20/13 0300 09/20/13 1847 09/21/13 0236 09/22/13 0308  WBC 6.9 10.8* 8.3  --  6.7 6.7  NEUTROABS  --   --  5.5  --   --   --   HGB 13.5 14.9 12.7 13.9 12.0 10.5*  HCT 40.9 43.7 37.8 41.0 35.3* 31.8*  MCV 99.0 100.0 101.3*  --  101.1* 101.9*  PLT 411* 351 403*  --  413* 396   Cardiac Enzymes:  Recent  Labs Lab 09/17/13 1545 09/17/13 2337  TROPONINI <0.30 <0.30   BNP (last 3 results) No results found for this basename: PROBNP,  in the last 8760 hours CBG: No results found for this basename: GLUCAP,  in the last 168 hours  Recent Results (from the past 240 hour(s))  CULTURE, BLOOD (ROUTINE X 2)     Status: None   Collection Time    09/17/13  8:55 PM      Result Value Ref Range Status   Specimen Description BLOOD LEFT ARM   Final   Special Requests BOTTLES DRAWN AEROBIC ONLY 10CC   Final   Culture  Setup Time     Final   Value: 09/18/2013 03:10     Performed at [facility]   Culture     Final   Value:        BLOOD CULTURE RECEIVED NO GROWTH TO DATE CULTURE WILL BE HELD FOR 5 DAYS BEFORE ISSUING A FINAL NEGATIVE REPORT     Performed at [facility]   Report Status PENDING   Incomplete  CULTURE, BLOOD (ROUTINE X 2)     Status: None   Collection Time    09/17/13  8:59 PM      Result Value Ref Range Status   Specimen Description BLOOD LEFT HAND   Final   Special Requests BOTTLES DRAWN AEROBIC ONLY 2CC   Final   Culture  Setup Time     Final   Value: 09/18/2013 03:10     Performed at [facility]   Culture     Final   Value:        BLOOD CULTURE RECEIVED NO GROWTH TO DATE CULTURE WILL BE HELD FOR 5 DAYS BEFORE ISSUING A FINAL NEGATIVE REPORT     Performed at [facility]   Report Status PENDING   Incomplete  SURGICAL PCR SCREEN     Status: Abnormal   Collection Time    09/20/13  3:43 AM      Result Value Ref Range Status   MRSA, PCR NEGATIVE  NEGATIVE Final   Staphylococcus aureus POSITIVE (*) NEGATIVE Final   Comment:             The Xpert SA Assay (FDA     approved for NASAL specimens     in patients over 90 years of age),     is one component of     a comprehensive surveillance     program.  Test performance has     been validated by [employer] for patients greater     than or equal to 48 year old.     It is not intended     to diagnose infection nor to     guide or monitor treatment.  GRAM STAIN     Status: None   Collection Time    09/21/13  5:15 PM      Result Value Ref Range Status   Specimen Description TISSUE   Final   Special Requests TISSUE FOR LUMBAR WOUND   Final   Gram Stain     Final   Value: ABUNDANT WBC PRESENT,BOTH PMN AND MONONUCLEAR     NO ORGANISMS SEEN   Report Status 09/21/2013 FINAL   Final  TISSUE CULTURE     Status: None   Collection Time    09/21/13  5:15 PM      Result Value Ref Range Status   Specimen Description  TISSUE   Final   Special Requests TISSUE FOR LUMBAR WOUND   Final   Gram Stain     Final   Value: ABUNDANT WBC PRESENT,BOTH PMN AND MONONUCLEAR     NO ORGANISMS SEEN     Performed at [facility]   Culture     Final   Value: NO GROWTH     Performed at [facility]   Report Status PENDING   Incomplete  ANAEROBIC CULTURE     Status: None   Collection Time    09/21/13  5:15 PM      Result Value Ref Range Status   Specimen Description TISSUE   Final   Special Requests TISSUE FOR LUMBAR WOUND   Final   Gram Stain     Final   Value: ABUNDANT WBC PRESENT,BOTH PMN AND MONONUCLEAR     NO ORGANISMS SEEN     Performed at [facility]   Culture     Final   Value: NO ANAEROBES ISOLATED; CULTURE IN PROGRESS FOR 5 DAYS     Performed at [facility]   Report Status PENDING   Incomplete  GRAM STAIN     Status: None   Collection Time    09/21/13  5:48 PM      Result Value Ref Range Status   Specimen Description WOUND   Final   Special Requests LUMBAR WOUND   Final   Gram Stain     Final   Value: FEW WBC PRESENT,BOTH PMN  AND MONONUCLEAR     NO ORGANISMS SEEN   Report Status 09/21/2013 FINAL   Final  ANAEROBIC CULTURE     Status: None   Collection Time    09/21/13  5:48 PM      Result Value Ref Range Status   Specimen Description WOUND   Final   Special Requests LUMBAR WOUND   Final   Gram Stain     Final   Value: FEW WBC PRESENT,BOTH PMN AND MONONUCLEAR     NO SQUAMOUS EPITHELIAL CELLS SEEN     NO ORGANISMS SEEN     Performed at [facility]   Culture     Final   Value: NO ANAEROBES ISOLATED; CULTURE IN PROGRESS FOR 5 DAYS     Performed at [facility]   Report Status PENDING   Incomplete  WOUND CULTURE     Status: None   Collection Time    09/21/13  5:48 PM      Result Value Ref Range Status   Specimen Description WOUND   Final   Special Requests LUMBAR WOUND   Final   Gram Stain     Final   Value: FEW WBC PRESENT,BOTH PMN AND MONONUCLEAR     NO SQUAMOUS EPITHELIAL CELLS SEEN     NO ORGANISMS SEEN     Performed at [facility]   Culture     Final   Value: NO GROWTH     Performed at [facility]   Report Status PENDING   Incomplete  TISSUE CULTURE     Status: None   Collection Time    09/21/13  5:52 PM      Result Value Ref Range Status   Specimen Description TISSUE BACK   Final   Special Requests MICRO DISK   Final   Gram Stain     Final   Value: NO WBC SEEN     NO ORGANISMS SEEN  Performed at [facility] PENDING   Incomplete   Report Status PENDING   Incomplete  ANAEROBIC CULTURE     Status: None   Collection Time    09/21/13  5:52 PM      Result Value Ref Range Status   Specimen Description TISSUE BACK   Final   Special Requests MICRO DISK   Final   Gram Stain PENDING   Incomplete   Culture     Final   Value: NO ANAEROBES ISOLATED; CULTURE IN PROGRESS FOR 5 DAYS     Performed at [facility]   Report Status PENDING   Incomplete     Studies:  Recent x-ray studies have been reviewed in detail by the Attending  Physician  Scheduled Meds:  Scheduled Meds: . alum & mag hydroxide-simeth  30 mL Oral Once  . amLODipine  10 mg Oral Daily  . cefTAZidime (FORTAZ)  IV  2 g Intravenous 3 times per day  . Chlorhexidine Gluconate Cloth  6 each Topical Daily  . clonazePAM  0.5 mg Oral BID  . feeding supplement (ENSURE COMPLETE)  237 mL Oral TID BM  . lamoTRIgine  100 mg Oral Daily  . nicotine  21 mg Transdermal Daily  . pregabalin  200 mg Oral Daily  . senna-docusate  1 tablet Oral BID  . vancomycin  750 mg Intravenous Q12H   Continuous Infusions:   Time spent on care of this patient: >35 min   Calvert Cantor, MD  [facility] Office  [phone] Pager - Text Page per Loretha Stapler as per below:  On-Call/Text Page:      [email or web address]  If 7PM-7AM, please contact night-coverage [email or web address] 09/22/2013, 4:13 PM   LOS: 8 days

## 2013-09-23 DIAGNOSIS — M549 Dorsalgia, unspecified: Secondary | ICD-10-CM

## 2013-09-23 LAB — CBC
HCT: 33.8 % — ABNORMAL LOW (ref 36.0–46.0)
Hemoglobin: 11.6 g/dL — ABNORMAL LOW (ref 12.0–15.0)
MCH: 34.3 pg — ABNORMAL HIGH (ref 26.0–34.0)
MCHC: 34.3 g/dL (ref 30.0–36.0)
MCV: 100 fL (ref 78.0–100.0)
PLATELETS: 438 10*3/uL — AB (ref 150–400)
RBC: 3.38 MIL/uL — AB (ref 3.87–5.11)
RDW: 13.4 % (ref 11.5–15.5)
WBC: 6.3 10*3/uL (ref 4.0–10.5)

## 2013-09-23 LAB — BASIC METABOLIC PANEL
BUN: 5 mg/dL — ABNORMAL LOW (ref 6–23)
CALCIUM: 8.8 mg/dL (ref 8.4–10.5)
CO2: 35 mEq/L — ABNORMAL HIGH (ref 19–32)
Chloride: 94 mEq/L — ABNORMAL LOW (ref 96–112)
Creatinine, Ser: 0.52 mg/dL (ref 0.50–1.10)
GFR calc Af Amer: >90 mL/min (ref >90)
GFR calc non Af Amer: >90 mL/min (ref >90)
GLUCOSE: 94 mg/dL (ref 70–99)
Potassium: 3.1 mEq/L — ABNORMAL LOW (ref 3.7–5.3)
Sodium: 139 mEq/L (ref 137–147)

## 2013-09-23 LAB — MAGNESIUM: Magnesium: 1.8 mg/dL (ref 1.5–2.5)

## 2013-09-23 MED ORDER — AMLODIPINE BESYLATE 10 MG PO TABS: 10.0000 mg | ORAL_TABLET | Freq: Every day | ORAL | Status: DC

## 2013-09-23 MED ORDER — POLYETHYLENE GLYCOL 3350 17 G PO PACK: 17.0000 g | PACK | Freq: Two times a day (BID) | ORAL | Status: DC

## 2013-09-23 MED ORDER — POTASSIUM CHLORIDE CRYS ER 20 MEQ PO TBCR
40.0000 meq | EXTENDED_RELEASE_TABLET | Freq: Two times a day (BID) | ORAL | Status: DC
  Administered 2013-09-23: 40 meq via ORAL
  Filled 2013-09-23: qty 2

## 2013-09-23 MED ORDER — SENNOSIDES-DOCUSATE SODIUM 8.6-50 MG PO TABS: 1.0000 | ORAL_TABLET | Freq: Two times a day (BID) | ORAL | Status: DC | PRN

## 2013-09-23 NOTE — Progress Notes (Signed)
No issues overnight. Pt reports minimal left leg pain and back pain, significantly improved compared to preop. Otherwise no c/o.  EXAM:  BP 126/79  Pulse 80  Temp(Src) 98.2 F (36.8 C) (Oral)  Resp 15  Ht 5\' 3"  (1.6 m)  Wt 42.1 kg (92 lb 13 oz)  BMI 16.45 kg/m2  SpO2 95%  LMP 09/02/2013  Awake, alert, oriented  Speech fluent, appropriate  CN grossly intact  5/5 BUE/BLE   CULTURES: OR wound cx grew few Staph Aureus Other Cx neg gram stain/cultures  IMPRESSION:  43 y.o. female s/p redo discectomy POD# 2 with improvement in preop symptoms. - OR cultures do not seem to indicate infectious process  PLAN: - Stable for d/c from neurosurgical standpoint

## 2013-09-23 NOTE — Progress Notes (Addendum)
Discharge instructions given to patient and husband with- teach-back, all questions answered. Suicide numbers stressed to patient if she needs someone to talk with.   Follow back precautions from previous back  surgery.  Felt dizzy before discharge, asked to sit for 15 minutes to make sure dizziness goes away. No other complaints.  Feeling better, talking on phone.  Will discharge home with husband.

## 2013-09-23 NOTE — Discharge Summary (Signed)
Physician Discharge Summary  Rachael Ramirez ZOX:[id number] DOB: September 18, 1970 DOA: 09/14/2013  PCP: [name], MD  Admit date: 09/14/2013 Discharge date: 09/23/2013  Time spent: >45 minutes  Recommendations for Outpatient Follow-up:  1. HHPT  Discharge Diagnoses:  Principal Problem:   Drug overdose Active Problems:   Bipolar disorder, unspecified   HNP (herniated nucleus pulposus), lumbar   Acute on Chronic low back pain   History of suicide attempt in the past   Depression   Anxiety   Compression fracture of L1 lumbar vertebra with delayed healing   Lumbar herniated disc   Abnormal EKG   Protein-calorie malnutrition, severe   Acute respiratory failure with hypoxia   Discharge Condition: stable  Diet recommendation: heart healthy  Filed Weights   09/17/13 2103 09/19/13 0339 09/19/13 2050  Weight: 42.3 kg (93 lb 4.1 oz) 42.2 kg (93 lb 0.6 oz) 42.1 kg (92 lb 13 oz)    History of present illness:  Rachael Ramirez is a 43 y.o. 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 She told her mother she tool 28 tabs of Trazodone.  She continued to c/o back pain and an MRI was preformed- results below.  She developed a Left lower lobe collapse due to a mucous plug on (per CT on 3/23) and PCCM was consulted and BiPAP was started- she was taken off of BiPAP on 3/25  It was planned for her to go the OR for back explorattion on 3/26 but due to lovenox injection, it was postponed.  Eventually taken to the OR on 3/27- thick fluid encountered/ nonpurulent- further diskectomy done- fluid sent for culture -  Transferred to SDU on 3/28   Hospital Course:  Subjective:  Pain is better than before the surgery. She  took the trazodone overdose due to severe pain and the feeling that she was not being heard. Per patient Dr Franky Macho was glad he went back in do the exploration as she needed further diskectomy. She is quite happy now with the results thus far with no feeling of depression or suicidal intentions.    Assessment/Plan:  Principal Problem:  Drug overdose- suicide attempt- Bipolar disorder, unspecified  - recovered and evaluated by psych who felt she need inpatient treatment for bipolar disorder and substance abuse- they recommended Cymbalta 30 mg daily on 3/21 for both psych issues and pain however, she states her issues stemmed from uncontrolled pain- at this point is quite content that her pain has significantly improved, therefore, at this time she does not seem to required inpatient treatment - also on klonipin/ lamictal at home which she will continue-   Active Problems:  Acute on Chronic low back pain  -multiple spinal surgeries; last (06/2013 L4,5 disectomy); exam sensation is intact; able to move extremities, but limited to due to pain  -An MRI here showed questionable fluid collection lateral to thecal sac and is now s/p I and D and discectomy- was Vanc and ceftaz- cultures reveal the fluid to be non-infectious (see NS note today) - cont lyrica  - will need HH physical therapy which I have ordered  Protein-calorie malnutrition, severe  - on ensure - can continue at home  Acute respiratory failure with hypoxia  - requiring transfer to the ICU - due to mucous plugging- stable  now   Smoker  - wants to quit- no cravings   Procedures:  3/27- discectomy- I and D  Consultations:  NS  Discharge Exam: Filed Vitals:   09/23/13 1535  BP: 122/82  Pulse: 80  Temp: 98.8 F (37.1 C)  Resp: 14    General: No acute respiratory distress  Lungs: Clear to auscultation bilaterally without wheezes or crackles  Cardiovascular: Regular rate and rhythm without murmur gallop or rub normal S1  and S2  Abdomen: Nontender, nondistended, soft, bowel sounds positive, no rebound, no ascites, no appreciable mass  Extremities: No significant cyanosis, clubbing, or edema bilateral lower extremities   Discharge Instructions  Discharge Orders   Future Appointments Provider Department Dept Phone   02/12/2014 11:00 AM York Spaniel, MD [facility] [phone]   Future Orders Complete By Expires   Face-to-face encounter (required for Medicare/Medicaid patients)  As directed    Comments:     I [facility] certify that this patient is under my care and that I, or a nurse practitioner or physician's assistant working with me, had a face-to-face encounter that meets the physician face-to-face encounter requirements with this patient on 09/23/2013. The encounter with the patient was in whole, or in part for the following medical condition(s) which is the primary reason for home health care (List medical condition): back pain   Questions:     The encounter with the patient was in whole, or in part, for the following medical condition, which is the primary reason for home health care:  back pain   I certify that, based on my findings, the following services are medically necessary home health services:  Physical therapy   My clinical findings support the need for the above services:  Unsafe ambulation due to balance issues   Further, I certify that my clinical findings support that this patient is homebound due to:  Ambulates short distances less than 300 feet   Reason for Medically Necessary Home Health Services:  Therapy- Instruction on Safe use of Assistive Devices for ADLs   Home Health  As directed    Questions:     To provide the following care/treatments:  PT       Medication List    STOP taking these medications       oxyCODONE-acetaminophen 5-325 MG per tablet  Commonly known as:  PERCOCET/ROXICET      TAKE these medications       albuterol-ipratropium 18-103  MCG/ACT inhaler  Commonly known as:  COMBIVENT  Inhale 2 puffs into the lungs every 6 (six) hours as needed for wheezing or shortness of breath. For wheezing     amLODipine 10 MG tablet  Commonly known as:  NORVASC  Take 1 tablet (10 mg total) by mouth daily.     clonazePAM 1 MG tablet  Commonly known as:  KLONOPIN  Take 1 mg by mouth 2 (two) times daily. For anxiety     HYDROcodone-acetaminophen 10-325 MG per tablet  Commonly known as:  NORCO  Take 1-2 tablets by mouth every 4 (four) hours as needed for moderate pain.     lamoTRIgine 100 MG tablet  Commonly known as:  LAMICTAL  Take 100 mg by mouth at bedtime.     minocycline 100 MG capsule  Commonly known as:  MINOCIN,DYNACIN  Take 1 capsule by mouth 2 (two) times daily.     omeprazole 20 MG capsule  Commonly known as:  PRILOSEC  Take 20 mg by mouth daily as needed (  for heartburn).     polyethylene glycol packet  Commonly known as:  MIRALAX / GLYCOLAX  Take 17 g by mouth 2 (two) times daily.     pregabalin 300 MG capsule  Commonly known as:  LYRICA  Take 300 mg by mouth at bedtime.     senna-docusate 8.6-50 MG per tablet  Commonly known as:  Senokot-S  Take 1 tablet by mouth 2 (two) times daily as needed for mild constipation.     traZODone 150 MG tablet  Commonly known as:  DESYREL  Take 150 mg by mouth at bedtime.       Allergies  Allergen Reactions  . Minocycline Nausea And Vomiting      The results of significant diagnostics from this hospitalization (including imaging, microbiology, ancillary and laboratory) are listed below for reference.    Significant Diagnostic Studies: Ct Angio Chest Pe W/cm &/or Wo Cm  09/17/2013   CLINICAL DATA:  Acute shortness of breath and chest pain. Rule out pulmonary embolus.  EXAM: CT ANGIOGRAPHY CHEST WITH CONTRAST  TECHNIQUE: Multidetector CT imaging of the chest was performed using the standard protocol during bolus administration of intravenous contrast. Multiplanar CT  image reconstructions and MIPs were obtained to evaluate the vascular anatomy.  CONTRAST:  OMNIPAQUE IOHEXOL 350 MG/ML SOLN  COMPARISON:  Chest radiograph 09/17/2013  FINDINGS: There is good pulmonary arterial tree opacification. No filling defects are seen to indicate pulmonary emboli. The heart is normal in size and shifted leftward in the thorax. 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 No enlarged mediastinal or hilar lymph nodes are identified. There is no pleural or pericardial effusion.  There is low-density material within the left lower lobe bronchus and within multiple left lower lobe segmental and subsegmental bronchi. There is complete collapse of the left lower lobe. Minimal subpleural opacity in the left upper lobe and lingula may reflect atelectasis. There is a small focus of ground-glass opacity medially in the right upper lobe, which may reflect atelectasis or inflammation/ infection.  The visualized portion the upper abdomen is unremarkable. There is slight posterior wedging of the T5 vertebral body, which appears chronic.  Review of the MIP images confirms the above findings.  IMPRESSION: 1. No evidence of pulmonary emboli. 2. Low-density material within left lower lobe bronchi with complete left lower lobe collapse. Findings may reflect mucus plugging. Radiographic follow-up is recommended to ensure resolution.   Electronically Signed   By: Sebastian Ache   On: 09/17/2013 17:59   Mr Lumbar Spine W Wo Contrast  09/17/2013   CLINICAL DATA:  Severe low back pain.  EXAM: MRI LUMBAR SPINE WITHOUT AND WITH CONTRAST  TECHNIQUE: Multiplanar and multiecho pulse sequences of the lumbar spine were obtained without and with intravenous contrast.  CONTRAST:  8mL MULTIHANCE GADOBENATE DIMEGLUMINE 529 MG/ML IV SOLN  COMPARISON:  08/23/2013  FINDINGS: There has been interval increase in the loculated epidural fluid collection in the left lateral recess at L4-5 now extending more  posteriorly and compressing the left side of the thecal sac. The small fluid collection in left lateral recess with peripheral enhancement persists but is slightly smaller but now fluid from this tracks posteriorly along the left side of the thecal sac and into the laminectomy defect.  There is increased edema and enhancement of the L4 and L5 vertebra. There is no abnormal enhancement of the disc space and a tiny amount of fluid that was in the disc space on the prior study has  resolved. The disc space is not narrowed.  The small midline subcutaneous fluid collection at the operative level has almost resolved.  The remainder of the lumbar spine is unchanged without significant abnormalities.  IMPRESSION: 1. Increased epidural fluid collection in the left lateral recess and along the left side of the thecal sac at L4-5, worrisome for a small epidural abscess. 2. Increased edema and enhancement in the L4 and L5 vertebra which may be discogenic in origin but given the adjacent possible infection, I am concerned that this could represent osteomyelitis. Critical Value/emergent results were called by telephone at the time of interpretation on 09/17/2013 at 8:31 AM to the attending physician, who verbally acknowledged these results.   Electronically Signed   By: Geanie CooleyJim  Maxwell M.D.   On: 09/17/2013 08:30   Dg Lumbar Spine 1 View  09/21/2013   CLINICAL DATA:  Lumbar wound re-exploration.  EXAM: LUMBAR SPINE - 1 VIEW  COMPARISON:  MR L SPINE WO/W CM dated 09/17/2013; MR L SPINE WO/W CM dated 08/23/2013  FINDINGS: Status post L5-S1 posterior lumbar instrumentation with interbody disc material. An additional surgical instrument indicates the posterior elements of L4. No acute lumbar spine fracture deformity nor malalignment. Stable moderate L1 compression fracture.  IMPRESSION: Status post L5-S1 PLIF, additional surgical instrument indicates posterior elements of L4.   Electronically Signed   By: Awilda Metroourtnay  Bloomer   On:  09/21/2013 18:14   Dg Chest Port 1 View  09/20/2013   CLINICAL DATA:  Left lower lobe atelectasis  EXAM: PORTABLE CHEST - 1 VIEW  COMPARISON:  DG CHEST 1V PORT dated 09/19/2013  FINDINGS: Stable volume loss at the left base. Right lung clear. No pneumothorax. Normal heart size.  IMPRESSION: Stable volume loss at the left base.   Electronically Signed   By: Maryclare BeanArt  Hoss M.D.   On: 09/20/2013 08:13   Dg Chest Port 1 View  09/19/2013   CLINICAL DATA:  Follow-up left lower lobe atelectasis  EXAM: PORTABLE CHEST - 1 VIEW  COMPARISON:  09/18/2013  FINDINGS: Cardiomediastinal silhouette is stable. No acute infiltrate. Persistent left lower lobe atelectasis. No pulmonary edema.  IMPRESSION: Persistent left lower lobe atelectasis.  No pulmonary edema.   Electronically Signed   By: Natasha MeadLiviu  Pop M.D.   On: 09/19/2013 11:59   Dg Chest Port 1 View  09/18/2013   CLINICAL DATA:  Left lower lobe collapse.  EXAM: PORTABLE CHEST - 1 VIEW  COMPARISON:  CT scan and chest x-rays dated 09/17/2013 and chest x-ray dated 09/15/2013  FINDINGS: There is persistent collapse and consolidation of the left lower lobe. There is now a tiny left pleural effusion. Heart and other mediastinal structures are shifted to the left. Right lung is clear.  IMPRESSION: Persistent  complete atelectasis of the left lower lobe.   Electronically Signed   By: Geanie CooleyJim  Maxwell M.D.   On: 09/18/2013 07:47   Dg Chest Port 1 View  09/17/2013   CLINICAL DATA:  Shortness breath.  EXAM: PORTABLE CHEST - 1 VIEW  COMPARISON:  09/15/2013.  FINDINGS: Interval development of collapse of the left lower lobe with mediastinal shift to left and elevation left hemidiaphragm. This may represent the presence of a mucous plugs. Follow-up until clearance recommended.  IMPRESSION: Interval development of collapse of the left lower lobe with mediastinal shift to left and elevation left hemidiaphragm. This may represent the presence of a mucous plugs. Follow-up until clearance  recommended.  This is a call report.  [facility](Michael radiology technologist)   Electronically  Signed   By: Bridgett Larsson M.D.   On: 09/17/2013 16:56   Dg Chest Port 1 View  09/15/2013   CLINICAL DATA:  Cough  EXAM: PORTABLE CHEST - 1 VIEW  COMPARISON:  07/11/2013  FINDINGS: Lungs are clear. No pleural effusion or pneumothorax.  The heart is normal in size.  IMPRESSION: No evidence of acute cardiopulmonary disease.   Electronically Signed   By: Charline Bills M.D.   On: 09/15/2013 11:37    Microbiology: Recent Results (from the past 240 hour(s))  CULTURE, BLOOD (ROUTINE X 2)     Status: None   Collection Time    09/17/13  8:55 PM      Result Value Ref Range Status   Specimen Description BLOOD LEFT ARM   Final   Special Requests BOTTLES DRAWN AEROBIC ONLY 10CC   Final   Culture  Setup Time     Final   Value: 09/18/2013 03:10     Performed at [facility]   Culture     Final   Value:        BLOOD CULTURE RECEIVED NO GROWTH TO DATE CULTURE WILL BE HELD FOR 5 DAYS BEFORE ISSUING A FINAL NEGATIVE REPORT     Performed at [facility]   Report Status PENDING   Incomplete  CULTURE, BLOOD (ROUTINE X 2)     Status: None   Collection Time    09/17/13  8:59 PM      Result Value Ref Range Status   Specimen Description BLOOD LEFT HAND   Final   Special Requests BOTTLES DRAWN AEROBIC ONLY 2CC   Final   Culture  Setup Time     Final   Value: 09/18/2013 03:10     Performed at [facility]   Culture     Final   Value:        BLOOD CULTURE RECEIVED NO GROWTH TO DATE CULTURE WILL BE HELD FOR 5 DAYS BEFORE ISSUING A FINAL NEGATIVE REPORT     Performed at [facility]   Report Status PENDING   Incomplete  SURGICAL PCR SCREEN     Status: Abnormal   Collection Time    09/20/13  3:43 AM      Result Value Ref Range Status   MRSA, PCR NEGATIVE  NEGATIVE Final   Staphylococcus aureus POSITIVE (*) NEGATIVE Final   Comment:            The Xpert SA Assay (FDA     approved for  NASAL specimens     in patients over 82 years of age),     is one component of     a comprehensive surveillance     program.  Test performance has     been validated by [employer] for patients greater     than or equal to 75 year old.     It is not intended     to diagnose infection nor to     guide or monitor treatment.  GRAM STAIN     Status: None   Collection Time    09/21/13  5:15 PM      Result Value Ref Range Status   Specimen Description TISSUE   Final   Special Requests TISSUE FOR LUMBAR WOUND   Final   Gram Stain     Final   Value: ABUNDANT WBC PRESENT,BOTH PMN AND MONONUCLEAR     NO ORGANISMS SEEN   Report  Status 09/21/2013 FINAL   Final  TISSUE CULTURE     Status: None   Collection Time    09/21/13  5:15 PM      Result Value Ref Range Status   Specimen Description TISSUE   Final   Special Requests TISSUE FOR LUMBAR WOUND   Final   Gram Stain     Final   Value: ABUNDANT WBC PRESENT,BOTH PMN AND MONONUCLEAR     NO ORGANISMS SEEN     Performed at [facility]   Culture     Final   Value: NO GROWTH 1 DAY     Performed at [facility]   Report Status PENDING   Incomplete  ANAEROBIC CULTURE     Status: None   Collection Time    09/21/13  5:15 PM      Result Value Ref Range Status   Specimen Description TISSUE   Final   Special Requests TISSUE FOR LUMBAR WOUND   Final   Gram Stain     Final   Value: ABUNDANT WBC PRESENT,BOTH PMN AND MONONUCLEAR     NO ORGANISMS SEEN     Performed at [facility]   Culture     Final   Value: NO ANAEROBES ISOLATED; CULTURE IN PROGRESS FOR 5 DAYS     Performed at [facility]   Report Status PENDING   Incomplete  GRAM STAIN     Status: None   Collection Time    09/21/13  5:48 PM      Result Value Ref Range Status   Specimen Description WOUND   Final   Special Requests LUMBAR WOUND   Final   Gram Stain     Final   Value: FEW WBC PRESENT,BOTH PMN AND MONONUCLEAR     NO ORGANISMS SEEN    Report Status 09/21/2013 FINAL   Final  ANAEROBIC CULTURE     Status: None   Collection Time    09/21/13  5:48 PM      Result Value Ref Range Status   Specimen Description WOUND   Final   Special Requests LUMBAR WOUND   Final   Gram Stain     Final   Value: FEW WBC PRESENT,BOTH PMN AND MONONUCLEAR     NO SQUAMOUS EPITHELIAL CELLS SEEN     NO ORGANISMS SEEN     Performed at [facility]   Culture     Final   Value: NO ANAEROBES ISOLATED; CULTURE IN PROGRESS FOR 5 DAYS     Performed at [facility]   Report Status PENDING   Incomplete  WOUND CULTURE     Status: None   Collection Time    09/21/13  5:48 PM      Result Value Ref Range Status   Specimen Description WOUND   Final   Special Requests LUMBAR WOUND   Final   Gram Stain     Final   Value: FEW WBC PRESENT,BOTH PMN AND MONONUCLEAR     NO SQUAMOUS EPITHELIAL CELLS SEEN     NO ORGANISMS SEEN     Performed at [facility]   Culture     Final   Value: FEW STAPHYLOCOCCUS AUREUS     Note: RIFAMPIN AND GENTAMICIN SHOULD NOT BE USED AS SINGLE DRUGS FOR TREATMENT OF STAPH INFECTIONS.     Performed at [facility]   Report Status PENDING   Incomplete  TISSUE CULTURE     Status: None  Collection Time    09/21/13  5:52 PM      Result Value Ref Range Status   Specimen Description TISSUE BACK   Final   Special Requests MICRO DISK   Final   Gram Stain     Final   Value: NO WBC SEEN     NO ORGANISMS SEEN     Performed at [facility]   Culture     Final   Value: NO GROWTH 1 DAY     Performed at [facility]   Report Status PENDING   Incomplete  ANAEROBIC CULTURE     Status: None   Collection Time    09/21/13  5:52 PM      Result Value Ref Range Status   Specimen Description TISSUE BACK   Final   Special Requests MICRO DISK   Final   Gram Stain PENDING   Incomplete   Culture     Final   Value: NO ANAEROBES ISOLATED; CULTURE IN PROGRESS FOR 5 DAYS     Performed at [facility]   Report Status PENDING   Incomplete     Labs: Basic Metabolic Panel:  Recent Labs Lab 09/18/13 0324 09/20/13 0300 09/20/13 1847 09/21/13 0236 09/22/13 0308 09/23/13 0245  NA 136* 140 141 141 139 139  K 3.6* 2.4* 3.7 3.5* 3.1* 3.1*  CL 92* 97  --  97 97 94*  CO2 21 33*  --  32 31 35*  GLUCOSE 86 118* 89 124* 115* 94  BUN 5* 4*  --  5* 5* 5*  CREATININE 0.40* 0.48*  --  0.47* 0.48* 0.52  CALCIUM 9.3 8.8  --  9.0 8.4 8.8  MG  --   --   --   --   --  1.8   Liver Function Tests:  Recent Labs Lab 09/20/13 0300  AST 10  ALT 5  ALKPHOS 106  BILITOT 0.3  PROT 6.2  ALBUMIN 2.4*   No results found for this basename: LIPASE, AMYLASE,  in the last 168 hours No results found for this basename: AMMONIA,  in the last 168 hours CBC:  Recent Labs Lab 09/18/13 0324 09/20/13 0300 09/20/13 1847 09/21/13 0236 09/22/13 0308 09/23/13 0245  WBC 10.8* 8.3  --  6.7 6.7 6.3  NEUTROABS  --  5.5  --   --   --   --   HGB 14.9 12.7 13.9 12.0 10.5* 11.6*  HCT 43.7 37.8 41.0 35.3* 31.8* 33.8*  MCV 100.0 101.3*  --  101.1* 101.9* 100.0  PLT 351 403*  --  413* 396 438*   Cardiac Enzymes:  Recent Labs Lab 09/17/13 1545 09/17/13 2337  TROPONINI <0.30 <0.30   BNP: BNP (last 3 results) No results found for this basename: PROBNP,  in the last 8760 hours CBG: No results found for this basename: GLUCAP,  in the last 168 hours     Signed:  Calvert Cantor, MD  [facility] 09/23/2013, 5:07 PM

## 2013-09-24 LAB — WOUND CULTURE

## 2013-09-24 LAB — CULTURE, BLOOD (ROUTINE X 2)
CULTURE: NO GROWTH
Culture: NO GROWTH

## 2013-09-25 ENCOUNTER — Encounter (HOSPITAL_COMMUNITY): Payer: Self-pay | Admitting: Neurosurgery

## 2013-09-25 LAB — TISSUE CULTURE
CULTURE: NO GROWTH
Culture: NO GROWTH
GRAM STAIN: NONE SEEN

## 2013-09-26 LAB — ANAEROBIC CULTURE: Gram Stain: NONE SEEN

## 2014-01-10 ENCOUNTER — Encounter (HOSPITAL_COMMUNITY): Payer: Self-pay | Admitting: Emergency Medicine

## 2014-01-10 ENCOUNTER — Emergency Department (HOSPITAL_COMMUNITY)
Admission: EM | Admit: 2014-01-10 | Discharge: 2014-01-11 | Disposition: A | Discharge status: Home / Self Care | Payer: Managed Care, Other (non HMO) | Attending: Emergency Medicine | Admitting: Emergency Medicine

## 2014-01-10 DIAGNOSIS — M545 Low back pain, unspecified: Secondary | ICD-10-CM | POA: Insufficient documentation

## 2014-01-10 DIAGNOSIS — K219 Gastro-esophageal reflux disease without esophagitis: Secondary | ICD-10-CM | POA: Insufficient documentation

## 2014-01-10 DIAGNOSIS — Z9889 Other specified postprocedural states: Secondary | ICD-10-CM | POA: Insufficient documentation

## 2014-01-10 DIAGNOSIS — G8929 Other chronic pain: Secondary | ICD-10-CM | POA: Insufficient documentation

## 2014-01-10 DIAGNOSIS — Z79899 Other long term (current) drug therapy: Secondary | ICD-10-CM | POA: Insufficient documentation

## 2014-01-10 DIAGNOSIS — F17200 Nicotine dependence, unspecified, uncomplicated: Secondary | ICD-10-CM | POA: Insufficient documentation

## 2014-01-10 DIAGNOSIS — G4700 Insomnia, unspecified: Secondary | ICD-10-CM | POA: Insufficient documentation

## 2014-01-10 DIAGNOSIS — F411 Generalized anxiety disorder: Secondary | ICD-10-CM | POA: Insufficient documentation

## 2014-01-10 DIAGNOSIS — Z87448 Personal history of other diseases of urinary system: Secondary | ICD-10-CM | POA: Insufficient documentation

## 2014-01-10 DIAGNOSIS — J45909 Unspecified asthma, uncomplicated: Secondary | ICD-10-CM | POA: Insufficient documentation

## 2014-01-10 DIAGNOSIS — R209 Unspecified disturbances of skin sensation: Secondary | ICD-10-CM | POA: Insufficient documentation

## 2014-01-10 DIAGNOSIS — M549 Dorsalgia, unspecified: Secondary | ICD-10-CM

## 2014-01-10 DIAGNOSIS — Z8614 Personal history of Methicillin resistant Staphylococcus aureus infection: Secondary | ICD-10-CM | POA: Insufficient documentation

## 2014-01-10 LAB — BASIC METABOLIC PANEL
Anion gap: 14 (ref 5–15)
BUN: 4 mg/dL — AB (ref 6–23)
CO2: 29 mEq/L (ref 19–32)
CREATININE: 0.56 mg/dL (ref 0.50–1.10)
Calcium: 9.2 mg/dL (ref 8.4–10.5)
Chloride: 101 mEq/L (ref 96–112)
GFR calc Af Amer: >90 mL/min (ref >90)
GLUCOSE: 89 mg/dL (ref 70–99)
POTASSIUM: 3.1 meq/L — AB (ref 3.7–5.3)
Sodium: 144 mEq/L (ref 137–147)

## 2014-01-10 LAB — CBC WITH DIFFERENTIAL/PLATELET
BASOS PCT: 0 % (ref 0–1)
Basophils Absolute: 0 10*3/uL (ref 0.0–0.1)
EOS ABS: 0.1 10*3/uL (ref 0.0–0.7)
Eosinophils Relative: 1 % (ref 0–5)
HEMATOCRIT: 40.4 % (ref 36.0–46.0)
HEMOGLOBIN: 13.5 g/dL (ref 12.0–15.0)
LYMPHS ABS: 2.8 10*3/uL (ref 0.7–4.0)
Lymphocytes Relative: 37 % (ref 12–46)
MCH: 31.9 pg (ref 26.0–34.0)
MCHC: 33.4 g/dL (ref 30.0–36.0)
MCV: 95.5 fL (ref 78.0–100.0)
MONOS PCT: 6 % (ref 3–12)
Monocytes Absolute: 0.5 10*3/uL (ref 0.1–1.0)
NEUTROS PCT: 56 % (ref 43–77)
Neutro Abs: 4.1 10*3/uL (ref 1.7–7.7)
Platelets: 323 10*3/uL (ref 150–400)
RBC: 4.23 MIL/uL (ref 3.87–5.11)
RDW: 14.2 % (ref 11.5–15.5)
WBC: 7.5 10*3/uL (ref 4.0–10.5)

## 2014-01-10 LAB — SEDIMENTATION RATE: Sed Rate: 7 mm/hr (ref 0–22)

## 2014-01-10 MED ORDER — HYDROMORPHONE HCL PF 1 MG/ML IJ SOLN: 1.0000 mg | Freq: Once | INTRAMUSCULAR | Status: DC

## 2014-01-10 MED ORDER — HYDROMORPHONE HCL PF 1 MG/ML IJ SOLN
1.0000 mg | Freq: Once | INTRAMUSCULAR | Status: AC
  Administered 2014-01-10: 1 mg via INTRAMUSCULAR
  Filled 2014-01-10: qty 1

## 2014-01-10 MED ORDER — KETOROLAC TROMETHAMINE 60 MG/2ML IM SOLN
60.0000 mg | Freq: Once | INTRAMUSCULAR | Status: AC
  Administered 2014-01-10: 60 mg via INTRAMUSCULAR
  Filled 2014-01-10: qty 2

## 2014-01-10 NOTE — ED Notes (Signed)
Pt requesting more pain medication. Dr. Wilkie AyeHorton notified.

## 2014-01-10 NOTE — ED Notes (Signed)
PER EMS: pt from home, reporting an increase of her chronic lower back pain for the past 10 days. Hx of chronic back pain and DJD and reporting feet and leg numbness bilaterally, +pedal pulses and sensation. BP-140/90, HR-84, O2-97% RA. A&Ox4. Pt tearful and moaning in pain upon arrival.

## 2014-01-10 NOTE — ED Notes (Signed)
Dr. Horton at bedside. 

## 2014-01-10 NOTE — ED Provider Notes (Addendum)
CSN: [id number]     Arrival date & time 01/10/14  2101 History   First MD Initiated Contact with Patient 01/10/14 2136     Chief Complaint  Patient presents with  . Back Pain     (Consider location/radiation/quality/duration/timing/severity/associated sxs/prior Treatment) HPI  This a 61 are old female with a history of chronic back pain, multiple back surgeries for herniated discs who presents with back pain. Patient reports from home. Patient reports worsening back pain over the last week. She states that she does not currently have any pain medication. Her primary care physician prescribed Vicodin 5/325 up to 8 times daily but the patient states "I did not want that much Tylenol." She states that she talk to her back surgeon and "my primary physician and surgeon can't decide what kind pain medication and he did." She states that she's not had a medication for over a week. She reports pain of her lower back and radiates down her bilateral lower legs. This is similar to the pain that she always has. She states over the last 4 hours she has had tingling in her bilateral feet. She's been able to ambulate.  She denies any difficulty with urination or her bowels. She denies any fevers.  Per chart review, patient's last surgery was in March for when she had a discectomy and was found to have a noninfectious fluid collection. At that time she taken an overdose of sleeping medication a suicide attempt.  [place] controlled substances database reviewed. Patient filled a prescription for hydrocodone/acetaminophen 5 mg/325 mg on June 27 for a total of 240 pills. When I discussed this with the patient, patient states that she fluffs these down the toilet because "I wasn't going to take them."  Past Medical History  Diagnosis Date  . Back pain   . Asthma     uses Combivent daily as needed  . Insomnia     takes Trazodone nightly  . Anxiety     takes Klonopin daily as needed  . PONV (postoperative nausea and  vomiting)   . History of bronchitis   . Cough   . Spinal headache   . Weakness     left leg  . Chronic back pain     HNP and Radiculopathy  . GERD (gastroesophageal reflux disease)     takes Omeprazole daily  . Urinary urgency   . Bipolar 1 disorder     takes Lamictal daily  . Chronic low back pain 08/22/2013  . MRSA (methicillin resistant Staphylococcus aureus)     Postive nasal swab   Past Surgical History  Procedure Laterality Date  . Hand surgery Right 1992    has pins fracture  . Spinal fusion  1996  . Left arm surgery  1975    left elbow fracture  . Redo left arm surgery  2002  . Cervical fusion    . Back surgery      x 4 one with fusion  . Harware removal from back    . Epidural block injection    . Lumbar laminectomy/decompression microdiscectomy Left 07/13/2013    Procedure: Left Lumbar four-five microdiskectomy;  Surgeon: Carmela Hurt, MD;  Location: [facility];  Service: Neurosurgery;  Laterality: Left;  Left Lumbar four-five microdiskectomy  . Lumbar wound debridement N/A 09/21/2013    Procedure: LUMBAR WOUND re-exploration and disectomy;  Surgeon: Carmela Hurt, MD;  Location: [facility];  Service: Neurosurgery;  Laterality: N/A;   Family History  Problem Relation  Age of Onset  . Multiple sclerosis Mother   . COPD Father   . Alcohol abuse Maternal Grandfather   . Alcohol abuse Brother   . Cirrhosis Brother    History  Substance Use Topics  . Smoking status: Current Every Day Smoker -- 1.00 packs/day for 18 years  . Smokeless tobacco: Never Used  . Alcohol Use: No   OB History   Grav Para Term Preterm Abortions TAB SAB Ect Mult Living                 Review of Systems  Constitutional: Negative for fever.  Respiratory: Negative for cough, chest tightness and shortness of breath.   Cardiovascular: Negative for chest pain.  Gastrointestinal: Negative for abdominal pain.  Genitourinary: Negative for dysuria.       Denies urinary retention   Musculoskeletal: Positive for back pain.  Skin: Negative for wound.  Neurological: Positive for numbness. Negative for weakness and headaches.  Psychiatric/Behavioral: Negative for confusion.  All other systems reviewed and are negative.     Allergies  Minocycline  Home Medications   Prior to Admission medications   Medication Sig Start Date End Date Taking? Authorizing Provider  albuterol-ipratropium (COMBIVENT) 18-103 MCG/ACT inhaler Inhale 2 puffs into the lungs every 6 (six) hours as needed for wheezing or shortness of breath. For wheezing   Yes Historical Provider, MD  omeprazole (PRILOSEC) 20 MG capsule Take 20 mg by mouth daily as needed (for heartburn).    Yes Historical Provider, MD  pregabalin (LYRICA) 300 MG capsule Take 300 mg by mouth 2 (two) times daily.   Yes Historical Provider, MD  traZODone (DESYREL) 150 MG tablet Take 300 mg by mouth at bedtime.   Yes Historical Provider, MD  methylPREDNIsolone (MEDROL DOSPACK) 4 MG tablet follow package directions 01/11/14   Shon Batonourtney F Emalea Mix, MD   BP 126/78  Pulse 75  Temp(Src) 98.4 F (36.9 C) (Oral)  Resp 12  Ht 5\' 3"  (1.6 m)  Wt 98 lb (44.453 kg)  BMI 17.36 kg/m2  SpO2 97% Physical Exam  Nursing note and vitals reviewed. Constitutional: She is oriented to person, place, and time. She appears well-developed and well-nourished. No distress.  HENT:  Head: Normocephalic and atraumatic.  Cardiovascular: Normal rate, regular rhythm and normal heart sounds.   No murmur heard. Pulmonary/Chest: Effort normal and breath sounds normal. No respiratory distress. She has no wheezes.  Abdominal: Soft. Bowel sounds are normal. There is no tenderness. There is no rebound.  Musculoskeletal: She exhibits no edema.  No midline lumbar spine tenderness  Neurological: She is alert and oriented to person, place, and time.  Neurologic exam limited secondary to pain, 2 beats of clonus bilaterally, 2+ patellar reflexes, strength intact with  dorsi and plantar flexion, knee extension and flexion, sensation grossly intact  Skin: Skin is warm and dry.  Psychiatric: She has a normal mood and affect.    ED Course  Procedures (including critical care time) Labs Review Labs Reviewed  BASIC METABOLIC PANEL - Abnormal; Notable for the following:    Potassium 3.1 (*)    BUN 4 (*)    All other components within normal limits  CBC WITH DIFFERENTIAL  SEDIMENTATION RATE    Imaging Review No results found.   EKG Interpretation None      MDM   Final diagnoses:  Chronic back pain    Patient presents with back pain. States that this is similar to the pain that she's had in the past. She  also reports bilateral foot numbness which is new.  Objectively, patient is neurologically intact, she has intact strength in bilateral lower extremity. Exam was limited secondary to pain; however, observed the patient spontaneously moving her lower extremities s without difficulty. She also reports that she's been ambulatory.  NO evidence of cauda equina.  Screening lab work to evaluate for leukocytosis and inflammatory markers is unremarkable. Have low suspicion for new fluid collections. Patient has been out of her pain medication. Pain medications provided in ED.  Discussed with the patient that I would be unable to provide her with pain medications at discharge given her recent prescription for 30 day supply which was only given about 18 days ago. Patient was treated for pain in the emergency room and will be given a Medrol Dosepak. At discharge, patient ambulated with assistance.  After history, exam, and medical workup I feel the patient has been appropriately medically screened and is safe for discharge home. Pertinent diagnoses were discussed with the patient. Patient was given return precautions.     Shon Baton, MD 01/11/14 1610  Shon Baton, MD 01/11/14 [phone]

## 2014-01-11 MED ORDER — METHYLPREDNISOLONE (PAK) 4 MG PO TABS: ORAL_TABLET | ORAL | Status: DC

## 2014-01-11 NOTE — Discharge Instructions (Signed)
Chronic Pain Discharge Instructions  °Emergency care providers appreciate that many patients coming to us are in severe pain and we wish to address their pain in the safest, most responsible manner.  It is important to recognize however, that the proper treatment of chronic pain differs from that of the pain of injuries and acute illnesses.  Our goal is to provide quality, safe, personalized care and we thank you for giving us the opportunity to serve you. °The use of narcotics and related agents for chronic pain syndromes may lead to additional physical and psychological problems.  Nearly as many people die from prescription narcotics each year as die from car crashes.  Additionally, this risk is increased if such prescriptions are obtained from a variety of sources.  Therefore, only your primary care physician or a pain management specialist is able to safely treat such syndromes with narcotic medications long-term.   ° °Documentation revealing such prescriptions have been sought from multiple sources may prohibit us from providing a refill or different narcotic medication.  Your name may be checked first through the [place] Controlled Substances Reporting System.  This database is a record of controlled substance medication prescriptions that the patient has received.  This has been established by [facility] in an effort to eliminate the dangerous, and often life threatening, practice of obtaining multiple prescriptions from different medical providers.  ° °If you have a chronic pain syndrome (i.e. chronic headaches, recurrent back or neck pain, dental pain, abdominal or pelvis pain without a specific diagnosis, or neuropathic pain such as fibromyalgia) or recurrent visits for the same condition without an acute diagnosis, you may be treated with non-narcotics and other non-addictive medicines.  Allergic reactions or negative side effects that may be reported by a patient to such medications will not  typically lead to the use of a narcotic analgesic or other controlled substance as an alternative. °  °Patients managing chronic pain with a personal physician should have provisions in place for breakthrough pain.  If you are in crisis, you should call your physician.  If your physician directs you to the emergency department, please have the doctor call and speak to our attending physician concerning your care. °  °When patients come to the Emergency Department (ED) with acute medical conditions in which the Emergency Department physician feels appropriate to prescribe narcotic or sedating pain medication, the physician will prescribe these in very limited quantities.  The amount of these medications will last only until you can see your primary care physician in his/her office.  Any patient who returns to the ED seeking refills should expect only non-narcotic pain medications.  ° °In the event of an acute medical condition exists and the emergency physician feels it is necessary that the patient be given a narcotic or sedating medication -  a responsible adult driver should be present in the room prior to the medication being given by the nurse. °  °Prescriptions for narcotic or sedating medications that have been lost, stolen or expired will not be refilled in the Emergency Department.   ° °Patients who have chronic pain may receive non-narcotic prescriptions until seen by their primary care physician.  It is every patient’s personal responsibility to maintain active prescriptions with his or her primary care physician or specialist. °

## 2014-01-11 NOTE — ED Notes (Signed)
The tech x2 assisted the patient approximately 4 feet. The patients gait was very unsteady and weak. The tech has reported to the RN in charge.

## 2014-01-11 NOTE — ED Notes (Signed)
Pt A&OX4, thanking staff for her care today, NAD, husband at bedside and reports he will be driving her home.

## 2014-02-03 ENCOUNTER — Emergency Department (HOSPITAL_COMMUNITY)
Admission: EM | Admit: 2014-02-03 | Discharge: 2014-02-03 | Disposition: A | Discharge status: Home / Self Care | Payer: Managed Care, Other (non HMO) | Attending: Emergency Medicine | Admitting: Emergency Medicine

## 2014-02-03 ENCOUNTER — Emergency Department (HOSPITAL_COMMUNITY): Payer: Managed Care, Other (non HMO)

## 2014-02-03 ENCOUNTER — Encounter (HOSPITAL_COMMUNITY): Payer: Self-pay | Admitting: Emergency Medicine

## 2014-02-03 DIAGNOSIS — G8929 Other chronic pain: Secondary | ICD-10-CM | POA: Insufficient documentation

## 2014-02-03 DIAGNOSIS — R55 Syncope and collapse: Secondary | ICD-10-CM | POA: Diagnosis not present

## 2014-02-03 DIAGNOSIS — Y9301 Activity, walking, marching and hiking: Secondary | ICD-10-CM | POA: Diagnosis not present

## 2014-02-03 DIAGNOSIS — M545 Low back pain, unspecified: Secondary | ICD-10-CM | POA: Insufficient documentation

## 2014-02-03 DIAGNOSIS — F17200 Nicotine dependence, unspecified, uncomplicated: Secondary | ICD-10-CM | POA: Insufficient documentation

## 2014-02-03 DIAGNOSIS — Z3202 Encounter for pregnancy test, result negative: Secondary | ICD-10-CM | POA: Insufficient documentation

## 2014-02-03 DIAGNOSIS — Z7982 Long term (current) use of aspirin: Secondary | ICD-10-CM | POA: Insufficient documentation

## 2014-02-03 DIAGNOSIS — J449 Chronic obstructive pulmonary disease, unspecified: Secondary | ICD-10-CM | POA: Insufficient documentation

## 2014-02-03 DIAGNOSIS — R296 Repeated falls: Secondary | ICD-10-CM | POA: Diagnosis not present

## 2014-02-03 DIAGNOSIS — Z79899 Other long term (current) drug therapy: Secondary | ICD-10-CM | POA: Diagnosis not present

## 2014-02-03 DIAGNOSIS — Y9229 Other specified public building as the place of occurrence of the external cause: Secondary | ICD-10-CM | POA: Insufficient documentation

## 2014-02-03 DIAGNOSIS — G4700 Insomnia, unspecified: Secondary | ICD-10-CM | POA: Diagnosis not present

## 2014-02-03 DIAGNOSIS — IMO0002 Reserved for concepts with insufficient information to code with codable children: Secondary | ICD-10-CM | POA: Insufficient documentation

## 2014-02-03 DIAGNOSIS — K219 Gastro-esophageal reflux disease without esophagitis: Secondary | ICD-10-CM | POA: Diagnosis not present

## 2014-02-03 DIAGNOSIS — F411 Generalized anxiety disorder: Secondary | ICD-10-CM | POA: Insufficient documentation

## 2014-02-03 DIAGNOSIS — J4489 Other specified chronic obstructive pulmonary disease: Secondary | ICD-10-CM | POA: Insufficient documentation

## 2014-02-03 DIAGNOSIS — Z8614 Personal history of Methicillin resistant Staphylococcus aureus infection: Secondary | ICD-10-CM | POA: Diagnosis not present

## 2014-02-03 LAB — TROPONIN I: Troponin I: <0.3 ng/mL (ref <0.30)

## 2014-02-03 LAB — COMPREHENSIVE METABOLIC PANEL
ALBUMIN: 3.7 g/dL (ref 3.5–5.2)
ALK PHOS: 112 U/L (ref 39–117)
AST: 14 U/L (ref 0–37)
Anion gap: 17 — ABNORMAL HIGH (ref 5–15)
BILIRUBIN TOTAL: 0.2 mg/dL — AB (ref 0.3–1.2)
BUN: 5 mg/dL — ABNORMAL LOW (ref 6–23)
CHLORIDE: 103 meq/L (ref 96–112)
CO2: 23 mEq/L (ref 19–32)
Calcium: 9.1 mg/dL (ref 8.4–10.5)
Creatinine, Ser: 0.67 mg/dL (ref 0.50–1.10)
GFR calc Af Amer: >90 mL/min (ref >90)
GFR calc non Af Amer: >90 mL/min (ref >90)
Glucose, Bld: 99 mg/dL (ref 70–99)
Potassium: 3.3 mEq/L — ABNORMAL LOW (ref 3.7–5.3)
SODIUM: 143 meq/L (ref 137–147)
TOTAL PROTEIN: 7.3 g/dL (ref 6.0–8.3)

## 2014-02-03 LAB — CBC WITH DIFFERENTIAL/PLATELET
BASOS PCT: 0 % (ref 0–1)
Basophils Absolute: 0 10*3/uL (ref 0.0–0.1)
EOS ABS: 0 10*3/uL (ref 0.0–0.7)
Eosinophils Relative: 0 % (ref 0–5)
HCT: 42.8 % (ref 36.0–46.0)
HEMOGLOBIN: 14.3 g/dL (ref 12.0–15.0)
Lymphocytes Relative: 30 % (ref 12–46)
Lymphs Abs: 2.7 10*3/uL (ref 0.7–4.0)
MCH: 32.6 pg (ref 26.0–34.0)
MCHC: 33.4 g/dL (ref 30.0–36.0)
MCV: 97.7 fL (ref 78.0–100.0)
MONOS PCT: 5 % (ref 3–12)
Monocytes Absolute: 0.5 10*3/uL (ref 0.1–1.0)
NEUTROS ABS: 5.7 10*3/uL (ref 1.7–7.7)
NEUTROS PCT: 65 % (ref 43–77)
PLATELETS: 363 10*3/uL (ref 150–400)
RBC: 4.38 MIL/uL (ref 3.87–5.11)
RDW: 14.8 % (ref 11.5–15.5)
WBC: 8.9 10*3/uL (ref 4.0–10.5)

## 2014-02-03 LAB — URINALYSIS, ROUTINE W REFLEX MICROSCOPIC
Bilirubin Urine: NEGATIVE
GLUCOSE, UA: NEGATIVE mg/dL
HGB URINE DIPSTICK: NEGATIVE
Ketones, ur: NEGATIVE mg/dL
Leukocytes, UA: NEGATIVE
Nitrite: NEGATIVE
PH: 7 (ref 5.0–8.0)
Protein, ur: NEGATIVE mg/dL
SPECIFIC GRAVITY, URINE: 1.005 (ref 1.005–1.030)
Urobilinogen, UA: 0.2 mg/dL (ref 0.0–1.0)

## 2014-02-03 LAB — CBG MONITORING, ED: GLUCOSE-CAPILLARY: 104 mg/dL — AB (ref 70–99)

## 2014-02-03 LAB — POC URINE PREG, ED: Preg Test, Ur: NEGATIVE

## 2014-02-03 MED ORDER — HYDROMORPHONE HCL PF 1 MG/ML IJ SOLN
1.0000 mg | Freq: Once | INTRAMUSCULAR | Status: AC
  Administered 2014-02-03: 1 mg via INTRAVENOUS
  Filled 2014-02-03: qty 1

## 2014-02-03 MED ORDER — SODIUM CHLORIDE 0.9 % IV BOLUS (SEPSIS)
500.0000 mL | Freq: Once | INTRAVENOUS | Status: AC
  Administered 2014-02-03: 500 mL via INTRAVENOUS

## 2014-02-03 NOTE — ED Provider Notes (Signed)
CSN: [id number]     Arrival date & time 02/03/14  2003 History   First MD Initiated Contact with Patient 02/03/14 2005     Chief Complaint  Patient presents with  . Fall     (Consider location/radiation/quality/duration/timing/severity/associated sxs/prior Treatment) Patient is a 43 y.o. female presenting with fall. The history is provided by the patient.  Fall This is a new problem. The current episode started less than 1 hour ago. Episode frequency: once. The problem has been resolved. Pertinent negatives include no chest pain, no abdominal pain, no headaches and no shortness of breath. Nothing aggravates the symptoms. Nothing relieves the symptoms. She has tried nothing for the symptoms. The treatment provided no relief.    Past Medical History  Diagnosis Date  . Back pain   . Asthma     uses Combivent daily as needed  . Insomnia     takes Trazodone nightly  . Anxiety     takes Klonopin daily as needed  . PONV (postoperative nausea and vomiting)   . History of bronchitis   . Cough   . Spinal headache   . Weakness     left leg  . Chronic back pain     HNP and Radiculopathy  . GERD (gastroesophageal reflux disease)     takes Omeprazole daily  . Urinary urgency   . Bipolar 1 disorder     takes Lamictal daily  . Chronic low back pain 08/22/2013  . MRSA (methicillin resistant Staphylococcus aureus)     Postive nasal swab   Past Surgical History  Procedure Laterality Date  . Hand surgery Right 1992    has pins fracture  . Spinal fusion  1996  . Left arm surgery  1975    left elbow fracture  . Redo left arm surgery  2002  . Cervical fusion    . Back surgery      x 4 one with fusion  . Harware removal from back    . Epidural block injection    . Lumbar laminectomy/decompression microdiscectomy Left 07/13/2013    Procedure: Left Lumbar four-five microdiskectomy;  Surgeon: Carmela Hurt, MD;  Location: [facility];  Service: Neurosurgery;  Laterality: Left;  Left Lumbar  four-five microdiskectomy  . Lumbar wound debridement N/A 09/21/2013    Procedure: LUMBAR WOUND re-exploration and disectomy;  Surgeon: Carmela Hurt, MD;  Location: [facility];  Service: Neurosurgery;  Laterality: N/A;   Family History  Problem Relation Age of Onset  . Multiple sclerosis Mother   . COPD Father   . Alcohol abuse Maternal Grandfather   . Alcohol abuse Brother   . Cirrhosis Brother    History  Substance Use Topics  . Smoking status: Current Every Day Smoker -- 1.00 packs/day for 18 years  . Smokeless tobacco: Never Used  . Alcohol Use: No   OB History   Grav Para Term Preterm Abortions TAB SAB Ect Mult Living                 Review of Systems  Constitutional: Negative for fever and fatigue.  HENT: Negative for congestion and drooling.   Eyes: Negative for pain.  Respiratory: Negative for cough and shortness of breath.   Cardiovascular: Negative for chest pain.  Gastrointestinal: Negative for nausea, vomiting, abdominal pain and diarrhea.  Genitourinary: Negative for dysuria and hematuria.  Musculoskeletal: Negative for back pain, gait problem and neck pain.  Skin: Negative for color change.  Neurological: Negative for dizziness and  headaches.  Hematological: Negative for adenopathy.  Psychiatric/Behavioral: Negative for behavioral problems.  All other systems reviewed and are negative.     Allergies  Minocycline  Home Medications   Prior to Admission medications   Medication Sig Start Date End Date Taking? Authorizing Provider  albuterol-ipratropium (COMBIVENT) 18-103 MCG/ACT inhaler Inhale 2 puffs into the lungs every 6 (six) hours as needed for wheezing or shortness of breath. For wheezing   Yes Historical Provider, MD  aspirin-acetaminophen-caffeine (EXCEDRIN MIGRAINE) [phone] MG per tablet Take 2 tablets by mouth every 6 (six) hours as needed for headache.   Yes Historical Provider, MD  HYDROcodone-acetaminophen (NORCO) 10-325 MG per tablet  Take 1 tablet by mouth every 6 (six) hours as needed.   Yes Historical Provider, MD  omeprazole (PRILOSEC) 20 MG capsule Take 20 mg by mouth daily.    Yes Historical Provider, MD  ondansetron (ZOFRAN) 8 MG tablet Take 8 mg by mouth every 6 (six) hours as needed. 01/31/14  Yes Historical Provider, MD  pregabalin (LYRICA) 300 MG capsule Take 300 mg by mouth 2 (two) times daily.   Yes Historical Provider, MD  traZODone (DESYREL) 150 MG tablet Take 300 mg by mouth at bedtime.   Yes Historical Provider, MD  methylPREDNIsolone (MEDROL DOSPACK) 4 MG tablet follow package directions 01/11/14   Shon Baton, MD   BP 142/92  Pulse 73  Temp(Src) 97.7 F (36.5 C) (Oral)  Resp 19  Ht 5\' 2"  (1.575 m)  Wt 99 lb (44.906 kg)  BMI 18.10 kg/m2  SpO2 99% Physical Exam  Nursing note and vitals reviewed. Constitutional: She is oriented to person, place, and time. She appears well-developed and well-nourished.  HENT:  Head: Normocephalic.  Mouth/Throat: Oropharynx is clear and moist. No oropharyngeal exudate.  Mild tenderness to palpation of the vertex of her head.  Eyes: Conjunctivae and EOM are normal. Pupils are equal, round, and reactive to light.  Neck: Normal range of motion. Neck supple.  Cardiovascular: Normal rate, regular rhythm, normal heart sounds and intact distal pulses.  Exam reveals no gallop and no friction rub.   No murmur heard. Pulmonary/Chest: Effort normal and breath sounds normal. No respiratory distress. She has no wheezes.  Abdominal: Soft. Bowel sounds are normal. There is no tenderness. There is no rebound and no guarding.  Musculoskeletal: Normal range of motion. She exhibits no edema and no tenderness.  Normal strength and sensation in her extremities.  Nonfocal diffuse lumbar spinal tenderness palpation.  Mild upper cervical tenderness to palpation.  Neurological: She is alert and oriented to person, place, and time.  alert, oriented x3 speech: normal in context and  clarity memory: intact grossly cranial nerves II-XII: intact motor strength: full proximally and distally no involuntary movements or tremors sensation: intact to light touch diffusely  cerebellar: finger-to-nose and heel-to-shin intact gait: normal forwards gait   Skin: Skin is warm and dry.  Psychiatric: She has a normal mood and affect. Her behavior is normal.    ED Course  Procedures (including critical care time) Labs Review Labs Reviewed  COMPREHENSIVE METABOLIC PANEL - Abnormal; Notable for the following:    Potassium 3.3 (*)    BUN 5 (*)    Total Bilirubin 0.2 (*)    Anion gap 17 (*)    All other components within normal limits  CBG MONITORING, ED - Abnormal; Notable for the following:    Glucose-Capillary 104 (*)    All other components within normal limits  CBC WITH DIFFERENTIAL  TROPONIN  I  URINALYSIS, ROUTINE W REFLEX MICROSCOPIC  POC URINE PREG, ED    Imaging Review Dg Chest 1 View  02/03/2014   CLINICAL DATA:  Syncope.  EXAM: CHEST - 1 VIEW  COMPARISON:  09/20/2013.  FINDINGS: The heart size and mediastinal contours are within normal limits. Both lungs are clear. The visualized skeletal structures are unremarkable. Compared with priors, the aeration is improved with clearing of LEFT base volume loss.  IMPRESSION: No active disease.   Electronically Signed   By: Davonna Belling M.D.   On: 02/03/2014 21:14   Dg Lumbar Spine Complete  02/03/2014   CLINICAL DATA:  Low back pain.  EXAM: LUMBAR SPINE - COMPLETE 4+ VIEW  COMPARISON:  Multiple priors, with 2D lumbar spine of 07/05/2013 compared.  FINDINGS: There has been previous lumbar fusion at L5-S1. Subsequent to this there appears to of and lumbar discectomy at L4-5. There is marked asymmetric loss of interspace height at L4-5 on the LEFT which has developed since January. This may relate to discectomy and adjacent segment disease. No worrisome osseous lesions. Hardware grossly intact.  IMPRESSION: L5-S1 fusion appears  uncomplicated. Asymmetric loss of interspace height at L4-5 on the LEFT.   Electronically Signed   By: Davonna Belling M.D.   On: 02/03/2014 21:17   Ct Head Wo Contrast  02/03/2014   CLINICAL DATA:  Fall. Possible syncopal episode. Headache. Neck pain.  EXAM: CT HEAD WITHOUT CONTRAST  CT CERVICAL SPINE WITHOUT CONTRAST  TECHNIQUE: Multidetector CT imaging of the head and cervical spine was performed following the standard protocol without intravenous contrast. Multiplanar CT image reconstructions of the cervical spine were also generated.  COMPARISON:  02/23/2013  FINDINGS: CT HEAD FINDINGS  No evidence for acute infarction, hemorrhage, mass lesion, hydrocephalus, or extra-axial fluid. No atrophy or white matter disease. Intact calvarium. No acute sinus or mastoid disease.  CT CERVICAL SPINE FINDINGS  There is no visible cervical spine fracture, traumatic subluxation, prevertebral soft tissue swelling, or intraspinal hematoma. Prior cervical fusion at C5-C6 appears solid. Airway midline. No neck masses. No lung apex lesion.  There is moderate atherosclerotic plaque at the LEFT internal carotid artery origin, premature for the patient's age. It is unclear if the patient could have a symptomatic carotid stenosis. Recommend elective carotid Dopplers in the outpatient setting.  IMPRESSION: No skull fracture or intracranial hemorrhage. Similar appearance to priors.  No cervical spine fracture or traumatic subluxation.  LEFT internal carotid artery origin atherosclerosis. Consider outpatient evaluation with carotid Dopplers.   Electronically Signed   By: Davonna Belling M.D.   On: 02/03/2014 21:00   Ct Cervical Spine Wo Contrast  02/03/2014   CLINICAL DATA:  Fall. Possible syncopal episode. Headache. Neck pain.  EXAM: CT HEAD WITHOUT CONTRAST  CT CERVICAL SPINE WITHOUT CONTRAST  TECHNIQUE: Multidetector CT imaging of the head and cervical spine was performed following the standard protocol without intravenous contrast.  Multiplanar CT image reconstructions of the cervical spine were also generated.  COMPARISON:  02/23/2013  FINDINGS: CT HEAD FINDINGS  No evidence for acute infarction, hemorrhage, mass lesion, hydrocephalus, or extra-axial fluid. No atrophy or white matter disease. Intact calvarium. No acute sinus or mastoid disease.  CT CERVICAL SPINE FINDINGS  There is no visible cervical spine fracture, traumatic subluxation, prevertebral soft tissue swelling, or intraspinal hematoma. Prior cervical fusion at C5-C6 appears solid. Airway midline. No neck masses. No lung apex lesion.  There is moderate atherosclerotic plaque at the LEFT internal carotid artery origin, premature for the  patient's age. It is unclear if the patient could have a symptomatic carotid stenosis. Recommend elective carotid Dopplers in the outpatient setting.  IMPRESSION: No skull fracture or intracranial hemorrhage. Similar appearance to priors.  No cervical spine fracture or traumatic subluxation.  LEFT internal carotid artery origin atherosclerosis. Consider outpatient evaluation with carotid Dopplers.   Electronically Signed   By: Davonna BellingJohn  Curnes M.D.   On: 02/03/2014 21:00     EKG Interpretation   Date/Time:  Sunday February 03 2014 21:37:03 EDT Ventricular Rate:  68 PR Interval:  135 QRS Duration: 96 QT Interval:  427 QTC Calculation: 454 R Axis:   65 Text Interpretation:  Sinus rhythm No significant change since last  tracing Confirmed by Verley Pariseau  MD, Amitai Delaughter ([id number]) on 02/03/2014 9:54:00 PM      MDM   Final diagnoses:  Neurocardiogenic syncope  Low back pain without sciatica, unspecified back pain laterality    8:36 PM 43 y.o. female with a history of chronic low back pain presents with a fall which occurred prior to arrival. She states that she had just gotten up from a see a position at church and was walking out of the church when she began feeling dizzy and believe she may have syncopized. She has a mild headache but is unsure  if she hit her head. She is afebrile and vital signs are unremarkable here. She complains of a mild headache as well as low back pain. No obvious injury on my exam. Will perform a syncopal workup and get screening imaging and lab work. Dilaudid for pain control.   10:37 PM: I interpreted/reviewed the labs and/or imaging which were non-contributory.  Repeat ecg w/ normal QT. Notified pt of recommendation for f/u carotid dopplers. Likely neurocardiogenic syncope w/ prodrome after getting up.  I have discussed the diagnosis/risks/treatment options with the patient and family and believe the pt to be eligible for discharge home to follow-up with her pcp. We also discussed returning to the ED immediately if new or worsening sx occur. We discussed the sx which are most concerning (e.g., further syncopal episodes, sob, cp) that necessitate immediate return. Medications administered to the patient during their visit and any new prescriptions provided to the patient are listed below.  Medications given during this visit Medications  sodium chloride 0.9 % bolus 500 mL (0 mLs Intravenous Stopped 02/03/14 2140)  HYDROmorphone (DILAUDID) injection 1 mg (1 mg Intravenous Given 02/03/14 2032)  HYDROmorphone (DILAUDID) injection 1 mg (1 mg Intravenous Given 02/03/14 2148)    New Prescriptions   No medications on file     Purvis SheffieldForrest Dorisann Schwanke, MD 02/03/14 2248

## 2014-02-03 NOTE — ED Notes (Signed)
Pt in via EMS after a fall today while walking out of church, pt got up and walked outside and felt dizzy, doesn't remember fall or if she hit her head, c/o mild headache, c/o lower back pain with history of same- states over the last few days her pain in her back has caused her not to eat and the pain is worse after the fall. Pt alert and oriented. No distress noted, denies other complaints.

## 2014-02-03 NOTE — ED Notes (Signed)
Reported complaints of pain to [place], primary RN and recent dose of dilaudid ineffective.

## 2014-02-03 NOTE — ED Notes (Signed)
Patient's visitors request medication for pain.

## 2014-02-05 ENCOUNTER — Emergency Department (HOSPITAL_COMMUNITY): Payer: Managed Care, Other (non HMO)

## 2014-02-05 ENCOUNTER — Encounter (HOSPITAL_COMMUNITY): Payer: Self-pay | Admitting: Emergency Medicine

## 2014-02-05 ENCOUNTER — Emergency Department (HOSPITAL_COMMUNITY)
Admission: EM | Admit: 2014-02-05 | Discharge: 2014-02-05 | Disposition: A | Discharge status: Home / Self Care | Payer: Managed Care, Other (non HMO) | Attending: Emergency Medicine | Admitting: Emergency Medicine

## 2014-02-05 DIAGNOSIS — F411 Generalized anxiety disorder: Secondary | ICD-10-CM | POA: Diagnosis not present

## 2014-02-05 DIAGNOSIS — G8929 Other chronic pain: Secondary | ICD-10-CM | POA: Diagnosis not present

## 2014-02-05 DIAGNOSIS — M549 Dorsalgia, unspecified: Secondary | ICD-10-CM

## 2014-02-05 DIAGNOSIS — R404 Transient alteration of awareness: Secondary | ICD-10-CM | POA: Diagnosis present

## 2014-02-05 DIAGNOSIS — Z3202 Encounter for pregnancy test, result negative: Secondary | ICD-10-CM | POA: Insufficient documentation

## 2014-02-05 DIAGNOSIS — M545 Low back pain, unspecified: Secondary | ICD-10-CM | POA: Diagnosis not present

## 2014-02-05 DIAGNOSIS — K219 Gastro-esophageal reflux disease without esophagitis: Secondary | ICD-10-CM | POA: Diagnosis not present

## 2014-02-05 DIAGNOSIS — Z8614 Personal history of Methicillin resistant Staphylococcus aureus infection: Secondary | ICD-10-CM | POA: Insufficient documentation

## 2014-02-05 DIAGNOSIS — F17200 Nicotine dependence, unspecified, uncomplicated: Secondary | ICD-10-CM | POA: Diagnosis not present

## 2014-02-05 DIAGNOSIS — J45909 Unspecified asthma, uncomplicated: Secondary | ICD-10-CM | POA: Insufficient documentation

## 2014-02-05 DIAGNOSIS — Z79899 Other long term (current) drug therapy: Secondary | ICD-10-CM | POA: Insufficient documentation

## 2014-02-05 LAB — CBC
HCT: 43.4 % (ref 36.0–46.0)
Hemoglobin: 14.6 g/dL (ref 12.0–15.0)
MCH: 32.2 pg (ref 26.0–34.0)
MCHC: 33.6 g/dL (ref 30.0–36.0)
MCV: 95.6 fL (ref 78.0–100.0)
PLATELETS: 373 10*3/uL (ref 150–400)
RBC: 4.54 MIL/uL (ref 3.87–5.11)
RDW: 14.5 % (ref 11.5–15.5)
WBC: 5.9 10*3/uL (ref 4.0–10.5)

## 2014-02-05 LAB — CBG MONITORING, ED: GLUCOSE-CAPILLARY: 86 mg/dL (ref 70–99)

## 2014-02-05 LAB — POC URINE PREG, ED: PREG TEST UR: NEGATIVE

## 2014-02-05 LAB — I-STAT CHEM 8, ED
BUN: <3 mg/dL — ABNORMAL LOW (ref 6–23)
CALCIUM ION: 1.22 mmol/L (ref 1.12–1.23)
Chloride: 104 mEq/L (ref 96–112)
Creatinine, Ser: 0.8 mg/dL (ref 0.50–1.10)
Glucose, Bld: 86 mg/dL (ref 70–99)
HEMATOCRIT: 49 % — AB (ref 36.0–46.0)
HEMOGLOBIN: 16.7 g/dL — AB (ref 12.0–15.0)
POTASSIUM: 3 meq/L — AB (ref 3.7–5.3)
Sodium: 145 mEq/L (ref 137–147)
TCO2: 27 mmol/L (ref 0–100)

## 2014-02-05 MED ORDER — DIAZEPAM 5 MG/ML IJ SOLN
5.0000 mg | Freq: Once | INTRAMUSCULAR | Status: AC
  Administered 2014-02-05: 5 mg via INTRAVENOUS
  Filled 2014-02-05: qty 2

## 2014-02-05 MED ORDER — HYDROMORPHONE HCL PF 1 MG/ML IJ SOLN
1.0000 mg | Freq: Once | INTRAMUSCULAR | Status: AC
  Administered 2014-02-05: 1 mg via INTRAVENOUS
  Filled 2014-02-05: qty 1

## 2014-02-05 MED ORDER — SODIUM CHLORIDE 0.9 % IV BOLUS (SEPSIS)
1000.0000 mL | Freq: Once | INTRAVENOUS | Status: AC
  Administered 2014-02-05: 1000 mL via INTRAVENOUS

## 2014-02-05 MED ORDER — OXYCODONE-ACETAMINOPHEN 5-325 MG PO TABS
2.0000 | ORAL_TABLET | Freq: Once | ORAL | Status: AC
  Administered 2014-02-05: 2 via ORAL
  Filled 2014-02-05: qty 2

## 2014-02-05 MED ORDER — POTASSIUM CHLORIDE CRYS ER 20 MEQ PO TBCR
20.0000 meq | EXTENDED_RELEASE_TABLET | Freq: Once | ORAL | Status: AC
  Administered 2014-02-05: 20 meq via ORAL
  Filled 2014-02-05: qty 1

## 2014-02-05 NOTE — ED Notes (Signed)
Per EMS Pt at home today on phone with doctors office when pt no longer responding on phone. Doctors office stated hearing loud thump and called 911. Upon EMS arrival shallow breathing, pt alert and responsive, CBG 100.

## 2014-02-05 NOTE — Discharge Instructions (Signed)

## 2014-02-05 NOTE — ED Notes (Signed)
EMS gave 4 mg of morphine at 1527 IV, 2 mg at 1533 IV

## 2014-02-05 NOTE — ED Notes (Signed)
Second bolus bag started.

## 2014-02-05 NOTE — ED Notes (Signed)
Patient requesting to speak to attending. States wants more IV pain medications prior to leaving which resident will not order. Dr. Gwendolyn GrantWalden spoke to patient and reinforced reasons. Patient will get oral meds.

## 2014-02-05 NOTE — ED Notes (Signed)
Julie MD at bedside

## 2014-02-05 NOTE — ED Notes (Signed)
After EMT drew blood patient sts "I feel like I need to pass out" EMT sts looked over and patient was non-responsive. This RN and Gwendolyn Grant MD at bedside. RN sternal rubbed patient with some rigidity but no occular movement. Patient pupils 3mm equal and reactive. Pt slowly became more alert after appx 1 min. Pt able to follow simple commands. After another min patient alert and oriented x4.

## 2014-02-05 NOTE — ED Notes (Signed)
Patient requesting to speak to provider [name] made aware.

## 2014-02-05 NOTE — ED Provider Notes (Signed)
CSN: [id number]     Arrival date & time 02/05/14  1547 History   First MD Initiated Contact with Patient 02/05/14 1600     Chief Complaint  Patient presents with  . Loss of Consciousness   HPI [name] is a 43 yo woman with a PMH of chronic low back pain and bipolar disorder who is presenting after a fall today at her home. Apparently, she had a fall with loss of consciousness while on the phone with her PCP office around 2:00PM today. She states that she was standing, suddenly felt dizzy, then "went black". The next thing she knew, EMS was in her home.   An Environmental health practitioneradministrative assistant at the [facility]'s office confirmed that she had been on the phone with Rachael Ramirez today, then heard her shout "oh god!", then heard and thump. After this, the patient did not respond to her, so, she called EMS.   [facility] with a similar presentation and is scheduled for routine carotid dopplers to investigate ?cardiogenic syncope tomorrow morning at 10:00AM.  Past Medical History  Diagnosis Date  . Back pain   . Asthma     uses Combivent daily as needed  . Insomnia     takes Trazodone nightly  . Anxiety     takes Klonopin daily as needed  . PONV (postoperative nausea and vomiting)   . History of bronchitis   . Cough   . Spinal headache   . Weakness     left leg  . Chronic back pain     HNP and Radiculopathy  . GERD (gastroesophageal reflux disease)     takes Omeprazole daily  . Urinary urgency   . Bipolar 1 disorder     takes Lamictal daily  . Chronic low back pain 08/22/2013  . MRSA (methicillin resistant Staphylococcus aureus)     Postive nasal swab   Past Surgical History  Procedure Laterality Date  . Hand surgery Right 1992    has pins fracture  . Spinal fusion  1996  . Left arm surgery  1975    left elbow fracture  . Redo left arm surgery  2002  . Cervical fusion    . Back surgery      x 4 one with fusion  . Harware removal from back    . Epidural block injection     . Lumbar laminectomy/decompression microdiscectomy Left 07/13/2013    Procedure: Left Lumbar four-five microdiskectomy;  Surgeon: Carmela HurtKyle L Cabbell, MD;  Location: [facility];  Service: Neurosurgery;  Laterality: Left;  Left Lumbar four-five microdiskectomy  . Lumbar wound debridement N/A 09/21/2013    Procedure: LUMBAR WOUND re-exploration and disectomy;  Surgeon: Carmela HurtKyle L Cabbell, MD;  Location: [facility];  Service: Neurosurgery;  Laterality: N/A;   Family History  Problem Relation Age of Onset  . Multiple sclerosis Mother   . COPD Father   . Alcohol abuse Maternal Grandfather   . Alcohol abuse Brother   . Cirrhosis Brother    History  Substance Use Topics  . Smoking status: Current Every Day Smoker -- 1.00 packs/day for 18 years  . Smokeless tobacco: Never Used  . Alcohol Use: No   OB History   Grav Para Term Preterm Abortions TAB SAB Ect Mult Living                 Review of Systems General: no recent illness, fever, chills Skin: no rashes or lesions HEENT: chronic headaches,  none new Cardiac: no chest pain, no palpitations Respiratory: no SOB GI: no changes in BMs Urinary: no changes in urination Msk: no joint pain or muscle weakness Neurologic: occasional numbness/tingling in left LE Endocrine: no temperature intolerance or changes in weight Psychiatric: history of anxiety and bipolar disorder   Allergies  Minocycline  Home Medications   Prior to Admission medications   Medication Sig Start Date End Date Taking? Authorizing Provider  albuterol-ipratropium (COMBIVENT) 18-103 MCG/ACT inhaler Inhale 2 puffs into the lungs every 6 (six) hours as needed for wheezing or shortness of breath. For wheezing    Historical Provider, MD  aspirin-acetaminophen-caffeine (EXCEDRIN MIGRAINE) [phone] MG per tablet Take 2 tablets by mouth every 6 (six) hours as needed for headache.    Historical Provider, MD  HYDROcodone-acetaminophen (NORCO) 10-325 MG per tablet Take 1 tablet by  mouth every 6 (six) hours as needed for moderate pain.     Historical Provider, MD  omeprazole (PRILOSEC) 20 MG capsule Take 20 mg by mouth daily.     Historical Provider, MD  ondansetron (ZOFRAN) 8 MG tablet Take 8 mg by mouth every 6 (six) hours as needed for nausea or vomiting.  01/31/14   Historical Provider, MD  pregabalin (LYRICA) 300 MG capsule Take 300 mg by mouth 2 (two) times daily.    Historical Provider, MD  traZODone (DESYREL) 150 MG tablet Take 300 mg by mouth at bedtime.    Historical Provider, MD   BP 146/102  Pulse 71  Temp(Src) 98.1 F (36.7 C) (Oral)  Resp 20  Ht 5\' 1"  (1.549 m)  Wt 99 lb (44.906 kg)  BMI 18.72 kg/m2  SpO2 98%  LMP 01/09/2014 Physical Exam Appearance: moaning throughout HEENT: AT/[place], PERRL, EOMi, no carotid bruits Heart: RRR, no MRG Lungs: CTAB Abdomen: BS+, diffusely tender, nondistended, no hepatosplenomegaly  Musculoskeletal: diffusely tender (along entire spine, all joints), no joint deformities Extremities: no edema Neurologic: CN II-XII intact, strength diminished throughout due to effort, sensation intact throughout,  Skin: no rashes, no lesions Psychiatric: shaking arms throughout exam, stopped when asked about her tattoos, continued throughout finger-nose-finger. Had an episode of unresponsiveness when Dr. Gwendolyn Grant walked in to examine her, normal pulses, normal rhythm strip throughout, normal vitals throughout, closed eyes, able to move all extremities coming out of the episode   ED Course  Procedures (including critical care time) Labs Review Labs Reviewed  I-STAT CHEM 8, ED - Abnormal; Notable for the following:    Potassium 3.0 (*)    BUN <3 (*)    Hemoglobin 16.7 (*)    HCT 49.0 (*)    All other components within normal limits  CBC  CBG MONITORING, ED  POC URINE PREG, ED    Imaging Review from last ED visit Dg Chest 1 View  02/03/2014   CLINICAL DATA:  Syncope.  EXAM: CHEST - 1 VIEW  COMPARISON:  09/20/2013.  FINDINGS: The  heart size and mediastinal contours are within normal limits. Both lungs are clear. The visualized skeletal structures are unremarkable. Compared with priors, the aeration is improved with clearing of LEFT base volume loss.  IMPRESSION: No active disease.   Electronically Signed   By: Davonna Belling M.D.   On: 02/03/2014 21:14   Dg Lumbar Spine Complete  02/03/2014   CLINICAL DATA:  Low back pain.  EXAM: LUMBAR SPINE - COMPLETE 4+ VIEW  COMPARISON:  Multiple priors, with 2D lumbar spine of 07/05/2013 compared.  FINDINGS: There has been previous lumbar fusion at L5-S1. Subsequent  to this there appears to of and lumbar discectomy at L4-5. There is marked asymmetric loss of interspace height at L4-5 on the LEFT which has developed since January. This may relate to discectomy and adjacent segment disease. No worrisome osseous lesions. Hardware grossly intact.  IMPRESSION: L5-S1 fusion appears uncomplicated. Asymmetric loss of interspace height at L4-5 on the LEFT.   Electronically Signed   By: Davonna Belling M.D.   On: 02/03/2014 21:17   Ct Head Wo Contrast  02/03/2014   CLINICAL DATA:  Fall. Possible syncopal episode. Headache. Neck pain.  EXAM: CT HEAD WITHOUT CONTRAST  CT CERVICAL SPINE WITHOUT CONTRAST  TECHNIQUE: Multidetector CT imaging of the head and cervical spine was performed following the standard protocol without intravenous contrast. Multiplanar CT image reconstructions of the cervical spine were also generated.  COMPARISON:  02/23/2013  FINDINGS: CT HEAD FINDINGS  No evidence for acute infarction, hemorrhage, mass lesion, hydrocephalus, or extra-axial fluid. No atrophy or white matter disease. Intact calvarium. No acute sinus or mastoid disease.  CT CERVICAL SPINE FINDINGS  There is no visible cervical spine fracture, traumatic subluxation, prevertebral soft tissue swelling, or intraspinal hematoma. Prior cervical fusion at C5-C6 appears solid. Airway midline. No neck masses. No lung apex lesion.  There  is moderate atherosclerotic plaque at the LEFT internal carotid artery origin, premature for the patient's age. It is unclear if the patient could have a symptomatic carotid stenosis. Recommend elective carotid Dopplers in the outpatient setting.  IMPRESSION: No skull fracture or intracranial hemorrhage. Similar appearance to priors.  No cervical spine fracture or traumatic subluxation.  LEFT internal carotid artery origin atherosclerosis. Consider outpatient evaluation with carotid Dopplers.   Electronically Signed   By: Davonna Belling M.D.   On: 02/03/2014 21:00   Ct Cervical Spine Wo Contrast  02/03/2014   CLINICAL DATA:  Fall. Possible syncopal episode. Headache. Neck pain.  EXAM: CT HEAD WITHOUT CONTRAST  CT CERVICAL SPINE WITHOUT CONTRAST  TECHNIQUE: Multidetector CT imaging of the head and cervical spine was performed following the standard protocol without intravenous contrast. Multiplanar CT image reconstructions of the cervical spine were also generated.  COMPARISON:  02/23/2013  FINDINGS: CT HEAD FINDINGS  No evidence for acute infarction, hemorrhage, mass lesion, hydrocephalus, or extra-axial fluid. No atrophy or white matter disease. Intact calvarium. No acute sinus or mastoid disease.  CT CERVICAL SPINE FINDINGS  There is no visible cervical spine fracture, traumatic subluxation, prevertebral soft tissue swelling, or intraspinal hematoma. Prior cervical fusion at C5-C6 appears solid. Airway midline. No neck masses. No lung apex lesion.  There is moderate atherosclerotic plaque at the LEFT internal carotid artery origin, premature for the patient's age. It is unclear if the patient could have a symptomatic carotid stenosis. Recommend elective carotid Dopplers in the outpatient setting.  IMPRESSION: No skull fracture or intracranial hemorrhage. Similar appearance to priors.  No cervical spine fracture or traumatic subluxation.  LEFT internal carotid artery origin atherosclerosis. Consider outpatient  evaluation with carotid Dopplers.   Electronically Signed   By: Davonna Belling M.D.   On: 02/03/2014 21:00     EKG Interpretation None      MDM   Final diagnoses:  None    This 43 yo woman with a PMH of bipolar disorder and chronic low back pain is here for assessment of a fall earlier today. She has already been worked up for a similar presentation earlier this month, and is scheduled for routine carotid dopplers tomorrow morning. Her primary complaint now  that she is in the hospital is her chronic lower back pain. This pain was managed with dilaudid and the patient was able to sleep. She felt much better and was discharged home.    Dionne Ano, MD 02/05/14 [phone]

## 2014-02-05 NOTE — ED Notes (Signed)
Patient CBG was 86,Doctor.

## 2014-02-05 NOTE — ED Notes (Signed)
Pain in lumbar and cervical region, pupals round and reactive.

## 2014-02-06 ENCOUNTER — Encounter (HOSPITAL_COMMUNITY): Payer: Self-pay | Admitting: Emergency Medicine

## 2014-02-06 ENCOUNTER — Emergency Department (HOSPITAL_COMMUNITY): Payer: Managed Care, Other (non HMO)

## 2014-02-06 ENCOUNTER — Telehealth (HOSPITAL_COMMUNITY): Payer: Self-pay | Admitting: Unknown Physician Specialty

## 2014-02-06 ENCOUNTER — Ambulatory Visit (HOSPITAL_COMMUNITY)
Admission: RE | Admit: 2014-02-06 | Discharge: 2014-02-06 | Disposition: A | Discharge status: Home / Self Care | Payer: Managed Care, Other (non HMO) | Source: Ambulatory Visit | Attending: Family Medicine | Admitting: Family Medicine

## 2014-02-06 ENCOUNTER — Emergency Department (HOSPITAL_COMMUNITY)
Admission: EM | Admit: 2014-02-06 | Discharge: 2014-02-06 | Disposition: A | Discharge status: Home / Self Care | Payer: Managed Care, Other (non HMO) | Attending: Emergency Medicine | Admitting: Emergency Medicine

## 2014-02-06 ENCOUNTER — Other Ambulatory Visit (HOSPITAL_COMMUNITY): Payer: Self-pay | Admitting: Family Medicine

## 2014-02-06 DIAGNOSIS — F411 Generalized anxiety disorder: Secondary | ICD-10-CM | POA: Insufficient documentation

## 2014-02-06 DIAGNOSIS — M545 Low back pain, unspecified: Secondary | ICD-10-CM | POA: Diagnosis not present

## 2014-02-06 DIAGNOSIS — Z79899 Other long term (current) drug therapy: Secondary | ICD-10-CM | POA: Insufficient documentation

## 2014-02-06 DIAGNOSIS — M549 Dorsalgia, unspecified: Secondary | ICD-10-CM

## 2014-02-06 DIAGNOSIS — F17200 Nicotine dependence, unspecified, uncomplicated: Secondary | ICD-10-CM | POA: Insufficient documentation

## 2014-02-06 DIAGNOSIS — Z3202 Encounter for pregnancy test, result negative: Secondary | ICD-10-CM | POA: Diagnosis not present

## 2014-02-06 DIAGNOSIS — R55 Syncope and collapse: Secondary | ICD-10-CM

## 2014-02-06 DIAGNOSIS — G8929 Other chronic pain: Secondary | ICD-10-CM | POA: Diagnosis not present

## 2014-02-06 DIAGNOSIS — J45909 Unspecified asthma, uncomplicated: Secondary | ICD-10-CM | POA: Diagnosis not present

## 2014-02-06 DIAGNOSIS — Z8614 Personal history of Methicillin resistant Staphylococcus aureus infection: Secondary | ICD-10-CM | POA: Insufficient documentation

## 2014-02-06 DIAGNOSIS — K219 Gastro-esophageal reflux disease without esophagitis: Secondary | ICD-10-CM | POA: Insufficient documentation

## 2014-02-06 LAB — I-STAT CHEM 8, ED
BUN: <3 mg/dL — ABNORMAL LOW (ref 6–23)
CALCIUM ION: 1.21 mmol/L (ref 1.12–1.23)
Chloride: 102 mEq/L (ref 96–112)
Creatinine, Ser: 0.7 mg/dL (ref 0.50–1.10)
GLUCOSE: 98 mg/dL (ref 70–99)
HEMATOCRIT: 41 % (ref 36.0–46.0)
HEMOGLOBIN: 13.9 g/dL (ref 12.0–15.0)
Potassium: 3.1 mEq/L — ABNORMAL LOW (ref 3.7–5.3)
Sodium: 140 mEq/L (ref 137–147)
TCO2: 24 mmol/L (ref 0–100)

## 2014-02-06 LAB — CBG MONITORING, ED: GLUCOSE-CAPILLARY: 101 mg/dL — AB (ref 70–99)

## 2014-02-06 LAB — POC URINE PREG, ED: PREG TEST UR: NEGATIVE

## 2014-02-06 MED ORDER — HYDROMORPHONE HCL PF 1 MG/ML IJ SOLN
1.0000 mg | Freq: Once | INTRAMUSCULAR | Status: AC
  Administered 2014-02-06: 1 mg via INTRAVENOUS
  Filled 2014-02-06: qty 1

## 2014-02-06 MED ORDER — DIAZEPAM 5 MG/ML IJ SOLN
5.0000 mg | Freq: Once | INTRAMUSCULAR | Status: AC
  Administered 2014-02-06: 5 mg via INTRAVENOUS
  Filled 2014-02-06: qty 2

## 2014-02-06 MED ORDER — FENTANYL CITRATE 0.05 MG/ML IJ SOLN
100.0000 ug | Freq: Once | INTRAMUSCULAR | Status: AC
  Administered 2014-02-06: 100 ug via INTRAVENOUS
  Filled 2014-02-06: qty 2

## 2014-02-06 MED ORDER — POTASSIUM CHLORIDE CRYS ER 20 MEQ PO TBCR
40.0000 meq | EXTENDED_RELEASE_TABLET | ORAL | Status: AC
  Administered 2014-02-06: 40 meq via ORAL
  Filled 2014-02-06: qty 2

## 2014-02-06 MED ORDER — ONDANSETRON HCL 4 MG/2ML IJ SOLN
4.0000 mg | Freq: Once | INTRAMUSCULAR | Status: AC
  Administered 2014-02-06: 4 mg via INTRAVENOUS
  Filled 2014-02-06: qty 2

## 2014-02-06 MED ORDER — KETOROLAC TROMETHAMINE 30 MG/ML IJ SOLN
30.0000 mg | Freq: Once | INTRAMUSCULAR | Status: AC
  Administered 2014-02-06: 30 mg via INTRAVENOUS
  Filled 2014-02-06: qty 1

## 2014-02-06 MED ORDER — OXYCODONE HCL 5 MG PO TABS: 5.0000 mg | ORAL_TABLET | ORAL | Status: DC | PRN

## 2014-02-06 NOTE — ED Notes (Signed)
Patient stated wanted to leave and ripped IV from Right AC. Security at bedside.

## 2014-02-06 NOTE — ED Notes (Signed)
Patient stated her husband phone number [phone]. Spoke with husband stated will arrive in 15 minutes.

## 2014-02-06 NOTE — ED Provider Notes (Signed)
I saw and evaluated the patient, reviewed the resident's note and I agree with the findings and plan.   EKG Interpretation None      Patient here with syncopal episode - happened while on the phone with her PCP. Here had episode where she was unresponsive, but will shake, more like a pseudoseizure. Has f/u with PCP in the morning. EKG ok here. Labs ok. Patient stable for discharge, became very angry when we wouldn't give IV narcotics.  Elwin MochaBlair Makela Niehoff, MD 02/06/14 [phone]

## 2014-02-06 NOTE — ED Notes (Signed)
Two family members at bedside. EDP Speaking with patient and family members.

## 2014-02-06 NOTE — Code Documentation (Signed)
  Patient Name: Rachael Ramirez   MRN: [id number]   Date of Birth/ Sex: 03/08/1971 , female      Admission Date: 02/06/2014  Attending Provider: No att. providers found  Primary Diagnosis: <principal problem not specified>   Indication: Pt was in her usual state of health until this AM, when she was noted to be weak. She was at the [facility] to get an outpatient carotid doppler study. She was alert and oriented and came to the desk. She then became drowsy, and the secretary was able to lower her to the floor. She became confused but never lost consciousness or pulse. CPR was not started, and she maintained a pulse.  Supervising resident was Dr. Garald BraverKennerly.   Technical Description:  - CPR performance duration:  No  0  - Was defibrillation or cardioversion used? No   - Was external pacer placed? No  - Was patient intubated pre/post CPR? No   Medications Administered: Y = Yes; Blank = No Amiodarone    Atropine    Calcium    Epinephrine    Lidocaine    Magnesium    Norepinephrine    Phenylephrine    Sodium bicarbonate    Vasopressin     Post CPR evaluation:  - Final Status - Was patient successfully resuscitated ? Transfer to the ED - What is current rhythm? unclear - What is current hemodynamic status? stable  Miscellaneous Information:  - Labs sent, including: None  - Primary team notified?  No  - Family Notified? No  - Additional notes/ transfer status: None       Heywood Ilesushil Patel, MD  02/06/2014, 11:00 AM

## 2014-02-06 NOTE — ED Notes (Signed)
Patient in hospital going to have a test for her carotids. While attempting to ambulated to the bathroom patient developed severe pain left lower back radiating to left lower extremity. Pain 10/10 sharp take patient's breath away and difficulty to speak.

## 2014-02-06 NOTE — Code Documentation (Signed)
Called to heart and vascular waiting room for Code Blue. On arrival patient on ground - warm and dry - arouses - mae x 4 - able to speak stating she has back pain.  Secretary states she came to desk and stated she did not feel well - secretary went to her aid and lowered her to a chair then the floor.  Patient never lost pulse.  Turning from side to side with back pain.  Resps regular and unlabored.  Lifted to stretcher and transported to ED with code blue team - MD's at bedside - handoff to ED doc per code MD and RN Tammy SoursGreg.  No intervention needed.

## 2014-02-06 NOTE — Discharge Instructions (Signed)
SEEK IMMEDIATE MEDICAL ATTENTION IF: New numbness, tingling, weakness, or problem with the use of your arms or legs.  Severe back pain not relieved with medications.  Change in bowel or bladder control.  Increasing pain in any areas of the body (such as chest or abdominal pain).  Shortness of breath, dizziness or fainting.  Nausea (feeling sick to your stomach), vomiting, fever, or sweats.  Back Pain, Adult Back pain is very common. The pain often gets better over time. The cause of back pain is usually not dangerous. Most people can learn to manage their back pain on their own.  HOME CARE   Stay active. Start with short walks on flat ground if you can. Try to walk farther each day.  Do not sit, drive, or stand in one place for more than 30 minutes. Do not stay in bed.  Do not avoid exercise or work. Activity can help your back heal faster.  Be careful when you bend or lift an object. Bend at your knees, keep the object close to you, and do not twist.  Sleep on a firm mattress. Lie on your side, and bend your knees. If you lie on your back, put a pillow under your knees.  Only take medicines as told by your doctor.  Put ice on the injured area.  Put ice in a plastic bag.  Place a towel between your skin and the bag.  Leave the ice on for 15-20 minutes, 03-04 times a day for the first 2 to 3 days. After that, you can switch between ice and heat packs.  Ask your doctor about back exercises or massage.  Avoid feeling anxious or stressed. Find good ways to deal with stress, such as exercise. GET HELP RIGHT AWAY IF:   Your pain does not go away with rest or medicine.  Your pain does not go away in 1 week.  You have new problems.  You do not feel well.  The pain spreads into your legs.  You cannot control when you poop (bowel movement) or pee (urinate).  Your arms or legs feel weak or lose feeling (numbness).  You feel sick to your stomach (nauseous) or throw up  (vomit).  You have belly (abdominal) pain.  You feel like you may pass out (faint). MAKE SURE YOU:   Understand these instructions.  Will watch your condition.  Will get help right away if you are not doing well or get worse. Document Released: 12/01/2007 Document Revised: 09/06/2011 Document Reviewed: 10/16/2013 [facility] Patient Information 2015 [employer] [place]. This information is not intended to replace advice given to you by your health care provider. Make sure you discuss any questions you have with your health care provider.

## 2014-02-06 NOTE — ED Notes (Signed)
Patient continued to move around bed with safety sitter at bedside.

## 2014-02-06 NOTE — ED Notes (Signed)
Patient stated she needs to urinate. Nurse tech assisted with bedpan without incident.

## 2014-02-06 NOTE — ED Notes (Signed)
Spoke with EDP stated to hold second dose of potassium and have patient follow up with Doctor.

## 2014-02-06 NOTE — Progress Notes (Signed)
VASCULAR LAB PRELIMINARY  PRELIMINARY  PRELIMINARY  PRELIMINARY  Carotid duplex completed.    Preliminary report:  Right ICA showed no evidence of significant stenosis. Only minimal intimal wall thickening. Left ICA indicates a 1% to 39% ICA stenosis lower end of range. Bilateral vertebral artery flow is antegrade.  Jovie Swanner, RVS 02/06/2014, 12:41 PM

## 2014-02-06 NOTE — ED Notes (Signed)
PT is unsteady on feet and from previous charge nurse patient is confused and moved closer to desk for observation.  Pt trying to get  Out of the bed and states does not know year and month.  Pt is complaining of severe lower back pain.  Staff saw her throw self back in stretcher.  Husband called and coming to the ED

## 2014-02-06 NOTE — ED Provider Notes (Signed)
CSN: [id number]     Arrival date & time 02/06/14  1059 History   First MD Initiated Contact with Patient 02/06/14 1103     Chief Complaint  Patient presents with  . Extremity Pain  . Back Pain     (Consider location/radiation/quality/duration/timing/severity/associated sxs/prior Treatment) Patient is a 43 y.o. female presenting with back pain. The history is provided by the patient.  Back Pain Location:  Lumbar spine Quality:  Aching Radiates to:  L knee and L foot Pain severity:  Severe Onset quality:  Sudden Duration:  1 hour Timing:  Constant Progression:  Unchanged Chronicity:  Chronic Context: falling   Context: not MCA   Relieved by:  Nothing Worsened by:  Nothing tried Ineffective treatments:  None tried Associated symptoms: no chest pain, no dysuria, no fever and no headaches   Risk factors comment:  Hx of L spine surgery x 6   43 yo F with a chief complaint of low back pain. Patient states his back pain has been going on for quite some time. Patient has had 6 surgeries on her L-spine. Patient states that she was getting up to have her carotid Doppler test today and all of a sudden doesn't remember what else happened. Patient with excruciating back pain currently he feels that she may have fallen onto her back. Patient was observed upstairs with concern for syncopal episode. That is why patient is here for her carotid Doppler study. They called a code and had her sent down to the ED. Patient was seen here yesterday for the same. Patient with unremarkable workup yesterday other than mild hypokalemia. Writhing on bed, states is same as prior back pains.    Past Medical History  Diagnosis Date  . Back pain   . Asthma     uses Combivent daily as needed  . Insomnia     takes Trazodone nightly  . Anxiety     takes Klonopin daily as needed  . PONV (postoperative nausea and vomiting)   . History of bronchitis   . Cough   . Spinal headache   . Weakness     left leg  .  Chronic back pain     HNP and Radiculopathy  . GERD (gastroesophageal reflux disease)     takes Omeprazole daily  . Urinary urgency   . Bipolar 1 disorder     takes Lamictal daily  . Chronic low back pain 08/22/2013  . MRSA (methicillin resistant Staphylococcus aureus)     Postive nasal swab   Past Surgical History  Procedure Laterality Date  . Hand surgery Right 1992    has pins fracture  . Spinal fusion  1996  . Left arm surgery  1975    left elbow fracture  . Redo left arm surgery  2002  . Cervical fusion    . Back surgery      x 4 one with fusion  . Harware removal from back    . Epidural block injection    . Lumbar laminectomy/decompression microdiscectomy Left 07/13/2013    Procedure: Left Lumbar four-five microdiskectomy;  Surgeon: Carmela HurtKyle L Cabbell, MD;  Location: [facility];  Service: Neurosurgery;  Laterality: Left;  Left Lumbar four-five microdiskectomy  . Lumbar wound debridement N/A 09/21/2013    Procedure: LUMBAR WOUND re-exploration and disectomy;  Surgeon: Carmela HurtKyle L Cabbell, MD;  Location: [facility];  Service: Neurosurgery;  Laterality: N/A;   Family History  Problem Relation Age of Onset  . Multiple sclerosis Mother   .  COPD Father   . Alcohol abuse Maternal Grandfather   . Alcohol abuse Brother   . Cirrhosis Brother    History  Substance Use Topics  . Smoking status: Current Every Day Smoker -- 1.00 packs/day for 18 years  . Smokeless tobacco: Never Used  . Alcohol Use: No   OB History   Grav Para Term Preterm Abortions TAB SAB Ect Mult Living                 Review of Systems  Constitutional: Negative for fever and chills.  HENT: Negative for congestion and rhinorrhea.   Eyes: Negative for redness and visual disturbance.  Respiratory: Negative for shortness of breath and wheezing.   Cardiovascular: Negative for chest pain and palpitations.  Gastrointestinal: Negative for nausea and vomiting.  Genitourinary: Negative for dysuria and urgency.   Musculoskeletal: Positive for arthralgias, back pain and myalgias.  Skin: Negative for pallor and wound.  Neurological: Positive for syncope. Negative for dizziness and headaches.      Allergies  Minocycline  Home Medications   Prior to Admission medications   Medication Sig Start Date End Date Taking? Authorizing Provider  albuterol-ipratropium (COMBIVENT) 18-103 MCG/ACT inhaler Inhale 2 puffs into the lungs every 6 (six) hours as needed for wheezing or shortness of breath. For wheezing   Yes Historical Provider, MD  aspirin-acetaminophen-caffeine (EXCEDRIN MIGRAINE) [phone] MG per tablet Take 2 tablets by mouth every 6 (six) hours as needed for headache.   Yes Historical Provider, MD  HYDROcodone-acetaminophen (NORCO) 10-325 MG per tablet Take 1 tablet by mouth every 6 (six) hours as needed for moderate pain.    Yes Historical Provider, MD  omeprazole (PRILOSEC) 20 MG capsule Take 20 mg by mouth daily.    Yes Historical Provider, MD  ondansetron (ZOFRAN) 8 MG tablet Take 8 mg by mouth every 6 (six) hours as needed for vomiting.  01/31/14  Yes Historical Provider, MD  pregabalin (LYRICA) 300 MG capsule Take 300 mg by mouth 2 (two) times daily.   Yes Historical Provider, MD  traZODone (DESYREL) 150 MG tablet Take 300 mg by mouth at bedtime.   Yes Historical Provider, MD   BP 177/95  Pulse 63  Temp(Src) 97.6 F (36.4 C) (Oral)  Resp 21  Ht 5\' 1"  (1.549 m)  Wt 99 lb (44.906 kg)  BMI 18.72 kg/m2  SpO2 99%  LMP 01/09/2014 Physical Exam  Constitutional: She is oriented to person, place, and time. She appears well-developed and well-nourished. She appears distressed.  Patient thrashing around on the bed  HENT:  Head: Normocephalic and atraumatic.  Eyes: EOM are normal. Pupils are equal, round, and reactive to light.  Neck: Normal range of motion. Neck supple.  Cardiovascular: Normal rate and regular rhythm.  Exam reveals no gallop and no friction rub.   No murmur  heard. Pulmonary/Chest: Effort normal. She has no wheezes. She has no rales.  Abdominal: Soft. She exhibits no distension. There is no tenderness. There is no rebound.  Musculoskeletal: She exhibits tenderness. She exhibits no edema.  Pulse motor and sensation intact distally.  Ambulatory.  5/5 strength BLE  Neurological: She is alert and oriented to person, place, and time.  Skin: Skin is warm and dry. She is not diaphoretic.  Psychiatric: She has a normal mood and affect. Her behavior is normal.    ED Course  Procedures (including critical care time) Labs Review Labs Reviewed  CBG MONITORING, ED - Abnormal; Notable for the following:    Glucose-Capillary 101 (*)  All other components within normal limits  I-STAT CHEM 8, ED - Abnormal; Notable for the following:    Potassium 3.1 (*)    BUN <3 (*)    All other components within normal limits  CBG MONITORING, ED  POC URINE PREG, ED    Imaging Review Dg Chest 2 View  02/05/2014   CLINICAL DATA:  LOSS OF CONSCIOUSNESS  EXAM: CHEST  2 VIEW  COMPARISON:  Prior radiograph from 02/04/2014  FINDINGS: The cardiac and mediastinal silhouettes are stable in size and contour, and remain within normal limits.  The lungs are normally inflated. No airspace consolidation, pleural effusion, or pulmonary edema is identified. There is no pneumothorax.  Anterior wedging deformity in in the lumbar spine is stable from prior. No acute osseous abnormality  IMPRESSION: No active cardiopulmonary disease.   Electronically Signed   By: Rise Mu M.D.   On: 02/05/2014 17:27   Dg Lumbar Spine Complete  02/06/2014   CLINICAL DATA:  low back pain post fall  EXAM: LUMBAR SPINE - COMPLETE 4+ VIEW  COMPARISON:  02/03/2014  FINDINGS: Prior interbody fusion at L5-S1 is again seen. The lateral pedicular screws on the left are noted with posterior fixation. Disc space narrowing is noted at L4-5 and stable. Compression deformity at L1 is again noted. No acute  bony abnormality is noted. No hardware failure is seen.  IMPRESSION: Chronic changes and postoperative change. No acute abnormality is noted.   Electronically Signed   By: Alcide Clever M.D.   On: 02/06/2014 12:36     EKG Interpretation   Date/Time:  Wednesday February 06 2014 11:27:49 EDT Ventricular Rate:  79 PR Interval:  157 QRS Duration: 88 QT Interval:  402 QTC Calculation: 461 R Axis:   81 Text Interpretation:  Sinus rhythm No significant change since last  tracing Confirmed by GOLDSTON  MD, SCOTT ([id number]) on 02/06/2014 2:53:50 PM      MDM   Final diagnoses:  Acute back pain    43 yo F with a chief complaint of syncope. Patient being seen here for the same. Patient now chief complaint of low back pain which is chronic. We'll treat the patient's pain here check basic labs. Patient with continued mild hypokalemia. Patient U. pregnant negative. EKG unremarkable. We'll obtain carotid Doppler study from the ED.  Patient with unremarkable doppler study, wants to leave ED and drive home as she feels her pain is under treated.  Explained that she was given sedating medications and her husband was called to give her a ride.  Security to bedside.   Family arrived, feel that patient is at her mental baseline.  Good lower extremity strength as patient ambulating on their own.  Security removed from bedside with no change in mental status, family agrees patient will not drive home.   Patients pain improved with two doses dilaudid, one of fentanyl and 10mg  of valium.  D/c home, follow up with PCP and MRI scheduled tomorrow.   3:59 PM:  I have discussed the diagnosis/risks/treatment options with the patient and family and believe the pt to be eligible for discharge home to follow-up with PCP, neurosurgery. We also discussed returning to the ED immediately if new or worsening sx occur. We discussed the sx which are most concerning (e.g., tingling, loss of bowel, bladder) that necessitate immediate  return. Medications administered to the patient during their visit and any new prescriptions provided to the patient are listed below.  Medications given during this visit Medications  potassium chloride SA (K-DUR,KLOR-CON) CR tablet 40 mEq (not administered)  ondansetron (ZOFRAN) injection 4 mg (not administered)  diazepam (VALIUM) injection 5 mg (5 mg Intravenous Given 02/06/14 1122)  ketorolac (TORADOL) 30 MG/ML injection 30 mg (30 mg Intravenous Given 02/06/14 1119)  HYDROmorphone (DILAUDID) injection 1 mg (1 mg Intravenous Given 02/06/14 1351)  diazepam (VALIUM) injection 5 mg (5 mg Intravenous Given 02/06/14 1423)  fentaNYL (SUBLIMAZE) injection 100 mcg (100 mcg Intravenous Given 02/06/14 1542)    New Prescriptions   No medications on file     Melene Plan, MD 02/06/14 1559

## 2014-02-07 ENCOUNTER — Emergency Department (HOSPITAL_COMMUNITY): Payer: Managed Care, Other (non HMO)

## 2014-02-07 ENCOUNTER — Inpatient Hospital Stay (HOSPITAL_COMMUNITY)
Admission: EM | Admit: 2014-02-07 | Discharge: 2014-02-12 | DRG: 312 | Disposition: A | Discharge status: Home Health | Payer: Managed Care, Other (non HMO) | Attending: Internal Medicine | Admitting: Internal Medicine

## 2014-02-07 ENCOUNTER — Encounter (HOSPITAL_COMMUNITY): Payer: Self-pay | Admitting: Emergency Medicine

## 2014-02-07 DIAGNOSIS — R4189 Other symptoms and signs involving cognitive functions and awareness: Secondary | ICD-10-CM

## 2014-02-07 DIAGNOSIS — I951 Orthostatic hypotension: Secondary | ICD-10-CM | POA: Diagnosis not present

## 2014-02-07 DIAGNOSIS — Z820 Family history of epilepsy and other diseases of the nervous system: Secondary | ICD-10-CM

## 2014-02-07 DIAGNOSIS — M549 Dorsalgia, unspecified: Secondary | ICD-10-CM

## 2014-02-07 DIAGNOSIS — F17200 Nicotine dependence, unspecified, uncomplicated: Secondary | ICD-10-CM | POA: Diagnosis present

## 2014-02-07 DIAGNOSIS — M5126 Other intervertebral disc displacement, lumbar region: Secondary | ICD-10-CM

## 2014-02-07 DIAGNOSIS — F329 Major depressive disorder, single episode, unspecified: Secondary | ICD-10-CM

## 2014-02-07 DIAGNOSIS — Z765 Malingerer [conscious simulation]: Secondary | ICD-10-CM

## 2014-02-07 DIAGNOSIS — R269 Unspecified abnormalities of gait and mobility: Secondary | ICD-10-CM

## 2014-02-07 DIAGNOSIS — E43 Unspecified severe protein-calorie malnutrition: Secondary | ICD-10-CM

## 2014-02-07 DIAGNOSIS — Z888 Allergy status to other drugs, medicaments and biological substances status: Secondary | ICD-10-CM

## 2014-02-07 DIAGNOSIS — K219 Gastro-esophageal reflux disease without esophagitis: Secondary | ICD-10-CM | POA: Diagnosis present

## 2014-02-07 DIAGNOSIS — F319 Bipolar disorder, unspecified: Secondary | ICD-10-CM

## 2014-02-07 DIAGNOSIS — G4700 Insomnia, unspecified: Secondary | ICD-10-CM | POA: Diagnosis present

## 2014-02-07 DIAGNOSIS — Z981 Arthrodesis status: Secondary | ICD-10-CM

## 2014-02-07 DIAGNOSIS — Z791 Long term (current) use of non-steroidal anti-inflammatories (NSAID): Secondary | ICD-10-CM

## 2014-02-07 DIAGNOSIS — F32A Depression, unspecified: Secondary | ICD-10-CM | POA: Diagnosis present

## 2014-02-07 DIAGNOSIS — J45909 Unspecified asthma, uncomplicated: Secondary | ICD-10-CM | POA: Diagnosis present

## 2014-02-07 DIAGNOSIS — S32010G Wedge compression fracture of first lumbar vertebra, subsequent encounter for fracture with delayed healing: Secondary | ICD-10-CM

## 2014-02-07 DIAGNOSIS — M545 Low back pain: Secondary | ICD-10-CM

## 2014-02-07 DIAGNOSIS — J9601 Acute respiratory failure with hypoxia: Secondary | ICD-10-CM

## 2014-02-07 DIAGNOSIS — G8929 Other chronic pain: Secondary | ICD-10-CM

## 2014-02-07 DIAGNOSIS — IMO0002 Reserved for concepts with insufficient information to code with codable children: Secondary | ICD-10-CM | POA: Diagnosis present

## 2014-02-07 DIAGNOSIS — R9431 Abnormal electrocardiogram [ECG] [EKG]: Secondary | ICD-10-CM

## 2014-02-07 DIAGNOSIS — R4182 Altered mental status, unspecified: Secondary | ICD-10-CM | POA: Diagnosis not present

## 2014-02-07 DIAGNOSIS — R092 Respiratory arrest: Secondary | ICD-10-CM

## 2014-02-07 DIAGNOSIS — F419 Anxiety disorder, unspecified: Secondary | ICD-10-CM

## 2014-02-07 DIAGNOSIS — R404 Transient alteration of awareness: Secondary | ICD-10-CM

## 2014-02-07 DIAGNOSIS — Z915 Personal history of self-harm: Secondary | ICD-10-CM

## 2014-02-07 DIAGNOSIS — F411 Generalized anxiety disorder: Secondary | ICD-10-CM | POA: Diagnosis present

## 2014-02-07 DIAGNOSIS — Z9151 Personal history of suicidal behavior: Secondary | ICD-10-CM

## 2014-02-07 HISTORY — DX: Essential (primary) hypertension: I10

## 2014-02-07 HISTORY — DX: Other pulmonary collapse: J98.19

## 2014-02-07 LAB — PREGNANCY, URINE: Preg Test, Ur: NEGATIVE

## 2014-02-07 LAB — COMPREHENSIVE METABOLIC PANEL
ALBUMIN: 3.1 g/dL — AB (ref 3.5–5.2)
ALT: 8 U/L (ref 0–35)
ANION GAP: 13 (ref 5–15)
AST: 23 U/L (ref 0–37)
Alkaline Phosphatase: 87 U/L (ref 39–117)
BILIRUBIN TOTAL: 0.3 mg/dL (ref 0.3–1.2)
BUN: 4 mg/dL — AB (ref 6–23)
CALCIUM: 8.3 mg/dL — AB (ref 8.4–10.5)
CO2: 25 mEq/L (ref 19–32)
Chloride: 106 mEq/L (ref 96–112)
Creatinine, Ser: 0.66 mg/dL (ref 0.50–1.10)
GFR calc Af Amer: >90 mL/min (ref >90)
GFR calc non Af Amer: >90 mL/min (ref >90)
Glucose, Bld: 87 mg/dL (ref 70–99)
Potassium: 3.8 mEq/L (ref 3.7–5.3)
Sodium: 144 mEq/L (ref 137–147)
Total Protein: 6.3 g/dL (ref 6.0–8.3)

## 2014-02-07 LAB — CBC WITH DIFFERENTIAL/PLATELET
BASOS ABS: 0 10*3/uL (ref 0.0–0.1)
BASOS PCT: 0 % (ref 0–1)
Eosinophils Absolute: 0.1 10*3/uL (ref 0.0–0.7)
Eosinophils Relative: 2 % (ref 0–5)
HCT: 40.4 % (ref 36.0–46.0)
Hemoglobin: 13.6 g/dL (ref 12.0–15.0)
Lymphocytes Relative: 40 % (ref 12–46)
Lymphs Abs: 2.8 10*3/uL (ref 0.7–4.0)
MCH: 32.4 pg (ref 26.0–34.0)
MCHC: 33.7 g/dL (ref 30.0–36.0)
MCV: 96.2 fL (ref 78.0–100.0)
MONOS PCT: 5 % (ref 3–12)
Monocytes Absolute: 0.4 10*3/uL (ref 0.1–1.0)
NEUTROS ABS: 3.7 10*3/uL (ref 1.7–7.7)
Neutrophils Relative %: 53 % (ref 43–77)
Platelets: 305 10*3/uL (ref 150–400)
RBC: 4.2 MIL/uL (ref 3.87–5.11)
RDW: 14.4 % (ref 11.5–15.5)
WBC: 7 10*3/uL (ref 4.0–10.5)

## 2014-02-07 LAB — RAPID URINE DRUG SCREEN, HOSP PERFORMED
Amphetamines: NOT DETECTED
BARBITURATES: NOT DETECTED
Benzodiazepines: NOT DETECTED
COCAINE: NOT DETECTED
OPIATES: NOT DETECTED
TETRAHYDROCANNABINOL: NOT DETECTED

## 2014-02-07 LAB — ETHANOL: Alcohol, Ethyl (B): <11 mg/dL (ref 0–11)

## 2014-02-07 LAB — ACETAMINOPHEN LEVEL

## 2014-02-07 LAB — SALICYLATE LEVEL: Salicylate Lvl: <2 mg/dL — ABNORMAL LOW (ref 2.8–20.0)

## 2014-02-07 MED ORDER — ONDANSETRON HCL 4 MG/2ML IJ SOLN
4.0000 mg | Freq: Once | INTRAMUSCULAR | Status: AC
  Administered 2014-02-07: 4 mg via INTRAVENOUS
  Filled 2014-02-07: qty 2

## 2014-02-07 MED ORDER — FENTANYL CITRATE 0.05 MG/ML IJ SOLN
50.0000 ug | Freq: Once | INTRAMUSCULAR | Status: AC
  Administered 2014-02-07: 50 ug via INTRAVENOUS
  Filled 2014-02-07: qty 2

## 2014-02-07 NOTE — ED Notes (Signed)
Family at bedside. 

## 2014-02-07 NOTE — ED Notes (Signed)
Pt found down in front yard by neighbor. Neighbor started CPR for 2 minutes. Pt was found apneic with a pulse by Fire Dept. EMS found pt apneic with pulse of 50. They started rescue breathing. Pt received 1mg  Narcan via IO in left lower leg. Pt became responsive when IO was inserted. Pt denies ETOh or illicit drug use. Pt recently had back surgery and has chronic back pain. Pt alert and oriented x 4, neuro intact.

## 2014-02-07 NOTE — ED Notes (Signed)
MD at bedside. 

## 2014-02-08 ENCOUNTER — Emergency Department (HOSPITAL_COMMUNITY): Payer: Managed Care, Other (non HMO)

## 2014-02-08 ENCOUNTER — Encounter (HOSPITAL_COMMUNITY): Payer: Self-pay

## 2014-02-08 ENCOUNTER — Inpatient Hospital Stay (HOSPITAL_COMMUNITY): Payer: Managed Care, Other (non HMO)

## 2014-02-08 DIAGNOSIS — F411 Generalized anxiety disorder: Secondary | ICD-10-CM

## 2014-02-08 DIAGNOSIS — R269 Unspecified abnormalities of gait and mobility: Secondary | ICD-10-CM | POA: Diagnosis present

## 2014-02-08 DIAGNOSIS — IMO0002 Reserved for concepts with insufficient information to code with codable children: Secondary | ICD-10-CM | POA: Diagnosis present

## 2014-02-08 DIAGNOSIS — Z765 Malingerer [conscious simulation]: Secondary | ICD-10-CM | POA: Diagnosis not present

## 2014-02-08 DIAGNOSIS — J45909 Unspecified asthma, uncomplicated: Secondary | ICD-10-CM | POA: Diagnosis present

## 2014-02-08 DIAGNOSIS — F3289 Other specified depressive episodes: Secondary | ICD-10-CM

## 2014-02-08 DIAGNOSIS — G4700 Insomnia, unspecified: Secondary | ICD-10-CM | POA: Diagnosis present

## 2014-02-08 DIAGNOSIS — Z981 Arthrodesis status: Secondary | ICD-10-CM | POA: Diagnosis not present

## 2014-02-08 DIAGNOSIS — F329 Major depressive disorder, single episode, unspecified: Secondary | ICD-10-CM

## 2014-02-08 DIAGNOSIS — R092 Respiratory arrest: Secondary | ICD-10-CM

## 2014-02-08 DIAGNOSIS — I951 Orthostatic hypotension: Secondary | ICD-10-CM | POA: Diagnosis present

## 2014-02-08 DIAGNOSIS — Z820 Family history of epilepsy and other diseases of the nervous system: Secondary | ICD-10-CM | POA: Diagnosis not present

## 2014-02-08 DIAGNOSIS — F17200 Nicotine dependence, unspecified, uncomplicated: Secondary | ICD-10-CM | POA: Diagnosis present

## 2014-02-08 DIAGNOSIS — R55 Syncope and collapse: Secondary | ICD-10-CM

## 2014-02-08 DIAGNOSIS — Z888 Allergy status to other drugs, medicaments and biological substances status: Secondary | ICD-10-CM | POA: Diagnosis not present

## 2014-02-08 DIAGNOSIS — F319 Bipolar disorder, unspecified: Secondary | ICD-10-CM | POA: Diagnosis present

## 2014-02-08 DIAGNOSIS — K219 Gastro-esophageal reflux disease without esophagitis: Secondary | ICD-10-CM | POA: Diagnosis present

## 2014-02-08 DIAGNOSIS — G8929 Other chronic pain: Secondary | ICD-10-CM | POA: Diagnosis present

## 2014-02-08 DIAGNOSIS — Z791 Long term (current) use of non-steroidal anti-inflammatories (NSAID): Secondary | ICD-10-CM | POA: Diagnosis not present

## 2014-02-08 DIAGNOSIS — R4182 Altered mental status, unspecified: Secondary | ICD-10-CM | POA: Diagnosis present

## 2014-02-08 LAB — GLUCOSE, CAPILLARY: Glucose-Capillary: 94 mg/dL (ref 70–99)

## 2014-02-08 LAB — D-DIMER, QUANTITATIVE (NOT AT ARMC): D DIMER QUANT: 1.08 ug{FEU}/mL — AB (ref 0.00–0.48)

## 2014-02-08 LAB — I-STAT ARTERIAL BLOOD GAS, ED
ACID-BASE EXCESS: 3 mmol/L — AB (ref 0.0–2.0)
Bicarbonate: 28.3 mEq/L — ABNORMAL HIGH (ref 20.0–24.0)
O2 Saturation: 99 %
TCO2: 30 mmol/L (ref 0–100)
pCO2 arterial: 43.2 mmHg (ref 35.0–45.0)
pH, Arterial: 7.425 (ref 7.350–7.450)
pO2, Arterial: 123 mmHg — ABNORMAL HIGH (ref 80.0–100.0)

## 2014-02-08 LAB — MRSA PCR SCREENING: MRSA BY PCR: NEGATIVE

## 2014-02-08 LAB — LACTIC ACID, PLASMA: Lactic Acid, Venous: 1.4 mmol/L (ref 0.5–2.2)

## 2014-02-08 LAB — TROPONIN I: Troponin I: <0.3 ng/mL (ref <0.30)

## 2014-02-08 MED ORDER — HEPARIN SODIUM (PORCINE) 5000 UNIT/ML IJ SOLN: 5000.0000 [IU] | Freq: Three times a day (TID) | INTRAMUSCULAR | Status: DC

## 2014-02-08 MED ORDER — TRAZODONE HCL 150 MG PO TABS
300.0000 mg | ORAL_TABLET | Freq: Every day | ORAL | Status: DC
  Administered 2014-02-08 – 2014-02-09 (×2): 300 mg via ORAL
  Filled 2014-02-08 (×4): qty 2

## 2014-02-08 MED ORDER — ONDANSETRON HCL 4 MG/2ML IJ SOLN
4.0000 mg | Freq: Four times a day (QID) | INTRAMUSCULAR | Status: DC | PRN
Start: 2014-02-08 — End: 2014-02-12
  Administered 2014-02-08 – 2014-02-11 (×10): 4 mg via INTRAVENOUS
  Filled 2014-02-08 (×10): qty 2

## 2014-02-08 MED ORDER — HYDROMORPHONE HCL PF 1 MG/ML IJ SOLN
0.5000 mg | INTRAMUSCULAR | Status: DC | PRN
  Administered 2014-02-08 – 2014-02-12 (×21): 0.5 mg via INTRAVENOUS
  Filled 2014-02-08 (×23): qty 1

## 2014-02-08 MED ORDER — ENSURE COMPLETE PO LIQD
237.0000 mL | Freq: Three times a day (TID) | ORAL | Status: DC
  Administered 2014-02-08 (×3): 237 mL via ORAL

## 2014-02-08 MED ORDER — LORAZEPAM 2 MG/ML IJ SOLN
1.0000 mg | INTRAMUSCULAR | Status: AC
  Administered 2014-02-09: 1 mg via INTRAVENOUS
  Filled 2014-02-08: qty 1

## 2014-02-08 MED ORDER — ONDANSETRON HCL 4 MG PO TABS
8.0000 mg | ORAL_TABLET | Freq: Four times a day (QID) | ORAL | Status: DC | PRN
  Administered 2014-02-11: 8 mg via ORAL
  Filled 2014-02-08: qty 2

## 2014-02-08 MED ORDER — ENSURE COMPLETE PO LIQD: 237.0000 mL | Freq: Two times a day (BID) | ORAL | Status: DC

## 2014-02-08 MED ORDER — PREGABALIN 75 MG PO CAPS
300.0000 mg | ORAL_CAPSULE | Freq: Every day | ORAL | Status: DC
  Administered 2014-02-08 – 2014-02-11 (×4): 300 mg via ORAL
  Filled 2014-02-08 (×4): qty 4

## 2014-02-08 MED ORDER — LIDOCAINE 5 % EX PTCH
1.0000 | MEDICATED_PATCH | CUTANEOUS | Status: DC
  Administered 2014-02-08 – 2014-02-10 (×3): 1 via TRANSDERMAL
  Filled 2014-02-08 (×5): qty 1

## 2014-02-08 MED ORDER — SODIUM CHLORIDE 0.9 % IJ SOLN
3.0000 mL | Freq: Two times a day (BID) | INTRAMUSCULAR | Status: DC
  Administered 2014-02-08: 3 mL via INTRAVENOUS

## 2014-02-08 MED ORDER — OXYCODONE HCL 5 MG PO TABS
5.0000 mg | ORAL_TABLET | ORAL | Status: DC | PRN
  Administered 2014-02-08 – 2014-02-12 (×17): 5 mg via ORAL
  Filled 2014-02-08 (×17): qty 1

## 2014-02-08 MED ORDER — PANTOPRAZOLE SODIUM 40 MG PO TBEC
40.0000 mg | DELAYED_RELEASE_TABLET | Freq: Every day | ORAL | Status: DC
  Administered 2014-02-08 – 2014-02-12 (×5): 40 mg via ORAL
  Filled 2014-02-08 (×5): qty 1

## 2014-02-08 MED ORDER — IPRATROPIUM-ALBUTEROL 0.5-2.5 (3) MG/3ML IN SOLN
3.0000 mL | Freq: Four times a day (QID) | RESPIRATORY_TRACT | Status: DC | PRN
  Administered 2014-02-08 – 2014-02-09 (×2): 3 mL via RESPIRATORY_TRACT
  Filled 2014-02-08 (×2): qty 3

## 2014-02-08 MED ORDER — ACETAMINOPHEN 650 MG RE SUPP
650.0000 mg | Freq: Four times a day (QID) | RECTAL | Status: DC | PRN
Start: 2014-02-08 — End: 2014-02-12

## 2014-02-08 MED ORDER — SODIUM CHLORIDE 0.9 % IV SOLN
INTRAVENOUS | Status: DC
  Administered 2014-02-08: 75 mL/h via INTRAVENOUS
  Administered 2014-02-08 – 2014-02-11 (×6): via INTRAVENOUS

## 2014-02-08 MED ORDER — ACETAMINOPHEN 325 MG PO TABS
650.0000 mg | ORAL_TABLET | Freq: Four times a day (QID) | ORAL | Status: DC | PRN
  Filled 2014-02-08: qty 2

## 2014-02-08 MED ORDER — ONDANSETRON HCL 4 MG PO TABS: 4.0000 mg | ORAL_TABLET | Freq: Four times a day (QID) | ORAL | Status: DC | PRN

## 2014-02-08 MED ORDER — IOHEXOL 350 MG/ML SOLN
100.0000 mL | Freq: Once | INTRAVENOUS | Status: AC | PRN
  Administered 2014-02-08: 80 mL via INTRAVENOUS

## 2014-02-08 MED ORDER — HEPARIN SODIUM (PORCINE) 5000 UNIT/ML IJ SOLN
5000.0000 [IU] | Freq: Three times a day (TID) | INTRAMUSCULAR | Status: DC
  Administered 2014-02-08 – 2014-02-12 (×12): 5000 [IU] via SUBCUTANEOUS
  Filled 2014-02-08 (×16): qty 1

## 2014-02-08 NOTE — Progress Notes (Signed)
Upon patient transfer to assigned room 2 W16. Emergency EMT  Personnel was walking patient from the stretcher to the patient room.  Per EMT personnel, patient  Had syncopal episode with knees buckled and EMT personnel supported patient.  Patient did not fall to the floor  Or make any contact with any furniture.  EMT personnel helped patient to bed. Floor nurses assisted with getting patient settled in bed.  Patient verbalized where she was and does not remember "passing out". Patient resting comfortably in bed. Vital signs obtained.  Patient placed on telemetry monitor with normal sinus rhythm with a rate of 62.  RN will continue to monitor patient.

## 2014-02-08 NOTE — ED Provider Notes (Signed)
I saw and evaluated the patient, reviewed the resident's note and I agree with the findings and plan.   EKG Interpretation   Date/Time:  Wednesday February 06 2014 11:27:49 EDT Ventricular Rate:  79 PR Interval:  157 QRS Duration: 88 QT Interval:  402 QTC Calculation: 461 R Axis:   81 Text Interpretation:  Sinus rhythm No significant change since last  tracing Confirmed by Mailani Degroote  MD, Karri Kallenbach ([id number]) on 02/06/2014 2:53:50 PM       Patient presents after having worsening back pain and a possible syncopal episode. The patient's main complaint appears to be back pain. She was here for a carotid duplex due to her recurrent syncope. At the time she is neurovascular intact is having worsening back pain. She has these flares quite often. She's had multiple back surgeries. No findings concerning for cauda equina. At this time her pain has improved and she's feeling well enough to go home. We'll discharge.  Audree CamelScott T Brookelin Felber, MD 02/08/14 [phone]

## 2014-02-08 NOTE — H&P (Signed)
[facility] History and Physical  Patient: Rachael Ramirez  BJY:[id number]  DOB: 1971/05/28  DOS: the patient was seen and examined on 02/08/2014 PCP: [name], MD  Chief Complaint: Unresponsiveness  HPI: Rachael Ramirez is a 43 y.o. female with Past medical history of chronic low back pain status post laminectomy with residue radiculopathy, anxiety, depression, GERD. The patient presented with complaints of unresponsiveness. Patient was awake and alert at the time of my evaluation complaining of back pain and close to her baseline. She mentions that she was walking to get her mail and suddenly felt dizzy and had a blackening out sensation and passed out. Since the neighbor saw her falling and could not find a pulse started CPR and EMS was called. EMS found the patient was breathing shallow with pulse started her rescue breathing, inserted an I/O and the patient was immediately awake and at her baseline without any confusion. She will also tiredness and fatigue. At the time of my evaluation she denies any headache, blurring of her vision, choking episode, dizziness or lightheadedness lying down, chest pain, palpitation, nausea, vomiting, or any new focal deficit. She has been presented to ER multiple times with complaints of dizziness and lightheadedness with near-syncope in the last 1 week. One week ago she was not having any dizziness or lightheadedness. She has seen her neurologist 2 months ago due to persistent low back pain who has scheduled her for an outpatient MRI today. No recent change in her medications.  The patient is coming from home. And at her baseline independent for most of her ADL.  Review of Systems: as mentioned in the history of present illness.  A Comprehensive review of the other systems is negative.  Past Medical History  Diagnosis Date  . Back pain   . Asthma     uses Combivent daily as needed  . Insomnia     takes Trazodone nightly  . Anxiety    takes Klonopin daily as needed  . PONV (postoperative nausea and vomiting)   . History of bronchitis   . Cough   . Spinal headache   . Weakness     left leg  . Chronic back pain     HNP and Radiculopathy  . GERD (gastroesophageal reflux disease)     takes Omeprazole daily  . Urinary urgency   . Bipolar 1 disorder     takes Lamictal daily  . Chronic low back pain 08/22/2013  . MRSA (methicillin resistant Staphylococcus aureus)     Postive nasal swab   Past Surgical History  Procedure Laterality Date  . Hand surgery Right 1992    has pins fracture  . Spinal fusion  1996  . Left arm surgery  1975    left elbow fracture  . Redo left arm surgery  2002  . Cervical fusion    . Back surgery      x 4 one with fusion  . Harware removal from back    . Epidural block injection    . Lumbar laminectomy/decompression microdiscectomy Left 07/13/2013    Procedure: Left Lumbar four-five microdiskectomy;  Surgeon: Carmela Hurt, MD;  Location: [facility];  Service: Neurosurgery;  Laterality: Left;  Left Lumbar four-five microdiskectomy  . Lumbar wound debridement N/A 09/21/2013    Procedure: LUMBAR WOUND re-exploration and disectomy;  Surgeon: Carmela Hurt, MD;  Location: [facility];  Service: Neurosurgery;  Laterality: N/A;   Social History:  reports that she has been smoking.  She has  never used smokeless tobacco. She reports that she does not drink alcohol or use illicit drugs.  Allergies  Allergen Reactions  . Minocycline Nausea And Vomiting    Family History  Problem Relation Age of Onset  . Multiple sclerosis Mother   . COPD Father   . Alcohol abuse Maternal Grandfather   . Alcohol abuse Brother   . Cirrhosis Brother     Prior to Admission medications   Medication Sig Start Date End Date Taking? Authorizing Provider  albuterol-ipratropium (COMBIVENT) 18-103 MCG/ACT inhaler Inhale 1 puff into the lungs every 6 (six) hours as needed for wheezing or shortness of breath. For  wheezing   Yes Historical Provider, MD  aspirin-acetaminophen-caffeine (EXCEDRIN MIGRAINE) [phone] MG per tablet Take 2 tablets by mouth every 6 (six) hours as needed for headache.   Yes Historical Provider, MD  HYDROcodone-acetaminophen (NORCO) 10-325 MG per tablet Take 1 tablet by mouth every 6 (six) hours as needed for moderate pain.    Yes Historical Provider, MD  omeprazole (PRILOSEC) 20 MG capsule Take 20 mg by mouth daily.    Yes Historical Provider, MD  ondansetron (ZOFRAN) 8 MG tablet Take 8 mg by mouth every 6 (six) hours as needed for vomiting.  01/31/14  Yes Historical Provider, MD  oxyCODONE (ROXICODONE) 5 MG immediate release tablet Take 1 tablet (5 mg total) by mouth every 4 (four) hours as needed for severe pain. 02/06/14  Yes Melene Plan, MD  pregabalin (LYRICA) 300 MG capsule Take 300 mg by mouth daily.    Yes Historical Provider, MD  traZODone (DESYREL) 150 MG tablet Take 300 mg by mouth at bedtime.   Yes Historical Provider, MD    Physical Exam: Filed Vitals:   02/07/14 2300 02/07/14 2315 02/07/14 2330 02/07/14 2345  BP: 161/93 169/94 153/96 148/93  Pulse: 59 62 59 55  Temp:      TempSrc:      Resp: 21 15 11 28   SpO2: 100% 100% 100% 99%    General: Alert, Awake and Oriented to Time, Place and Person. Appear in mild distress Eyes: PERRL ENT: Oral Mucosa clear moist. Neck: no JVD Cardiovascular: S1 and S2 Present, no Murmur, Peripheral Pulses Present Respiratory: Bilateral Air entry equal and Decreased, Clear to Auscultation, noCrackles, no wheezes Abdomen: Bowel Sound Present, Soft and Non tender Skin: no Rash Extremities: no Pedal edema, no calf tenderness Neurologic: Grossly no focal neuro deficit other than generalized motor weakness.  Labs on Admission:  CBC:  Recent Labs Lab 02/03/14 2033 02/05/14 1614 02/05/14 1710 02/06/14 1116 02/07/14 2229  WBC 8.9 5.9  --   --  7.0  NEUTROABS 5.7  --   --   --  3.7  HGB 14.3 14.6 16.7* 13.9 13.6  HCT 42.8 43.4  49.0* 41.0 40.4  MCV 97.7 95.6  --   --  96.2  PLT 363 373  --   --  305    CMP     Component Value Date/Time   NA 144 02/07/2014 2153   NA 140 08/22/2013 0859   K 3.8 02/07/2014 2153   CL 106 02/07/2014 2153   CO2 25 02/07/2014 2153   GLUCOSE 87 02/07/2014 2153   GLUCOSE 80 08/22/2013 0859   BUN 4* 02/07/2014 2153   BUN 4* 08/22/2013 0859   CREATININE 0.66 02/07/2014 2153   CALCIUM 8.3* 02/07/2014 2153   PROT 6.3 02/07/2014 2153   PROT 5.8* 08/22/2013 0859   ALBUMIN 3.1* 02/07/2014 2153   AST 23 02/07/2014  2153   ALT 8 02/07/2014 2153   ALKPHOS 87 02/07/2014 2153   BILITOT 0.3 02/07/2014 2153   GFRNONAA >90 02/07/2014 2153   GFRAA >90 02/07/2014 2153    No results found for this basename: LIPASE, AMYLASE,  in the last 168 hours No results found for this basename: AMMONIA,  in the last 168 hours   Recent Labs Lab 02/03/14 2014 02/08/14 0013  TROPONINI <0.30 <0.30   BNP (last 3 results) No results found for this basename: PROBNP,  in the last 8760 hours  Radiological Exams on Admission: Dg Lumbar Spine Complete  02/06/2014   CLINICAL DATA:  low back pain post fall  EXAM: LUMBAR SPINE - COMPLETE 4+ VIEW  COMPARISON:  02/03/2014  FINDINGS: Prior interbody fusion at L5-S1 is again seen. The lateral pedicular screws on the left are noted with posterior fixation. Disc space narrowing is noted at L4-5 and stable. Compression deformity at L1 is again noted. No acute bony abnormality is noted. No hardware failure is seen.  IMPRESSION: Chronic changes and postoperative change. No acute abnormality is noted.   Electronically Signed   By: Alcide CleverMark  Lukens M.D.   On: 02/06/2014 12:36   Dg Chest Port 1 View  02/07/2014   CLINICAL DATA:  Chest pain and difficulty breathing.  EXAM: PORTABLE CHEST - 1 VIEW  COMPARISON:  Chest x-ray 02/05/2014.  FINDINGS: Lung volumes are normal. No consolidative airspace disease. No pleural effusions. No pneumothorax. No pulmonary nodule or mass noted. Pulmonary vasculature  and the cardiomediastinal silhouette are within normal limits.  IMPRESSION: No radiographic evidence of acute cardiopulmonary disease.   Electronically Signed   By: Trudie Reedaniel  Entrikin M.D.   On: 02/07/2014 23:56    EKG: Independently reviewed. normal sinus rhythm, nonspecific ST and T waves changes. Assessment/Plan Principal Problem:   Unresponsive episode Active Problems:   Chronic low back pain   History of suicide attempt   Depression   Lumbar herniated disc   1. Unresponsive episode The patient is presenting with an episode of unresponsiveness. As per the history provided by the EMS the patient was completely awake and oriented immediately after insertion of the I/O without any confusion or focal deficit, which makes significant cardiac or neurological event less likely. EKG is not showing any acute abnormality, troponins are negative, lactic acid level is negative, LSD and serum creatinine also within normal limits, no leukocytosis hemoglobin stable. CT of the head is negative for any acute intracranial abnormality, chest x-ray does not show any evidence of pneumonia. Her d-dimer was elevated but CT of the chest was negative for any pulmonary embolism. I was unable to perform orthostatic as the patient was complaining of severe back pain. With this the patient will be admitted to the hospital for observation. I will obtain serial neuro checks, telemetry monitoring, echocardiogram in the morning, serial troponin for further workup. Gentle IV hydration will also be provided. ABG does not show any evidence of hypercarbia and with a severe complain of back pain and being on chronic pain medication I would continue with judicious use of narcotic medications for control of her pain. Possible etiology is vasovagal, neurocardiogenic otherwise, spinal hypotension, functional. Less likely a cardiopulmonary arrest.  2.Chronic back pain Continue Lyrica and trazodone  DVT Prophylaxis: subcutaneous  Heparin Nutrition: Regular diet  Code Status: Full  Family Communication: Significant other  was present at bedside, opportunity was given to ask question and all questions were answered satisfactorily at the time of interview. Disposition: Admitted to  inpatient in telemetry unit.  Author: Lynden Oxford, MD [facility] Pager: [phone] 02/08/2014, 1:05 AM    If 7PM-7AM, please contact night-coverage [email or web address] Password [email or web address]  **Disclaimer: This note may have been dictated with voice recognition software. Similar sounding words can inadvertently be transcribed and this note may contain transcription errors which may not have been corrected upon publication of note.**

## 2014-02-08 NOTE — Progress Notes (Signed)
Hospitalist paged with syncopal episode.  RN will continue to monitor patient

## 2014-02-08 NOTE — Progress Notes (Signed)
Pt had couple of syncope episode when assisting to the BR. Pt vitals taken but they were stable; MD notified of episodes; pt threaten to leave AMA consistently but when given the form pt changes her mind and not sign the form. Safety sitter remains at pt side. Pt also voices she think she's having panic attacks and wants something for it because she received dilaudid and Valium together in the ED and she behaved well with no problem. Reported off to another Charity fundraiserN. Arabella MerlesP. Amo Linell Meldrum RN.

## 2014-02-08 NOTE — Progress Notes (Signed)
INITIAL NUTRITION ASSESSMENT  DOCUMENTATION CODES Per approved criteria  -Not Applicable   INTERVENTION: Ensure Complete po TID, each supplement provides 350 kcal and 13 grams of protein  NUTRITION DIAGNOSIS: Inadequate oral intake related to pain as evidenced by reported intake less than estimated needs.   Goal: Pt to meet >/= 90% of their estimated nutrition needs   Monitor:  Weight trends, po intake, acceptance of supplements, labs  Reason for Assessment: MST  43 y.o. female  Admitting Dx: Unresponsive episode  ASSESSMENT: 43 y.o. female with Past medical history of chronic low back pain status post laminectomy with residue radiculopathy, anxiety, depression, GERD. The patient presented with complaints of unresponsiveness.  - Pt diagnosed with malnutrition last admission. Weight has increased 8 lbs in 5 months.  - Pt says that she is able to eat bites of foods, but she doesn't have an appetite. This morning she had a few bites of eggs and sausage. She says that she likes Ensure Complete and will drink it if sent. Pt appeared tearful and in pain during RD visit. Unable to perform Nutrition-Focused Physical Exam at this time.   CBGs: 86-101  Height: Ht Readings from Last 1 Encounters:  02/08/14 5\' 1"  (1.549 m)    Weight: Wt Readings from Last 1 Encounters:  02/08/14 100 lb 1.4 oz (45.4 kg)    Ideal Body Weight: 47.8 kg  % Ideal Body Weight: 95%  Wt Readings from Last 10 Encounters:  02/08/14 100 lb 1.4 oz (45.4 kg)  02/06/14 99 lb (44.906 kg)  02/05/14 99 lb (44.906 kg)  02/03/14 99 lb (44.906 kg)  01/10/14 98 lb (44.453 kg)  09/19/13 92 lb 13 oz (42.1 kg)  09/19/13 92 lb 13 oz (42.1 kg)  09/19/13 92 lb 13 oz (42.1 kg)  08/23/13 100 lb (45.36 kg)  08/22/13 106 lb (48.081 kg)    Usual Body Weight: 95-105 lbs  % Usual Body Weight: 100%  BMI:  Body mass index is 18.92 kg/(m^2).  Estimated Nutritional Needs: Kcal: 1225-1450 Protein: 70-80 g Fluid: ~1.5  L/day  Skin: Intact  Diet Order: General  EDUCATION NEEDS: -Education needs addressed   Intake/Output Summary (Last 24 hours) at 02/08/14 1108 Last data filed at 02/08/14 0950  Gross per 24 hour  Intake    240 ml  Output   1300 ml  Net  -1060 ml    Last BM: none recorded   Labs:   Recent Labs Lab 02/03/14 2033 02/05/14 1710 02/06/14 1116 02/07/14 2153  NA 143 145 140 144  K 3.3* 3.0* 3.1* 3.8  CL 103 104 102 106  CO2 23  --   --  25  BUN 5* <3* <3* 4*  CREATININE 0.67 0.80 0.70 0.66  CALCIUM 9.1  --   --  8.3*  GLUCOSE 99 86 98 87    CBG (last 3)   Recent Labs  02/05/14 1618 02/06/14 1101 02/08/14 0209  GLUCAP 86 101* 94    Scheduled Meds: . heparin  5,000 Units Subcutaneous 3 times per day  . pantoprazole  40 mg Oral Daily  . pregabalin  300 mg Oral Daily  . sodium chloride  3 mL Intravenous Q12H  . traZODone  300 mg Oral QHS    Continuous Infusions: . sodium chloride 75 mL/hr (02/08/14 0528)    Past Medical History  Diagnosis Date  . Back pain   . Asthma     uses Combivent daily as needed  . Insomnia  takes Trazodone nightly  . Anxiety     takes Klonopin daily as needed  . PONV (postoperative nausea and vomiting)   . History of bronchitis   . Cough   . Spinal headache   . Weakness     left leg  . Chronic back pain     HNP and Radiculopathy  . GERD (gastroesophageal reflux disease)     takes Omeprazole daily  . Urinary urgency   . Bipolar 1 disorder     takes Lamictal daily  . Chronic low back pain 08/22/2013  . MRSA (methicillin resistant Staphylococcus aureus)     Postive nasal swab    Past Surgical History  Procedure Laterality Date  . Hand surgery Right 1992    has pins fracture  . Spinal fusion  1996  . Left arm surgery  1975    left elbow fracture  . Redo left arm surgery  2002  . Cervical fusion    . Back surgery      x 4 one with fusion  . Harware removal from back    . Epidural block injection    . Lumbar  laminectomy/decompression microdiscectomy Left 07/13/2013    Procedure: Left Lumbar four-five microdiskectomy;  Surgeon: Carmela Hurt, MD;  Location: [facility];  Service: Neurosurgery;  Laterality: Left;  Left Lumbar four-five microdiskectomy  . Lumbar wound debridement N/A 09/21/2013    Procedure: LUMBAR WOUND re-exploration and disectomy;  Surgeon: Carmela Hurt, MD;  Location: [facility];  Service: Neurosurgery;  Laterality: N/A;    Rachael Ramirez RD, LDN

## 2014-02-08 NOTE — Evaluation (Signed)
Physical Therapy Evaluation Patient Details Name: Rachael FreezeRachel Mooers MRN: [id number] DOB: 02/12/1971 Today's Date: 02/08/2014   History of Present Illness  43 yo female presenting with multiple incidence of syncope and unresposiveness.   Clinical Impression  Patient demonstrates deficits in functional mobility as indicated below. Will need continued skilled PT to address deficits and maximize function.  OF NOTE: During session, patient was standing at bedside with plus 2 hands on physical guarding and gait belt. Patient was using rolling walker, Stood for approximately 2 minutes and then pt eyes began to roll back and patient had syncopal episode with non responsiveness. +2 total assist to prevent fall, assisted patient to bed and performed extremely aggressive sternal rub for approximately 40 seconds, patient had no response or withdrawal from pain during extreme sternal rub. Call bell alerted to call for VS via telemetry at time of syncope (nsg reports HR was stable in the 80s) VS assessed in room via dynamap, stable vital signs (could not assess during unresponsiveness secondary to repositioning patient in bed safely and attempting arousal techniques.) Pupils pin point bilaterally with very minimal reaction to penlight post event, improved as arousal improved- event lasted approximately 2 minutes in duration from time of event to time of coherent vocalization. Nsg notified.      Follow Up Recommendations Supervision/Assistance - 24 hour  (Spoke with spouse regarding concern for patient safety given multiple history of falls and syncopal events, advised patient may benefit from SNF placement if 24/7 assist was not available for mobility, spouse in agreement, )       Equipment Recommendations     (TBD)    Recommendations for Other Services       Precautions / Restrictions Precautions Precautions: Fall Restrictions Weight Bearing Restrictions: No      Mobility  Bed Mobility Overal bed  mobility: Needs Assistance Bed Mobility: Rolling;Sidelying to Sit;Sit to Supine Rolling: Supervision Sidelying to sit: Supervision   Sit to supine: Total assist   General bed mobility comments: Vcs for sequencing and log roll technique for coming to sitting EOB secondary to persons complaints of back pain.  Patient total assist back to bed secondary to syncopal episode  Transfers Overall transfer level: Needs assistance Equipment used: Rolling walker (2 wheeled) Transfers: Sit to/from Stand Sit to Stand: Min guard;Total assist;+2 physical assistance         General transfer comment: Min guard with VCs for hand placement when coming to standing, +2 total assist to return to sitting secondary to syncopal episode  Ambulation/Gait                Stairs            Wheelchair Mobility    Modified Rankin (Stroke Patients Only)       Balance                                             Pertinent Vitals/Pain      Home Living Family/patient expects to be discharged to:: Private residence Living Arrangements: Spouse/significant other;Children Available Help at Discharge: Family Type of Home: House Home Access: Stairs to enter Entrance Stairs-Rails: Left Entrance Stairs-Number of Steps: 4 Home Layout: One level Home Equipment: None Additional Comments: husbands works. pt has family in the area available to provide assistance    Prior Function Level of Independence: Independent  Comments: pt reports she didn't move around much but was able to perform all ADLs independently. Pt reports she was able to walk but not very well due to the LBP      Hand Dominance   Dominant Hand: Right    Extremity/Trunk Assessment   Upper Extremity Assessment: Generalized weakness           Lower Extremity Assessment: Generalized weakness      Cervical / Trunk Assessment:  (reports pain in low back)  Communication   Communication: No  difficulties  Cognition Arousal/Alertness: Awake/alert Behavior During Therapy: Flat affect Overall Cognitive Status: Within Functional Limits for tasks assessed                      General Comments      Exercises        Assessment/Plan    PT Assessment Patient needs continued PT services  PT Diagnosis Difficulty walking   PT Problem List Decreased activity tolerance;Decreased balance;Decreased mobility  PT Treatment Interventions DME instruction;Gait training;Stair training;Functional mobility training;Therapeutic activities;Therapeutic exercise;Balance training;Patient/family education   PT Goals (Current goals can be found in the Care Plan section) Acute Rehab PT Goals Patient Stated Goal: to stop "falling out" PT Goal Formulation: With patient/family Time For Goal Achievement: 02/22/14 Potential to Achieve Goals: Fair    Frequency Min 3X/week   Barriers to discharge Decreased caregiver support      Co-evaluation               End of Session Equipment Utilized During Treatment: Gait belt;Oxygen Activity Tolerance: Treatment limited secondary to medical complications (Comment) (syncope see clinical assessment) Patient left: in bed;with call bell/phone within reach;with bed alarm set;with nursing/sitter in room;with family/visitor present Nurse Communication: Mobility status;Other (comment) (adverse event during session)         Time: 1610-9604 PT Time Calculation (min): 20 min   Charges:   PT Evaluation $Initial PT Evaluation Tier I: 1 Procedure PT Treatments $Therapeutic Activity: 8-22 mins   PT G CodesFabio Asa 02/08/2014, 4:53 PM  Charlotte Crumb, PT DPT  [phone]

## 2014-02-08 NOTE — Progress Notes (Signed)
Pt requested valium for anxiety pt states that she was taken Klonopin 3mg  day 6mths ago. Stated that she stopped taking it. Orthostatics vs obtained lying: 130/87, 75 sitting 130/85 75, standing(somewhat) 130/80. Pt passed out. o2 sats dropped to 75% on room air. Right eye pupil extremely sluggish and pin point during episode left sluggish, resolved after pt more arousable. Called dr. Benjamine MolaVann made her aware. Monitoring will continue.

## 2014-02-08 NOTE — Progress Notes (Signed)
PROGRESS NOTE  Rachael FreezeRachel Ramirez [id number] DOB: 04/13/1971 DOA: 02/07/2014 PCP: Kaleen MaskELKINS,WILSON OLIVER, MD  Assessment/Plan: Unresponsive episode  -As per the history provided by the EMS the patient was completely awake and oriented immediately after  given narcan  -troponins are negative, lactic acid level is negative, no leukocytosis hemoglobin stable.  -CT of the head is negative for any acute intracranial abnormality, chest x-ray does not show any evidence of pneumonia.  -CT of the chest was negative for any pulmonary embolism.  - telemetry monitoring, echocardiogram in the morning, serial troponin for further workup.    Possible etiology is vasovagal, neurocardiogenic otherwise, spinal hypotension, functional. Less likely a cardiopulmonary arrest.   Chronic back pain  Continue Lyrica and trazodone -limit IV pain medications Was to have MRI today as outpatient- will order  3 mm nodule in the lateral segment of the right middle lobe. This is highly nonspecific, and statistically likely benign in this young patient. If the patient is at high risk for bronchogenic carcinoma, follow-up chest CT at 1 year is recommended. If the patient is at low risk, no follow-up is needed.     Code Status: full Family Communication: Disposition Plan:    Consultants:  none  Procedures:      HPI/Subjective: Tearful, c/o severe back pain while falling asleep- Got out of bed with NO ISSUES and not limited by pain  Objective: Filed Vitals:   02/08/14 0536  BP: 137/107  Pulse: 72  Temp: 97.4 F (36.3 C)  Resp: 16    Intake/Output Summary (Last 24 hours) at 02/08/14 1145 Last data filed at 02/08/14 0950  Gross per 24 hour  Intake    240 ml  Output   1300 ml  Net  -1060 ml   Filed Weights   02/08/14 0214  Weight: 45.4 kg (100 lb 1.4 oz)    Exam:    Data Reviewed: Basic Metabolic Panel:  Recent Labs Lab 02/03/14 2033 02/05/14 1710 02/06/14 1116 02/07/14 2153  NA 143  145 140 144  K 3.3* 3.0* 3.1* 3.8  CL 103 104 102 106  CO2 23  --   --  25  GLUCOSE 99 86 98 87  BUN 5* <3* <3* 4*  CREATININE 0.67 0.80 0.70 0.66  CALCIUM 9.1  --   --  8.3*   Liver Function Tests:  Recent Labs Lab 02/03/14 2033 02/07/14 2153  AST 14 23  ALT <5 8  ALKPHOS 112 87  BILITOT 0.2* 0.3  PROT 7.3 6.3  ALBUMIN 3.7 3.1*   No results found for this basename: LIPASE, AMYLASE,  in the last 168 hours No results found for this basename: AMMONIA,  in the last 168 hours CBC:  Recent Labs Lab 02/03/14 2033 02/05/14 1614 02/05/14 1710 02/06/14 1116 02/07/14 2229  WBC 8.9 5.9  --   --  7.0  NEUTROABS 5.7  --   --   --  3.7  HGB 14.3 14.6 16.7* 13.9 13.6  HCT 42.8 43.4 49.0* 41.0 40.4  MCV 97.7 95.6  --   --  96.2  PLT 363 373  --   --  305   Cardiac Enzymes:  Recent Labs Lab 02/03/14 2014 02/08/14 0013  TROPONINI <0.30 <0.30   BNP (last 3 results) No results found for this basename: PROBNP,  in the last 8760 hours CBG:  Recent Labs Lab 02/03/14 2030 02/05/14 1618 02/06/14 1101 02/08/14 0209  GLUCAP 104* 86 101* 94    Recent Results (from the past 240  hour(s))  MRSA PCR SCREENING     Status: None   Collection Time    02/08/14  2:19 AM      Result Value Ref Range Status   MRSA by PCR NEGATIVE  NEGATIVE Final   Comment:            The GeneXpert MRSA Assay (FDA     approved for NASAL specimens     only), is one component of a     comprehensive MRSA colonization     surveillance program. It is not     intended to diagnose MRSA     infection nor to guide or     monitor treatment for     MRSA infections.     Studies: Dg Lumbar Spine Complete  02/06/2014   CLINICAL DATA:  low back pain post fall  EXAM: LUMBAR SPINE - COMPLETE 4+ VIEW  COMPARISON:  02/03/2014  FINDINGS: Prior interbody fusion at L5-S1 is again seen. The lateral pedicular screws on the left are noted with posterior fixation. Disc space narrowing is noted at L4-5 and stable.  Compression deformity at L1 is again noted. No acute bony abnormality is noted. No hardware failure is seen.  IMPRESSION: Chronic changes and postoperative change. No acute abnormality is noted.   Electronically Signed   By: Alcide Clever M.D.   On: 02/06/2014 12:36   Ct Head Wo Contrast  02/08/2014   CLINICAL DATA:  Patient found down and unresponsive.  EXAM: CT HEAD WITHOUT CONTRAST  TECHNIQUE: Contiguous axial images were obtained from the base of the skull through the vertex without intravenous contrast.  COMPARISON:  Head CT 02/03/2014.  FINDINGS: No acute intracranial abnormalities. Specifically, no evidence of acute intracranial hemorrhage, no definite findings of acute/subacute cerebral ischemia, no mass, mass effect, hydrocephalus or abnormal intra or extra-axial fluid collections. Visualized paranasal sinuses and mastoids are well pneumatized. No acute displaced skull fractures are identified.  IMPRESSION: *No acute intracranial abnormalities. *The appearance of the brain is normal.   Electronically Signed   By: Trudie Reed M.D.   On: 02/08/2014 01:49   Ct Angio Chest Pe W/cm &/or Wo Cm  02/08/2014   CLINICAL DATA:  Mid chest pain.  Elevated D-dimer.  EXAM: CT ANGIOGRAPHY CHEST WITH CONTRAST  TECHNIQUE: Multidetector CT imaging of the chest was performed using the standard protocol during bolus administration of intravenous contrast. Multiplanar CT image reconstructions and MIPs were obtained to evaluate the vascular anatomy.  CONTRAST:  80mL OMNIPAQUE IOHEXOL 350 MG/ML SOLN  COMPARISON:  Chest CT 09/17/2013.  FINDINGS: Mediastinum: No filling defects within the pulmonary arterial tree to suggest underlying pulmonary embolism. Heart size is normal. There is no significant pericardial fluid, thickening or pericardial calcification. No pathologically enlarged mediastinal or hilar lymph nodes. Esophagus is unremarkable in appearance. 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  Lungs/Pleura: No acute consolidative airspace disease. No pleural effusions. 3 mm right middle lobe nodule (image 75 of series 506). No other suspicious appearing pulmonary nodules or masses.  Upper Abdomen: Unremarkable.  Musculoskeletal: There are no aggressive appearing lytic or blastic lesions noted in the visualized portions of the skeleton.  Review of the MIP images confirms the above findings.  IMPRESSION: 1. No evidence of pulmonary embolism. 2. No acute findings to account for the patient's symptoms. 3. 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 4. 3 mm nodule in the lateral segment of the right middle lobe. This is highly nonspecific, and statistically  likely benign in this young patient. If the patient is at high risk for bronchogenic carcinoma, follow-up chest CT at 1 year is recommended. If the patient is at low risk, no follow-up is needed. This recommendation follows the consensus statement: Guidelines for Management of Small Pulmonary Nodules Detected on CT Scans: A Statement from the Fleischner Society as published in Radiology 2005; 237:395-400.   Electronically Signed   By: Trudie Reed M.D.   On: 02/08/2014 02:29   Dg Chest Port 1 View  02/07/2014   CLINICAL DATA:  Chest pain and difficulty breathing.  EXAM: PORTABLE CHEST - 1 VIEW  COMPARISON:  Chest x-ray 02/05/2014.  FINDINGS: Lung volumes are normal. No consolidative airspace disease. No pleural effusions. No pneumothorax. No pulmonary nodule or mass noted. Pulmonary vasculature and the cardiomediastinal silhouette are within normal limits.  IMPRESSION: No radiographic evidence of acute cardiopulmonary disease.   Electronically Signed   By: Trudie Reed M.D.   On: 02/07/2014 23:56    Scheduled Meds: . feeding supplement (ENSURE COMPLETE)  237 mL Oral TID BM  . heparin  5,000 Units Subcutaneous 3 times per day  . pantoprazole  40 mg Oral Daily  . pregabalin  300 mg  Oral Daily  . sodium chloride  3 mL Intravenous Q12H  . traZODone  300 mg Oral QHS   Continuous Infusions: . sodium chloride 75 mL/hr (02/08/14 0528)   Antibiotics Given (last 72 hours)   None      Principal Problem:   Unresponsive episode Active Problems:   Chronic low back pain   History of suicide attempt   Depression   Lumbar herniated disc    Time spent: 35 min    Rachael Ramirez  [facility] Pager [phone]. If 7PM-7AM, please contact night-coverage at [email or web address], password [facility] 02/08/2014, 11:45 AM  LOS: 1 day

## 2014-02-08 NOTE — Progress Notes (Signed)
Called into room after patient had a "syncopal" event.  NSR on tele, BP stable, no confusion afterwards Marlin CanaryJessica Essex Perry DO

## 2014-02-08 NOTE — ED Notes (Signed)
When I got pt to her room she asked to walk to restroom, I was walking her to restroom she pasted out I was able to hold her up with out her hitting anything, and got her back to bed RN made aware.

## 2014-02-08 NOTE — Progress Notes (Signed)
Echocardiogram 2D Echocardiogram has been performed.  Dorothey BasemanReel, Zavannah Deblois M 02/08/2014, 12:36 PM

## 2014-02-08 NOTE — ED Notes (Signed)
Patient transported to CT 

## 2014-02-08 NOTE — ED Provider Notes (Signed)
CSN: [id number]     Arrival date & time 02/07/14  2127 History   First MD Initiated Contact with Patient 02/07/14 2127     Chief Complaint  Patient presents with  . Altered Mental Status     (Consider location/radiation/quality/duration/timing/severity/associated sxs/prior Treatment) Patient is a 43 y.o. female presenting with altered mental status. The history is provided by the patient and the EMS personnel.  Altered Mental Status Associated symptoms: no abdominal pain, no headaches, no nausea, no rash, no vomiting and no weakness    patient was brought in for possible cardiac arrest. Was reportedly found in her front yard. Patient initially had decreased responsiveness, but is improved after arrival the ER. Reportedly found by neighbor and CPR started. Upon arrival of fire found her apneic but did have a pulse. Given some Narcan and continued bagging with improvement of her mental status. Patient states she does not know what happened. She's really only complaining of her back pain and now pain in her chest. She states she has passed out a lot recently. She's had recent admissions for the same and has had 2 ER visits in the last 2 days for back pain and syncope. Reportedly is scheduled for an MRI tomorrow.  Past Medical History  Diagnosis Date  . Back pain   . Asthma     uses Combivent daily as needed  . Insomnia     takes Trazodone nightly  . Anxiety     takes Klonopin daily as needed  . PONV (postoperative nausea and vomiting)   . History of bronchitis   . Cough   . Spinal headache   . Weakness     left leg  . Chronic back pain     HNP and Radiculopathy  . GERD (gastroesophageal reflux disease)     takes Omeprazole daily  . Urinary urgency   . Bipolar 1 disorder     takes Lamictal daily  . Chronic low back pain 08/22/2013  . MRSA (methicillin resistant Staphylococcus aureus)     Postive nasal swab   Past Surgical History  Procedure Laterality Date  . Hand surgery Right  1992    has pins fracture  . Spinal fusion  1996  . Left arm surgery  1975    left elbow fracture  . Redo left arm surgery  2002  . Cervical fusion    . Back surgery      x 4 one with fusion  . Harware removal from back    . Epidural block injection    . Lumbar laminectomy/decompression microdiscectomy Left 07/13/2013    Procedure: Left Lumbar four-five microdiskectomy;  Surgeon: Carmela Hurt, MD;  Location: [facility];  Service: Neurosurgery;  Laterality: Left;  Left Lumbar four-five microdiskectomy  . Lumbar wound debridement N/A 09/21/2013    Procedure: LUMBAR WOUND re-exploration and disectomy;  Surgeon: Carmela Hurt, MD;  Location: [facility];  Service: Neurosurgery;  Laterality: N/A;   Family History  Problem Relation Age of Onset  . Multiple sclerosis Mother   . COPD Father   . Alcohol abuse Maternal Grandfather   . Alcohol abuse Brother   . Cirrhosis Brother    History  Substance Use Topics  . Smoking status: Current Every Day Smoker -- 1.00 packs/day for 18 years  . Smokeless tobacco: Never Used  . Alcohol Use: No   OB History   Grav Para Term Preterm Abortions TAB SAB Ect Mult Living  Review of Systems  Constitutional: Negative for activity change and appetite change.  Eyes: Negative for pain.  Respiratory: Negative for chest tightness and shortness of breath.   Cardiovascular: Positive for chest pain. Negative for leg swelling.  Gastrointestinal: Negative for nausea, vomiting, abdominal pain and diarrhea.  Genitourinary: Negative for flank pain.  Musculoskeletal: Positive for back pain. Negative for neck stiffness.  Skin: Negative for rash.  Neurological: Positive for syncope. Negative for weakness, numbness and headaches.  Psychiatric/Behavioral: Negative for behavioral problems.      Allergies  Minocycline  Home Medications   Prior to Admission medications   Medication Sig Start Date End Date Taking? Authorizing Provider   albuterol-ipratropium (COMBIVENT) 18-103 MCG/ACT inhaler Inhale 1 puff into the lungs every 6 (six) hours as needed for wheezing or shortness of breath. For wheezing   Yes Historical Provider, MD  aspirin-acetaminophen-caffeine (EXCEDRIN MIGRAINE) [phone] MG per tablet Take 2 tablets by mouth every 6 (six) hours as needed for headache.   Yes Historical Provider, MD  HYDROcodone-acetaminophen (NORCO) 10-325 MG per tablet Take 1 tablet by mouth every 6 (six) hours as needed for moderate pain.    Yes Historical Provider, MD  omeprazole (PRILOSEC) 20 MG capsule Take 20 mg by mouth daily.    Yes Historical Provider, MD  ondansetron (ZOFRAN) 8 MG tablet Take 8 mg by mouth every 6 (six) hours as needed for vomiting.  01/31/14  Yes Historical Provider, MD  oxyCODONE (ROXICODONE) 5 MG immediate release tablet Take 1 tablet (5 mg total) by mouth every 4 (four) hours as needed for severe pain. 02/06/14  Yes Melene Planan Floyd, MD  pregabalin (LYRICA) 300 MG capsule Take 300 mg by mouth daily.    Yes Historical Provider, MD  traZODone (DESYREL) 150 MG tablet Take 300 mg by mouth at bedtime.   Yes Historical Provider, MD   BP 148/93  Pulse 55  Temp(Src) 97.9 F (36.6 C) (Oral)  Resp 28  SpO2 99%  LMP 01/09/2014 Physical Exam  Constitutional: She appears well-developed and well-nourished.  HENT:  Head: Normocephalic.  Eyes: Pupils are equal, round, and reactive to light.  Neck: Normal range of motion.  Cardiovascular: Normal rate and regular rhythm.   Pulmonary/Chest: Effort normal and breath sounds normal.  Abdominal: Soft.  Musculoskeletal:  Lumbar tenderness  Neurological: She is alert.  Awake and appropriate, somewhat sedate.  Skin: Skin is warm and dry.    ED Course  Procedures (including critical care time) Labs Review Labs Reviewed  COMPREHENSIVE METABOLIC PANEL - Abnormal; Notable for the following:    BUN 4 (*)    Calcium 8.3 (*)    Albumin 3.1 (*)    All other components within normal  limits  SALICYLATE LEVEL - Abnormal; Notable for the following:    Salicylate Lvl <2.0 (*)    All other components within normal limits  D-DIMER, QUANTITATIVE - Abnormal; Notable for the following:    D-Dimer, Quant 1.08 (*)    All other components within normal limits  I-STAT ARTERIAL BLOOD GAS, ED - Abnormal; Notable for the following:    pO2, Arterial 123.0 (*)    Bicarbonate 28.3 (*)    Acid-Base Excess 3.0 (*)    All other components within normal limits  ACETAMINOPHEN LEVEL  ETHANOL  URINE RAPID DRUG SCREEN (HOSP PERFORMED)  PREGNANCY, URINE  CBC WITH DIFFERENTIAL  LACTIC ACID, PLASMA  BLOOD GAS, ARTERIAL  TROPONIN I    Imaging Review Dg Lumbar Spine Complete  02/06/2014   CLINICAL DATA:  low back pain post fall  EXAM: LUMBAR SPINE - COMPLETE 4+ VIEW  COMPARISON:  02/03/2014  FINDINGS: Prior interbody fusion at L5-S1 is again seen. The lateral pedicular screws on the left are noted with posterior fixation. Disc space narrowing is noted at L4-5 and stable. Compression deformity at L1 is again noted. No acute bony abnormality is noted. No hardware failure is seen.  IMPRESSION: Chronic changes and postoperative change. No acute abnormality is noted.   Electronically Signed   By: Alcide Clever M.D.   On: 02/06/2014 12:36   Dg Chest Port 1 View  02/07/2014   CLINICAL DATA:  Chest pain and difficulty breathing.  EXAM: PORTABLE CHEST - 1 VIEW  COMPARISON:  Chest x-ray 02/05/2014.  FINDINGS: Lung volumes are normal. No consolidative airspace disease. No pleural effusions. No pneumothorax. No pulmonary nodule or mass noted. Pulmonary vasculature and the cardiomediastinal silhouette are within normal limits.  IMPRESSION: No radiographic evidence of acute cardiopulmonary disease.   Electronically Signed   By: Trudie Reed M.D.   On: 02/07/2014 23:56     EKG Interpretation   Date/Time:  Thursday February 07 2014 21:32:56 EDT Ventricular Rate:  77 PR Interval:    QRS Duration: 95 QT  Interval:  420 QTC Calculation: 475 R Axis:   71 Text Interpretation:  Sinus rhythm Anterior infarct, old Baseline wander  in lead(s) V6 Confirmed by Charita Lindenberger  MD, Tayleigh Wetherell [phone]) on 02/08/2014  12:38:40 AM      MDM   Final diagnoses:  Respiratory arrest  Midline low back pain, with sciatica presence unspecified    Patient with possible cardiac arrest. In respiratory arrest. Some improvement after Narcan. Has had previous overdose. Drug screen reassuring. Has had previous syncope workups. With a possible cardiac arrest limited to internal medicine.    Juliet Rude. Rubin Payor, MD 02/08/14 [phone]

## 2014-02-08 NOTE — Progress Notes (Signed)
Pt calls RN to the room c/o pain stating her dilaudid is not helping her. MD paged and new oxycodone order put in by MD; pt also said she will come in to see pt. Will continue to monitor pt quietly. Arabella MerlesP. Amo Glorianne Proctor RN.

## 2014-02-08 NOTE — Progress Notes (Signed)
Hospitalist on call returned text page.  No new orders received.  RN will continue to monitor patient.

## 2014-02-08 NOTE — Progress Notes (Signed)
Utilization Review Completed.Dong Nimmons T8/14/2015  

## 2014-02-09 ENCOUNTER — Inpatient Hospital Stay (HOSPITAL_COMMUNITY): Payer: Managed Care, Other (non HMO)

## 2014-02-09 DIAGNOSIS — G8929 Other chronic pain: Secondary | ICD-10-CM

## 2014-02-09 DIAGNOSIS — M545 Low back pain, unspecified: Secondary | ICD-10-CM

## 2014-02-09 DIAGNOSIS — R404 Transient alteration of awareness: Secondary | ICD-10-CM

## 2014-02-09 DIAGNOSIS — F319 Bipolar disorder, unspecified: Secondary | ICD-10-CM

## 2014-02-09 LAB — CORTISOL
Cortisol, Plasma: 2 ug/dL
Cortisol, Plasma: 4.9 ug/dL

## 2014-02-09 MED ORDER — CLONAZEPAM 0.5 MG PO TABS
0.2500 mg | ORAL_TABLET | Freq: Two times a day (BID) | ORAL | Status: DC | PRN
  Administered 2014-02-09 – 2014-02-11 (×3): 0.25 mg via ORAL
  Filled 2014-02-09 (×4): qty 1

## 2014-02-09 MED ORDER — COSYNTROPIN 0.25 MG IJ SOLR
0.2500 mg | Freq: Once | INTRAMUSCULAR | Status: AC
  Administered 2014-02-10: 0.25 mg via INTRAVENOUS
  Filled 2014-02-09: qty 0.25

## 2014-02-09 MED ORDER — GADOBENATE DIMEGLUMINE 529 MG/ML IV SOLN
9.0000 mL | Freq: Once | INTRAVENOUS | Status: AC | PRN
  Administered 2014-02-09: 9 mL via INTRAVENOUS

## 2014-02-09 NOTE — Progress Notes (Signed)
[facility], gait belt and husband. Pt became unfocused, irregular shallow breathing with myself urging pt to breath. She did not lose consciousness, however, I had to work to get her to open her eyes. Pupils 2mm and reactive to light. Maximum Assistance back to bed. BP before episode- 160/100, after back to bed 172/99. Pt denies dizziness or lightheadedness during episode, but states that the room went dark. Pt instructed not to get OOB. Bed alarm on. Call bell in reach. Husband instructed to tell me when he leaves.

## 2014-02-09 NOTE — Progress Notes (Signed)
PROGRESS NOTE  Rachael FreezeRachel Ramirez ZOX:[id number] DOB: 08/08/1970 DOA: 02/07/2014 PCP: Kaleen MaskELKINS,WILSON OLIVER, MD  Assessment/Plan: Unresponsive episode  -As per the history provided by the EMS the patient was completely awake and oriented immediately after  given narcan  -troponins are negative, lactic acid level is negative, no leukocytosis hemoglobin stable.  -CT of the head is negative for any acute intracranial abnormality, chest x-ray does not show any evidence of pneumonia.  -CT of the chest was negative for any pulmonary embolism.  - telemetry monitoring, echocardiogram in the morning, serial troponin for further workup.   -cortisol low -check again this AM with ACTH-- stim test for tomm AM -patient had several episodes of unresponsiveness here but vitals and HR remained stable, no telemetry changes  Chronic back pain  Continue Lyrica and trazodone -limit IV pain medications MRI lumbar spine  3 mm nodule in the lateral segment of the right middle lobe. This is highly nonspecific, and statistically likely benign in this young patient. If the patient is at high risk for bronchogenic carcinoma, follow-up chest CT at 1 year is recommended. If the patient is at low risk, no follow-up is needed.     Code Status: full Family Communication: Disposition Plan:    Consultants:  none  Procedures:      HPI/Subjective: Not feeling well this AM No SOB, no CP  Objective: Filed Vitals:   02/09/14 0627  BP: 102/78  Pulse: 68  Temp: 97.8 F (36.6 C)  Resp: 16    Intake/Output Summary (Last 24 hours) at 02/09/14 0846 Last data filed at 02/09/14 0423  Gross per 24 hour  Intake    240 ml  Output   2225 ml  Net  -1985 ml   Filed Weights   02/08/14 0214 02/09/14 0627  Weight: 45.4 kg (100 lb 1.4 oz) 45.3 kg (99 lb 13.9 oz)    Exam: A+Ox3, more awake this AM rrr  Clear No c/c/e   Data Reviewed: Basic Metabolic Panel:  Recent Labs Lab 02/03/14 2033 02/05/14 1710  02/06/14 1116 02/07/14 2153  NA 143 145 140 144  K 3.3* 3.0* 3.1* 3.8  CL 103 104 102 106  CO2 23  --   --  25  GLUCOSE 99 86 98 87  BUN 5* <3* <3* 4*  CREATININE 0.67 0.80 0.70 0.66  CALCIUM 9.1  --   --  8.3*   Liver Function Tests:  Recent Labs Lab 02/03/14 2033 02/07/14 2153  AST 14 23  ALT <5 8  ALKPHOS 112 87  BILITOT 0.2* 0.3  PROT 7.3 6.3  ALBUMIN 3.7 3.1*   No results found for this basename: LIPASE, AMYLASE,  in the last 168 hours No results found for this basename: AMMONIA,  in the last 168 hours CBC:  Recent Labs Lab 02/03/14 2033 02/05/14 1614 02/05/14 1710 02/06/14 1116 02/07/14 2229  WBC 8.9 5.9  --   --  7.0  NEUTROABS 5.7  --   --   --  3.7  HGB 14.3 14.6 16.7* 13.9 13.6  HCT 42.8 43.4 49.0* 41.0 40.4  MCV 97.7 95.6  --   --  96.2  PLT 363 373  --   --  305   Cardiac Enzymes:  Recent Labs Lab 02/03/14 2014 02/08/14 0013  TROPONINI <0.30 <0.30   BNP (last 3 results) No results found for this basename: PROBNP,  in the last 8760 hours CBG:  Recent Labs Lab 02/03/14 2030 02/05/14 1618 02/06/14 1101 02/08/14 0209  GLUCAP  104* 86 101* 94    Recent Results (from the past 240 hour(s))  MRSA PCR SCREENING     Status: None   Collection Time    02/08/14  2:19 AM      Result Value Ref Range Status   MRSA by PCR NEGATIVE  NEGATIVE Final   Comment:            The GeneXpert MRSA Assay (FDA     approved for NASAL specimens     only), is one component of a     comprehensive MRSA colonization     surveillance program. It is not     intended to diagnose MRSA     infection nor to guide or     monitor treatment for     MRSA infections.     Studies: Dg Lumbar Spine Complete  02/06/2014   CLINICAL DATA:  low back pain post fall  EXAM: LUMBAR SPINE - COMPLETE 4+ VIEW  COMPARISON:  02/03/2014  FINDINGS: Prior interbody fusion at L5-S1 is again seen. The lateral pedicular screws on the left are noted with posterior fixation. Disc space  narrowing is noted at L4-5 and stable. Compression deformity at L1 is again noted. No acute bony abnormality is noted. No hardware failure is seen.  IMPRESSION: Chronic changes and postoperative change. No acute abnormality is noted.   Electronically Signed   By: Alcide Clever M.D.   On: 02/06/2014 12:36   Ct Head Wo Contrast  02/08/2014   CLINICAL DATA:  Patient found down and unresponsive.  EXAM: CT HEAD WITHOUT CONTRAST  TECHNIQUE: Contiguous axial images were obtained from the base of the skull through the vertex without intravenous contrast.  COMPARISON:  Head CT 02/03/2014.  FINDINGS: No acute intracranial abnormalities. Specifically, no evidence of acute intracranial hemorrhage, no definite findings of acute/subacute cerebral ischemia, no mass, mass effect, hydrocephalus or abnormal intra or extra-axial fluid collections. Visualized paranasal sinuses and mastoids are well pneumatized. No acute displaced skull fractures are identified.  IMPRESSION: *No acute intracranial abnormalities. *The appearance of the brain is normal.   Electronically Signed   By: Trudie Reed M.D.   On: 02/08/2014 01:49   Ct Angio Chest Pe W/cm &/or Wo Cm  02/08/2014   CLINICAL DATA:  Mid chest pain.  Elevated D-dimer.  EXAM: CT ANGIOGRAPHY CHEST WITH CONTRAST  TECHNIQUE: Multidetector CT imaging of the chest was performed using the standard protocol during bolus administration of intravenous contrast. Multiplanar CT image reconstructions and MIPs were obtained to evaluate the vascular anatomy.  CONTRAST:  80mL OMNIPAQUE IOHEXOL 350 MG/ML SOLN  COMPARISON:  Chest CT 09/17/2013.  FINDINGS: Mediastinum: No filling defects within the pulmonary arterial tree to suggest underlying pulmonary embolism. Heart size is normal. There is no significant pericardial fluid, thickening or pericardial calcification. No pathologically enlarged mediastinal or hilar lymph nodes. Esophagus is unremarkable in appearance. Aberrant right subclavian  artery (normal anatomical variant) incidentally noted.  Lungs/Pleura: No acute consolidative airspace disease. No pleural effusions. 3 mm right middle lobe nodule (image 75 of series 506). No other suspicious appearing pulmonary nodules or masses.  Upper Abdomen: Unremarkable.  Musculoskeletal: There are no aggressive appearing lytic or blastic lesions noted in the visualized portions of the skeleton.  Review of the MIP images confirms the above findings.  IMPRESSION: 1. No evidence of pulmonary embolism. 2. No acute findings to account for the patient's symptoms. 3. 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 4. 3 mm nodule in the  lateral segment of the right middle lobe. This is highly nonspecific, and statistically likely benign in this young patient. If the patient is at high risk for bronchogenic carcinoma, follow-up chest CT at 1 year is recommended. If the patient is at low risk, no follow-up is needed. This recommendation follows the consensus statement: Guidelines for Management of Small Pulmonary Nodules Detected on CT Scans: A Statement from the Fleischner Society as published in Radiology 2005; 237:395-400.   Electronically Signed   By: Trudie Reed M.D.   On: 02/08/2014 02:29   Dg Chest Port 1 View  02/07/2014   CLINICAL DATA:  Chest pain and difficulty breathing.  EXAM: PORTABLE CHEST - 1 VIEW  COMPARISON:  Chest x-ray 02/05/2014.  FINDINGS: Lung volumes are normal. No consolidative airspace disease. No pleural effusions. No pneumothorax. No pulmonary nodule or mass noted. Pulmonary vasculature and the cardiomediastinal silhouette are within normal limits.  IMPRESSION: No radiographic evidence of acute cardiopulmonary disease.   Electronically Signed   By: Trudie Reed M.D.   On: 02/07/2014 23:56    Scheduled Meds: . [START ON 02/10/2014] cosyntropin  0.25 mg Intravenous Once  . feeding supplement (ENSURE COMPLETE)  237 mL Oral TID BM  . heparin   5,000 Units Subcutaneous 3 times per day  . lidocaine  1 patch Transdermal Q24H  . pantoprazole  40 mg Oral Daily  . pregabalin  300 mg Oral Daily  . sodium chloride  3 mL Intravenous Q12H  . traZODone  300 mg Oral QHS   Continuous Infusions: . sodium chloride 75 mL/hr at 02/08/14 1753   Antibiotics Given (last 72 hours)   None      Principal Problem:   Unresponsive episode Active Problems:   Bipolar disorder, unspecified   Chronic low back pain   History of suicide attempt   Depression   Lumbar herniated disc    Time spent: 35 min    Tyeshia Cornforth  [facility] Pager [phone]. If 7PM-7AM, please contact night-coverage at [email or web address], password [facility] 02/09/2014, 8:46 AM  LOS: 2 days

## 2014-02-09 NOTE — Progress Notes (Signed)
Pt is complaining of back pain unrelieved by current narcotics. She is upset at being made to stay in the bed which she says is making her back worse. I explained reasoning for securing her safety. She says that she feels stronger today. The patient and I formulated a plan to slowly increase her activity level as she tolerates. We will have 2 staff members with her and gait belt on and [facility]. If she tolerates this we will proceed to using the bathroom and the next level will be assisting her to walk in the hall. She is pleased with this plan. I offered to assist her up to the recliner to give her back a change of position but she declined.

## 2014-02-10 LAB — TSH: TSH: 1.98 u[IU]/mL (ref 0.350–4.500)

## 2014-02-10 LAB — ACTH STIMULATION, 3 TIME POINTS
CORTISOL BASE: 4 ug/dL
Cortisol, 30 Min: 14.7 ug/dL — ABNORMAL LOW (ref >20.0)
Cortisol, 60 Min: 23.1 ug/dL (ref >20)

## 2014-02-10 MED ORDER — TRAZODONE HCL 150 MG PO TABS
300.0000 mg | ORAL_TABLET | Freq: Every evening | ORAL | Status: DC | PRN
  Filled 2014-02-10: qty 2

## 2014-02-10 MED ORDER — TRAZODONE HCL 150 MG PO TABS
150.0000 mg | ORAL_TABLET | Freq: Every evening | ORAL | Status: DC | PRN
  Administered 2014-02-10: 150 mg via ORAL
  Filled 2014-02-10: qty 1

## 2014-02-10 NOTE — Progress Notes (Signed)
Pt requested to use BSC to void. She stataed that she feels well. Assisted to dangle on bedside, gait belt applied and pt allowed to adjust to sitting position. After about 3 minutes pt's head slumped over and pt became unresponsive. Assisted to lay back in bed with immediate recovery of consciousness. Pt crying, stating "I just don't understand". Upon questioning pt states that she did not feel dizzy or lightheaded prior to slumping over. She states that her vision went black and she heard a faint ringing in her ears. Assisted to use bedpan and pt reassured.

## 2014-02-10 NOTE — Progress Notes (Addendum)
PROGRESS NOTE  Cyndy FreezeRachel Reep WUJ:[id number] DOB: 09/15/1970 DOA: 02/07/2014 PCP: Kaleen MaskELKINS,WILSON OLIVER, MD  Patient was walking to get her mail and suddenly felt dizzy and had a blackening out sensation and passed out. Since the neighbor saw her falling and could not find a pulse started CPR and EMS was called. EMS found the patient was breathing shallow with pulse started her rescue breathing, inserted an I/O and given narcan and the patient was immediately awake and at her baseline without any confusion.  Continues to have "epsiodes" in the hospital as well  Assessment/Plan: Unresponsive episode  -As per the history provided by the EMS the patient was completely awake and oriented immediately after  given narcan - UDS negative in ER -troponins are negative, lactic acid level is negative, no leukocytosis hemoglobin stable.  -CT of the head is negative for any acute intracranial abnormality, chest x-ray does not show any evidence of pneumonia.  -CT of the chest was negative for any pulmonary embolism.  - telemetry monitoring, echocardiogram in the morning, serial troponin for further workup.   -cortisol lower end of normal (1 reading about 2) -check again this AM with ACTH-- stim test - lab unable to complete test -given steroid dose pack for back in ER recently- patient says she did not take -patient had several episodes of unresponsiveness here but vitals and HR remained stable, no telemetry changes -patient continues to have episodes when gotten up last PM- she has no recollection of there events  Chronic back pain  Continue Lyrica and trazodone-- decrease dose -limit IV pain medications MRI lumbar spine ok  3 mm nodule in the lateral segment of the right middle lobe. This is highly nonspecific, and statistically likely benign in this young patient. If the patient is at high risk for bronchogenic carcinoma, follow-up chest CT at 1 year is recommended. If the patient is at low risk, no  follow-up is needed.     Code Status: full Family Communication: Disposition Plan: PT eval- SNF??   Consultants:  none  Procedures:      HPI/Subjective: Asking to go home No SOB, no CP  Objective: Filed Vitals:   02/10/14 0525  BP: 114/90  Pulse: 67  Temp: 97.9 F (36.6 C)  Resp: 18    Intake/Output Summary (Last 24 hours) at 02/10/14 0845 Last data filed at 02/10/14 0540  Gross per 24 hour  Intake   1225 ml  Output    900 ml  Net    325 ml   Filed Weights   02/08/14 0214 02/09/14 0627  Weight: 45.4 kg (100 lb 1.4 oz) 45.3 kg (99 lb 13.9 oz)    Exam: A+Ox3, more awake this AM rrr  Clear No c/c/e   Data Reviewed: Basic Metabolic Panel:  Recent Labs Lab 02/03/14 2033 02/05/14 1710 02/06/14 1116 02/07/14 2153  NA 143 145 140 144  K 3.3* 3.0* 3.1* 3.8  CL 103 104 102 106  CO2 23  --   --  25  GLUCOSE 99 86 98 87  BUN 5* <3* <3* 4*  CREATININE 0.67 0.80 0.70 0.66  CALCIUM 9.1  --   --  8.3*   Liver Function Tests:  Recent Labs Lab 02/03/14 2033 02/07/14 2153  AST 14 23  ALT <5 8  ALKPHOS 112 87  BILITOT 0.2* 0.3  PROT 7.3 6.3  ALBUMIN 3.7 3.1*   No results found for this basename: LIPASE, AMYLASE,  in the last 168 hours No results found for this  basename: AMMONIA,  in the last 168 hours CBC:  Recent Labs Lab 02/03/14 2033 02/05/14 1614 02/05/14 1710 02/06/14 1116 02/07/14 2229  WBC 8.9 5.9  --   --  7.0  NEUTROABS 5.7  --   --   --  3.7  HGB 14.3 14.6 16.7* 13.9 13.6  HCT 42.8 43.4 49.0* 41.0 40.4  MCV 97.7 95.6  --   --  96.2  PLT 363 373  --   --  305   Cardiac Enzymes:  Recent Labs Lab 02/03/14 2014 02/08/14 0013  TROPONINI <0.30 <0.30   BNP (last 3 results) No results found for this basename: PROBNP,  in the last 8760 hours CBG:  Recent Labs Lab 02/03/14 2030 02/05/14 1618 02/06/14 1101 02/08/14 0209  GLUCAP 104* 86 101* 94    Recent Results (from the past 240 hour(s))  MRSA PCR SCREENING      Status: None   Collection Time    02/08/14  2:19 AM      Result Value Ref Range Status   MRSA by PCR NEGATIVE  NEGATIVE Final   Comment:            The GeneXpert MRSA Assay (FDA     approved for NASAL specimens     only), is one component of a     comprehensive MRSA colonization     surveillance program. It is not     intended to diagnose MRSA     infection nor to guide or     monitor treatment for     MRSA infections.     Studies: Dg Lumbar Spine Complete  02/06/2014   CLINICAL DATA:  low back pain post fall  EXAM: LUMBAR SPINE - COMPLETE 4+ VIEW  COMPARISON:  02/03/2014  FINDINGS: Prior interbody fusion at L5-S1 is again seen. The lateral pedicular screws on the left are noted with posterior fixation. Disc space narrowing is noted at L4-5 and stable. Compression deformity at L1 is again noted. No acute bony abnormality is noted. No hardware failure is seen.  IMPRESSION: Chronic changes and postoperative change. No acute abnormality is noted.   Electronically Signed   By: Alcide Clever M.D.   On: 02/06/2014 12:36   Ct Head Wo Contrast  02/08/2014   CLINICAL DATA:  Patient found down and unresponsive.  EXAM: CT HEAD WITHOUT CONTRAST  TECHNIQUE: Contiguous axial images were obtained from the base of the skull through the vertex without intravenous contrast.  COMPARISON:  Head CT 02/03/2014.  FINDINGS: No acute intracranial abnormalities. Specifically, no evidence of acute intracranial hemorrhage, no definite findings of acute/subacute cerebral ischemia, no mass, mass effect, hydrocephalus or abnormal intra or extra-axial fluid collections. Visualized paranasal sinuses and mastoids are well pneumatized. No acute displaced skull fractures are identified.  IMPRESSION: *No acute intracranial abnormalities. *The appearance of the brain is normal.   Electronically Signed   By: Trudie Reed M.D.   On: 02/08/2014 01:49   Ct Angio Chest Pe W/cm &/or Wo Cm  02/08/2014   CLINICAL DATA:  Mid chest  pain.  Elevated D-dimer.  EXAM: CT ANGIOGRAPHY CHEST WITH CONTRAST  TECHNIQUE: Multidetector CT imaging of the chest was performed using the standard protocol during bolus administration of intravenous contrast. Multiplanar CT image reconstructions and MIPs were obtained to evaluate the vascular anatomy.  CONTRAST:  80mL OMNIPAQUE IOHEXOL 350 MG/ML SOLN  COMPARISON:  Chest CT 09/17/2013.  FINDINGS: Mediastinum: No filling defects within the pulmonary arterial tree to suggest underlying pulmonary  embolism. Heart size is normal. There is no significant pericardial fluid, thickening or pericardial calcification. No pathologically enlarged mediastinal or hilar lymph nodes. Esophagus is unremarkable in appearance. 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  Lungs/Pleura: No acute consolidative airspace disease. No pleural effusions. 3 mm right middle lobe nodule (image 75 of series 506). No other suspicious appearing pulmonary nodules or masses.  Upper Abdomen: Unremarkable.  Musculoskeletal: There are no aggressive appearing lytic or blastic lesions noted in the visualized portions of the skeleton.  Review of the MIP images confirms the above findings.  IMPRESSION: 1. No evidence of pulmonary embolism. 2. No acute findings to account for the patient's symptoms. 3. 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 4. 3 mm nodule in the lateral segment of the right middle lobe. This is highly nonspecific, and statistically likely benign in this young patient. If the patient is at high risk for bronchogenic carcinoma, follow-up chest CT at 1 year is recommended. If the patient is at low risk, no follow-up is needed. This recommendation follows the consensus statement: Guidelines for Management of Small Pulmonary Nodules Detected on CT Scans: A Statement from the Fleischner Society as published in Radiology 2005; 237:395-400.   Electronically Signed   By:  Trudie Reed M.D.   On: 02/08/2014 02:29   Dg Chest Port 1 View  02/07/2014   CLINICAL DATA:  Chest pain and difficulty breathing.  EXAM: PORTABLE CHEST - 1 VIEW  COMPARISON:  Chest x-ray 02/05/2014.  FINDINGS: Lung volumes are normal. No consolidative airspace disease. No pleural effusions. No pneumothorax. No pulmonary nodule or mass noted. Pulmonary vasculature and the cardiomediastinal silhouette are within normal limits.  IMPRESSION: No radiographic evidence of acute cardiopulmonary disease.   Electronically Signed   By: Trudie Reed M.D.   On: 02/07/2014 23:56    Scheduled Meds: . cosyntropin  0.25 mg Intravenous Once  . feeding supplement (ENSURE COMPLETE)  237 mL Oral TID BM  . heparin  5,000 Units Subcutaneous 3 times per day  . lidocaine  1 patch Transdermal Q24H  . pantoprazole  40 mg Oral Daily  . pregabalin  300 mg Oral Daily  . sodium chloride  3 mL Intravenous Q12H   Continuous Infusions: . sodium chloride 75 mL/hr at 02/10/14 0240   Antibiotics Given (last 72 hours)   None      Principal Problem:   Unresponsive episode Active Problems:   Bipolar disorder, unspecified   Chronic low back pain   History of suicide attempt   Depression   Lumbar herniated disc    Time spent: 25 min    Keyona Emrich  [facility] Pager [phone]. If 7PM-7AM, please contact night-coverage at [email or web address], password [facility] 02/10/2014, 8:45 AM  LOS: 3 days

## 2014-02-10 NOTE — Plan of Care (Signed)
Problem: Phase I Progression Outcomes Goal: OOB as tolerated unless otherwise ordered Outcome: Not Met (add Reason) Pt not tolerating out of bed at all due to near syncope.

## 2014-02-10 NOTE — Progress Notes (Addendum)
Pt sent husband home stating that he was "stressing me out". She states that now she is nauseated due to the stress and her back pain will not let her sleep.  She is insistent on IV pain med. Pt promised her husband before he left that she would not get out of the bed on her own.

## 2014-02-10 NOTE — Progress Notes (Signed)
Bed alarm sounded, pt found getting OOB to bedside chair. She stated that she was getting her clothes on and leaving. She appeared groggy and states "I'm confused". Pt had to be talked into going back to bed. I called her husband who talked to her on the phone and he came in to stay with pt. She is still asking to get dressed and go home. She states that she does not remember [facility].

## 2014-02-11 LAB — AMMONIA: Ammonia: 37 umol/L (ref 11–60)

## 2014-02-11 LAB — CK: CK TOTAL: 34 U/L (ref 7–177)

## 2014-02-11 LAB — VITAMIN B12: Vitamin B-12: 337 pg/mL (ref 211–911)

## 2014-02-11 MED ORDER — TRAZODONE HCL 50 MG PO TABS
50.0000 mg | ORAL_TABLET | Freq: Every evening | ORAL | Status: DC | PRN
  Administered 2014-02-11: 50 mg via ORAL
  Filled 2014-02-11 (×2): qty 1

## 2014-02-11 MED ORDER — PREGABALIN 75 MG PO CAPS: 300.0000 mg | ORAL_CAPSULE | Freq: Every day | ORAL | Status: DC

## 2014-02-11 NOTE — Progress Notes (Signed)
Physical Therapy Treatment Patient Details Name: Rachael Ramirez MRN: [id number] DOB: 02/03/1971 Today's Date: 02/11/2014    History of Present Illness 43 yo female presenting with multiple incidence of syncope and unresposiveness.     PT Comments    Pt progressing but not yet safe to d/c home. Pt ambulated 35' but required HH min A and had an episode of nearly falling off bed forwards after ambulation. Pt unhappy with the recommendation to stay another day but verbalized understanding of risk of falling at home. PT will continue to follow.   Follow Up Recommendations  Supervision/Assistance - 24 hour;Home health PT     Equipment Recommendations  None recommended by PT    Recommendations for Other Services       Precautions / Restrictions Precautions Precautions: Fall Restrictions Weight Bearing Restrictions: No    Mobility  Bed Mobility               General bed mobility comments: pt received sitting EOB, speaking with physician  Transfers Overall transfer level: Needs assistance Equipment used: None Transfers: Sit to/from Stand Sit to Stand: +2 safety/equipment;Min assist         General transfer comment: pt unsteady with standing, looking at floor despite vc's to look up, bilateral knees in flexion, swaying though denying dizziness  Ambulation/Gait Ambulation/Gait assistance: Min assist Ambulation Distance (Feet): 35 Feet Assistive device: 1 person hand held assist Gait Pattern/deviations: Step-through pattern;Staggering left;Staggering right Gait velocity: decreased Gait velocity interpretation: Below normal speed for age/gender General Gait Details: pt with very slow, staggering gait, requiring HHA from therapist to begin ambulation. Pt looked down at floor throughout despite vc's, would not/ could not verbalize with PT during ambulation. LOB with turning around with min A to correct. Unsafe with ambulation   Stairs            Wheelchair Mobility     Modified Rankin (Stroke Patients Only)       Balance Overall balance assessment: Needs assistance Sitting-balance support: Bilateral upper extremity supported;Feet supported Sitting balance-Leahy Scale: Fair Sitting balance - Comments: pt was sitting EOB when suddenly lurched fwd almost falling off EOB on her head, PT and MD reached out to stabilize pt and pt downplayed event saying she will be fine when she gets home. Pt could not verbalize what had happened to cause this. Postural control:  (fwd lean) Standing balance support: Single extremity supported;During functional activity Standing balance-Leahy Scale: Poor Standing balance comment: needs UE support to maintain standing and cannot consistently maintain even then                    Cognition Arousal/Alertness: Awake/alert Behavior During Therapy: Flat affect Overall Cognitive Status: Impaired/Different from baseline Area of Impairment: Following commands       Following Commands: Follows one step commands inconsistently       General Comments: pt does not follow all instructional cues or respond directly to commands, unclear whether this is a cognitive issue or is intentional    Exercises      General Comments General comments (skin integrity, edema, etc.): at this point, pt does not demonstrate safety with mobility to go home, even if she goe4s to her mother's house with supervision as her mother has MS, cannot assist physically.       Pertinent Vitals/Pain Pain Assessment: Faces Faces Pain Scale: Hurts little more Pain Location: back Pain Intervention(s): Monitored during session    Home Living  Prior Function            PT Goals (current goals can now be found in the care plan section) Acute Rehab PT Goals Patient Stated Goal: to stop "falling out" PT Goal Formulation: With patient/family Time For Goal Achievement: 02/22/14 Potential to Achieve Goals:  Fair Progress towards PT goals: Progressing toward goals    Frequency  Min 3X/week    PT Plan Current plan remains appropriate    Co-evaluation             End of Session Equipment Utilized During Treatment: Gait belt Activity Tolerance: Patient tolerated treatment well Patient left: in bed;with call bell/phone within reach;with bed alarm set;with nursing/sitter in room     Time: 1425-1445 PT Time Calculation (min): 20 min  Charges:  $Gait Training: 8-22 mins                    G Codes:     Lyanne Co, PT  Acute Rehab Services  [phone]  Lyanne Co 02/11/2014, 3:06 PM

## 2014-02-11 NOTE — Plan of Care (Signed)
Problem: Phase II Progression Outcomes Goal: Progress activity as tolerated unless otherwise ordered Outcome: Not Met (add Reason) Pt unable to tolerate being upright and out of bed.

## 2014-02-11 NOTE — Progress Notes (Signed)
PROGRESS NOTE  Rachael Ramirez:[id number] DOB: [birth year] DOA: 02/07/2014 PCP: [name], MD  Chart reviewed.  Patient was walking to get her mail and suddenly felt dizzy and had a blackening out sensation and passed out. Since the neighbor saw her falling and could not find a pulse started CPR and EMS was called. EMS found the patient was breathing shallow with pulse started her rescue breathing, inserted an I/O and given narcan and the patient was immediately awake and at her baseline without any confusion.  Continues to have "epsiodes" in the hospital as well  Assessment/Plan: Unresponsive episode, recurrent -As per the history provided by the EMS the patient was completely awake and oriented immediately after  given narcan - UDS negative in ER -troponins are negative, lactic acid level is negative, no leukocytosis hemoglobin stable.  -CT of the head is negative for any acute intracranial abnormality, chest x-ray does not show any evidence of pneumonia.  -CT of the chest was negative for any pulmonary embolism.  - telemetry monitoring, echocardiogram carotid dopplers ok Cosyntropin stim normal -given steroid dose pack for back in ER recently- patient says she did not take -patient had several episodes of unresponsiveness here but vitals and HR remained stable, no telemetry changes -patient continues to have episodes when gotten up last PM- she has no recollection of there events Had orthostatic drop in BP from 170 to 110 systolic. May be contributing. Decrease trazodone. Place ted hose. Increase IVF. I walked with pt and PT. Unsteady and slow, but ambulated to nursing station. Then nearly fell forward while sitting at edge of bed. Not yet ready for discharge. Check B12, folate, NH3. If symptoms continue may need neuro consult. If leaves, must leave ama  Chronic back pain  Continue Lyrica and trazodone-- decrease dose. Change lyrica to qhs -limit IV pain medications MRI lumbar  spine ok  3 mm nodule in the lateral segment of the right middle lobe. This is highly nonspecific, and statistically likely benign in this young patient. If the patient is at high risk for bronchogenic carcinoma, follow-up chest CT at 1 year is recommended. If the patient is at low risk, no follow-up is needed.  RN reports poor oral intake. May be contributing. Also has been largely immobile in hospital and at home, so likely deconditioning is contributing  Code Status: full Family Communication: Disposition Plan:   Consultants:  none  Procedures:      HPI/Subjective: Asking to go home  Objective: Filed Vitals:   02/11/14 1112  BP: 150/93  Pulse: 62  Temp: 97.7 F (36.5 C)  Resp: 18    Intake/Output Summary (Last 24 hours) at 02/11/14 1458 Last data filed at 02/11/14 0730  Gross per 24 hour  Intake   2540 ml  Output   2150 ml  Net    390 ml   Filed Weights   02/08/14 0214 02/09/14 0627 02/11/14 0444  Weight: 45.4 kg (100 lb 1.4 oz) 45.3 kg (99 lb 13.9 oz) 45.6 kg (100 lb 8.5 oz)    Exam: A+Ox3,  rrr without MGR Clear without WRR No c/c/e Neuro: nonfocal Psych: sad affect   Data Reviewed: Basic Metabolic Panel:  Recent Labs Lab 02/05/14 1710 02/06/14 1116 02/07/14 2153  NA 145 140 144  K 3.0* 3.1* 3.8  CL 104 102 106  CO2  --   --  25  GLUCOSE 86 98 87  BUN <3* <3* 4*  CREATININE 0.80 0.70 0.66  CALCIUM  --   --  8.3*   Liver Function Tests:  Recent Labs Lab 02/07/14 2153  AST 23  ALT 8  ALKPHOS 87  BILITOT 0.3  PROT 6.3  ALBUMIN 3.1*   No results found for this basename: LIPASE, AMYLASE,  in the last 168 hours No results found for this basename: AMMONIA,  in the last 168 hours CBC:  Recent Labs Lab 02/05/14 1614 02/05/14 1710 02/06/14 1116 02/07/14 2229  WBC 5.9  --   --  7.0  NEUTROABS  --   --   --  3.7  HGB 14.6 16.7* 13.9 13.6  HCT 43.4 49.0* 41.0 40.4  MCV 95.6  --   --  96.2  PLT 373  --   --  305   Cardiac  Enzymes:  Recent Labs Lab 02/08/14 0013  TROPONINI <0.30   BNP (last 3 results) No results found for this basename: PROBNP,  in the last 8760 hours CBG:  Recent Labs Lab 02/05/14 1618 02/06/14 1101 02/08/14 0209  GLUCAP 86 101* 94    Recent Results (from the past 240 hour(s))  MRSA PCR SCREENING     Status: None   Collection Time    02/08/14  2:19 AM      Result Value Ref Range Status   MRSA by PCR NEGATIVE  NEGATIVE Final   Comment:            The GeneXpert MRSA Assay (FDA     approved for NASAL specimens     only), is one component of a     comprehensive MRSA colonization     surveillance program. It is not     intended to diagnose MRSA     infection nor to guide or     monitor treatment for     MRSA infections.     Studies: Dg Lumbar Spine Complete  02/06/2014   CLINICAL DATA:  low back pain post fall  EXAM: LUMBAR SPINE - COMPLETE 4+ VIEW  COMPARISON:  02/03/2014  FINDINGS: Prior interbody fusion at L5-S1 is again seen. The lateral pedicular screws on the left are noted with posterior fixation. Disc space narrowing is noted at L4-5 and stable. Compression deformity at L1 is again noted. No acute bony abnormality is noted. No hardware failure is seen.  IMPRESSION: Chronic changes and postoperative change. No acute abnormality is noted.   Electronically Signed   By: Alcide Clever M.D.   On: 02/06/2014 12:36   Ct Head Wo Contrast  02/08/2014   CLINICAL DATA:  Patient found down and unresponsive.  EXAM: CT HEAD WITHOUT CONTRAST  TECHNIQUE: Contiguous axial images were obtained from the base of the skull through the vertex without intravenous contrast.  COMPARISON:  Head CT 02/03/2014.  FINDINGS: No acute intracranial abnormalities. Specifically, no evidence of acute intracranial hemorrhage, no definite findings of acute/subacute cerebral ischemia, no mass, mass effect, hydrocephalus or abnormal intra or extra-axial fluid collections. Visualized paranasal sinuses and mastoids  are well pneumatized. No acute displaced skull fractures are identified.  IMPRESSION: *No acute intracranial abnormalities. *The appearance of the brain is normal.   Electronically Signed   By: Trudie Reed M.D.   On: 02/08/2014 01:49   Ct Angio Chest Pe W/cm &/or Wo Cm  02/08/2014   CLINICAL DATA:  Mid chest pain.  Elevated D-dimer.  EXAM: CT ANGIOGRAPHY CHEST WITH CONTRAST  TECHNIQUE: Multidetector CT imaging of the chest was performed using the standard protocol during bolus administration of intravenous contrast. Multiplanar CT image reconstructions and MIPs  were obtained to evaluate the vascular anatomy.  CONTRAST:  80mL OMNIPAQUE IOHEXOL 350 MG/ML SOLN  COMPARISON:  Chest CT 09/17/2013.  FINDINGS: Mediastinum: No filling defects within the pulmonary arterial tree to suggest underlying pulmonary embolism. Heart size is normal. There is no significant pericardial fluid, thickening or pericardial calcification. No pathologically enlarged mediastinal or hilar lymph nodes. Esophagus is unremarkable in appearance. 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  Lungs/Pleura: No acute consolidative airspace disease. No pleural effusions. 3 mm right middle lobe nodule (image 75 of series 506). No other suspicious appearing pulmonary nodules or masses.  Upper Abdomen: Unremarkable.  Musculoskeletal: There are no aggressive appearing lytic or blastic lesions noted in the visualized portions of the skeleton.  Review of the MIP images confirms the above findings.  IMPRESSION: 1. No evidence of pulmonary embolism. 2. No acute findings to account for the patient's symptoms. 3. 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 4. 3 mm nodule in the lateral segment of the right middle lobe. This is highly nonspecific, and statistically likely benign in this young patient. If the patient is at high risk for bronchogenic carcinoma, follow-up chest CT at 1  year is recommended. If the patient is at low risk, no follow-up is needed. This recommendation follows the consensus statement: Guidelines for Management of Small Pulmonary Nodules Detected on CT Scans: A Statement from the Fleischner Society as published in Radiology 2005; 237:395-400.   Electronically Signed   By: Trudie Reedaniel  Entrikin M.D.   On: 02/08/2014 02:29   Dg Chest Port 1 View  02/07/2014   CLINICAL DATA:  Chest pain and difficulty breathing.  EXAM: PORTABLE CHEST - 1 VIEW  COMPARISON:  Chest x-ray 02/05/2014.  FINDINGS: Lung volumes are normal. No consolidative airspace disease. No pleural effusions. No pneumothorax. No pulmonary nodule or mass noted. Pulmonary vasculature and the cardiomediastinal silhouette are within normal limits.  IMPRESSION: No radiographic evidence of acute cardiopulmonary disease.   Electronically Signed   By: Trudie Reedaniel  Entrikin M.D.   On: 02/07/2014 23:56    Scheduled Meds: . feeding supplement (ENSURE COMPLETE)  237 mL Oral TID BM  . heparin  5,000 Units Subcutaneous 3 times per day  . lidocaine  1 patch Transdermal Q24H  . pantoprazole  40 mg Oral Daily  . [START ON 02/12/2014] pregabalin  300 mg Oral QHS  . sodium chloride  3 mL Intravenous Q12H   Continuous Infusions: . sodium chloride 75 mL/hr at 02/11/14 0549   Antibiotics Given (last 72 hours)   None     Time spent: 35 min  Alphonzo Devera L  [facility] Pager [phone]. If 7PM-7AM, please contact night-coverage at [email or web address], password [email or web address] 02/11/2014, 2:58 PM  LOS: 4 days

## 2014-02-11 NOTE — Progress Notes (Signed)
PT Cancellation Note  Patient Details Name: [name] MRN: [id number] DOB: 04/20/1971   Cancelled Treatment:    Reason Eval/Treat Not Completed: Medical issues which prohibited therapy, given frequency of syncopal episodes yesterday (and they have continued this morning), will hold PT today. Spoke with nsg who was in agreement.    Antonie Borjon, [place] 02/11/2014, 12:08 PM

## 2014-02-11 NOTE — Progress Notes (Signed)
Pt assisted to bedside commode with the assistance of 2 nurses, pt was very wobbly and weak but remained alert and conscience, pt was able to return back to bed after commode use, pt stated she did not feel dizzy or like she was going to pass out, pt instructed to not get out of bed without assistance or staff present, bed alarm on, will continue to monitor pt Archie BalboaStein, Arkeem Harts G, RN

## 2014-02-12 ENCOUNTER — Ambulatory Visit: Payer: Managed Care, Other (non HMO) | Admitting: Neurology

## 2014-02-12 LAB — FOLATE RBC: RBC Folate: 841 ng/mL — ABNORMAL HIGH (ref >280)

## 2014-02-12 MED ORDER — TRAZODONE HCL 150 MG PO TABS: 75.0000 mg | ORAL_TABLET | Freq: Every evening | ORAL | Status: DC | PRN

## 2014-02-12 MED ORDER — PREGABALIN 300 MG PO CAPS: 300.0000 mg | ORAL_CAPSULE | Freq: Every day | ORAL | Status: DC

## 2014-02-12 NOTE — Care Management Note (Signed)
    Page 1 of 1   02/12/2014     3:04:20 PM CARE MANAGEMENT NOTE 02/12/2014  Patient:  Cyndy FreezeWEST,Mliss   Account Number:  [id number]  Date Initiated:  02/08/2014  Documentation initiated by:  [facility]  Subjective/Objective Assessment:   pain, syncopal episodes     Action/Plan:   Anticipated DC Date:  02/12/2014   Anticipated DC Plan:  HOME W HOME HEALTH SERVICES      DC Planning Services  CM consult      [facility]   Choice offered to / List presented to:  C-1 Patient        HH arranged  HH-2 PT      [facility]   Status of service:  Completed, signed off Medicare Important Message given?   (If response is "NO", the following Medicare IM given date fields will be blank) Date Medicare IM given:   Medicare IM given by:   Date Additional Medicare IM given:   Additional Medicare IM given by:    Discharge Disposition:  HOME W HOME HEALTH SERVICES  Per UR Regulation:  Reviewed for med. necessity/level of care/duration of stay  If discussed at Long Length of Stay Meetings, dates discussed:    Comments:  02/12/14 Sidney AceJulie Kjersten Orm[place], RN, BSN [phone] Pt for dc home today; will need HH follow up, per PT recommendations.  [facility].  [facility]14[facility]

## 2014-02-12 NOTE — Progress Notes (Signed)
Pt discharged per MD order and protocol. Discharge instructions reviewed with patient and all questions answered. Pt given all prescriptions and aware of follow up appointments. Pt being discharged home with husband.

## 2014-02-12 NOTE — Discharge Summary (Signed)
Physician Discharge Summary  Rachael Ramirez WJX:[id number] DOB: 1971-04-02 DOA: 02/07/2014  PCP: [name], MD  Admit date: 02/07/2014 Discharge date: 02/12/2014  Time spent: greater than 30 minutes  Recommendations for Outpatient Follow-up:  1. Home PT arranged  Discharge Diagnoses:  Principal Problem:   Unresponsive episode Active Problems:   Bipolar disorder, unspecified   Chronic low back pain   History of suicide attempt   Depression   Gait difficulty orthostatic hypotension  Discharge Condition:   Filed Weights   02/09/14 0627 02/11/14 0444 02/12/14 0610  Weight: 45.3 kg (99 lb 13.9 oz) 45.6 kg (100 lb 8.5 oz) 45.4 kg (100 lb 1.4 oz)    History of present illness:  43 y.o. female with Past medical history of chronic low back pain status post laminectomy with residue radiculopathy, anxiety, depression, GERD.  The patient presented with complaints of unresponsiveness. Patient was awake and alert at the time of my evaluation complaining of back pain and close to her baseline.  She mentions that she was walking to get her mail and suddenly felt dizzy and had a blackening out sensation and passed out. Since the neighbor saw her falling and could not find a pulse started CPR and EMS was called. EMS found the patient was breathing shallow with pulse started her rescue breathing, inserted an I/O and the patient was immediately awake and at her baseline without any confusion. She will also tiredness and fatigue.  At the time of my evaluation she denies any headache, blurring of her vision, choking episode, dizziness or lightheadedness lying down, chest pain, palpitation, nausea, vomiting, or any new focal deficit.  She has been presented to ER multiple times with complaints of dizziness and lightheadedness with near-syncope in the last 1 week. One week ago she was not having any dizziness or lightheadedness.  She has seen her neurologist 2 months ago due to persistent low back  pain who has scheduled her for an outpatient MRI today.  No recent change in her medications.   Hospital Course:  Had multiple syncopal/near syncopal episodes in the hospital with NSR on telemetry.  Extensive workup negative except orthostatic drop in blood pressure from 170/112 systolic. CT, MRI brain and spine, cosyntropin stimulation test, electrolytes, echo, carotid doppler.   trazadone and gabapentin decreased. Patient had drug seeking behavior. May be contributing.  After hydration, compression stockings and PT, syncopal episodes resolved.  Procedures:  none  Consultations:  none  Discharge Exam: Filed Vitals:   02/12/14 0610  BP:   Pulse: 67  Temp: 98.2 F (36.8 C)  Resp: 18    General: alert, oriented. cooperative Cardiovascular: RRR Respiratory: CTA  Discharge Instructions   Activity as tolerated - No restrictions    Complete by:  As directed      Diet general    Complete by:  As directed      Discharge instructions    Complete by:  As directed   Wear compression stockings when ambulating            Medication List         albuterol-ipratropium 18-103 MCG/ACT inhaler  Commonly known as:  COMBIVENT  Inhale 1 puff into the lungs every 6 (six) hours as needed for wheezing or shortness of breath. For wheezing     aspirin-acetaminophen-caffeine 250-250-65 MG per tablet  Commonly known as:  EXCEDRIN MIGRAINE  Take 2 tablets by mouth every 6 (six) hours as needed for headache.     HYDROcodone-acetaminophen 10-325 MG per tablet  Commonly known as:  NORCO  Take 1 tablet by mouth every 6 (six) hours as needed for moderate pain.     omeprazole 20 MG capsule  Commonly known as:  PRILOSEC  Take 20 mg by mouth daily.     ondansetron 8 MG tablet  Commonly known as:  ZOFRAN  Take 8 mg by mouth every 6 (six) hours as needed for vomiting.     oxyCODONE 5 MG immediate release tablet  Commonly known as:  ROXICODONE  Take 1 tablet (5 mg total) by mouth every 4  (four) hours as needed for severe pain.     pregabalin 300 MG capsule  Commonly known as:  LYRICA  Take 1 capsule (300 mg total) by mouth at bedtime.     traZODone 150 MG tablet  Commonly known as:  DESYREL  Take 0.5 tablets (75 mg total) by mouth at bedtime as needed for sleep.       Allergies  Allergen Reactions  . Minocycline Nausea And Vomiting       Follow-up Information   Follow up with [name], MD.   Specialty:  [facility]   Contact information:   [address] [place] [address] [phone]        The results of significant diagnostics from this hospitalization (including imaging, microbiology, ancillary and laboratory) are listed below for reference.    Significant Diagnostic Studies:  Echo Left ventricle: The cavity size was normal. Wall thickness was normal. Systolic function was normal. The estimated ejection fraction was in the range of 55% to 60%. Wall motion was normal; there were no regional wall motion abnormalities. Left ventricular diastolic function parameters were normal.  Dg Chest 1 View  02/03/2014   CLINICAL DATA:  Syncope.  EXAM: CHEST - 1 VIEW  COMPARISON:  09/20/2013.  FINDINGS: The heart size and mediastinal contours are within normal limits. Both lungs are clear. The visualized skeletal structures are unremarkable. Compared with priors, the aeration is improved with clearing of LEFT base volume loss.  IMPRESSION: No active disease.   Electronically Signed   By: Davonna Belling M.D.   On: 02/03/2014 21:14   Dg Chest 2 View  02/05/2014   CLINICAL DATA:  LOSS OF CONSCIOUSNESS  EXAM: CHEST  2 VIEW  COMPARISON:  Prior radiograph from 02/04/2014  FINDINGS: The cardiac and mediastinal silhouettes are stable in size and contour, and remain within normal limits.  The lungs are normally inflated. No airspace consolidation, pleural effusion, or pulmonary edema is identified. There is no pneumothorax.  Anterior wedging deformity  in in the lumbar spine is stable from prior. No acute osseous abnormality  IMPRESSION: No active cardiopulmonary disease.   Electronically Signed   By: Rise Mu M.D.   On: 02/05/2014 17:27   Dg Lumbar Spine Complete  02/06/2014   CLINICAL DATA:  low back pain post fall  EXAM: LUMBAR SPINE - COMPLETE 4+ VIEW  COMPARISON:  02/03/2014  FINDINGS: Prior interbody fusion at L5-S1 is again seen. The lateral pedicular screws on the left are noted with posterior fixation. Disc space narrowing is noted at L4-5 and stable. Compression deformity at L1 is again noted. No acute bony abnormality is noted. No hardware failure is seen.  IMPRESSION: Chronic changes and postoperative change. No acute abnormality is noted.   Electronically Signed   By: Alcide Clever M.D.   On: 02/06/2014 12:36   Dg Lumbar Spine Complete  02/03/2014   CLINICAL DATA:  Low back pain.  EXAM: LUMBAR SPINE - COMPLETE 4+ VIEW  COMPARISON:  Multiple priors, with 2D lumbar spine of 07/05/2013 compared.  FINDINGS: There has been previous lumbar fusion at L5-S1. Subsequent to this there appears to of and lumbar discectomy at L4-5. There is marked asymmetric loss of interspace height at L4-5 on the LEFT which has developed since January. This may relate to discectomy and adjacent segment disease. No worrisome osseous lesions. Hardware grossly intact.  IMPRESSION: L5-S1 fusion appears uncomplicated. Asymmetric loss of interspace height at L4-5 on the LEFT.   Electronically Signed   By: Davonna Belling M.D.   On: 02/03/2014 21:17   Ct Head Wo Contrast  02/08/2014   CLINICAL DATA:  Patient found down and unresponsive.  EXAM: CT HEAD WITHOUT CONTRAST  TECHNIQUE: Contiguous axial images were obtained from the base of the skull through the vertex without intravenous contrast.  COMPARISON:  Head CT 02/03/2014.  FINDINGS: No acute intracranial abnormalities. Specifically, no evidence of acute intracranial hemorrhage, no definite findings of acute/subacute  cerebral ischemia, no mass, mass effect, hydrocephalus or abnormal intra or extra-axial fluid collections. Visualized paranasal sinuses and mastoids are well pneumatized. No acute displaced skull fractures are identified.  IMPRESSION: *No acute intracranial abnormalities. *The appearance of the brain is normal.   Electronically Signed   By: Trudie Reed M.D.   On: 02/08/2014 01:49   Ct Head Wo Contrast  02/03/2014   CLINICAL DATA:  Fall. Possible syncopal episode. Headache. Neck pain.  EXAM: CT HEAD WITHOUT CONTRAST  CT CERVICAL SPINE WITHOUT CONTRAST  TECHNIQUE: Multidetector CT imaging of the head and cervical spine was performed following the standard protocol without intravenous contrast. Multiplanar CT image reconstructions of the cervical spine were also generated.  COMPARISON:  02/23/2013  FINDINGS: CT HEAD FINDINGS  No evidence for acute infarction, hemorrhage, mass lesion, hydrocephalus, or extra-axial fluid. No atrophy or white matter disease. Intact calvarium. No acute sinus or mastoid disease.  CT CERVICAL SPINE FINDINGS  There is no visible cervical spine fracture, traumatic subluxation, prevertebral soft tissue swelling, or intraspinal hematoma. Prior cervical fusion at C5-C6 appears solid. Airway midline. No neck masses. No lung apex lesion.  There is moderate atherosclerotic plaque at the LEFT internal carotid artery origin, premature for the patient's age. It is unclear if the patient could have a symptomatic carotid stenosis. Recommend elective carotid Dopplers in the outpatient setting.  IMPRESSION: No skull fracture or intracranial hemorrhage. Similar appearance to priors.  No cervical spine fracture or traumatic subluxation.  LEFT internal carotid artery origin atherosclerosis. Consider outpatient evaluation with carotid Dopplers.   Electronically Signed   By: Davonna Belling M.D.   On: 02/03/2014 21:00   Ct Angio Chest Pe W/cm &/or Wo Cm  02/08/2014   CLINICAL DATA:  Mid chest pain.   Elevated D-dimer.  EXAM: CT ANGIOGRAPHY CHEST WITH CONTRAST  TECHNIQUE: Multidetector CT imaging of the chest was performed using the standard protocol during bolus administration of intravenous contrast. Multiplanar CT image reconstructions and MIPs were obtained to evaluate the vascular anatomy.  CONTRAST:  80mL OMNIPAQUE IOHEXOL 350 MG/ML SOLN  COMPARISON:  Chest CT 09/17/2013.  FINDINGS: Mediastinum: No filling defects within the pulmonary arterial tree to suggest underlying pulmonary embolism. Heart size is normal. There is no significant pericardial fluid, thickening or pericardial calcification. No pathologically enlarged mediastinal or hilar lymph nodes. Esophagus is unremarkable in appearance. 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  Lungs/Pleura: No acute consolidative airspace disease. No pleural effusions. 3 mm  right middle lobe nodule (image 75 of series 506). No other suspicious appearing pulmonary nodules or masses.  Upper Abdomen: Unremarkable.  Musculoskeletal: There are no aggressive appearing lytic or blastic lesions noted in the visualized portions of the skeleton.  Review of the MIP images confirms the above findings.  IMPRESSION: 1. No evidence of pulmonary embolism. 2. No acute findings to account for the patient's symptoms. 3. 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 4. 3 mm nodule in the lateral segment of the right middle lobe. This is highly nonspecific, and statistically likely benign in this young patient. If the patient is at high risk for bronchogenic carcinoma, follow-up chest CT at 1 year is recommended. If the patient is at low risk, no follow-up is needed. This recommendation follows the consensus statement: Guidelines for Management of Small Pulmonary Nodules Detected on CT Scans: A Statement from the Fleischner Society as published in Radiology 2005; 237:395-400.   Electronically Signed   By: Trudie Reedaniel   Entrikin M.D.   On: 02/08/2014 02:29   Ct Cervical Spine Wo Contrast  02/03/2014   CLINICAL DATA:  Fall. Possible syncopal episode. Headache. Neck pain.  EXAM: CT HEAD WITHOUT CONTRAST  CT CERVICAL SPINE WITHOUT CONTRAST  TECHNIQUE: Multidetector CT imaging of the head and cervical spine was performed following the standard protocol without intravenous contrast. Multiplanar CT image reconstructions of the cervical spine were also generated.  COMPARISON:  02/23/2013  FINDINGS: CT HEAD FINDINGS  No evidence for acute infarction, hemorrhage, mass lesion, hydrocephalus, or extra-axial fluid. No atrophy or white matter disease. Intact calvarium. No acute sinus or mastoid disease.  CT CERVICAL SPINE FINDINGS  There is no visible cervical spine fracture, traumatic subluxation, prevertebral soft tissue swelling, or intraspinal hematoma. Prior cervical fusion at C5-C6 appears solid. Airway midline. No neck masses. No lung apex lesion.  There is moderate atherosclerotic plaque at the LEFT internal carotid artery origin, premature for the patient's age. It is unclear if the patient could have a symptomatic carotid stenosis. Recommend elective carotid Dopplers in the outpatient setting.  IMPRESSION: No skull fracture or intracranial hemorrhage. Similar appearance to priors.  No cervical spine fracture or traumatic subluxation.  LEFT internal carotid artery origin atherosclerosis. Consider outpatient evaluation with carotid Dopplers.   Electronically Signed   By: Davonna BellingJohn  Curnes M.D.   On: 02/03/2014 21:00   Mr Lumbar Spine W Wo Contrast  02/09/2014   CLINICAL DATA:  Chronic and persistent low back pain.  EXAM: MRI LUMBAR SPINE WITHOUT AND WITH CONTRAST  TECHNIQUE: Multiplanar and multiecho pulse sequences of the lumbar spine were obtained without and with intravenous contrast.  CONTRAST:  9mL MULTIHANCE GADOBENATE DIMEGLUMINE 529 MG/ML IV SOLN  COMPARISON:  Radiographs dated 02/06/2014 and MRIs dated 09/17/2013 and 05/17/2013   FINDINGS: Normal conus tip at L1-2. Normal paraspinal soft tissues except for postsurgical changes in the lower lumbar spine.  T11-12 through L3-4: Normal except for old healed slight compression fracture of the superior endplate of L1.  L4-5: Disc space narrowing with degenerative changes of the endplates. No recurrent disc protrusion or neural impingement. Minimal enhancing scar tissue around the left side of the thecal sac to the expected degree. The fluid collection in recurrent disc protrusion present on the prior MRI have been removed. Surgery after the last MRI demonstrated that the abnormality adjacent to the left side of the thecal sac at L4-5 was a recurrent disc protrusion rather than an epidural abscess.  L5-S1: Solid interbody and posterior fusion. Minimal  enhancing scar tissue around the posterior aspect of the thecal sac and S1 nerve root sleeves. No neural impingement.  IMPRESSION: 1. Postsurgical changes at L4-5 and L5-S1 with no new or residual neural impingement. Degenerative changes of the endplates at L4-5. 2. The remainder of the lumbar spine has stable in appearance.   Electronically Signed   By: Geanie Cooley M.D.   On: 02/09/2014 10:56   Dg Chest Port 1 View  02/07/2014   CLINICAL DATA:  Chest pain and difficulty breathing.  EXAM: PORTABLE CHEST - 1 VIEW  COMPARISON:  Chest x-ray 02/05/2014.  FINDINGS: Lung volumes are normal. No consolidative airspace disease. No pleural effusions. No pneumothorax. No pulmonary nodule or mass noted. Pulmonary vasculature and the cardiomediastinal silhouette are within normal limits.  IMPRESSION: No radiographic evidence of acute cardiopulmonary disease.   Electronically Signed   By: Trudie Reed M.D.   On: 02/07/2014 23:56    Microbiology: Recent Results (from the past 240 hour(s))  MRSA PCR SCREENING     Status: None   Collection Time    02/08/14  2:19 AM      Result Value Ref Range Status   MRSA by PCR NEGATIVE  NEGATIVE Final   Comment:             The GeneXpert MRSA Assay (FDA     approved for NASAL specimens     only), is one component of a     comprehensive MRSA colonization     surveillance program. It is not     intended to diagnose MRSA     infection nor to guide or     monitor treatment for     MRSA infections.     Labs: Basic Metabolic Panel:  Recent Labs Lab 02/05/14 1710 02/06/14 1116 02/07/14 2153  NA 145 140 144  K 3.0* 3.1* 3.8  CL 104 102 106  CO2  --   --  25  GLUCOSE 86 98 87  BUN <3* <3* 4*  CREATININE 0.80 0.70 0.66  CALCIUM  --   --  8.3*   Liver Function Tests:  Recent Labs Lab 02/07/14 2153  AST 23  ALT 8  ALKPHOS 87  BILITOT 0.3  PROT 6.3  ALBUMIN 3.1*   No results found for this basename: LIPASE, AMYLASE,  in the last 168 hours  Recent Labs Lab 02/11/14 1520  AMMONIA 37   CBC:  Recent Labs Lab 02/05/14 1614 02/05/14 1710 02/06/14 1116 02/07/14 2229  WBC 5.9  --   --  7.0  NEUTROABS  --   --   --  3.7  HGB 14.6 16.7* 13.9 13.6  HCT 43.4 49.0* 41.0 40.4  MCV 95.6  --   --  96.2  PLT 373  --   --  305   Cardiac Enzymes:  Recent Labs Lab 02/08/14 0013 02/11/14 1520  CKTOTAL  --  34  TROPONINI <0.30  --    BNP: BNP (last 3 results) No results found for this basename: PROBNP,  in the last 8760 hours CBG:  Recent Labs Lab 02/05/14 1618 02/06/14 1101 02/08/14 0209  GLUCAP 86 101* 94       Signed:  Kaliegh Willadsen L  [facility] 02/12/2014, 11:11 AM

## 2014-02-12 NOTE — Progress Notes (Signed)
Physical Therapy Treatment Patient Details Name: Rachael Ramirez MRN: [id number] DOB: 08/29/1970 Today's Date: 02/12/2014    History of Present Illness 43 yo female presenting with multiple incidence of syncope and unresposiveness.     PT Comments    Pt able to amb without any change in consciousness. Required min A for balance/safety. Bed alarm off when room entered but nurse reported bed alarm was on when she was last in room. Suspect pt has been turning the bed alarm off. Recommend initially pt have assistance when mobilizing at home and follow up HHPT.  Follow Up Recommendations  Supervision for mobility/OOB     Equipment Recommendations  None recommended by PT    Recommendations for Other Services       Precautions / Restrictions Precautions Precautions: Fall Precaution Comments: multiple unresponsive episodes Restrictions Weight Bearing Restrictions: No    Mobility  Bed Mobility   Bed Mobility: Supine to Sit;Sit to Supine   Sidelying to sit: Supervision   Sit to supine: Supervision      Transfers Overall transfer level: Needs assistance Equipment used: 1 person hand held assist Transfers: Sit to/from Stand Sit to Stand: Min assist         General transfer comment: Assist for balance and support.  Ambulation/Gait Ambulation/Gait assistance: Min assist;+2 safety/equipment Ambulation Distance (Feet): 125 Feet Assistive device: 1 person hand held assist Gait Pattern/deviations: Step-through pattern;Decreased step length - right;Decreased step length - left;Trunk flexed;Narrow base of support Gait velocity: decreased Gait velocity interpretation: Below normal speed for age/gender General Gait Details: Assist for balance and safety. Pt with downward gaze throughout amb.   Stairs Stairs: Yes Stairs assistance: Min assist Stair Management: Step to pattern;Forwards Number of Stairs: 2 General stair comments: Assist for balance  Wheelchair Mobility     Modified Rankin (Stroke Patients Only)       Balance Overall balance assessment: Needs assistance Sitting-balance support: No upper extremity supported;Feet supported Sitting balance-Leahy Scale: Fair     Standing balance support: Single extremity supported Standing balance-Leahy Scale: Poor                      Cognition Arousal/Alertness: Awake/alert Behavior During Therapy: Flat affect Overall Cognitive Status: Within Functional Limits for tasks assessed                 General Comments: Responsive throughout treatment    Exercises      General Comments        Pertinent Vitals/Pain Pain Assessment: Faces Faces Pain Scale: Hurts little more Pain Location: back Pain Intervention(s): Limited activity within patient's tolerance    Home Living                      Prior Function            PT Goals (current goals can now be found in the care plan section) Progress towards PT goals: Progressing toward goals    Frequency  Min 3X/week    PT Plan Current plan remains appropriate    Co-evaluation             End of Session Equipment Utilized During Treatment: Gait belt Activity Tolerance: Patient tolerated treatment well Patient left: in bed;with call bell/phone within reach;with bed alarm set     Time: 1032-1040 PT Time Calculation (min): 8 min  Charges:  $Gait Training: 8-22 mins  G Codes:      Chalese Peach 02/12/2014, 11:30 AM  Skip Mayerary Emigdio Wildeman PT [phone]

## 2014-02-13 LAB — ACTH: C206 ACTH: 12 pg/mL (ref 6–50)

## 2014-04-21 ENCOUNTER — Emergency Department (HOSPITAL_COMMUNITY)
Admission: EM | Admit: 2014-04-21 | Discharge: 2014-04-22 | Disposition: A | Discharge status: Home / Self Care | Payer: Managed Care, Other (non HMO) | Attending: Emergency Medicine | Admitting: Emergency Medicine

## 2014-04-21 DIAGNOSIS — K219 Gastro-esophageal reflux disease without esophagitis: Secondary | ICD-10-CM | POA: Diagnosis not present

## 2014-04-21 DIAGNOSIS — Z9889 Other specified postprocedural states: Secondary | ICD-10-CM | POA: Diagnosis not present

## 2014-04-21 DIAGNOSIS — Z8669 Personal history of other diseases of the nervous system and sense organs: Secondary | ICD-10-CM | POA: Diagnosis not present

## 2014-04-21 DIAGNOSIS — F419 Anxiety disorder, unspecified: Secondary | ICD-10-CM | POA: Insufficient documentation

## 2014-04-21 DIAGNOSIS — Z79899 Other long term (current) drug therapy: Secondary | ICD-10-CM | POA: Diagnosis not present

## 2014-04-21 DIAGNOSIS — Z720 Tobacco use: Secondary | ICD-10-CM | POA: Diagnosis not present

## 2014-04-21 DIAGNOSIS — Z8614 Personal history of Methicillin resistant Staphylococcus aureus infection: Secondary | ICD-10-CM | POA: Insufficient documentation

## 2014-04-21 DIAGNOSIS — G8929 Other chronic pain: Secondary | ICD-10-CM | POA: Diagnosis not present

## 2014-04-21 DIAGNOSIS — Z8739 Personal history of other diseases of the musculoskeletal system and connective tissue: Secondary | ICD-10-CM | POA: Insufficient documentation

## 2014-04-21 DIAGNOSIS — J45909 Unspecified asthma, uncomplicated: Secondary | ICD-10-CM | POA: Insufficient documentation

## 2014-04-21 DIAGNOSIS — Y9289 Other specified places as the place of occurrence of the external cause: Secondary | ICD-10-CM | POA: Diagnosis not present

## 2014-04-21 DIAGNOSIS — S0003XA Contusion of scalp, initial encounter: Secondary | ICD-10-CM | POA: Insufficient documentation

## 2014-04-21 DIAGNOSIS — F319 Bipolar disorder, unspecified: Secondary | ICD-10-CM | POA: Insufficient documentation

## 2014-04-21 DIAGNOSIS — W109XXA Fall (on) (from) unspecified stairs and steps, initial encounter: Secondary | ICD-10-CM | POA: Diagnosis not present

## 2014-04-21 DIAGNOSIS — I10 Essential (primary) hypertension: Secondary | ICD-10-CM | POA: Diagnosis not present

## 2014-04-21 DIAGNOSIS — S300XXA Contusion of lower back and pelvis, initial encounter: Secondary | ICD-10-CM | POA: Insufficient documentation

## 2014-04-21 DIAGNOSIS — W108XXA Fall (on) (from) other stairs and steps, initial encounter: Secondary | ICD-10-CM

## 2014-04-21 DIAGNOSIS — Y9389 Activity, other specified: Secondary | ICD-10-CM | POA: Diagnosis not present

## 2014-04-21 DIAGNOSIS — S20229A Contusion of unspecified back wall of thorax, initial encounter: Secondary | ICD-10-CM

## 2014-04-21 DIAGNOSIS — S3992XA Unspecified injury of lower back, initial encounter: Secondary | ICD-10-CM | POA: Diagnosis present

## 2014-04-21 NOTE — ED Notes (Signed)
Bed: [id number] Expected date:  Expected time:  Means of arrival:  Comments: EMS fall, back pain
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
## 2014-04-22 ENCOUNTER — Emergency Department (HOSPITAL_COMMUNITY): Payer: Managed Care, Other (non HMO)

## 2014-04-22 LAB — RAPID URINE DRUG SCREEN, HOSP PERFORMED
AMPHETAMINES: NOT DETECTED
Barbiturates: NOT DETECTED
Benzodiazepines: NOT DETECTED
Cocaine: NOT DETECTED
Opiates: NOT DETECTED
Tetrahydrocannabinol: NOT DETECTED

## 2014-04-22 MED ORDER — TRAMADOL HCL 50 MG PO TABS: 50.0000 mg | ORAL_TABLET | Freq: Four times a day (QID) | ORAL | Status: DC | PRN

## 2014-04-22 NOTE — ED Notes (Signed)
Patient denies history of substance abuse and SI, but the electronic chart states otherwise including narcotic overdose recently.

## 2014-04-22 NOTE — ED Notes (Signed)
Pt removed her pants with no assistance. Used the bedpan to urinate.

## 2014-04-22 NOTE — ED Provider Notes (Signed)
CSN: [id number]     Arrival date & time 04/21/14  2343 History   First MD Initiated Contact with Patient 04/22/14 0207     Chief Complaint  Patient presents with  . Fall  . Back Pain     (Consider location/radiation/quality/duration/timing/severity/associated sxs/prior Treatment) Patient is a 43 y.o. female presenting with fall and back pain. The history is provided by the patient and the spouse.  Fall  Back Pain She fell down 6 or 7 steps without any obvious loss of consciousness. She is complaining of pain in her lower back with radiation to her right leg. She rates pain a 10/10. There's been nausea or vomiting. She does have history of syncopal episodes but did not have one to precipitate this fall. Patient's husband stated they did not know why she fell although he had told triage that she was placed down the steps. She has not taken anything for pain. She denied head or neck injury.  Past Medical History  Diagnosis Date  . Back pain   . Asthma     uses Combivent daily as needed  . Insomnia     takes Trazodone nightly  . Anxiety     takes Klonopin daily as needed  . PONV (postoperative nausea and vomiting)   . Cough   . Spinal headache   . Weakness     left leg  . GERD (gastroesophageal reflux disease)     takes Omeprazole daily  . Urinary urgency   . Bipolar 1 disorder     takes Lamictal daily  . Chronic low back pain     HNP and Radiculopathy  . MRSA (methicillin resistant Staphylococcus aureus)     Postive nasal swab  . Hypertension   . Collapsed lung 3/15    "while in hospital awaiting back OR"   Past Surgical History  Procedure Laterality Date  . Percutaneous pinning Right 1992    "hand"  . Anterior cervical decomp/discectomy fusion  1996    "took bone off her hip"  . Elbow fracture surgery Left 1975  . Elbow fracture surgery Left 2002    "redo"  . Lumbar disc surgery  2008 X 2  . Posterior lumbar fusion  2008  . Hardware removal  2008    "took screw  out of lower back"  . Epidural block injection    . Lumbar laminectomy/decompression microdiscectomy Left 07/13/2013    Procedure: Left Lumbar four-five microdiskectomy;  Surgeon: Carmela Hurt, MD;  Location: [facility];  Service: Neurosurgery;  Laterality: Left;  Left Lumbar four-five microdiskectomy  . Lumbar wound debridement N/A 09/21/2013    Procedure: LUMBAR WOUND re-exploration and disectomy;  Surgeon: Carmela Hurt, MD;  Location: [facility];  Service: Neurosurgery;  Laterality: N/A;  . Tonsillectomy    . Fracture surgery    . Back surgery     Family History  Problem Relation Age of Onset  . Multiple sclerosis Mother   . COPD Father   . Alcohol abuse Maternal Grandfather   . Alcohol abuse Brother   . Cirrhosis Brother    History  Substance Use Topics  . Smoking status: Current Every Day Smoker -- 1.00 packs/day for 18 years    Types: Cigarettes  . Smokeless tobacco: Never Used  . Alcohol Use: No   OB History   Grav Para Term Preterm Abortions TAB SAB Ect Mult Living                 Review  of Systems  Musculoskeletal: Positive for back pain.  All other systems reviewed and are negative.     Allergies  Minocycline  Home Medications   Prior to Admission medications   Medication Sig Start Date End Date Taking? Authorizing Provider  albuterol-ipratropium (COMBIVENT) 18-103 MCG/ACT inhaler Inhale 1 puff into the lungs every 6 (six) hours as needed for wheezing or shortness of breath. For wheezing   Yes Historical Provider, MD  aspirin-acetaminophen-caffeine (EXCEDRIN MIGRAINE) [phone] MG per tablet Take 2 tablets by mouth every 6 (six) hours as needed for headache.   Yes Historical Provider, MD  clonazePAM (KLONOPIN) 1 MG tablet Take 1 mg by mouth 3 (three) times daily.   Yes Historical Provider, MD  omeprazole (PRILOSEC) 20 MG capsule Take 20 mg by mouth daily.    Yes Historical Provider, MD  ondansetron (ZOFRAN) 8 MG tablet Take 8 mg by mouth every 6 (six)  hours as needed for vomiting.  01/31/14  Yes Historical Provider, MD  oxyCODONE (ROXICODONE) 5 MG immediate release tablet Take 1 tablet (5 mg total) by mouth every 4 (four) hours as needed for severe pain. 02/06/14  Yes Melene Planan Floyd, MD  pregabalin (LYRICA) 300 MG capsule Take 1 capsule (300 mg total) by mouth at bedtime. 02/12/14  Yes Christiane Haorinna L Sullivan, MD  pregabalin (LYRICA) 300 MG capsule Take 300 mg by mouth 2 (two) times daily. Or at bedtime   Yes Historical Provider, MD  traZODone (DESYREL) 150 MG tablet Take 0.5 tablets (75 mg total) by mouth at bedtime as needed for sleep. 02/12/14  Yes Christiane Haorinna L Sullivan, MD   BP 126/85  Pulse 84  Temp(Src) 98.1 F (36.7 C) (Oral)  SpO2 100% Physical Exam  Nursing note and vitals reviewed.  43 year old female, resting comfortably and in no acute distress. Vital signs are normal. Oxygen saturation is 100%, which is normal. Head is normocephalic and atraumatic. PERRLA, EOMI. Oropharynx is clear. Neck is immobilized in a stiff cervical collar and is nontender. There is no  adenopathy or JVD. Back is nontender and there is no CVA tenderness. Straight leg raise is negative. Lungs are clear without rales, wheezes, or rhonchi. Chest is nontender. Heart has regular rate and rhythm without murmur. Abdomen is soft, flat, nontender without masses or hepatosplenomegaly and peristalsis is normoactive. Extremities have no cyanosis or edema, full range of motion is present. Skin is warm and dry without rash. Neurologic: She is sleepy but arousable and oriented when aroused, cranial nerves are intact, there are no motor or sensory deficits.  ED Course  Procedures (including critical care time) Labs Review Results for orders placed during the hospital encounter of 04/21/14  URINE RAPID DRUG SCREEN (HOSP PERFORMED)      Result Value Ref Range   Opiates NONE DETECTED  NONE DETECTED   Cocaine NONE DETECTED  NONE DETECTED   Benzodiazepines NONE DETECTED  NONE  DETECTED   Amphetamines NONE DETECTED  NONE DETECTED   Tetrahydrocannabinol NONE DETECTED  NONE DETECTED   Barbiturates NONE DETECTED  NONE DETECTED    Imaging Review Dg Lumbar Spine Complete  04/22/2014   CLINICAL DATA:  Low back pain status post fall.  EXAM: LUMBAR SPINE - COMPLETE 4+ VIEW  COMPARISON:  02/06/2014  FINDINGS: Postoperative changes at L5-S1. L1 compression deformity, similar to prior. Similar curvature of the lumbar spine. No displaced acute fracture identified.  IMPRESSION: Similar appearance to the lumbar spine to prior.   Electronically Signed   By: Lerry LinerAndrew  DelGaizo M.D.  On: 04/22/2014 03:18   Ct Head Wo Contrast  04/22/2014   CLINICAL DATA:  Fall  EXAM: CT HEAD WITHOUT CONTRAST  CT CERVICAL SPINE WITHOUT CONTRAST  TECHNIQUE: Multidetector CT imaging of the head and cervical spine was performed following the standard protocol without intravenous contrast. Multiplanar CT image reconstructions of the cervical spine were also generated.  COMPARISON:  02/08/2014 head CT, 02/03/2014 cervical spine CT  FINDINGS: CT HEAD FINDINGS  Maintained gray-white differentiation. No CT evidence of an acute infarction. No intraparenchymal hemorrhage, mass, mass effect, or abnormal extra-axial fluid collection. The ventricles, cisterns, and sulci are normal in size, shape, and position. The visualized paranasal sinuses and mastoid air cells are predominantly clear. No displaced calvarial fracture.  CT CERVICAL SPINE FINDINGS  Lung apices are clear. Maintained craniocervical relationship. No dens fracture. C5-6 osseous fusion. Paravertebral soft tissues within normal limits. No displaced acute fracture or malalignment. Left carotid atherosclerotic disease.  IMPRESSION: No acute intracranial abnormality.  No acute osseous abnormality of the cervical spine.   Electronically Signed   By: Jearld LeschAndrew  DelGaizo M.D.   On: 04/22/2014 04:09   Ct Cervical Spine Wo Contrast  04/22/2014   CLINICAL DATA:  Fall   EXAM: CT HEAD WITHOUT CONTRAST  CT CERVICAL SPINE WITHOUT CONTRAST  TECHNIQUE: Multidetector CT imaging of the head and cervical spine was performed following the standard protocol without intravenous contrast. Multiplanar CT image reconstructions of the cervical spine were also generated.  COMPARISON:  02/08/2014 head CT, 02/03/2014 cervical spine CT  FINDINGS: CT HEAD FINDINGS  Maintained gray-white differentiation. No CT evidence of an acute infarction. No intraparenchymal hemorrhage, mass, mass effect, or abnormal extra-axial fluid collection. The ventricles, cisterns, and sulci are normal in size, shape, and position. The visualized paranasal sinuses and mastoid air cells are predominantly clear. No displaced calvarial fracture.  CT CERVICAL SPINE FINDINGS  Lung apices are clear. Maintained craniocervical relationship. No dens fracture. C5-6 osseous fusion. Paravertebral soft tissues within normal limits. No displaced acute fracture or malalignment. Left carotid atherosclerotic disease.  IMPRESSION: No acute intracranial abnormality.  No acute osseous abnormality of the cervical spine.   Electronically Signed   By: Jearld LeschAndrew  DelGaizo M.D.   On: 04/22/2014 04:09    MDM   Final diagnoses:  Fall down stairs, initial encounter  Contusion, back, unspecified laterality, initial encounter  Contusion of scalp, initial encounter    Fall without obvious injury. Somnolence of uncertain cause. In spite of her claim of no head injury, she will be sent for CT of head based on mechanism of injury and somnolence. CT will also be obtained of cervical spine and plain films of lumbar spine. I have reviewed her record on electronic control substance reporting website and she had been getting 180 tablets of hydrocodone-acetaminophen 10-325 on a monthly basis with the last prescription having been filled on August 26. Old records were reviewed and she had been admitted for evaluation of syncope and near syncope in August  and the only finding was orthostatic hypotension. Based on history of significant narcotic use, drug screen will be obtained.  Drug screen is come back negative. CT scans of head and cervical spine are unremarkable as are lumbar spine x-rays. Patient is reevaluated and she is more awake and alert. She is discharged with prescription for tramadol and is to follow-up with her PCP.  Dione Boozeavid Deandrea Rion, MD 04/22/14 0530

## 2014-04-22 NOTE — Discharge Instructions (Signed)
Take acetaminophen, ibuprofen, or naproxen as needed for less severe pain.  Fall Prevention and Home Safety Falls cause injuries and can affect all age groups. It is possible to use preventive measures to significantly decrease the likelihood of falls. There are many simple measures which can make your home safer and prevent falls. OUTDOORS  Repair cracks and edges of walkways and driveways.  Remove high doorway thresholds.  Trim shrubbery on the main path into your home.  Have good outside lighting.  Clear walkways of tools, rocks, debris, and clutter.  Check that handrails are not broken and are securely fastened. Both sides of steps should have handrails.  Have leaves, snow, and ice cleared regularly.  Use sand or salt on walkways during winter months.  In the garage, clean up grease or oil spills. BATHROOM  Install night lights.  Install grab bars by the toilet and in the tub and shower.  Use non-skid mats or decals in the tub or shower.  Place a plastic non-slip stool in the shower to sit on, if needed.  Keep floors dry and clean up all water on the floor immediately.  Remove soap buildup in the tub or shower on a regular basis.  Secure bath mats with non-slip, double-sided rug tape.  Remove throw rugs and tripping hazards from the floors. BEDROOMS  Install night lights.  Make sure a bedside light is easy to reach.  Do not use oversized bedding.  Keep a telephone by your bedside.  Have a firm chair with side arms to use for getting dressed.  Remove throw rugs and tripping hazards from the floor. KITCHEN  Keep handles on pots and pans turned toward the center of the stove. Use back burners when possible.  Clean up spills quickly and allow time for drying.  Avoid walking on wet floors.  Avoid hot utensils and knives.  Position shelves so they are not too high or low.  Place commonly used objects within easy reach.  If necessary, use a sturdy step  stool with a grab bar when reaching.  Keep electrical cables out of the way.  Do not use floor polish or wax that makes floors slippery. If you must use wax, use non-skid floor wax.  Remove throw rugs and tripping hazards from the floor. STAIRWAYS  Never leave objects on stairs.  Place handrails on both sides of stairways and use them. Fix any loose handrails. Make sure handrails on both sides of the stairways are as long as the stairs.  Check carpeting to make sure it is firmly attached along stairs. Make repairs to worn or loose carpet promptly.  Avoid placing throw rugs at the top or bottom of stairways, or properly secure the rug with carpet tape to prevent slippage. Get rid of throw rugs, if possible.  Have an electrician put in a light switch at the top and bottom of the stairs. OTHER FALL PREVENTION TIPS  Wear low-heel or rubber-soled shoes that are supportive and fit well. Wear closed toe shoes.  When using a stepladder, make sure it is fully opened and both spreaders are firmly locked. Do not climb a closed stepladder.  Add color or contrast paint or tape to grab bars and handrails in your home. Place contrasting color strips on first and last steps.  Learn and use mobility aids as needed. Install an electrical emergency response system.  Turn on lights to avoid dark areas. Replace light bulbs that burn out immediately. Get light switches that glow.  Arrange furniture to create clear pathways. Keep furniture in the same place.  Firmly attach carpet with non-skid or double-sided tape.  Eliminate uneven floor surfaces.  Select a carpet pattern that does not visually hide the edge of steps.  Be aware of all pets. OTHER HOME SAFETY TIPS  Set the water temperature for 120 F (48.8 C).  Keep emergency numbers on or near the telephone.  Keep smoke detectors on every level of the home and near sleeping areas. Document Released: 06/04/2002 Document Revised: 12/14/2011  Document Reviewed: 09/03/2011 [facility] Patient Information 2015 [place], [place]. This information is not intended to replace advice given to you by your health care provider. Make sure you discuss any questions you have with your health care provider.   Contusion A contusion is a deep bruise. Contusions are the result of an injury that caused bleeding under the skin. The contusion may turn blue, purple, or yellow. Minor injuries will give you a painless contusion, but more severe contusions may stay painful and swollen for a few weeks.  CAUSES  A contusion is usually caused by a blow, trauma, or direct force to an area of the body. SYMPTOMS   Swelling and redness of the injured area.  Bruising of the injured area.  Tenderness and soreness of the injured area.  Pain. DIAGNOSIS  The diagnosis can be made by taking a history and physical exam. An X-ray, CT scan, or MRI may be needed to determine if there were any associated injuries, such as fractures. TREATMENT  Specific treatment will depend on what area of the body was injured. In general, the best treatment for a contusion is resting, icing, elevating, and applying cold compresses to the injured area. Over-the-counter medicines may also be recommended for pain control. Ask your caregiver what the best treatment is for your contusion. HOME CARE INSTRUCTIONS   Put ice on the injured area.  Put ice in a plastic bag.  Place a towel between your skin and the bag.  Leave the ice on for 15-20 minutes, 3-4 times a day, or as directed by your health care provider.  Only take over-the-counter or prescription medicines for pain, discomfort, or fever as directed by your caregiver. Your caregiver may recommend avoiding anti-inflammatory medicines (aspirin, ibuprofen, and naproxen) for 48 hours because these medicines may increase bruising.  Rest the injured area.  If possible, elevate the injured area to reduce swelling. SEEK IMMEDIATE  MEDICAL CARE IF:   You have increased bruising or swelling.  You have pain that is getting worse.  Your swelling or pain is not relieved with medicines. MAKE SURE YOU:   Understand these instructions.  Will watch your condition.  Will get help right away if you are not doing well or get worse. Document Released: 03/24/2005 Document Revised: 06/19/2013 Document Reviewed: 04/19/2011 [facility] Patient Information 2015 [place], [place]. This information is not intended to replace advice given to you by your health care provider. Make sure you discuss any questions you have with your health care provider.  Tramadol tablets What is this medicine? TRAMADOL (TRA ma dole) is a pain reliever. It is used to treat moderate to severe pain in adults. This medicine may be used for other purposes; ask your health care provider or pharmacist if you have questions. COMMON BRAND NAME(S): Ultram What should I tell my health care provider before I take this medicine? They need to know if you have any of these conditions: -brain tumor -depression -drug abuse or addiction -head injury -  if you frequently drink alcohol containing drinks -kidney disease or trouble passing urine -liver disease -lung disease, asthma, or breathing problems -seizures or epilepsy -suicidal thoughts, plans, or attempt; a previous suicide attempt by you or a family member -an unusual or allergic reaction to tramadol, codeine, other medicines, foods, dyes, or preservatives -pregnant or trying to get pregnant -breast-feeding How should I use this medicine? Take this medicine by mouth with a full glass of water. Follow the directions on the prescription label. If the medicine upsets your stomach, take it with food or milk. Do not take more medicine than you are told to take. Talk to your pediatrician regarding the use of this medicine in children. Special care may be needed. Overdosage: If you think you have taken too much of this  medicine contact a poison control center or emergency room at once. NOTE: This medicine is only for you. Do not share this medicine with others. What if I miss a dose? If you miss a dose, take it as soon as you can. If it is almost time for your next dose, take only that dose. Do not take double or extra doses. What may interact with this medicine? Do not take this medicine with any of the following medications: -MAOIs like Carbex, Eldepryl, Marplan, Nardil, and Parnate This medicine may also interact with the following medications: -alcohol or medicines that contain alcohol -antihistamines -benzodiazepines -bupropion -carbamazepine or oxcarbazepine -clozapine -cyclobenzaprine -digoxin -furazolidone -linezolid -medicines for depression, anxiety, or psychotic disturbances -medicines for migraine headache like almotriptan, eletriptan, frovatriptan, naratriptan, rizatriptan, sumatriptan, zolmitriptan -medicines for pain like pentazocine, buprenorphine, butorphanol, meperidine, nalbuphine, and propoxyphene -medicines for sleep -muscle relaxants -naltrexone -phenobarbital -phenothiazines like perphenazine, thioridazine, chlorpromazine, mesoridazine, fluphenazine, prochlorperazine, promazine, and trifluoperazine -procarbazine -warfarin This list may not describe all possible interactions. Give your health care provider a list of all the medicines, herbs, non-prescription drugs, or dietary supplements you use. Also tell them if you smoke, drink alcohol, or use illegal drugs. Some items may interact with your medicine. What should I watch for while using this medicine? Tell your doctor or health care professional if your pain does not go away, if it gets worse, or if you have new or a different type of pain. You may develop tolerance to the medicine. Tolerance means that you will need a higher dose of the medicine for pain relief. Tolerance is normal and is expected if you take this medicine  for a long time. Do not suddenly stop taking your medicine because you may develop a severe reaction. Your body becomes used to the medicine. This does NOT mean you are addicted. Addiction is a behavior related to getting and using a drug for a non-medical reason. If you have pain, you have a medical reason to take pain medicine. Your doctor will tell you how much medicine to take. If your doctor wants you to stop the medicine, the dose will be slowly lowered over time to avoid any side effects. You may get drowsy or dizzy. Do not drive, use machinery, or do anything that needs mental alertness until you know how this medicine affects you. Do not stand or sit up quickly, especially if you are an older patient. This reduces the risk of dizzy or fainting spells. Alcohol can increase or decrease the effects of this medicine. Avoid alcoholic drinks. You may have constipation. Try to have a bowel movement at least every 2 to 3 days. If you do not have a bowel movement for 3 days,  call your doctor or health care professional. Your mouth may get dry. Chewing sugarless gum or sucking hard candy, and drinking plenty of water may help. Contact your doctor if the problem does not go away or is severe. What side effects may I notice from receiving this medicine? Side effects that you should report to your doctor or health care professional as soon as possible: -allergic reactions like skin rash, itching or hives, swelling of the face, lips, or tongue -breathing difficulties, wheezing -confusion -itching -light headedness or fainting spells -redness, blistering, peeling or loosening of the skin, including inside the mouth -seizures Side effects that usually do not require medical attention (report to your doctor or health care professional if they continue or are bothersome): -constipation -dizziness -drowsiness -headache -nausea, vomiting This list may not describe all possible side effects. Call your doctor  for medical advice about side effects. You may report side effects to FDA at 1-800-FDA-1088. Where should I keep my medicine? Keep out of the reach of children. Store at room temperature between 15 and 30 degrees C (59 and 86 degrees F). Keep container tightly closed. Throw away any unused medicine after the expiration date. NOTE: This sheet is a summary. It may not cover all possible information. If you have questions about this medicine, talk to your doctor, pharmacist, or health care provider.  2015, [employer]. (2010-02-25 11:55:44)

## 2014-04-22 NOTE — ED Notes (Signed)
RR: 18, even, regular, and unlabored.

## 2014-04-22 NOTE — ED Notes (Signed)
Pt resting quietly with eyes closed. Appears in no distress.  

## 2014-07-11 ENCOUNTER — Encounter (HOSPITAL_COMMUNITY): Payer: Self-pay | Admitting: Neurosurgery

## 2014-07-22 ENCOUNTER — Encounter (HOSPITAL_COMMUNITY): Payer: Self-pay | Admitting: Emergency Medicine

## 2014-07-22 ENCOUNTER — Emergency Department (HOSPITAL_COMMUNITY)
Admission: EM | Admit: 2014-07-22 | Discharge: 2014-07-22 | Disposition: A | Discharge status: Home / Self Care | Payer: Managed Care, Other (non HMO) | Attending: Emergency Medicine | Admitting: Emergency Medicine

## 2014-07-22 ENCOUNTER — Encounter (HOSPITAL_COMMUNITY): Payer: Self-pay | Admitting: *Deleted

## 2014-07-22 ENCOUNTER — Emergency Department (HOSPITAL_COMMUNITY)
Admission: EM | Admit: 2014-07-22 | Discharge: 2014-07-22 | Disposition: A | Discharge status: Home / Self Care | Payer: Managed Care, Other (non HMO) | Source: Home / Self Care | Attending: Emergency Medicine | Admitting: Emergency Medicine

## 2014-07-22 DIAGNOSIS — R202 Paresthesia of skin: Secondary | ICD-10-CM | POA: Diagnosis not present

## 2014-07-22 DIAGNOSIS — I10 Essential (primary) hypertension: Secondary | ICD-10-CM | POA: Insufficient documentation

## 2014-07-22 DIAGNOSIS — F319 Bipolar disorder, unspecified: Secondary | ICD-10-CM | POA: Diagnosis not present

## 2014-07-22 DIAGNOSIS — Z8614 Personal history of Methicillin resistant Staphylococcus aureus infection: Secondary | ICD-10-CM | POA: Insufficient documentation

## 2014-07-22 DIAGNOSIS — Z79899 Other long term (current) drug therapy: Secondary | ICD-10-CM

## 2014-07-22 DIAGNOSIS — Z7982 Long term (current) use of aspirin: Secondary | ICD-10-CM | POA: Diagnosis not present

## 2014-07-22 DIAGNOSIS — K219 Gastro-esophageal reflux disease without esophagitis: Secondary | ICD-10-CM | POA: Insufficient documentation

## 2014-07-22 DIAGNOSIS — G8929 Other chronic pain: Secondary | ICD-10-CM | POA: Insufficient documentation

## 2014-07-22 DIAGNOSIS — Y9389 Activity, other specified: Secondary | ICD-10-CM

## 2014-07-22 DIAGNOSIS — Y9289 Other specified places as the place of occurrence of the external cause: Secondary | ICD-10-CM | POA: Insufficient documentation

## 2014-07-22 DIAGNOSIS — F419 Anxiety disorder, unspecified: Secondary | ICD-10-CM | POA: Insufficient documentation

## 2014-07-22 DIAGNOSIS — M6281 Muscle weakness (generalized): Secondary | ICD-10-CM

## 2014-07-22 DIAGNOSIS — J45909 Unspecified asthma, uncomplicated: Secondary | ICD-10-CM | POA: Insufficient documentation

## 2014-07-22 DIAGNOSIS — Z9889 Other specified postprocedural states: Secondary | ICD-10-CM

## 2014-07-22 DIAGNOSIS — M545 Low back pain: Secondary | ICD-10-CM | POA: Diagnosis not present

## 2014-07-22 DIAGNOSIS — Z79891 Long term (current) use of opiate analgesic: Secondary | ICD-10-CM | POA: Diagnosis not present

## 2014-07-22 DIAGNOSIS — Z8619 Personal history of other infectious and parasitic diseases: Secondary | ICD-10-CM | POA: Diagnosis not present

## 2014-07-22 DIAGNOSIS — Z720 Tobacco use: Secondary | ICD-10-CM

## 2014-07-22 DIAGNOSIS — W19XXXA Unspecified fall, initial encounter: Secondary | ICD-10-CM | POA: Insufficient documentation

## 2014-07-22 DIAGNOSIS — Y998 Other external cause status: Secondary | ICD-10-CM | POA: Insufficient documentation

## 2014-07-22 DIAGNOSIS — R200 Anesthesia of skin: Secondary | ICD-10-CM | POA: Insufficient documentation

## 2014-07-22 DIAGNOSIS — S3992XA Unspecified injury of lower back, initial encounter: Secondary | ICD-10-CM | POA: Insufficient documentation

## 2014-07-22 DIAGNOSIS — R110 Nausea: Secondary | ICD-10-CM | POA: Diagnosis not present

## 2014-07-22 DIAGNOSIS — G4700 Insomnia, unspecified: Secondary | ICD-10-CM | POA: Insufficient documentation

## 2014-07-22 DIAGNOSIS — M549 Dorsalgia, unspecified: Principal | ICD-10-CM

## 2014-07-22 MED ORDER — DIAZEPAM 5 MG PO TABS
5.0000 mg | ORAL_TABLET | Freq: Once | ORAL | Status: AC
  Administered 2014-07-22: 5 mg via ORAL
  Filled 2014-07-22: qty 1

## 2014-07-22 MED ORDER — CYCLOBENZAPRINE HCL 10 MG PO TABS
10.0000 mg | ORAL_TABLET | Freq: Once | ORAL | Status: AC
  Administered 2014-07-22: 10 mg via ORAL
  Filled 2014-07-22: qty 1

## 2014-07-22 MED ORDER — HYDROMORPHONE HCL 1 MG/ML IJ SOLN
1.0000 mg | Freq: Once | INTRAMUSCULAR | Status: AC
  Administered 2014-07-22: 1 mg via INTRAMUSCULAR
  Filled 2014-07-22: qty 1

## 2014-07-22 MED ORDER — KETOROLAC TROMETHAMINE 60 MG/2ML IM SOLN
30.0000 mg | Freq: Once | INTRAMUSCULAR | Status: AC
  Administered 2014-07-22: 30 mg via INTRAMUSCULAR
  Filled 2014-07-22: qty 2

## 2014-07-22 NOTE — ED Notes (Signed)
Per EMS, pt comes from home with c/o severe back pain that is chronic. Pt screaming on scene "wanting a shot in her back and IV." Pt tried to get off the stretcher multiple times and attempted to fall. Pt A&OX4, NAD noted. VSS.

## 2014-07-22 NOTE — ED Notes (Signed)
Pt discharged by wheelchair to waiting room to wait for her daughter or husband to come get her.

## 2014-07-22 NOTE — ED Notes (Signed)
Pt recently dc'd from ED. Reportedly fell in lobby.
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
## 2014-07-22 NOTE — Discharge Instructions (Signed)
Back Exercises Back exercises help treat and prevent back injuries. The goal is to increase your strength in your belly (abdominal) and back muscles. These exercises can also help with flexibility. Start these exercises when told by your doctor. HOME CARE Back exercises include: Pelvic Tilt.  Lie on your back with your knees bent. Tilt your pelvis until the lower part of your back is against the floor. Hold this position 5 to 10 sec. Repeat this exercise 5 to 10 times. Knee to Chest.  Pull 1 knee up against your chest and hold for 20 to 30 seconds. Repeat this with the other knee. This may be done with the other leg straight or bent, whichever feels better. Then, pull both knees up against your chest. Sit-Ups or Curl-Ups.  Bend your knees 90 degrees. Start with tilting your pelvis, and do a partial, slow sit-up. Only lift your upper half 30 to 45 degrees off the floor. Take at least 2 to 3 seonds for each sit-up. Do not do sit-ups with your knees out straight. If partial sit-ups are difficult, simply do the above but with only tightening your belly (abdominal) muscles and holding it as told. Hip-Lift.  Lie on your back with your knees flexed 90 degrees. Push down with your feet and shoulders as you raise your hips 2 inches off the floor. Hold for 10 seconds, repeat 5 to 10 times. Back Arches.  Lie on your stomach. Prop yourself up on bent elbows. Slowly press on your hands, causing an arch in your low back. Repeat 3 to 5 times. Shoulder-Lifts.  Lie face down with arms beside your body. Keep hips and belly pressed to floor as you slowly lift your head and shoulders off the floor. Do not overdo your exercises. Be careful in the beginning. Exercises may cause you some mild back discomfort. If the pain lasts for more than 15 minutes, stop the exercises until you see your doctor. Improvement with exercise for back problems is slow.  Document Released: 07/17/2010 Document Revised: 09/06/2011  Document Reviewed: 04/15/2011 [facility] Patient Information 2015 [place], [place]. This information is not intended to replace advice given to you by your health care provider. Make sure you discuss any questions you have with your health care provider.  Back Injury Prevention Back injuries can be extremely painful and difficult to heal. After having one back injury, you are much more likely to experience another later on. It is important to learn how to avoid injuring or re-injuring your back. The following tips can help you to prevent a back injury. PHYSICAL FITNESS  Exercise regularly and try to develop good tone in your abdominal muscles. Your abdominal muscles provide a lot of the support needed by your back.  Do aerobic exercises (walking, jogging, biking, swimming) regularly.  Do exercises that increase balance and strength (tai chi, yoga) regularly. This can decrease your risk of falling and injuring your back.  Stretch before and after exercising.  Maintain a healthy weight. The more you weigh, the more stress is placed on your back. For every pound of weight, 10 times that amount of pressure is placed on the back. DIET  Talk to your caregiver about how much calcium and vitamin D you need per day. These nutrients help to prevent weakening of the bones (osteoporosis). Osteoporosis can cause broken (fractured) bones that lead to back pain.  Include good sources of calcium in your diet, such as dairy products, green, leafy vegetables, and products with calcium added (fortified).  Include good sources of vitamin D in your diet, such as milk and foods that are fortified with vitamin D. °· Consider taking a nutritional supplement or a multivitamin if needed. °· Stop smoking if you smoke. °POSTURE °· Sit and stand up straight. Avoid leaning forward when you sit or hunching over when you stand. °· Choose chairs with good low back (lumbar) support. °· If you work at a desk, sit close to your work  so you do not need to lean over. Keep your chin tucked in. Keep your neck drawn back and elbows bent at a right angle. Your arms should look like the letter "L." °· Sit high and close to the steering wheel when you drive. Add a lumbar support to your car seat if needed. °· Avoid sitting or standing in one position for too long. Take breaks to get up, stretch, and walk around at least once every hour. Take breaks if you are driving for long periods of time. °· Sleep on your side with your knees slightly bent, or sleep on your back with a pillow under your knees. Do not sleep on your stomach. °LIFTING, TWISTING, AND REACHING °· Avoid heavy lifting, especially repetitive lifting. If you must do heavy lifting: °¨ Stretch before lifting. °¨ Work slowly. °¨ Rest between lifts. °¨ Use carts and dollies to move objects when possible. °¨ Make several small trips instead of carrying 1 heavy load. °¨ Ask for help when you need it. °¨ Ask for help when moving big, awkward objects. °· Follow these steps when lifting: °¨ Stand with your feet shoulder-width apart. °¨ Get as close to the object as you can. Do not try to pick up heavy objects that are far from your body. °¨ Use handles or lifting straps if they are available. °¨ Bend at your knees. Squat down, but keep your heels off the floor. °¨ Keep your shoulders pulled back, your chin tucked in, and your back straight. °¨ Lift the object slowly, tightening the muscles in your legs, abdomen, and buttocks. Keep the object as close to the center of your body as possible. °¨ When you put a load down, use these same guidelines in reverse. °· Do not: °¨ Lift the object above your waist. °¨ Twist at the waist while lifting or carrying a load. Move your feet if you need to turn, not your waist. °¨ Bend over without bending at your knees. °· Avoid reaching over your head, across a table, or for an object on a high surface. °OTHER TIPS °· Avoid wet floors and keep sidewalks clear of ice  to prevent falls. °· Do not sleep on a mattress that is too soft or too hard. °· Keep items that are used frequently within easy reach. °· Put heavier objects on shelves at waist level and lighter objects on lower or higher shelves. °· Find ways to decrease your stress, such as exercise, massage, or relaxation techniques. Stress can build up in your muscles. Tense muscles are more vulnerable to injury. °· Seek treatment for depression or anxiety if needed. These conditions can increase your risk of developing back pain. °SEEK MEDICAL CARE IF: °· You injure your back. °· You have questions about diet, exercise, or other ways to prevent back injuries. °MAKE SURE YOU: °· Understand these instructions. °· Will watch your condition. °· Will get help right away if you are not doing well or get worse. °Document Released: 07/22/2004 Document Revised: 09/06/2011 Document Reviewed: 07/26/2011 °[employer]® Patient Information ©  2015 [employer]. This information is not intended to replace advice given to you by your health care provider. Make sure you discuss any questions you have with your health care provider.
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
## 2014-07-22 NOTE — Discharge Instructions (Signed)
Back Exercises Back exercises help treat and prevent back injuries. The goal of back exercises is to increase the strength of your abdominal and back muscles and the flexibility of your back. These exercises should be started when you no longer have back pain. Back exercises include:  Pelvic Tilt. Lie on your back with your knees bent. Tilt your pelvis until the lower part of your back is against the floor. Hold this position 5 to 10 sec and repeat 5 to 10 times.  Knee to Chest. Pull first 1 knee up against your chest and hold for 20 to 30 seconds, repeat this with the other knee, and then both knees. This may be done with the other leg straight or bent, whichever feels better.  Sit-Ups or Curl-Ups. Bend your knees 90 degrees. Start with tilting your pelvis, and do a partial, slow sit-up, lifting your trunk only 30 to 45 degrees off the floor. Take at least 2 to 3 seconds for each sit-up. Do not do sit-ups with your knees out straight. If partial sit-ups are difficult, simply do the above but with only tightening your abdominal muscles and holding it as directed.  Hip-Lift. Lie on your back with your knees flexed 90 degrees. Push down with your feet and shoulders as you raise your hips a couple inches off the floor; hold for 10 seconds, repeat 5 to 10 times.  Back arches. Lie on your stomach, propping yourself up on bent elbows. Slowly press on your hands, causing an arch in your low back. Repeat 3 to 5 times. Any initial stiffness and discomfort should lessen with repetition over time.  Shoulder-Lifts. Lie face down with arms beside your body. Keep hips and torso pressed to floor as you slowly lift your head and shoulders off the floor. Do not overdo your exercises, especially in the beginning. Exercises may cause you some mild back discomfort which lasts for a few minutes; however, if the pain is more severe, or lasts for more than 15 minutes, do not continue exercises until you see your caregiver.  Improvement with exercise therapy for back problems is slow.  See your caregivers for assistance with developing a proper back exercise program. Document Released: 07/22/2004 Document Revised: 09/06/2011 Document Reviewed: 04/15/2011 [facility] Patient Information 2015 [employer], [place]. This information is not intended to replace advice given to you by your health care provider. Make sure you discuss any questions you have with your health care provider. Chronic Back Pain  When back pain lasts longer than 3 months, it is called chronic back pain.People with chronic back pain often go through certain periods that are more intense (flare-ups).  CAUSES Chronic back pain can be caused by wear and tear (degeneration) on different structures in your back. These structures include:  The bones of your spine (vertebrae) and the joints surrounding your spinal cord and nerve roots (facets).  The strong, fibrous tissues that connect your vertebrae (ligaments). Degeneration of these structures may result in pressure on your nerves. This can lead to constant pain. HOME CARE INSTRUCTIONS  Avoid bending, heavy lifting, prolonged sitting, and activities which make the problem worse.  Take brief periods of rest throughout the day to reduce your pain. Lying down or standing usually is better than sitting while you are resting.  Take over-the-counter or prescription medicines only as directed by your caregiver. SEEK IMMEDIATE MEDICAL CARE IF:   You have weakness or numbness in one of your legs or feet.  You have trouble controlling your bladder  or bowels.  You have nausea, vomiting, abdominal pain, shortness of breath, or fainting. Document Released: 07/22/2004 Document Revised: 09/06/2011 Document Reviewed: 05/29/2011 [facility] Patient Information 2015 [place]ExitCare, [place]. This information is not intended to replace advice given to you by your health care provider. Make sure you discuss any questions you have with  your health care provider.   Emergency Department Resource Guide 1) Find a Doctor and Pay Out of Pocket Although you won't have to find out who is covered by your insurance plan, it is a good idea to ask around and get recommendations. You will then need to call the office and see if the doctor you have chosen will accept you as a new patient and what types of options they offer for patients who are self-pay. Some doctors offer discounts or will set up payment plans for their patients who do not have insurance, but you will need to ask so you aren't surprised when you get to your appointment.  2) Contact Your Local Health Department Not all health departments have doctors that can see patients for sick visits, but many do, so it is worth a call to see if yours does. If you don't know where your local health department is, you can check in your phone book. The CDC also has a tool to help you locate your state's health department, and many state websites also have listings of all of their local health departments.  3) Find a Walk-in Clinic If your illness is not likely to be very severe or complicated, you may want to try a walk in clinic. These are popping up all over the country in pharmacies, drugstores, and shopping centers. They're usually staffed by nurse practitioners or physician assistants that have been trained to treat common illnesses and complaints. They're usually fairly quick and inexpensive. However, if you have serious medical issues or chronic medical problems, these are probably not your best option.  No Primary Care Doctor: - Call Health Connect at  [phone] - they can help you locate a primary care doctor that  accepts your insurance, provides certain services, etc. - Physician Referral Service- [phone]  Chronic Pain Problems: Organization         Address  Phone   Notes  [facility]  [phone] Patients need to be referred by their primary care  doctor.   Medication Assistance: Organization         Address  Phone   Notes  [facility] [address] [place], [place] [phone]) [phone] --Must be a resident of [facility] -- Must have NO insurance coverage whatsoever (no Medicaid/ Medicare, etc.) -- The pt. MUST have a primary care doctor that directs their care regularly and follows them in the community   Med[facility]ist  [phone]   [employer]  [phone]    Agencies that provide inexpensive medical care: Organization         Address  Phone   Notes  [facility]  [phone]   [facility]    [phone]   [facility] [address] [place], [place] [address] [phone]   [facility] [address], [place] [phone]) [phone]   [facility]anned [facility]    [phone]   [facility]    [phone]) [phone]   [facility]  [address], [place] Phone:  [phone],  Fax:  [phone] Hours of Operation:  9 am - 6 pm, M-F.  Also accepts Medicaid/Medicare and self-pay.  [facility]  [address], [place] Phone: [phone], Fax: [phone]. Hours of Operation:  8:30 am - 5:30 pm, M-F.  Also accepts Medicaid and self-pay.  [facility] [place] [address] [place] Phone: [phone]   [facility] [address] Natasha Bence [place], [place] [phone], Ext. [phone] Mondays & Thursdays: 7-9 AM.  First 15 patients are seen on a first come, first serve basis.    Medicaid-accepting [facility] Providers:  Organization         Address  Phone   Notes  [facility] [address], [place] [phone] Also accepts self-pay patients.  [facility] [address] Laurell Josephs [place], [place]  [phone]   [facility] [address], [place] [phone]   [facility] [address], [place] [phone]   Renaye Rakers [address], [place]   [phone] Only accepts [place] Access [place] patients after they have their name applied to their card.   Self-Pay (no insurance) in [facility]:  Organization         Address  Phone   Notes  Sickle Cell Patients, [facility] [address] [place], [place] [phone]   [facility] [address] [place], [place] [phone]   [facility] [place]  [address], [place] [phone]   [facility]/Dr. Osei-Bonsu  [address] [place] or [address], [place] [phone] Phone number for both [place] and [place] locations is the same.  [facility] [address] [place] [phone]   [facility] [address], [place] or [address] [phone]   [facility] [address] [place] [phone], phone; [phone], fax Sees patients 1st and 3rd Saturday of every month.  Must not qualify for public or private insurance (i.e. Medicaid, Medicare, [place] Health Choice, Veterans' Benefits)  Household income should be no more than 200% of the poverty level The clinic cannot treat you if you are pregnant or think you are pregnant  Sexually transmitted diseases are not treated at the clinic.    Dental Care: Organization         Address  Phone  Notes  [facility] of [facility] [address] [place], [place] [phone] Accepts children up to age 62 who are enrolled in [place] or [place] Health Choice; pregnant women with a Medicaid card; and children who have applied for Medicaid or [place] Health Choice, but were declined, whose parents can pay a reduced fee at time of  service.  [facility] of [facility]  [address], [place] [phone] Accepts children up to age 24 who are enrolled in [place] or [place] Health Choice; pregnant women with a Medicaid card; and children who have applied for Medicaid or  Health Choice, but were declined, whose parents can pay a reduced fee at time of service.  [facility]  [address] [place], [place] [phone] Patients are seen by appointment only. Walk-ins are not  accepted. [facility] will see patients 9 years of age and older. Monday - Tuesday (8am-5pm) Most Wednesdays (8:30-5pm) $30 per visit, cash only  [facility] Access [facility]  [address], [facility] [phone] Patients are seen by appointment only. Walk-ins are not accepted. [facility] will see patients 75 years of age and older. One Wednesday Evening (Monthly: Volunteer Based).  $30 per visit, cash only  [employer]  [phone] for adults; Children under age 67, call Graduate Pediatric Dentistry at [phone]. Children aged 20-14, please call [phone] to request a pediatric application.  Dental services are provided in all areas of dental care including fillings, crowns and bridges, complete and partial dentures, implants, gum treatment, root canals, and extractions. Preventive care is also provided. Treatment is provided to both adults and children. Patients are selected via a lottery and there is often a waiting list.   [facility] [address] [place]  [phone] [email or web address]   [facility] [address] [place], [place] [phone], Ext. [phone] Second and Fourth Thursday of each month, opens at 6:30 AM; Clinic ends at 9 AM.  Patients are seen on a first-come first-served basis, and a limited number are seen during each clinic.   [facility]  [address] Ether Griffins [place],  [place] [phone]   Eligibility Requirements You must have lived in [place], [place], or [place] counties for at least the last three months.   You cannot be eligible for state or federal sponsored [place], including [employer], [place], or [employer].   You generally cannot be eligible for healthcare insurance through your employer.    How to apply: Eligibility screenings are held every Tuesday and Wednesday afternoon from 1:00 pm until 4:00 pm. You do not need an appointment for the interview!  [facility] [address] [place], [place] [phone]   [facility]  [phone]   [facility]  [phone]   [facility]  [phone]    Behavioral Health Resources in the Community: Intensive Outpatient Programs Organization         Address  Phone  Notes  [facility] [address] [place], [place] [phone]   [facility] [address] [place], [place] [phone]   [facility] [address], [place], [place]  [phone]   [facility] [address]  [place], [place] [phone] or [phone]   Substance Abuse Resources Organization         Address  Phone  Notes  [facility] Drug [facility]  [phone]   [facility]  [phone]   [facility]  3132081996   Floydene Flock  [phone]   Residential & Outpatient Substance Abuse Program  [phone]   Psychological Services Organization         Address  Phone  Notes  [facility] Health  [phone]   [facility]  [phone]   [facility] Health [address] [place] [phone] or [phone]    Mobile Crisis Teams Organization         Address  Phone  Notes  [facility], Mobile Crisis Care Unit  [phone]   [facility]  [address] [place], [place] [phone]   [facility] [address] [place] [phone]    Self-Help/Support Groups  Organization         Address  Phone             Notes  [facility] [place] - variety of support groups  [phone]- I7437963 Call for more information  Narcotics Anonymous (NA), [facility] [address], [employer] [place]  2 meetings at this location   Statistician         Address  Phone  Notes  [facility] [address],    [place]  [phone]   [facility]  [address], [place] [address], [place], [place] [phone]   [facility] [address] [place], [place] [phone] Admissions: 8am-3pm M-F  [facility] [address],    [place], [place] [phone]   [facility] [address] [place], [place], [place] [phone]   [facility] [address]  [place], [place] [phone]   [facility] - Intensive Outpatient [address] Laurell Joseph[address], [place], [place] [phone]   [facility] (Addiction Recovery [facility] [address]  [place], [place] [phone] or [phone]   [facility] ([facility]) [address] [place], [place] [phone] Accepts Medicaid  [facility] [place] [address]  [place] [phone] Substance Abuse/Addiction Treatment   [facility] Organization         Address  Phone  Notes  [facility]  [phone]   Angie Fava, PhD [address] Ervin Knack [place], [place]   [phone] or 7322027243   [facility] Behavioral   [address] [place], [place] [phone]   [facility] [address] [place], [place] [phone] Insurance/Medicaid/sponsorship through [facility] and Families [address]                                    [place], [place] [phone]  Therapy/tele-psych/case  [facility] [address], [place] [phone]    Dr. Lolly Mustache  [phone]   [facility]  United Way [facility] Dept. 1) [address] [place] 2) [address] [place] 3)  [address], [place] [phone]  [phone]   [facility] Child Abuse Hotline 872-096-7884 or [phone] (After Hours)

## 2014-07-22 NOTE — ED Notes (Signed)
MD at bedside. 

## 2014-07-22 NOTE — ED Provider Notes (Signed)
CSN: [id number]     Arrival date & time 07/22/14  1116 History   First MD Initiated Contact with Patient 07/22/14 1131     Chief Complaint  Patient presents with  . Back Pain    Rachael Ramirez is a 44 y.o. female with a history of chronic low back pain and previous drug overdose who presents to the ED complaining of worsening chronic low back pain. Patient also reports a history of sciatica and is having left posterior shooting leg pain. She reports some numbness and tingling in her left leg. Patient reports her low back pain is at a 10 out of 10 in her bilateral back and worse with movement. Patient reports she is not able to normally walk on her own. Patient reports she had been taking Tylenol 3 but they have not been working for her shift she threw them all away.  The patient also reports taking Lyrica for her pain. Patient reports some nausea with her pain but denies any vomiting. Patient denies history of cancer or IV drug use. Patient denies loss of bowel or bladder control. Patient denies fevers, chills, recent illness, dysuria, hematuria, vomiting, constipation, or diarrhea.  (Consider location/radiation/quality/duration/timing/severity/associated sxs/prior Treatment) HPI  Past Medical History  Diagnosis Date  . Back pain   . Asthma     uses Combivent daily as needed  . Insomnia     takes Trazodone nightly  . Anxiety     takes Klonopin daily as needed  . PONV (postoperative nausea and vomiting)   . Cough   . Spinal headache   . Weakness     left leg  . GERD (gastroesophageal reflux disease)     takes Omeprazole daily  . Urinary urgency   . Bipolar 1 disorder     takes Lamictal daily  . Chronic low back pain     HNP and Radiculopathy  . MRSA (methicillin resistant Staphylococcus aureus)     Postive nasal swab  . Hypertension   . Collapsed lung 3/15    "while in hospital awaiting back OR"   Past Surgical History  Procedure Laterality Date  . Percutaneous pinning Right  1992    "hand"  . Anterior cervical decomp/discectomy fusion  1996    "took bone off her hip"  . Elbow fracture surgery Left 1975  . Elbow fracture surgery Left 2002    "redo"  . Lumbar disc surgery  2008 X 2  . Posterior lumbar fusion  2008  . Hardware removal  2008    "took screw out of lower back"  . Epidural block injection    . Lumbar laminectomy/decompression microdiscectomy Left 07/13/2013    Procedure: Left Lumbar four-five microdiskectomy;  Surgeon: Carmela Hurt, MD;  Location: [facility];  Service: Neurosurgery;  Laterality: Left;  Left Lumbar four-five microdiskectomy  . Lumbar wound debridement N/A 09/21/2013    Procedure: LUMBAR WOUND re-exploration and disectomy;  Surgeon: Carmela Hurt, MD;  Location: [facility];  Service: Neurosurgery;  Laterality: N/A;  . Tonsillectomy    . Fracture surgery    . Back surgery     Family History  Problem Relation Age of Onset  . Multiple sclerosis Mother   . COPD Father   . Alcohol abuse Maternal Grandfather   . Alcohol abuse Brother   . Cirrhosis Brother    History  Substance Use Topics  . Smoking status: Current Every Day Smoker -- 1.00 packs/day for 18 years    Types: Cigarettes  .  Smokeless tobacco: Never Used  . Alcohol Use: No   OB History    No data available     Review of Systems  Constitutional: Negative for fever and chills.  HENT: Negative for congestion and sore throat.   Eyes: Negative for visual disturbance.  Respiratory: Negative for cough, shortness of breath and wheezing.   Cardiovascular: Negative for chest pain and palpitations.  Gastrointestinal: Positive for nausea. Negative for vomiting, abdominal pain and diarrhea.  Genitourinary: Negative for dysuria and hematuria.  Musculoskeletal: Positive for back pain. Negative for neck pain.  Skin: Negative for rash.  Neurological: Positive for numbness. Negative for dizziness, light-headedness and headaches.      Allergies  Minocycline  Home  Medications   Prior to Admission medications   Medication Sig Start Date End Date Taking? Authorizing Provider  albuterol-ipratropium (COMBIVENT) 18-103 MCG/ACT inhaler Inhale 1 puff into the lungs every 6 (six) hours as needed for wheezing or shortness of breath. For wheezing    Historical Provider, MD  aspirin-acetaminophen-caffeine (EXCEDRIN MIGRAINE) [phone] MG per tablet Take 2 tablets by mouth every 6 (six) hours as needed for headache.    Historical Provider, MD  clonazePAM (KLONOPIN) 1 MG tablet Take 1 mg by mouth 3 (three) times daily.    Historical Provider, MD  omeprazole (PRILOSEC) 20 MG capsule Take 20 mg by mouth daily.     Historical Provider, MD  ondansetron (ZOFRAN) 8 MG tablet Take 8 mg by mouth every 6 (six) hours as needed for vomiting.  01/31/14   Historical Provider, MD  oxyCODONE (ROXICODONE) 5 MG immediate release tablet Take 1 tablet (5 mg total) by mouth every 4 (four) hours as needed for severe pain. 02/06/14   Melene Plan, MD  pregabalin (LYRICA) 300 MG capsule Take 1 capsule (300 mg total) by mouth at bedtime. 02/12/14   Christiane Ha, MD  pregabalin (LYRICA) 300 MG capsule Take 300 mg by mouth 2 (two) times daily. Or at bedtime    Historical Provider, MD  traMADol (ULTRAM) 50 MG tablet Take 1 tablet (50 mg total) by mouth every 6 (six) hours as needed. 04/22/14   Dione Booze, MD  traZODone (DESYREL) 150 MG tablet Take 0.5 tablets (75 mg total) by mouth at bedtime as needed for sleep. 02/12/14   Christiane Ha, MD   BP 143/86 mmHg  Pulse 84  Temp(Src) 97.9 F (36.6 C) (Oral)  Resp 18  SpO2 94% Physical Exam  Constitutional: She is oriented to person, place, and time. She appears well-developed and well-nourished. No distress.  HENT:  Head: Normocephalic and atraumatic.  Mouth/Throat: Oropharynx is clear and moist. No oropharyngeal exudate.  Wearing dentures  Eyes: Conjunctivae are normal. Pupils are equal, round, and reactive to light. Right eye exhibits  no discharge. Left eye exhibits no discharge.  Neck: Normal range of motion. Neck supple.  Cardiovascular: Normal rate, regular rhythm, normal heart sounds and intact distal pulses.  Exam reveals no gallop and no friction rub.   No murmur heard. Pulmonary/Chest: Effort normal and breath sounds normal. No respiratory distress. She has no wheezes. She has no rales.  Abdominal: Soft. Bowel sounds are normal. She exhibits no distension. There is no tenderness.  Musculoskeletal: She exhibits no edema.  Patient has tenderness palpation to bilateral low back. Scar noted to her lumbar spine. Patient is spontaneously moving all extremities in a coordinated fashion exhibiting good strength. No bony point tenderness appreciated. No crepitus, step-offs or deformities noted.   Lymphadenopathy:  She has no cervical adenopathy.  Neurological: She is alert and oriented to person, place, and time. No cranial nerve deficit. Coordination normal.  Patient has good sensation in her bilateral lower extremities.  Skin: Skin is warm and dry. No rash noted. She is not diaphoretic. No erythema. No pallor.  Psychiatric: She has a normal mood and affect. Her behavior is normal.  Nursing note and vitals reviewed.   ED Course  Procedures (including critical care time) Labs Review Labs Reviewed - No data to display  Imaging Review No results found.   EKG Interpretation None      Filed Vitals:   07/22/14 1130 07/22/14 1218 07/22/14 1230 07/22/14 1330  BP: 172/102 157/90 164/89 143/86  Pulse: 78 91 80 84  Temp:      TempSrc:      Resp: 12 19 18 18   SpO2: 97% 96% 95% 94%     MDM   Meds given in ED:  Medications  HYDROmorphone (DILAUDID) injection 1 mg (1 mg Intramuscular Given 07/22/14 1248)  diazepam (VALIUM) tablet 5 mg (5 mg Oral Given 07/22/14 1249)  ketorolac (TORADOL) injection 30 mg (30 mg Intramuscular Given 07/22/14 1249)    Discharge Medication List as of 07/22/2014  1:44 PM      Final  diagnoses:  Chronic low back pain    This is a 44 year old female with a history of chronic low back pain who presented to the emergency department complaining of worsening chronic low back pain. She reports she is prescribed Tylenol No. 3 but reports they were not working and so she threw them away. She reports her pain is 10 out of 10 and worse with movement and she is normally unable to walk without assistance. Patient is neurologically intact. There are no signs of cauda equina. She denies loss of bowel or bladder control or difficulty urinating. I have reviewed records of multiple ED visits for similar or other pain-related complaints, usually with negative workups. I feel that the patient's pain is chronic and cannot be appropriately or safely treated in emergency department setting. I do not feel that providing a prescription for narcotic pain medicine is in this patient's best interest. I have urged patient to follow-up closely with their primary care physician or pain specialist. I have explicitly discussed with the patient on precautions and have reassured him that they can always be seen and evaluated in the emergency department for any condition that they feel is emergent, and that they will be given treatment as the emergency physician feels appropriate and safe, but this may not involve the use of narcotic pain medications. The patient was given the opportunity to voice any further questions or concerns and these were addressed to the best of my ability. She did not want any non-narcotic pain medications.   Patient tells me that she is normally unable to walk and has assistance at home. She also was requiring assistance to use the bathroom during her ED stay. But, after she received medications in the ED, I reiterated that she would not be receiving any more narcotic pain medicine.  She then got up out of the bed on her own and was walking in the room and wanted to leave right away without being  discharged. She is walking without any difficulty or assistance. She agreed to wait for discharge instructions after speaking with her further. Patient provided resource list.   This patient was discussed with Dr. Freida BusmanAllen who agrees with assessment and plan.  Lawana Chambers, PA-C 07/22/14 1742  Toy Baker, MD 07/26/14 [phone]

## 2014-07-23 ENCOUNTER — Emergency Department (HOSPITAL_COMMUNITY)
Admission: EM | Admit: 2014-07-23 | Discharge: 2014-07-24 | Disposition: A | Discharge status: Home / Self Care | Payer: Managed Care, Other (non HMO) | Attending: Emergency Medicine | Admitting: Emergency Medicine

## 2014-07-23 ENCOUNTER — Emergency Department (HOSPITAL_COMMUNITY): Payer: Managed Care, Other (non HMO)

## 2014-07-23 DIAGNOSIS — S0081XA Abrasion of other part of head, initial encounter: Secondary | ICD-10-CM | POA: Insufficient documentation

## 2014-07-23 DIAGNOSIS — Y92009 Unspecified place in unspecified non-institutional (private) residence as the place of occurrence of the external cause: Secondary | ICD-10-CM | POA: Insufficient documentation

## 2014-07-23 DIAGNOSIS — Z79899 Other long term (current) drug therapy: Secondary | ICD-10-CM | POA: Diagnosis not present

## 2014-07-23 DIAGNOSIS — F319 Bipolar disorder, unspecified: Secondary | ICD-10-CM | POA: Diagnosis not present

## 2014-07-23 DIAGNOSIS — S71111A Laceration without foreign body, right thigh, initial encounter: Secondary | ICD-10-CM | POA: Diagnosis not present

## 2014-07-23 DIAGNOSIS — S3991XA Unspecified injury of abdomen, initial encounter: Secondary | ICD-10-CM | POA: Diagnosis not present

## 2014-07-23 DIAGNOSIS — Y9389 Activity, other specified: Secondary | ICD-10-CM | POA: Insufficient documentation

## 2014-07-23 DIAGNOSIS — G8929 Other chronic pain: Secondary | ICD-10-CM | POA: Diagnosis not present

## 2014-07-23 DIAGNOSIS — Y998 Other external cause status: Secondary | ICD-10-CM | POA: Diagnosis not present

## 2014-07-23 DIAGNOSIS — K219 Gastro-esophageal reflux disease without esophagitis: Secondary | ICD-10-CM | POA: Diagnosis not present

## 2014-07-23 DIAGNOSIS — I10 Essential (primary) hypertension: Secondary | ICD-10-CM | POA: Diagnosis not present

## 2014-07-23 DIAGNOSIS — Z720 Tobacco use: Secondary | ICD-10-CM | POA: Diagnosis not present

## 2014-07-23 DIAGNOSIS — Z8614 Personal history of Methicillin resistant Staphylococcus aureus infection: Secondary | ICD-10-CM | POA: Diagnosis not present

## 2014-07-23 DIAGNOSIS — Z3202 Encounter for pregnancy test, result negative: Secondary | ICD-10-CM | POA: Diagnosis not present

## 2014-07-23 DIAGNOSIS — T7421XA Adult sexual abuse, confirmed, initial encounter: Secondary | ICD-10-CM | POA: Diagnosis not present

## 2014-07-23 DIAGNOSIS — F419 Anxiety disorder, unspecified: Secondary | ICD-10-CM | POA: Insufficient documentation

## 2014-07-23 DIAGNOSIS — S3992XA Unspecified injury of lower back, initial encounter: Secondary | ICD-10-CM | POA: Insufficient documentation

## 2014-07-23 DIAGNOSIS — S71112A Laceration without foreign body, left thigh, initial encounter: Secondary | ICD-10-CM | POA: Insufficient documentation

## 2014-07-23 DIAGNOSIS — J45909 Unspecified asthma, uncomplicated: Secondary | ICD-10-CM | POA: Diagnosis not present

## 2014-07-23 DIAGNOSIS — M545 Low back pain, unspecified: Secondary | ICD-10-CM

## 2014-07-23 DIAGNOSIS — T148XXA Other injury of unspecified body region, initial encounter: Secondary | ICD-10-CM

## 2014-07-23 LAB — URINE MICROSCOPIC-ADD ON

## 2014-07-23 LAB — URINALYSIS, ROUTINE W REFLEX MICROSCOPIC
Bilirubin Urine: NEGATIVE
GLUCOSE, UA: NEGATIVE mg/dL
KETONES UR: 40 mg/dL — AB
Nitrite: NEGATIVE
Protein, ur: NEGATIVE mg/dL
Specific Gravity, Urine: 1.015 (ref 1.005–1.030)
Urobilinogen, UA: 1 mg/dL (ref 0.0–1.0)
pH: 7.5 (ref 5.0–8.0)

## 2014-07-23 LAB — POC URINE PREG, ED: Preg Test, Ur: NEGATIVE

## 2014-07-23 MED ORDER — HYDROCODONE-ACETAMINOPHEN 5-325 MG PO TABS
1.0000 | ORAL_TABLET | Freq: Once | ORAL | Status: AC
  Administered 2014-07-23: 1 via ORAL
  Filled 2014-07-23: qty 1

## 2014-07-23 MED ORDER — KETOROLAC TROMETHAMINE 30 MG/ML IJ SOLN
30.0000 mg | Freq: Once | INTRAMUSCULAR | Status: DC
  Filled 2014-07-23: qty 1

## 2014-07-23 MED ORDER — KETOROLAC TROMETHAMINE 30 MG/ML IJ SOLN
30.0000 mg | Freq: Once | INTRAMUSCULAR | Status: AC
  Administered 2014-07-23: 30 mg via INTRAMUSCULAR

## 2014-07-23 NOTE — ED Notes (Signed)
Pt requesting to leave, states "I just can't stand the back pain and need more medicine" - discussed w/ pt that she needs a safe ride home as she has already been given narcotics. Pt verbalized understanding and pt provided w/ her purse so she may call a ride w/ her phone. Social worker at bedside currently. Terri Piedraourtney Forcucci PAC made aware of pt's request to leave. PA at bedside to speak w/ pt.

## 2014-07-23 NOTE — ED Notes (Signed)
Pt now stating she wants to leave, does not want her husband to know. Tech talking with pt

## 2014-07-23 NOTE — ED Notes (Signed)
SANE RN at bedside.

## 2014-07-23 NOTE — ED Notes (Signed)
Pt fell while leaving her room ambulating towards nurses station - pt fell to her right side, states she experienced an episode of weakness. Terri Piedraourtney Forcucci, PAC made aware. Pt w/o LOC - no obvious injuries, pt did not hit her head during fall - pt assisted back to bed. Tiffancy, Consulting civil engineerCharge RN made aware. Fall witnessed by this RN.
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
## 2014-07-23 NOTE — ED Notes (Signed)
Deputy at bedside, awaiting sane nurse

## 2014-07-23 NOTE — ED Notes (Signed)
Pt agreeable to plan of care for IM injection of Toradol and waiting for SANE nurse.
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
## 2014-07-23 NOTE — ED Notes (Signed)
Spoke w/ SANE RN on phone - RN to arrive in approx 1hr
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
## 2014-07-23 NOTE — ED Notes (Signed)
Hand bag in locker 31

## 2014-07-23 NOTE — ED Notes (Signed)
MD at bedside. 

## 2014-07-24 ENCOUNTER — Encounter (HOSPITAL_COMMUNITY): Payer: Self-pay | Admitting: *Deleted

## 2014-07-24 LAB — WET PREP, GENITAL
CLUE CELLS WET PREP: NONE SEEN
TRICH WET PREP: NONE SEEN
YEAST WET PREP: NONE SEEN

## 2014-07-24 LAB — GC/CHLAMYDIA PROBE AMP (~~LOC~~) NOT AT ARMC
Chlamydia: NEGATIVE
Neisseria Gonorrhea: NEGATIVE

## 2014-07-24 MED ORDER — IBUPROFEN 800 MG PO TABS: 800.0000 mg | ORAL_TABLET | Freq: Three times a day (TID) | ORAL | Status: DC

## 2014-07-24 MED ORDER — DIAZEPAM 5 MG PO TABS: 5.0000 mg | ORAL_TABLET | Freq: Once | ORAL | Status: DC

## 2014-07-24 MED ORDER — PROMETHAZINE HCL 25 MG PO TABS
ORAL_TABLET | ORAL | Status: AC
  Filled 2014-07-24: qty 3

## 2014-07-24 MED ORDER — METRONIDAZOLE 500 MG PO TABS
ORAL_TABLET | ORAL | Status: AC
  Filled 2014-07-24: qty 4

## 2014-07-24 MED ORDER — ULIPRISTAL ACETATE 30 MG PO TABS
ORAL_TABLET | ORAL | Status: AC
  Filled 2014-07-24: qty 1

## 2014-07-24 MED ORDER — AZITHROMYCIN 1 G PO PACK
PACK | ORAL | Status: AC
  Filled 2014-07-24: qty 1

## 2014-07-24 MED ORDER — CEFIXIME 400 MG PO TABS
ORAL_TABLET | ORAL | Status: DC
Start: 2014-07-24 — End: 2014-07-24
  Filled 2014-07-24: qty 1

## 2014-07-24 NOTE — ED Notes (Signed)
SANE kit provided to [employer]

## 2014-07-24 NOTE — ED Notes (Signed)
Pt refusing to accept her discharge instructions - states she just wants to leave. Pt assisted to discharge via wheel chair and escorted out by Johny Drillinghan, NT. Pt's husband to pick pt up and drive her home.
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
## 2014-09-13 ENCOUNTER — Emergency Department (HOSPITAL_COMMUNITY)
Admission: EM | Admit: 2014-09-13 | Discharge: 2014-09-13 | Disposition: A | Discharge status: Home / Self Care | Payer: Managed Care, Other (non HMO) | Attending: Emergency Medicine | Admitting: Emergency Medicine

## 2014-09-13 ENCOUNTER — Encounter (HOSPITAL_COMMUNITY): Payer: Self-pay | Admitting: Emergency Medicine

## 2014-09-13 ENCOUNTER — Emergency Department (HOSPITAL_COMMUNITY): Payer: Managed Care, Other (non HMO)

## 2014-09-13 DIAGNOSIS — Z720 Tobacco use: Secondary | ICD-10-CM | POA: Insufficient documentation

## 2014-09-13 DIAGNOSIS — I10 Essential (primary) hypertension: Secondary | ICD-10-CM | POA: Insufficient documentation

## 2014-09-13 DIAGNOSIS — G8929 Other chronic pain: Secondary | ICD-10-CM | POA: Diagnosis not present

## 2014-09-13 DIAGNOSIS — Z8614 Personal history of Methicillin resistant Staphylococcus aureus infection: Secondary | ICD-10-CM | POA: Insufficient documentation

## 2014-09-13 DIAGNOSIS — S0990XA Unspecified injury of head, initial encounter: Secondary | ICD-10-CM | POA: Diagnosis present

## 2014-09-13 DIAGNOSIS — Y939 Activity, unspecified: Secondary | ICD-10-CM | POA: Diagnosis not present

## 2014-09-13 DIAGNOSIS — Y92009 Unspecified place in unspecified non-institutional (private) residence as the place of occurrence of the external cause: Secondary | ICD-10-CM | POA: Insufficient documentation

## 2014-09-13 DIAGNOSIS — Y999 Unspecified external cause status: Secondary | ICD-10-CM | POA: Diagnosis not present

## 2014-09-13 DIAGNOSIS — G4700 Insomnia, unspecified: Secondary | ICD-10-CM | POA: Insufficient documentation

## 2014-09-13 DIAGNOSIS — R112 Nausea with vomiting, unspecified: Secondary | ICD-10-CM | POA: Insufficient documentation

## 2014-09-13 DIAGNOSIS — Z79899 Other long term (current) drug therapy: Secondary | ICD-10-CM | POA: Insufficient documentation

## 2014-09-13 DIAGNOSIS — K219 Gastro-esophageal reflux disease without esophagitis: Secondary | ICD-10-CM | POA: Diagnosis not present

## 2014-09-13 DIAGNOSIS — J45909 Unspecified asthma, uncomplicated: Secondary | ICD-10-CM | POA: Diagnosis not present

## 2014-09-13 DIAGNOSIS — F419 Anxiety disorder, unspecified: Secondary | ICD-10-CM | POA: Diagnosis not present

## 2014-09-13 DIAGNOSIS — F319 Bipolar disorder, unspecified: Secondary | ICD-10-CM | POA: Insufficient documentation

## 2014-09-13 DIAGNOSIS — S0011XA Contusion of right eyelid and periocular area, initial encounter: Secondary | ICD-10-CM | POA: Insufficient documentation

## 2014-09-13 DIAGNOSIS — S0511XA Contusion of eyeball and orbital tissues, right eye, initial encounter: Secondary | ICD-10-CM
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
## 2014-09-13 NOTE — ED Notes (Signed)
Patient transported to CT 
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
## 2014-09-13 NOTE — ED Notes (Signed)
Patient returned from CT
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
## 2014-11-06 ENCOUNTER — Encounter (HOSPITAL_COMMUNITY): Payer: Self-pay | Admitting: *Deleted

## 2014-11-06 ENCOUNTER — Emergency Department (HOSPITAL_COMMUNITY)
Admission: EM | Admit: 2014-11-06 | Discharge: 2014-11-10 | Disposition: A | Discharge status: Home / Self Care | Payer: Managed Care, Other (non HMO) | Attending: Emergency Medicine | Admitting: Emergency Medicine

## 2014-11-06 DIAGNOSIS — Z720 Tobacco use: Secondary | ICD-10-CM | POA: Diagnosis not present

## 2014-11-06 DIAGNOSIS — F445 Conversion disorder with seizures or convulsions: Secondary | ICD-10-CM

## 2014-11-06 DIAGNOSIS — F32A Depression, unspecified: Secondary | ICD-10-CM | POA: Diagnosis present

## 2014-11-06 DIAGNOSIS — F419 Anxiety disorder, unspecified: Secondary | ICD-10-CM | POA: Diagnosis not present

## 2014-11-06 DIAGNOSIS — T43594A Poisoning by other antipsychotics and neuroleptics, undetermined, initial encounter: Secondary | ICD-10-CM | POA: Insufficient documentation

## 2014-11-06 DIAGNOSIS — Z79899 Other long term (current) drug therapy: Secondary | ICD-10-CM | POA: Diagnosis not present

## 2014-11-06 DIAGNOSIS — T50904A Poisoning by unspecified drugs, medicaments and biological substances, undetermined, initial encounter: Secondary | ICD-10-CM

## 2014-11-06 DIAGNOSIS — Z791 Long term (current) use of non-steroidal anti-inflammatories (NSAID): Secondary | ICD-10-CM | POA: Diagnosis not present

## 2014-11-06 DIAGNOSIS — Z3202 Encounter for pregnancy test, result negative: Secondary | ICD-10-CM | POA: Insufficient documentation

## 2014-11-06 DIAGNOSIS — G8929 Other chronic pain: Secondary | ICD-10-CM | POA: Diagnosis not present

## 2014-11-06 DIAGNOSIS — X58XXXA Exposure to other specified factors, initial encounter: Secondary | ICD-10-CM | POA: Insufficient documentation

## 2014-11-06 DIAGNOSIS — G4700 Insomnia, unspecified: Secondary | ICD-10-CM | POA: Diagnosis not present

## 2014-11-06 DIAGNOSIS — R569 Unspecified convulsions: Secondary | ICD-10-CM | POA: Insufficient documentation

## 2014-11-06 DIAGNOSIS — J45909 Unspecified asthma, uncomplicated: Secondary | ICD-10-CM | POA: Diagnosis not present

## 2014-11-06 DIAGNOSIS — Y9289 Other specified places as the place of occurrence of the external cause: Secondary | ICD-10-CM | POA: Diagnosis not present

## 2014-11-06 DIAGNOSIS — F329 Major depressive disorder, single episode, unspecified: Secondary | ICD-10-CM | POA: Diagnosis not present

## 2014-11-06 DIAGNOSIS — F319 Bipolar disorder, unspecified: Secondary | ICD-10-CM | POA: Diagnosis not present

## 2014-11-06 DIAGNOSIS — I10 Essential (primary) hypertension: Secondary | ICD-10-CM | POA: Diagnosis not present

## 2014-11-06 DIAGNOSIS — F332 Major depressive disorder, recurrent severe without psychotic features: Secondary | ICD-10-CM | POA: Diagnosis not present

## 2014-11-06 DIAGNOSIS — T424X4A Poisoning by benzodiazepines, undetermined, initial encounter: Secondary | ICD-10-CM | POA: Insufficient documentation

## 2014-11-06 DIAGNOSIS — Z8614 Personal history of Methicillin resistant Staphylococcus aureus infection: Secondary | ICD-10-CM | POA: Insufficient documentation

## 2014-11-06 DIAGNOSIS — S0011XA Contusion of right eyelid and periocular area, initial encounter: Secondary | ICD-10-CM | POA: Insufficient documentation

## 2014-11-06 DIAGNOSIS — Y9389 Activity, other specified: Secondary | ICD-10-CM | POA: Diagnosis not present

## 2014-11-06 DIAGNOSIS — T43592A Poisoning by other antipsychotics and neuroleptics, intentional self-harm, initial encounter: Secondary | ICD-10-CM | POA: Diagnosis not present

## 2014-11-06 DIAGNOSIS — K219 Gastro-esophageal reflux disease without esophagitis: Secondary | ICD-10-CM | POA: Diagnosis not present

## 2014-11-06 DIAGNOSIS — Z8739 Personal history of other diseases of the musculoskeletal system and connective tissue: Secondary | ICD-10-CM | POA: Insufficient documentation

## 2014-11-06 DIAGNOSIS — Y998 Other external cause status: Secondary | ICD-10-CM | POA: Insufficient documentation

## 2014-11-06 DIAGNOSIS — T424X2A Poisoning by benzodiazepines, intentional self-harm, initial encounter: Secondary | ICD-10-CM | POA: Diagnosis not present

## 2014-11-06 LAB — COMPREHENSIVE METABOLIC PANEL
ALT: 9 U/L — ABNORMAL LOW (ref 14–54)
ANION GAP: 4 — AB (ref 5–15)
AST: 14 U/L — ABNORMAL LOW (ref 15–41)
Albumin: 3.6 g/dL (ref 3.5–5.0)
Alkaline Phosphatase: 78 U/L (ref 38–126)
BILIRUBIN TOTAL: 0.3 mg/dL (ref 0.3–1.2)
BUN: 5 mg/dL — AB (ref 6–20)
CHLORIDE: 112 mmol/L — AB (ref 101–111)
CO2: 24 mmol/L (ref 22–32)
Calcium: 8.8 mg/dL — ABNORMAL LOW (ref 8.9–10.3)
Creatinine, Ser: 0.62 mg/dL (ref 0.44–1.00)
GFR calc Af Amer: >60 mL/min (ref >60)
GLUCOSE: 87 mg/dL (ref 70–99)
POTASSIUM: 3.5 mmol/L (ref 3.5–5.1)
Sodium: 140 mmol/L (ref 135–145)
Total Protein: 6.5 g/dL (ref 6.5–8.1)

## 2014-11-06 LAB — CBC
HEMATOCRIT: 37.6 % (ref 36.0–46.0)
Hemoglobin: 12.2 g/dL (ref 12.0–15.0)
MCH: 31.6 pg (ref 26.0–34.0)
MCHC: 32.4 g/dL (ref 30.0–36.0)
MCV: 97.4 fL (ref 78.0–100.0)
Platelets: 275 10*3/uL (ref 150–400)
RBC: 3.86 MIL/uL — AB (ref 3.87–5.11)
RDW: 14 % (ref 11.5–15.5)
WBC: 7 10*3/uL (ref 4.0–10.5)

## 2014-11-06 LAB — RAPID URINE DRUG SCREEN, HOSP PERFORMED
Amphetamines: NOT DETECTED
BARBITURATES: NOT DETECTED
BENZODIAZEPINES: POSITIVE — AB
Cocaine: NOT DETECTED
Opiates: NOT DETECTED
Tetrahydrocannabinol: NOT DETECTED

## 2014-11-06 LAB — SALICYLATE LEVEL: Salicylate Lvl: <4 mg/dL (ref 2.8–30.0)

## 2014-11-06 LAB — ACETAMINOPHEN LEVEL: Acetaminophen (Tylenol), Serum: <10 ug/mL — ABNORMAL LOW (ref 10–30)

## 2014-11-06 LAB — ETHANOL: Alcohol, Ethyl (B): <5 mg/dL (ref <5)

## 2014-11-06 LAB — MAGNESIUM: Magnesium: 1.9 mg/dL (ref 1.7–2.4)

## 2014-11-06 MED ORDER — ACETAMINOPHEN 325 MG PO TABS
650.0000 mg | ORAL_TABLET | ORAL | Status: DC | PRN
  Administered 2014-11-08: 650 mg via ORAL
  Filled 2014-11-06 (×2): qty 2

## 2014-11-06 NOTE — ED Notes (Signed)
Bed: WA12 Expected date:  Expected time:  Means of arrival:  Comments: EMS 

## 2014-11-06 NOTE — ED Notes (Signed)
Poison Control notified of patient's ingestion.   Their recommendations include: Monitor for at least 6 hours Check Potassium and Magnesium levels and correct as needed Hydrate with IV fluids

## 2014-11-06 NOTE — BH Assessment (Signed)
Review of EDP note prior to initiating assessment. Per Dr. Delford FieldKnapp's note:The patient presents to the emergency room after taking excess Klonopin and Seroquel. Patient is post to take one Klonopin 3 times a day when necessary and 50 mg Seroquel at bedtime. The patient states that she was not sleeping well and she decided to take extra Klonopin and Seroquel. She took total of 7 Klonopin's and 300 mg of Seroquel at 5 PM today. Patient denies that she was trying to harm herself. However, she called her AA sponsor after taking those medications and told her what she did. Her sponsor called 911. Patient denies suicidal or homicidal ideation. She does have a history of bipolar disorder and prior drug overdose.  He is concerned pt may have been trying to harm herself.   Assessment to commence shortly.    Clista BernhardtNancy Anely Spiewak, [facility] CenterPC Triage Specialist 11/06/2014 11:37 PM
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
## 2014-11-06 NOTE — ED Notes (Signed)
Pt reported to this nurse that she took 10 klonopins and 300 mg seroquels.  States that she was only supposed to take 1 klonopin and 50mg  seroquel

## 2014-11-06 NOTE — BH Assessment (Signed)
Attempted to assess pt. Pt was very drowsy, unable to keep eyes open and with very slurred speech. Informed Dr. Lynelle DoctorKnapp and will attempt to assess later in AM.   Pt had significant overdose, but denies intent at self-harm. An AM psych evaluation will likely be warranted. Disposition pending assessment when pt is more alert and able to participate and provide need information.   Clista BernhardtNancy Lucill Mauck, [facility] Triage Specialist 11/06/2014 11:52 PM

## 2014-11-06 NOTE — ED Provider Notes (Signed)
CSN: [id number]     Arrival date & time 11/06/14  2029 History   First MD Initiated Contact with Patient 11/06/14 2041     Chief Complaint  Patient presents with  . Ingestion   HPI The patient presents to the emergency room after taking excess Klonopin and Seroquel. Patient is post to take one Klonopin 3 times a day when necessary and 50 mg Seroquel at bedtime. The patient states that she was not sleeping well and she decided to take extra Klonopin and Seroquel. She took total of 7 Klonopin's and 300 mg of Seroquel at 5 PM today. Patient denies that she was trying to harm herself. However, she called her AA sponsor after taking those medications and told her what she did. Her sponsor called 911. Patient denies suicidal or homicidal ideation. She does have a history of bipolar disorder and prior drug overdose. Past Medical History  Diagnosis Date  . Back pain   . Asthma     uses Combivent daily as needed  . Insomnia     takes Trazodone nightly  . Anxiety     takes Klonopin daily as needed  . PONV (postoperative nausea and vomiting)   . Cough   . Spinal headache   . Weakness     left leg  . GERD (gastroesophageal reflux disease)     takes Omeprazole daily  . Urinary urgency   . Bipolar 1 disorder     takes Lamictal daily  . Chronic low back pain     HNP and Radiculopathy  . MRSA (methicillin resistant Staphylococcus aureus)     Postive nasal swab  . Hypertension   . Collapsed lung 3/15    "while in hospital awaiting back OR"   Past Surgical History  Procedure Laterality Date  . Percutaneous pinning Right 1992    "hand"  . Anterior cervical decomp/discectomy fusion  1996    "took bone off her hip"  . Elbow fracture surgery Left 1975  . Elbow fracture surgery Left 2002    "redo"  . Lumbar disc surgery  2008 X 2  . Posterior lumbar fusion  2008  . Hardware removal  2008    "took screw out of lower back"  . Epidural block injection    . Lumbar laminectomy/decompression  microdiscectomy Left 07/13/2013    Procedure: Left Lumbar four-five microdiskectomy;  Surgeon: Carmela HurtKyle L Cabbell, MD;  Location: [facility];  Service: Neurosurgery;  Laterality: Left;  Left Lumbar four-five microdiskectomy  . Lumbar wound debridement N/A 09/21/2013    Procedure: LUMBAR WOUND re-exploration and disectomy;  Surgeon: Carmela HurtKyle L Cabbell, MD;  Location: [facility];  Service: Neurosurgery;  Laterality: N/A;  . Tonsillectomy    . Fracture surgery    . Back surgery     Family History  Problem Relation Age of Onset  . Multiple sclerosis Mother   . COPD Father   . Alcohol abuse Maternal Grandfather   . Alcohol abuse Brother   . Cirrhosis Brother    History  Substance Use Topics  . Smoking status: Current Every Day Smoker -- 1.00 packs/day for 18 years    Types: Cigarettes  . Smokeless tobacco: Never Used  . Alcohol Use: No   OB History    No data available     Review of Systems  All other systems reviewed and are negative.     Allergies  Minocycline  Home Medications   Prior to Admission medications   Medication Sig Start Date  End Date Taking? Authorizing Provider  clonazePAM (KLONOPIN) 1 MG tablet Take 1 mg by mouth 3 (three) times daily.   Yes Historical Provider, MD  QUEtiapine (SEROQUEL) 50 MG tablet Take 1 tablet by mouth at bedtime. 10/09/14  Yes Historical Provider, MD  albuterol-ipratropium (COMBIVENT) 18-103 MCG/ACT inhaler Inhale 2 puffs into the lungs every 6 (six) hours as needed for wheezing or shortness of breath (wheezing). For wheezing    Historical Provider, MD  aspirin-acetaminophen-caffeine (EXCEDRIN MIGRAINE) [phone] 0477250-250-65 MG per tablet Take 2 tablets by mouth every 6 (six) hours as needed for headache.    Historical Provider, MD  ibuprofen (ADVIL,MOTRIN) 800 MG tablet Take 1 tablet (800 mg total) by mouth 3 (three) times daily. 07/24/14   Courtney Forcucci, PA-C  omeprazole (PRILOSEC) 20 MG capsule Take 20 mg by mouth daily.     Historical Provider, MD   ondansetron (ZOFRAN) 8 MG tablet Take 8 mg by mouth every 6 (six) hours as needed for vomiting.  01/31/14   Historical Provider, MD  oxyCODONE (ROXICODONE) 5 MG immediate release tablet Take 1 tablet (5 mg total) by mouth every 4 (four) hours as needed for severe pain. 02/06/14   Melene Planan Floyd, DO  pregabalin (LYRICA) 300 MG capsule Take 1 capsule (300 mg total) by mouth at bedtime. 02/12/14   Christiane Haorinna L Sullivan, MD  traZODone (DESYREL) 150 MG tablet Take 0.5 tablets (75 mg total) by mouth at bedtime as needed for sleep. 02/12/14   Christiane Haorinna L Sullivan, MD   BP 146/92 mmHg  Pulse 75  Temp(Src) 98.6 F (37 C) (Oral)  Resp 21  SpO2 95% Physical Exam  Constitutional: She appears well-developed and well-nourished. No distress.  HENT:  Head: Normocephalic.  Right Ear: External ear normal.  Left Ear: External ear normal.  Bruising noted around the right eye  Eyes: Conjunctivae are normal. Right eye exhibits no discharge. Left eye exhibits no discharge. No scleral icterus.  Neck: Neck supple. No tracheal deviation present.  Cardiovascular: Normal rate, regular rhythm and intact distal pulses.   Pulmonary/Chest: Effort normal and breath sounds normal. No stridor. No respiratory distress. She has no wheezes. She has no rales.  Abdominal: Soft. Bowel sounds are normal. She exhibits no distension. There is no tenderness. There is no rebound and no guarding.  Musculoskeletal: She exhibits no edema or tenderness.  Neurological: She is alert. She has normal strength. No cranial nerve deficit (no facial droop, extraocular movements intact, no slurred speech) or sensory deficit. She exhibits normal muscle tone. She displays no seizure activity. Coordination normal.  The patient is somewhat somnolent, her speech is slightly slurred she is alert and follows commands  Skin: Skin is warm and dry. No rash noted.  Psychiatric: She has a normal mood and affect.  Nursing note and vitals reviewed.   ED Course   Procedures (including critical care time) Labs Review Labs Reviewed  CBC - Abnormal; Notable for the following:    RBC 3.86 (*)    All other components within normal limits  COMPREHENSIVE METABOLIC PANEL - Abnormal; Notable for the following:    Chloride 112 (*)    BUN 5 (*)    Calcium 8.8 (*)    AST 14 (*)    ALT 9 (*)    Anion gap 4 (*)    All other components within normal limits  ACETAMINOPHEN LEVEL - Abnormal; Notable for the following:    Acetaminophen (Tylenol), Serum <10 (*)    All other components within normal limits  URINE RAPID DRUG SCREEN (HOSP PERFORMED) - Abnormal; Notable for the following:    Benzodiazepines POSITIVE (*)    All other components within normal limits  ETHANOL  SALICYLATE LEVEL  MAGNESIUM        EKG Interpretation   Date/Time:  Wednesday Nov 06 2014 20:39:08 EDT Ventricular Rate:  72 PR Interval:  135 QRS Duration: 86 QT Interval:  401 QTC Calculation: 439 R Axis:   71 Text Interpretation:  Sinus rhythm No significant change since last  tracing Confirmed by Dacari Beckstrand  MD-J, Heydi Swango ([id number]) on 11/06/2014 9:47:51 PM      MDM   Final diagnoses:  Overdose, undetermined intent, initial encounter    The patient presented to the emergency room after an overdose of Seroquel and Klonopin. She has remained hemodynamically stable. Laboratory tests are unremarkable. Patient was monitored for several hours. I will consult with the psychiatry service. Patient's denying any suicidal ideation but I am concerned that she may have had some intent to harm herself.    Linwood Dibbles, MD 11/06/14 [phone]

## 2014-11-07 ENCOUNTER — Emergency Department (HOSPITAL_COMMUNITY): Payer: Managed Care, Other (non HMO)

## 2014-11-07 ENCOUNTER — Inpatient Hospital Stay (HOSPITAL_COMMUNITY)
Admission: AD | Admit: 2014-11-07 | Payer: Managed Care, Other (non HMO) | Source: Intra-hospital | Admitting: Psychiatry

## 2014-11-07 DIAGNOSIS — F332 Major depressive disorder, recurrent severe without psychotic features: Secondary | ICD-10-CM | POA: Insufficient documentation

## 2014-11-07 DIAGNOSIS — T43592A Poisoning by other antipsychotics and neuroleptics, intentional self-harm, initial encounter: Secondary | ICD-10-CM | POA: Diagnosis not present

## 2014-11-07 DIAGNOSIS — T424X2A Poisoning by benzodiazepines, intentional self-harm, initial encounter: Secondary | ICD-10-CM

## 2014-11-07 LAB — PREGNANCY, URINE: PREG TEST UR: NEGATIVE

## 2014-11-07 MED ORDER — ZIPRASIDONE MESYLATE 20 MG IM SOLR
10.0000 mg | Freq: Once | INTRAMUSCULAR | Status: AC
  Administered 2014-11-07: 10 mg via INTRAMUSCULAR
  Filled 2014-11-07: qty 20

## 2014-11-07 MED ORDER — AMMONIA AROMATIC IN INHA
RESPIRATORY_TRACT | Status: AC
  Administered 2014-11-07: 16:00:00
  Filled 2014-11-07: qty 10

## 2014-11-07 MED ORDER — AMMONIA AROMATIC IN INHA
RESPIRATORY_TRACT | Status: AC
  Administered 2014-11-07: 15:00:00
  Filled 2014-11-07: qty 10

## 2014-11-07 MED ORDER — DIPHENHYDRAMINE HCL 50 MG/ML IJ SOLN
25.0000 mg | Freq: Once | INTRAMUSCULAR | Status: AC
  Administered 2014-11-07: 25 mg via INTRAMUSCULAR
  Filled 2014-11-07: qty 1

## 2014-11-07 NOTE — BH Assessment (Addendum)
Tele Assessment Note   Rachael Ramirez is an 44 y.o. female. Presenting to ED after taking more of her prescribed Klonopin and Seroquel. Pt reports she did this because she has been having trouble sleeping. She reports she has struggled with sleep problems for years, and has been having extreme difficulty the past few weeks. She reports she has only been getting about two hours of sleep a night. She reports last night she was frustrated that she could not sleep and took her klonopin and Seroquel, and when she did not fall asleep she took them again in less than thirty minutes, and then repeated this at least one more time that she can remember. She denies this was suicidal gesture, reporting she just wanted to sleep. She appears to have very limited insight into possible dangers of this behavior. This writer attempted to assess pt several times throughout the shift but she was extremely drowsy, slurred incoherent speech. At 6 am pt was alert and able to answer questions. She immediately stated she needed to leave and did not want to stay in the hospital. She was cooperative with assessment. She reports some mild depression with loss of appetite, and increased sleep problems, recently and being prescribed medication which she has not started. She is followed at the [facility] for SA, depression, and anxiety. Pt reports she has had two past suicide attempts several years ago due to intense chronic back pain. She denies current SI, HI, AVH, or self-harm. She reports no alcohol use in years, she reports hx of misusing her opioid pain medications, taking more than prescribed, but no use in several years. She reports she started taking heroin at age 53. She went to tx in January and reports one relapse since then lasting a day.  She reports hx of anxiety, with frequent worry but no panic attacks, no evidence of PTSD, or OCD. No abuse or trauma noted.   Family hx is positive for bipolar (sister) and etoh and SA abuse  concerns.    Donell Sievert, PA states pt meets inpt criteria and could benefit from stabilization and proper medication. Pt demanding to leave. It was requested of pt that she stay to be seen by psychiatry in several hours, and she demanded to leave AMA. IVC was initiated.      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 Back pain   . Asthma     uses Combivent daily as needed  . Insomnia     takes Trazodone nightly  . Anxiety     takes Klonopin daily as needed  . PONV (postoperative nausea and vomiting)   . Cough   . Spinal headache   . Weakness     left leg  . GERD (gastroesophageal reflux disease)     takes Omeprazole daily  . Urinary urgency   . Bipolar 1 disorder     takes Lamictal daily  . Chronic low back pain     HNP and Radiculopathy  . MRSA (methicillin resistant Staphylococcus aureus)     Postive nasal swab  . Hypertension   . Collapsed lung 3/15    "while in hospital awaiting back OR"    Past Surgical History  Procedure Laterality Date  . Percutaneous pinning Right 1992    "hand"  . Anterior cervical decomp/discectomy fusion  1996    "took  bone off her hip"  . Elbow fracture surgery Left 1975  . Elbow fracture surgery Left 2002    "redo"  . Lumbar disc surgery  2008 X 2  . Posterior lumbar fusion  2008  . Hardware removal  2008    "took screw out of lower back"  . Epidural block injection    . Lumbar laminectomy/decompression microdiscectomy Left 07/13/2013    Procedure: Left Lumbar four-five microdiskectomy;  Surgeon: Carmela Hurt, MD;  Location: [facility];  Service: Neurosurgery;  Laterality: Left;  Left Lumbar four-five microdiskectomy  . Lumbar wound debridement N/A 09/21/2013    Procedure: LUMBAR WOUND re-exploration and disectomy;  Surgeon: Carmela Hurt, MD;   Location: [facility];  Service: Neurosurgery;  Laterality: N/A;  . Tonsillectomy    . Fracture surgery    . Back surgery      Family History:  Family History  Problem Relation Age of Onset  . Multiple sclerosis Mother   . COPD Father   . Alcohol abuse Maternal Grandfather   . Alcohol abuse Brother   . Cirrhosis Brother     Social History:  reports that she has been smoking Cigarettes.  She has a 18 pack-year smoking history. She has never used smokeless tobacco. She reports that she does not drink alcohol or use illicit drugs.  Additional Social History:  Alcohol / Drug Use Pain Medications: See PTA, reports hx of misusing prescribed opiods, and then heroin Prescriptions: See PTA, reports was recently given a new prescription for anitdepressants but has not started it yet Over the Counter: See PTA History of alcohol / drug use?: Yes Longest period of sobriety (when/how long): 60 days with heroin, years for etoh  Negative Consequences of Use: Personal relationships Withdrawal Symptoms:  (none reported at this time) Substance #1 Name of Substance 1: heroin, reports this is her drug of choice 1 - Age of First Use: 39 1 - Amount (size/oz): "too much" via IV 1 - Frequency: was taking daily  1 - Duration: 4 years 1 - Last Use / Amount: reports went to treatment in [facility] in January and was clean for more than 60 days and then relapsed, one time use a couple of weeks ago Substance #2 Name of Substance 2: etoh 2 - Age of First Use: 16 2 - Amount (size/oz): unknown 2 - Frequency: unknown 2 - Duration: unknown 2 - Last Use / Amount: reports last use was years ago  Substance #3 Name of Substance 3: opioid pain medications "whatever they would give me" 3 - Age of First Use: about 8 years ago  3 - Amount (size/oz): reports could take up to twenty 10 mg, vicoden per day 3 - Frequency: daily  3 - Duration: about a year 3 - Last Use / Amount: several years ago  Substance #4 Name of  Substance 4: klonopin 4 - Age of First Use: unknown 4 - Amount (size/oz): reports sometimes takes double the prescribed amount "when I get frustrated" 4 - Frequency: unknown 4 - Duration: unknown 4 - Last Use / Amount: tested positive for her prescribed benzos and reports she took more than prescribed last night   CIWA: CIWA-Ar BP: 117/83 mmHg Pulse Rate: 72 COWS:    PATIENT STRENGTHS: (choose at least two) Communication skills  OP resources  Allergies:  Allergies  Allergen Reactions  . Minocycline Nausea And Vomiting    Home Medications:  (Not in a hospital admission)  OB/GYN Status:  No LMP recorded.  General Assessment Data Location of Assessment: [facility] TTS Assessment: In system Is this a Tele or Face-to-Face Assessment?: Face-to-Face Is this an Initial Assessment or a Re-assessment for this encounter?: Initial Assessment Marital status: Married Is patient pregnant?: No Pregnancy Status: No Living Arrangements: Spouse/significant other, Children Can pt return to current living arrangement?: Yes Admission Status: Voluntary Is patient capable of signing voluntary admission?: Yes Referral Source: Self/Family/Friend Insurance type: Medical sales [employer]     Crisis Care Plan Living Arrangements: Spouse/significant other, Children Name of Psychiatrist: [facility] Name of Therapist: [facility]  Education Status Is patient currently in school?: No Current Grade: NA Highest grade of school patient has completed: 12 Name of school: NA Contact person: NA  Risk to self with the past 6 months Suicidal Ideation: No Has patient been a risk to self within the past 6 months prior to admission? : Yes Suicidal Intent: No Has patient had any suicidal intent within the past 6 months prior to admission? : No Is patient at risk for suicide?: No Suicidal Plan?: No Has patient had any suicidal plan within the past 6 months prior to admission? : No Access to Means: Yes Specify Access  to Suicidal Means: medication, has overdosed and cut wrist in past suicide attempts What has been your use of drugs/alcohol within the last 12 months?: Pt reports she began misusing pain medications about 8 years ago due to pain condtion, and then progressed to using heroin. She has not used opiod meds in years, and went to treatment in January for heroin, she has one relapse several weeks ago. She reports she has not drank in years. She misuses her anxiety medicine taking more than prescribed at times Previous Attempts/Gestures: Yes How many times?: 2 Other Self Harm Risks: none Triggers for Past Attempts: Other (Comment) (intense chronic pain) Intentional Self Injurious Behavior: None Family Suicide History: No Recent stressful life event(s): Other (Comment) (trouble sleeping , back pain ) Persecutory voices/beliefs?: No Depression: Yes Depression Symptoms: Insomnia, Tearfulness, Feeling angry/irritable, Feeling worthless/self pity, Loss of interest in usual pleasures, Fatigue, Isolating, Despondent ("A little bit" regarding sx above) Substance abuse history and/or treatment for substance abuse?: Yes Suicide prevention information given to non-admitted patients: Yes  Risk to Others within the past 6 months Homicidal Ideation: No Does patient have any lifetime risk of violence toward others beyond the six months prior to admission? : No Thoughts of Harm to Others: No Current Homicidal Intent: No Current Homicidal Plan: No Access to Homicidal Means: No Identified Victim: none History of harm to others?: No Assessment of Violence: None Noted Violent Behavior Description: none Does patient have access to weapons?: No (reports guns in home, under lock she has no access) Criminal Charges Pending?: No Does patient have a court date: No Is patient on probation?: No  Psychosis Hallucinations: None noted Delusions: None noted  Mental Status Report Appearance/Hygiene: Unremarkable Eye  Contact: Good Motor Activity: Unremarkable Speech: Logical/coherent Level of Consciousness: Quiet/awake Mood: Anxious, Depressed Affect: Apprehensive Anxiety Level: Moderate Thought Processes: Coherent, Relevant Judgement: Impaired Orientation: Person, Place, Time, Situation Obsessive Compulsive Thoughts/Behaviors: None  Cognitive Functioning Concentration: Normal Memory: Recent Intact, Remote Intact IQ: Average Insight: Fair Impulse Control: Poor Appetite: Poor Weight Loss: 8 Weight Gain: 0 Sleep: Decreased Total Hours of Sleep: 2 Vegetative Symptoms: None  ADLScreening [facility] Assessment Services) Patient's cognitive ability adequate to safely complete daily activities?: Yes Patient able to express need for assistance with ADLs?: Yes Independently performs ADLs?: Yes (appropriate for developmental age)  Prior  Inpatient Therapy Prior Inpatient Therapy: Yes Prior Therapy Dates: 2013 and 2016 [facility] [place] Reason for Treatment: SA   Prior Outpatient Therapy Prior Outpatient Therapy: Yes Prior Therapy Dates: current Prior Therapy Facilty/Provider(s):  [facility] Reason for Treatment: SA Does patient have an ACCT team?: No Does patient have Intensive In-House Services?  : No Does patient have [facility] services? : No Does patient have P4CC services?: No  ADL Screening (condition at time of admission) Patient's cognitive ability adequate to safely complete daily activities?: Yes Is the patient deaf or have difficulty hearing?: No Does the patient have difficulty seeing, even when wearing glasses/contacts?: No Does the patient have difficulty concentrating, remembering, or making decisions?: No Patient able to express need for assistance with ADLs?: Yes Does the patient have difficulty dressing or bathing?: No Independently performs ADLs?: Yes (appropriate for developmental age) Does the patient have  difficulty walking or climbing stairs?: No Weakness of Legs: None Weakness of Arms/Hands: None  Home Assistive Devices/Equipment Home Assistive Devices/Equipment: None    Abuse/Neglect Assessment (Assessment to be complete while patient is alone) Physical Abuse: Denies Verbal Abuse: Denies Sexual Abuse: Denies Exploitation of patient/patient's resources: Denies Self-Neglect: Denies Values / Beliefs Cultural Requests During Hospitalization: None Spiritual Requests During Hospitalization: None   Advance Directives (For Healthcare) Does patient have an advance directive?: No Would patient like information on creating an advanced directive?: No - patient declined information    Additional Information 1:1 In Past 12 Months?: No CIRT Risk: No Elopement Risk: No Does patient have medical clearance?: Yes     Disposition:  Per Donell SievertSpencer Simon, PA pt meets inpt criteria, however pt is demanding to leave . Per Karleen HampshireSpencer she needs to stay for AM psych eval or be placed under IVC. Dr. Littie DeedsGentry notified. IVC will be initiated.  Pt reports she needs to leave to meet with lawyer and to Attend AA meeting at 11. She reports next [facility] appointment is in two weeks.    Clista BernhardtNancy Ignacia Gentzler, [facility] Triage Specialist 11/07/2014 6:30 AM  Disposition Initial Assessment Completed for this Encounter: Yes  Karanveer Ramakrishnan M 11/07/2014 6:20 AM

## 2014-11-07 NOTE — ED Notes (Signed)
Patient awake and requesting to use the phone.  Denied her request since she threw the phone this morning and it broke open.  Patient became beligerent and angry.  She started yelling obscenities.  Patient started slamming doors and cabinets. Security and police were called for show of force.  Patient then fell to floor and became uncooperative.  She had a strong pulse; vital WNL.  NP assessed patient and requested EDP to come and assess.  Amonia was placed in front of patient's nasal area and she swiped at it twice.  Decision was made to transfer patient out to main ED for further monitoring.  [facility] at this [facility] due to behavioral issues.

## 2014-11-07 NOTE — ED Notes (Signed)
Bed: [id number] Expected date:  Expected time:  Means of arrival:  Comments: RM 34

## 2014-11-07 NOTE — Progress Notes (Signed)
Patient is being reviewed for potential placement at [facility].   Maryelizabeth Rowanressa Marycarmen Hagey, MSW, LCSW, LCAS Clinical Social Worker [phone]

## 2014-11-07 NOTE — ED Notes (Signed)
Patient is sleeping at this time.

## 2014-11-07 NOTE — Progress Notes (Signed)
Pt referred to: [facility]-at capacity [facility]-under review [facility]  [facility]  [facility]  [facility]-left message   Olga CoasterKristen Legna [place], [place]  Clinical Social Work  [facility] [phone]

## 2014-11-07 NOTE — ED Notes (Signed)
Writer, Designer, television/film setN and sitter try to ambulate pt, pt was unsteady on her feet, pt was swerving from side to side and very unsteady on her feet.  Staff assist pt back into bed.

## 2014-11-07 NOTE — BH Assessment (Signed)
[facility] Assessment Progress Note  The following facilities have been contacted to seek placement for this pt with results as noted:  Beds available, information faxed, decision pending:  [facility]  At [facility]:  [facility]   Rooney Gladwin, [place] Triage Specialist [phone]

## 2014-11-07 NOTE — ED Notes (Signed)
Patient up to bathroom

## 2014-11-07 NOTE — ED Notes (Signed)
Patient was throwing coffee across room and threw bedside table.  She stated, "you have not seen the wrath of Fleet ContrasRachel yet!" Patient held down to be medicated.  Was given the choice of taking medication voluntarily which she refused.  She was threatening to staff and dared us "to put your fucking hands on me.  Ilsa IhaYa'll get a thrill out of holding down a 106 pound woman."  Patient was fighting and resisting.  Patient was given 10 mg geodon and 25 mg benedryl IM.  Injections were given and patient continued to Careers adviserfight security and police officer.  Patient calmed down and nurse allowed her to use the phone.  She called her sponsor and was cursing and threatening.  Nurse asked for phone and she refused.  Nurse had phone cut off after informing her that she needed to get off the phone.  Patient took phone and threw it down the hallway.  Nurse went to pick up phone and patient started down the hallway toward nurse in a threatening manner.  Patient was lunging at nurse and had to be held and taken back to her room.  Patient stood in doorway yelling obsenities and threats.  She told police officer, "I will shoot you."  She also stated several times that "I will be beat anybody's ass."  Patient was advised to lie down.  She sat on bed and asked for a coke.  Nurse stated she would bring her a coke as long as she did not throw it.
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
## 2014-11-07 NOTE — ED Provider Notes (Addendum)
  Physical Exam  BP 117/83 mmHg  Pulse 72  Temp(Src) 98.2 F (36.8 C) (Oral)  Resp 16  SpO2 98%  Physical Exam  ED Course  Procedures  MDM Called to psych ED for unresponsive patient. Patient was laying on the floor on her bed. Reportedly had been up and walking right before this. Patient does have pulses. Breathing spontaneously. Minimal response to pain. Eyes appear to be rolled back and had somewhat forcibly. Patient is not diabetic. Did have previous overdose around 18 hours ago of Klonopin and Seroquel. Has been belligerent and fighting with staff.  Discuss with psych nurse practitioner. She requests head CT because we do not know if she hit her head some time. Patient's mental status improved somewhat. Will put on monitor until head CT comes back. May be behavioral but will monitor.     Benjiman CoreNathan Honey Zakarian, MD 11/07/14 1450   records and imaging reviewed and patient did not hit her head. She had been punching the wall with her hand. Head CT will be canceled.  Benjiman CoreNathan Press Casale, MD 11/07/14 [phone]

## 2014-11-07 NOTE — BH Assessment (Signed)
Pt moved to TCU. Attempted to assess again. Pt asleep. Nurse reports pt is speaking more clearly now but very limited. Pt will arouse and then quickly fall back asleep. RN will call this Clinical research associatewriter if pt stirs. Pt to be assessed at 6 am vital check.   Clista BernhardtNancy Syrai Gladwin, [facility] Triage Specialist 11/07/2014 4:58 AM
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
## 2014-11-07 NOTE — ED Notes (Signed)
Patient threatening and angry.  Acuity high.
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
## 2014-11-07 NOTE — ED Notes (Signed)
Patient is high acuity due to previous aggression toward staff/security.

## 2014-11-08 ENCOUNTER — Emergency Department (HOSPITAL_COMMUNITY)
Admit: 2014-11-08 | Discharge: 2014-11-08 | Disposition: A | Discharge status: Home / Self Care | Payer: Managed Care, Other (non HMO) | Attending: Neurology | Admitting: Neurology

## 2014-11-08 DIAGNOSIS — F329 Major depressive disorder, single episode, unspecified: Secondary | ICD-10-CM | POA: Diagnosis not present

## 2014-11-08 DIAGNOSIS — F445 Conversion disorder with seizures or convulsions: Secondary | ICD-10-CM

## 2014-11-08 MED ORDER — ESCITALOPRAM OXALATE 10 MG PO TABS
10.0000 mg | ORAL_TABLET | Freq: Every day | ORAL | Status: DC
  Administered 2014-11-08 – 2014-11-10 (×3): 10 mg via ORAL
  Filled 2014-11-08 (×3): qty 1

## 2014-11-08 MED ORDER — LORAZEPAM 1 MG PO TABS
1.0000 mg | ORAL_TABLET | Freq: Three times a day (TID) | ORAL | Status: DC | PRN
  Administered 2014-11-08: 1 mg via ORAL
  Filled 2014-11-08: qty 1

## 2014-11-08 MED ORDER — ONDANSETRON HCL 4 MG PO TABS: 4.0000 mg | ORAL_TABLET | Freq: Three times a day (TID) | ORAL | Status: DC | PRN

## 2014-11-08 MED ORDER — ZOLPIDEM TARTRATE 5 MG PO TABS
5.0000 mg | ORAL_TABLET | Freq: Every evening | ORAL | Status: DC | PRN
  Administered 2014-11-08: 5 mg via ORAL
  Filled 2014-11-08: qty 1

## 2014-11-08 MED ORDER — QUETIAPINE FUMARATE 50 MG PO TABS
50.0000 mg | ORAL_TABLET | Freq: Every day | ORAL | Status: DC
  Administered 2014-11-08 – 2014-11-09 (×2): 50 mg via ORAL
  Filled 2014-11-08 (×2): qty 1

## 2014-11-08 MED ORDER — IBUPROFEN 800 MG PO TABS
800.0000 mg | ORAL_TABLET | Freq: Three times a day (TID) | ORAL | Status: DC
  Administered 2014-11-08 – 2014-11-10 (×5): 800 mg via ORAL
  Filled 2014-11-08 (×5): qty 1

## 2014-11-08 MED ORDER — ACETAMINOPHEN 325 MG PO TABS: 650.0000 mg | ORAL_TABLET | ORAL | Status: DC | PRN

## 2014-11-08 MED ORDER — ONDANSETRON HCL 4 MG PO TABS: 8.0000 mg | ORAL_TABLET | Freq: Four times a day (QID) | ORAL | Status: DC | PRN

## 2014-11-08 MED ORDER — CLONAZEPAM 0.5 MG PO TABS
1.0000 mg | ORAL_TABLET | Freq: Three times a day (TID) | ORAL | Status: DC
  Administered 2014-11-08 – 2014-11-10 (×5): 1 mg via ORAL
  Filled 2014-11-08 (×5): qty 2

## 2014-11-08 MED ORDER — PREGABALIN 50 MG PO CAPS
300.0000 mg | ORAL_CAPSULE | Freq: Every day | ORAL | Status: DC
  Administered 2014-11-08 – 2014-11-09 (×2): 300 mg via ORAL
  Filled 2014-11-08 (×2): qty 6

## 2014-11-08 NOTE — BH Assessment (Signed)
[facility] Assessment Progress Note Spoke with Dr. Vassie MoselleNantavati who said that he put consult in just to make sure TTS is aware that pt is cleared by neurology.  TTS will continue to pursue placement.

## 2014-11-08 NOTE — ED Provider Notes (Signed)
1000:  Called by Psych staff: pt apparently having "another episode." Pt's "episodes" include: "unresponsiveness," eyes closed or eyes forcefully held "rolled back," arms either held at sides rigidly or "shaking" arms and legs. Pt is able to follow commands and answer questions appropriately during these "episodes." When pt "awakens," she is A&O, NAD, speech clear. No AMS/lethargy/post-ictal state. Appears functional (pseudoseizure). Psych MD spoke with me: requesting medical admission, I explained these episodes were not organic and did not require medical admission; states pt then will at least need Neuro MD consult on chart. T/C to Neuro Dr. Amada JupiterKirkpatrick, case discussed, including:  HPI, pertinent PM/SHx, VS/PE, dx testing, ED course and treatment:  Agreeable to consult, he has already ordered EEG.  1215:  EEG was completed. During EEG, pt had one of her "episodes." No EEG evidence for seizure, per Neuro Dr. Amada JupiterKirkpatrick. Pt needs no further medical workup for these events.     [name], DO 11/08/14 1223

## 2014-11-08 NOTE — ED Notes (Signed)
Pt asked for assistance to bathroom.Pt became unresponsive we helped pt back to bed and we called the nurse she came.I did her vitals and there were 157/98 and heart rate was 70.
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
## 2014-11-08 NOTE — ED Notes (Signed)
[facility] Kendal HymenBonnie NT assisted. Pt became limp. Pt states she wants to go home. Pt encouraged to get back in bed. Pt states she is unable to get back in bed. Pt assisted back in bed (staff assist x 2 ) and informed she will will use bed pan for safety reasons.

## 2014-11-08 NOTE — ED Notes (Signed)
EEG at bedside.

## 2014-11-08 NOTE — ED Notes (Signed)
Upon arrival to room, pt unresponsive. ABC intact on cardiac monitor with non-twitching like seizure  activity. Pt did not respond to painful stimuli however when asked a question "do you want to speak with the Psych Doctor?" Pt did move her head up and down "yes". Made eye contact with this RN. Thedore MinsMojeed Akintayo MD and Cardell PeachJosephine Onuaha NP witnessed event. Event lasted less than 1 minute. At 10 AM pt not postictal. Recovered without event. Precautions placed. At 10:01 [facility] notified by phone of event. Basilio Caironuaha MD to speak with EDP Clarene DukeMcManus about pt current status. At 10:12 EDP Pickering and EDP Clarene DukeMcManus to see and evaluate this pt. Each given details of event. Pt continues to be monitored.

## 2014-11-08 NOTE — ED Notes (Signed)
Pt asked for assistance to bathroom. Pt became unresponsive we helped pt back to bed. Nurse was called to bedside.Pt pulse and resperation were present. Pt was placed on cardiac monitor .MD and NP psych were present. I witness the Pt respond by nodding her head when asked questions.

## 2014-11-08 NOTE — ED Notes (Signed)
MD at bedside. PICKERING EDP. UPDATED ON PT CURRENT STATUS.

## 2014-11-08 NOTE — Progress Notes (Signed)
EEG completed; results pending.    

## 2014-11-08 NOTE — ED Notes (Signed)
Rachael Ramirez is awake attempting to climb OOB requesting female staff member to talk too. Television turned on for her by her request and will contact charge nurse to request assistance of female staff.

## 2014-11-08 NOTE — Procedures (Signed)
History: 44 yo F with spells  Sedation: None  Technique: This is a 19 channel routine scalp EEG performed at the bedside with bipolar and monopolar montages arranged in accordance to the international 10/20 system of electrode placement. One channel was dedicated to EKG recording.    Background: The background consists of intermixed alpha and beta activities. There is a well defined posterior dominant rhythm of 12 Hz that attenuates with eye opening. There is no EEG change associated with the patient's spell and the PDR was readily visible the entire time.   Photic stimulation: Physiologic driving is not performed  EEG Abnormalities: None  Clinical Interpretation: This normal EEG is recorded in the waking state. There was no seizure or seizure predisposition recorded on this study.   There was no EEG change associated with the patient's spell of decreased responsiveness, bilateral arm flexion, tight eye closure. 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   Ritta SlotMcNeill Aaryn Parrilla, MD [facility] [phone]  If 7pm- 7am, please page neurology on call as listed in AMION.

## 2014-11-08 NOTE — ED Notes (Signed)
Patient resting in bed with no s/s of distress noted. Pt with dull, flat affect endorsing depression. Pt denies SI at this time. Sitter at bedside for safety.

## 2014-11-08 NOTE — Consult Note (Addendum)
Neurology Consultation Reason for Consult: Abnormal spells  Referring Physician: Merlene LaughterMcManus, K  CC: Spells  History is obtained from:patient  HPI: Rachael FreezeRachel Ramirez is a 44 y.o. female with a history of spells for about two years which she says happen with headaches or stress. She has not sought evaluation of them. Seh came in with takign too much medicine and was evaluated by psychiatry. After being told she was not allowed to leave, she began having abnormal spells with varying semiology. She had preserved conciousness during the events.   After my arrival, the patient had an event with forceful eye closure and bilateral arm flexion. She was able to nod "yes" when I asked if this was her typical event. This passed and she regained normal conversational ability.   She states that she does have an issue which she needs to discuss that she has not opened up about.   Multiple episodes were witnessed by two separate ED physicians and felt to be psychogenic.    ROS: A 14 point ROS was performed and is negative except as noted in the HPI.   Past Medical History  Diagnosis Date  . Back pain   . Asthma     uses Combivent daily as needed  . Insomnia     takes Trazodone nightly  . Anxiety     takes Klonopin daily as needed  . PONV (postoperative nausea and vomiting)   . Cough   . Spinal headache   . Weakness     left leg  . GERD (gastroesophageal reflux disease)     takes Omeprazole daily  . Urinary urgency   . Bipolar 1 disorder     takes Lamictal daily  . Chronic low back pain     HNP and Radiculopathy  . MRSA (methicillin resistant Staphylococcus aureus)     Postive nasal swab  . Hypertension   . Collapsed lung 3/15    "while in hospital awaiting back OR"    Family History: No hx sz  Social History: Tob: current smoker  Exam: Current vital signs: BP 113/84 mmHg  Pulse 62  Temp(Src) 98.7 F (37.1 C) (Oral)  Resp 18  Ht 5\' 2"  (1.575 m)  Wt 48.535 kg (107 lb)  BMI 19.57  kg/m2  SpO2 97% Vital signs in last 24 hours: Temp:  [98.2 F (36.8 C)-98.7 F (37.1 C)] 98.7 F (37.1 C) (05/13 1224) Pulse Rate:  [62-86] 62 (05/13 1224) Resp:  [18] 18 (05/13 1224) BP: (113-171)/(84-123) 113/84 mmHg (05/13 1224) SpO2:  [93 %-99 %] 97 % (05/13 1224) Weight:  [48.535 kg (107 lb)] 48.535 kg (107 lb) (05/13 0903)  Physical Exam  Constitutional: Appears well-developed and well-nourished.  Psych: Affect appropriate to situation Eyes: No scleral injection HENT: No OP obstrucion Head: Normocephalic.  Cardiovascular: Normal rate and regular rhythm.  Respiratory: Effort normal and breath sounds normal to anterior ascultation GI: Soft.  No distension. There is no tenderness.  Skin: WDI  Neuro: Mental Status: Patient is awake, alert, oriented to person, place, month, year, and situation. Patient is able to give a clear and coherent history. No signs of aphasia or neglect Cranial Nerves: II: Visual Fields are full. Pupils are equal, round, and reactive to light.   III,IV, VI: EOMI without ptosis or diploplia.  V: Facial sensation is symmetric to temperature VII: Facial movement is symmetric.  VIII: hearing is intact to voice X: Uvula elevates symmetrically XI: Shoulder shrug is symmetric. XII: tongue is midline without atrophy or fasciculations.  Motor: Tone is normal. Bulk is normal. 5/5 strength was present in all four extremities.  Sensory: Sensation is symmetric to light touch and temperature in the arms and legs. Cerebellar: FNF intact bilaterally         I have reviewed labs in epic and the results pertinent to this consultation are: cmp - unremarkable  I have reviewed the images obtained:CT head - unremarkable  Impression: 44 yo F with abnormal episodes, likely psychogenic.   Recommendations: 1) EEG   Ritta SlotMcNeill Chisom Muntean, MD [facility] [phone]  If 7pm- 7am, please page neurology on call as listed in AMION.   EEG  captured a typical spell, non-epileptic in origin. Further management per psychiatry.   I discussed the nature of psychogenic non-epileptic spells with the patient.   Ritta SlotMcNeill Zackry Deines, MD [facility] [phone]  If 7pm- 7am, please page neurology on call as listed in AMION.

## 2014-11-08 NOTE — ED Notes (Signed)
MD at bedside. Kirkpatrick MD [facility] here to evaluate this pt. EEG performed. PT HAD EVENT DURING EEG. NEURO TO CONFIRM NOT SEIZURE.

## 2014-11-08 NOTE — Progress Notes (Signed)
Pt on [facility] wait[facility] per Robinette.   Olga CoasterKristen Domonick Sittner, LCSW  Clinical Social Work  [facility] [phone]

## 2014-11-09 DIAGNOSIS — T43592A Poisoning by other antipsychotics and neuroleptics, intentional self-harm, initial encounter: Secondary | ICD-10-CM | POA: Diagnosis not present

## 2014-11-09 DIAGNOSIS — T424X2A Poisoning by benzodiazepines, intentional self-harm, initial encounter: Secondary | ICD-10-CM | POA: Diagnosis not present

## 2014-11-09 DIAGNOSIS — F332 Major depressive disorder, recurrent severe without psychotic features: Secondary | ICD-10-CM | POA: Diagnosis not present

## 2014-11-09 NOTE — Progress Notes (Signed)
Per [facility] admissions staff, Pt remains on [facility] wait[facility]. No bed availability today.  Rachael Ramirez, LCSWA 11/09/2014 10:36 AM [phone]   
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
## 2014-11-09 NOTE — ED Notes (Signed)
Pt alert x4, resting will continue to monitor, no c/o pain or distress. Estill DoomsSimon, Ercilia Bettinger Mahario, RN 8:43 PM 11/09/2014

## 2014-11-10 DIAGNOSIS — F332 Major depressive disorder, recurrent severe without psychotic features: Secondary | ICD-10-CM
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
## 2014-11-10 NOTE — B[facility] Counselor (Signed)
Spoke with husband about discharge today. He agreed that she is safe to go home. No questions or complaints noted.   Kateri PlummerKristin Shir Bergman, M.S., [place]CA,LCASA, [facility] Licensed Professional Counselor Associate  Triage Specialist  [facility]  Therapeutic Triage Services Phone: [phone] Fax: [phone]

## 2014-11-10 NOTE — Progress Notes (Signed)
11/10/14   1510  Patient discharge and husband picked up patient. IV removed. Reviewed discharge instructions with patient. Copy of discharge papers given to patient.

## 2014-11-18 ENCOUNTER — Encounter (HOSPITAL_COMMUNITY): Payer: Self-pay | Admitting: *Deleted

## 2014-11-18 ENCOUNTER — Emergency Department (HOSPITAL_COMMUNITY)
Admission: EM | Admit: 2014-11-18 | Discharge: 2014-11-19 | Discharge status: Against Medical Advice | Payer: Managed Care, Other (non HMO) | Attending: Emergency Medicine | Admitting: Emergency Medicine

## 2014-11-18 DIAGNOSIS — Z3202 Encounter for pregnancy test, result negative: Secondary | ICD-10-CM | POA: Insufficient documentation

## 2014-11-18 DIAGNOSIS — J45909 Unspecified asthma, uncomplicated: Secondary | ICD-10-CM | POA: Diagnosis not present

## 2014-11-18 DIAGNOSIS — S199XXA Unspecified injury of neck, initial encounter: Secondary | ICD-10-CM | POA: Diagnosis not present

## 2014-11-18 DIAGNOSIS — Z791 Long term (current) use of non-steroidal anti-inflammatories (NSAID): Secondary | ICD-10-CM | POA: Diagnosis not present

## 2014-11-18 DIAGNOSIS — G4700 Insomnia, unspecified: Secondary | ICD-10-CM | POA: Diagnosis not present

## 2014-11-18 DIAGNOSIS — Z8614 Personal history of Methicillin resistant Staphylococcus aureus infection: Secondary | ICD-10-CM | POA: Diagnosis not present

## 2014-11-18 DIAGNOSIS — F319 Bipolar disorder, unspecified: Secondary | ICD-10-CM | POA: Insufficient documentation

## 2014-11-18 DIAGNOSIS — Y9289 Other specified places as the place of occurrence of the external cause: Secondary | ICD-10-CM | POA: Insufficient documentation

## 2014-11-18 DIAGNOSIS — Y9389 Activity, other specified: Secondary | ICD-10-CM | POA: Insufficient documentation

## 2014-11-18 DIAGNOSIS — I10 Essential (primary) hypertension: Secondary | ICD-10-CM | POA: Insufficient documentation

## 2014-11-18 DIAGNOSIS — S3992XA Unspecified injury of lower back, initial encounter: Secondary | ICD-10-CM | POA: Insufficient documentation

## 2014-11-18 DIAGNOSIS — R569 Unspecified convulsions: Secondary | ICD-10-CM | POA: Insufficient documentation

## 2014-11-18 DIAGNOSIS — S29092A Other injury of muscle and tendon of back wall of thorax, initial encounter: Secondary | ICD-10-CM | POA: Insufficient documentation

## 2014-11-18 DIAGNOSIS — Z720 Tobacco use: Secondary | ICD-10-CM | POA: Insufficient documentation

## 2014-11-18 DIAGNOSIS — G8929 Other chronic pain: Secondary | ICD-10-CM | POA: Diagnosis not present

## 2014-11-18 DIAGNOSIS — F419 Anxiety disorder, unspecified: Secondary | ICD-10-CM | POA: Insufficient documentation

## 2014-11-18 DIAGNOSIS — Y998 Other external cause status: Secondary | ICD-10-CM | POA: Insufficient documentation

## 2014-11-18 DIAGNOSIS — R55 Syncope and collapse: Secondary | ICD-10-CM | POA: Insufficient documentation

## 2014-11-18 DIAGNOSIS — Z79899 Other long term (current) drug therapy: Secondary | ICD-10-CM | POA: Diagnosis not present

## 2014-11-18 DIAGNOSIS — S0083XA Contusion of other part of head, initial encounter: Secondary | ICD-10-CM | POA: Insufficient documentation

## 2014-11-18 DIAGNOSIS — W01198A Fall on same level from slipping, tripping and stumbling with subsequent striking against other object, initial encounter: Secondary | ICD-10-CM | POA: Diagnosis not present

## 2014-11-18 DIAGNOSIS — G529 Cranial nerve disorder, unspecified: Secondary | ICD-10-CM | POA: Insufficient documentation

## 2014-11-18 DIAGNOSIS — K219 Gastro-esophageal reflux disease without esophagitis: Secondary | ICD-10-CM | POA: Diagnosis not present

## 2014-11-18 DIAGNOSIS — W108XXA Fall (on) (from) other stairs and steps, initial encounter: Secondary | ICD-10-CM | POA: Diagnosis not present

## 2014-11-18 DIAGNOSIS — S00512A Abrasion of oral cavity, initial encounter: Secondary | ICD-10-CM | POA: Diagnosis not present

## 2014-11-18 NOTE — ED Notes (Signed)
Pt reports that she fell down 10 stairs this morning around 11; pt states that she struck her head and mouth and left knee during fall; pt states that she walked after fall and felt ok; pt states that as time has passed she felt more dizzy and went to her friends house; upon arrival to the ER friend states that pt was limp and not responding for a few seconds; pt awake and responsive in triage

## 2014-11-19 ENCOUNTER — Emergency Department (HOSPITAL_COMMUNITY)
Admission: EM | Admit: 2014-11-19 | Discharge: 2014-11-19 | Disposition: A | Discharge status: Home / Self Care | Payer: Managed Care, Other (non HMO) | Attending: Emergency Medicine | Admitting: Emergency Medicine

## 2014-11-19 ENCOUNTER — Emergency Department (HOSPITAL_COMMUNITY): Payer: Managed Care, Other (non HMO)

## 2014-11-19 ENCOUNTER — Encounter (HOSPITAL_COMMUNITY): Payer: Self-pay | Admitting: *Deleted

## 2014-11-19 DIAGNOSIS — G8929 Other chronic pain: Secondary | ICD-10-CM | POA: Diagnosis not present

## 2014-11-19 DIAGNOSIS — W108XXA Fall (on) (from) other stairs and steps, initial encounter: Secondary | ICD-10-CM | POA: Insufficient documentation

## 2014-11-19 DIAGNOSIS — J45909 Unspecified asthma, uncomplicated: Secondary | ICD-10-CM | POA: Insufficient documentation

## 2014-11-19 DIAGNOSIS — Z791 Long term (current) use of non-steroidal anti-inflammatories (NSAID): Secondary | ICD-10-CM | POA: Diagnosis not present

## 2014-11-19 DIAGNOSIS — Z720 Tobacco use: Secondary | ICD-10-CM | POA: Insufficient documentation

## 2014-11-19 DIAGNOSIS — Z8614 Personal history of Methicillin resistant Staphylococcus aureus infection: Secondary | ICD-10-CM | POA: Insufficient documentation

## 2014-11-19 DIAGNOSIS — Y9389 Activity, other specified: Secondary | ICD-10-CM | POA: Diagnosis not present

## 2014-11-19 DIAGNOSIS — Y9289 Other specified places as the place of occurrence of the external cause: Secondary | ICD-10-CM | POA: Diagnosis not present

## 2014-11-19 DIAGNOSIS — G4700 Insomnia, unspecified: Secondary | ICD-10-CM | POA: Insufficient documentation

## 2014-11-19 DIAGNOSIS — S0083XA Contusion of other part of head, initial encounter: Secondary | ICD-10-CM | POA: Insufficient documentation

## 2014-11-19 DIAGNOSIS — F445 Conversion disorder with seizures or convulsions: Secondary | ICD-10-CM

## 2014-11-19 DIAGNOSIS — S0993XA Unspecified injury of face, initial encounter: Secondary | ICD-10-CM | POA: Diagnosis present

## 2014-11-19 DIAGNOSIS — I10 Essential (primary) hypertension: Secondary | ICD-10-CM | POA: Diagnosis not present

## 2014-11-19 DIAGNOSIS — F419 Anxiety disorder, unspecified: Secondary | ICD-10-CM | POA: Insufficient documentation

## 2014-11-19 DIAGNOSIS — Z79899 Other long term (current) drug therapy: Secondary | ICD-10-CM | POA: Diagnosis not present

## 2014-11-19 DIAGNOSIS — K219 Gastro-esophageal reflux disease without esophagitis: Secondary | ICD-10-CM | POA: Diagnosis not present

## 2014-11-19 DIAGNOSIS — F319 Bipolar disorder, unspecified: Secondary | ICD-10-CM | POA: Diagnosis not present

## 2014-11-19 DIAGNOSIS — G40909 Epilepsy, unspecified, not intractable, without status epilepticus: Secondary | ICD-10-CM | POA: Diagnosis not present

## 2014-11-19 DIAGNOSIS — Y998 Other external cause status: Secondary | ICD-10-CM | POA: Insufficient documentation

## 2014-11-19 LAB — RAPID URINE DRUG SCREEN, HOSP PERFORMED
AMPHETAMINES: NOT DETECTED
Barbiturates: NOT DETECTED
Benzodiazepines: POSITIVE — AB
Cocaine: NOT DETECTED
OPIATES: NOT DETECTED
TETRAHYDROCANNABINOL: NOT DETECTED

## 2014-11-19 LAB — CBC WITH DIFFERENTIAL/PLATELET
BASOS PCT: 0 % (ref 0–1)
Basophils Absolute: 0 10*3/uL (ref 0.0–0.1)
EOS ABS: 0.1 10*3/uL (ref 0.0–0.7)
Eosinophils Relative: 0 % (ref 0–5)
HCT: 43.2 % (ref 36.0–46.0)
HEMOGLOBIN: 14.3 g/dL (ref 12.0–15.0)
Lymphocytes Relative: 15 % (ref 12–46)
Lymphs Abs: 2 10*3/uL (ref 0.7–4.0)
MCH: 31.9 pg (ref 26.0–34.0)
MCHC: 33.1 g/dL (ref 30.0–36.0)
MCV: 96.4 fL (ref 78.0–100.0)
Monocytes Absolute: 0.7 10*3/uL (ref 0.1–1.0)
Monocytes Relative: 5 % (ref 3–12)
NEUTROS PCT: 80 % — AB (ref 43–77)
Neutro Abs: 10.8 10*3/uL — ABNORMAL HIGH (ref 1.7–7.7)
Platelets: 418 10*3/uL — ABNORMAL HIGH (ref 150–400)
RBC: 4.48 MIL/uL (ref 3.87–5.11)
RDW: 13.5 % (ref 11.5–15.5)
WBC: 13.6 10*3/uL — AB (ref 4.0–10.5)

## 2014-11-19 LAB — COMPREHENSIVE METABOLIC PANEL
ALBUMIN: 4.2 g/dL (ref 3.5–5.0)
ALT: 10 U/L — AB (ref 14–54)
ANION GAP: 11 (ref 5–15)
AST: 17 U/L (ref 15–41)
Alkaline Phosphatase: 89 U/L (ref 38–126)
BUN: 6 mg/dL (ref 6–20)
CALCIUM: 9.4 mg/dL (ref 8.9–10.3)
CHLORIDE: 105 mmol/L (ref 101–111)
CO2: 24 mmol/L (ref 22–32)
Creatinine, Ser: 0.56 mg/dL (ref 0.44–1.00)
GFR calc Af Amer: >60 mL/min (ref >60)
GFR calc non Af Amer: >60 mL/min (ref >60)
Glucose, Bld: 105 mg/dL — ABNORMAL HIGH (ref 65–99)
POTASSIUM: 4 mmol/L (ref 3.5–5.1)
Sodium: 140 mmol/L (ref 135–145)
Total Bilirubin: 0.5 mg/dL (ref 0.3–1.2)
Total Protein: 8.2 g/dL — ABNORMAL HIGH (ref 6.5–8.1)

## 2014-11-19 LAB — I-STAT BETA HCG BLOOD, ED (MC, WL, AP ONLY)

## 2014-11-19 LAB — SALICYLATE LEVEL: Salicylate Lvl: <4 mg/dL (ref 2.8–30.0)

## 2014-11-19 LAB — ETHANOL: Alcohol, Ethyl (B): <5 mg/dL (ref <5)

## 2014-11-19 LAB — ACETAMINOPHEN LEVEL

## 2014-11-19 LAB — CBG MONITORING, ED: GLUCOSE-CAPILLARY: 93 mg/dL (ref 65–99)

## 2014-11-19 MED ORDER — LORAZEPAM 2 MG/ML IJ SOLN
1.0000 mg | Freq: Once | INTRAMUSCULAR | Status: AC
  Administered 2014-11-19: 1 mg via INTRAMUSCULAR
  Filled 2014-11-19: qty 1

## 2014-11-19 MED ORDER — CLONAZEPAM 0.5 MG PO TABS: 1.0000 mg | ORAL_TABLET | Freq: Three times a day (TID) | ORAL | Status: DC

## 2014-11-19 MED ORDER — LORAZEPAM 2 MG/ML IJ SOLN: 0.5000 mg | Freq: Once | INTRAMUSCULAR | Status: DC

## 2014-11-19 MED ORDER — ONDANSETRON HCL 4 MG PO TABS: 8.0000 mg | ORAL_TABLET | Freq: Four times a day (QID) | ORAL | Status: DC | PRN

## 2014-11-19 MED ORDER — PREGABALIN 50 MG PO CAPS: 300.0000 mg | ORAL_CAPSULE | Freq: Every day | ORAL | Status: DC

## 2014-11-19 MED ORDER — PANTOPRAZOLE SODIUM 40 MG PO TBEC: 40.0000 mg | DELAYED_RELEASE_TABLET | Freq: Every day | ORAL | Status: DC

## 2014-11-19 MED ORDER — QUETIAPINE FUMARATE 50 MG PO TABS: 50.0000 mg | ORAL_TABLET | Freq: Every day | ORAL | Status: DC

## 2014-11-19 MED ORDER — ESCITALOPRAM OXALATE 10 MG PO TABS: 10.0000 mg | ORAL_TABLET | Freq: Every day | ORAL | Status: DC

## 2014-11-19 MED ORDER — TRAZODONE HCL 50 MG PO TABS: 75.0000 mg | ORAL_TABLET | Freq: Every evening | ORAL | Status: DC | PRN

## 2014-11-19 NOTE — ED Notes (Signed)
Pt refusing to stay here and wait for results  Pt refused to have vital signs taken  Pt informed of risks of leaving and continues to want to leave

## 2014-11-19 NOTE — ED Notes (Signed)
Returned from CT.

## 2014-11-19 NOTE — ED Notes (Signed)
Patient transported to CT 

## 2014-11-19 NOTE — ED Notes (Signed)
Pt refused lumbar spine xrays

## 2014-11-19 NOTE — ED Notes (Signed)
Per family member pt has had multiple seizures in last 12 hours, pt was seen here for same last night and left AMA, pt has bruising and swelling to left side of the face, family member sts it was due to fall.

## 2014-11-19 NOTE — ED Notes (Signed)
Upon arrival to room pt was having jerking movements possible seizure like activity  Notified MD  Sophia, PA in room to evaluate pt  Pt began talking and wanting to go home  Trying to get OOB   Pt got OOB with assist and collapsed at end of bed  Assisted to floor by Lavonda JumboK Sophia, PA and myself  Assisted back to bed

## 2014-11-19 NOTE — ED Provider Notes (Signed)
CSN: [id number]     Arrival date & time 11/18/14  2231 History   First MD Initiated Contact with Patient 11/19/14 0000     Chief Complaint  Patient presents with  . Fall  . Loss of Consciousness     (Consider location/radiation/quality/duration/timing/severity/associated sxs/prior Treatment) Patient is a 44 y.o. female presenting with fall and syncope. The history is provided by the patient. No language interpreter was used.  Fall This is a new problem. The current episode started today. Nothing aggravates the symptoms. She has tried nothing for the symptoms.  Loss of Consciousness Pt is reported to have fallen down today.   Pt has bruises on her face.  Rn reported pt having a seizure. Pt jerking arms and talking in a whisper.   Past Medical History  Diagnosis Date  . Back pain   . Asthma     uses Combivent daily as needed  . Insomnia     takes Trazodone nightly  . Anxiety     takes Klonopin daily as needed  . PONV (postoperative nausea and vomiting)   . Cough   . Spinal headache   . Weakness     left leg  . GERD (gastroesophageal reflux disease)     takes Omeprazole daily  . Urinary urgency   . Bipolar 1 disorder     takes Lamictal daily  . Chronic low back pain     HNP and Radiculopathy  . MRSA (methicillin resistant Staphylococcus aureus)     Postive nasal swab  . Hypertension   . Collapsed lung 3/15    "while in hospital awaiting back OR"   Past Surgical History  Procedure Laterality Date  . Percutaneous pinning Right 1992    "hand"  . Anterior cervical decomp/discectomy fusion  1996    "took bone off her hip"  . Elbow fracture surgery Left 1975  . Elbow fracture surgery Left 2002    "redo"  . Lumbar disc surgery  2008 X 2  . Posterior lumbar fusion  2008  . Hardware removal  2008    "took screw out of lower back"  . Epidural block injection    . Lumbar laminectomy/decompression microdiscectomy Left 07/13/2013    Procedure: Left Lumbar four-five  microdiskectomy;  Surgeon: Carmela HurtKyle L Cabbell, MD;  Location: [facility];  Service: Neurosurgery;  Laterality: Left;  Left Lumbar four-five microdiskectomy  . Lumbar wound debridement N/A 09/21/2013    Procedure: LUMBAR WOUND re-exploration and disectomy;  Surgeon: Carmela HurtKyle L Cabbell, MD;  Location: [facility];  Service: Neurosurgery;  Laterality: N/A;  . Tonsillectomy    . Fracture surgery    . Back surgery     Family History  Problem Relation Age of Onset  . Multiple sclerosis Mother   . COPD Father   . Alcohol abuse Maternal Grandfather   . Alcohol abuse Brother   . Cirrhosis Brother    History  Substance Use Topics  . Smoking status: Current Every Day Smoker -- 1.00 packs/day for 18 years    Types: Cigarettes  . Smokeless tobacco: Never Used  . Alcohol Use: No   OB History    No data available     Review of Systems  Cardiovascular: Positive for syncope.  All other systems reviewed and are negative.     Allergies  Minocycline  Home Medications   Prior to Admission medications   Medication Sig Start Date End Date Taking? Authorizing Provider  clonazePAM (KLONOPIN) 1 MG tablet Take 1 mg  by mouth 3 (three) times daily.   Yes Historical Provider, MD  escitalopram (LEXAPRO) 10 MG tablet Take 1 tablet by mouth daily. 11/06/14  Yes Historical Provider, MD  omeprazole (PRILOSEC) 20 MG capsule Take 20 mg by mouth daily.    Yes Historical Provider, MD  pregabalin (LYRICA) 300 MG capsule Take 1 capsule (300 mg total) by mouth at bedtime. 02/12/14  Yes Christiane Ha, MD  QUEtiapine (SEROQUEL) 50 MG tablet Take 1 tablet by mouth at bedtime. 10/09/14  Yes Historical Provider, MD  traZODone (DESYREL) 150 MG tablet Take 0.5 tablets (75 mg total) by mouth at bedtime as needed for sleep. Patient taking differently: Take 300 mg by mouth at bedtime as needed for sleep.  02/12/14  Yes Christiane Ha, MD  ibuprofen (ADVIL,MOTRIN) 800 MG tablet Take 1 tablet (800 mg total) by mouth 3  (three) times daily. Patient not taking: Reported on 11/18/2014 07/24/14   Toni Amend Forcucci, PA-C  ondansetron (ZOFRAN) 8 MG tablet Take 8 mg by mouth every 6 (six) hours as needed for vomiting.  01/31/14   Historical Provider, MD  SUBOXONE 2-0.5 MG FILM 1 Film See admin instructions. May use up to 3 films per day 11/01/14   Historical Provider, MD   BP 158/105 mmHg  Pulse 90  Temp(Src) 97.8 F (36.6 C) (Oral)  Resp 20  SpO2 95%  LMP  (LMP Unknown) Physical Exam  Constitutional: She appears well-developed and well-nourished.  HENT:  Head: Normocephalic.  Right Ear: External ear normal.  Left Ear: External ear normal.  Bruising left face,  Abrasion left side of mouth.  Eyes: Conjunctivae and EOM are normal. Pupils are equal, round, and reactive to light.  Neck: Normal range of motion.  Cardiovascular: Normal rate and normal heart sounds.   Pulmonary/Chest: Effort normal.  Abdominal: Soft.  Musculoskeletal:  Tender ls spine  diffusley   Neurological: She is alert. A cranial nerve deficit is present.  Skin: Skin is warm.  Nursing note and vitals reviewed.   ED Course  Procedures (including critical care time) Labs Review Labs Reviewed - No data to display  Imaging Review No results found.   EKG Interpretation None      MDM  Pt attempted to stand.  Pt sat down in floor.  Rn and I assisted back into bed.  xrays and labs ordered,  Records reviewed.   Pt has been cleared of seizure disorder.  (activity observed seems very purposeful )    Final diagnoses:  None        Elson Areas, PA-C 11/19/14 0454  Devoria Albe, MD 11/19/14 0030

## 2014-11-19 NOTE — ED Notes (Signed)
Pt asking to leave  Encouraging pt to stay
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
## 2014-11-19 NOTE — ED Provider Notes (Signed)
CSN: [id number]     Arrival date & time 11/19/14  1235 History   First MD Initiated Contact with Patient 11/19/14 1243     Chief Complaint  Patient presents with  . Seizures    Level V caveat due to psychiatric disorder. (Consider location/radiation/quality/duration/timing/severity/associated sxs/prior Treatment) Patient is a 44 y.o. female presenting with seizures. The history is provided by the patient.  Seizures  patient with reported seizure activity. Was seen in the ER last night after falling down some stairs and reportedly her face on something. Had negative CT at that time. Reportedly had seizure activity again. Upon my arrival to the room she was shaking both her arms. She will look somewhat. Recent admission for psychiatric issues and had actual seizures ruled out at that time. She had an episode while on EEG. Patient's mother states that she has had four of these severe seizures today.   Past Medical History  Diagnosis Date  . Back pain   . Asthma     uses Combivent daily as needed  . Insomnia     takes Trazodone nightly  . Anxiety     takes Klonopin daily as needed  . PONV (postoperative nausea and vomiting)   . Cough   . Spinal headache   . Weakness     left leg  . GERD (gastroesophageal reflux disease)     takes Omeprazole daily  . Urinary urgency   . Bipolar 1 disorder     takes Lamictal daily  . Chronic low back pain     HNP and Radiculopathy  . MRSA (methicillin resistant Staphylococcus aureus)     Postive nasal swab  . Hypertension   . Collapsed lung 3/15    "while in hospital awaiting back OR"   Past Surgical History  Procedure Laterality Date  . Percutaneous pinning Right 1992    "hand"  . Anterior cervical decomp/discectomy fusion  1996    "took bone off her hip"  . Elbow fracture surgery Left 1975  . Elbow fracture surgery Left 2002    "redo"  . Lumbar disc surgery  2008 X 2  . Posterior lumbar fusion  2008  . Hardware removal  2008    "took  screw out of lower back"  . Epidural block injection    . Lumbar laminectomy/decompression microdiscectomy Left 07/13/2013    Procedure: Left Lumbar four-five microdiskectomy;  Surgeon: Carmela Hurt, MD;  Location: [facility];  Service: Neurosurgery;  Laterality: Left;  Left Lumbar four-five microdiskectomy  . Lumbar wound debridement N/A 09/21/2013    Procedure: LUMBAR WOUND re-exploration and disectomy;  Surgeon: Carmela Hurt, MD;  Location: [facility];  Service: Neurosurgery;  Laterality: N/A;  . Tonsillectomy    . Fracture surgery    . Back surgery     Family History  Problem Relation Age of Onset  . Multiple sclerosis Mother   . COPD Father   . Alcohol abuse Maternal Grandfather   . Alcohol abuse Brother   . Cirrhosis Brother    History  Substance Use Topics  . Smoking status: Current Every Day Smoker -- 1.00 packs/day for 18 years    Types: Cigarettes  . Smokeless tobacco: Never Used  . Alcohol Use: No   OB History    No data available     Review of Systems  Unable to perform ROS Neurological: Positive for seizures.      Allergies  Minocycline  Home Medications   Prior to Admission medications  Medication Sig Start Date End Date Taking? Authorizing Provider  clonazePAM (KLONOPIN) 1 MG tablet Take 1 mg by mouth 3 (three) times daily.   Yes Historical Provider, MD  escitalopram (LEXAPRO) 10 MG tablet Take 1 tablet by mouth daily. 11/06/14  Yes Historical Provider, MD  omeprazole (PRILOSEC) 20 MG capsule Take 20 mg by mouth daily.    Yes Historical Provider, MD  ondansetron (ZOFRAN) 8 MG tablet Take 8 mg by mouth every 6 (six) hours as needed for vomiting.  01/31/14  Yes Historical Provider, MD  pregabalin (LYRICA) 300 MG capsule Take 1 capsule (300 mg total) by mouth at bedtime. 02/12/14  Yes Christiane Ha, MD  QUEtiapine (SEROQUEL) 50 MG tablet Take 1 tablet by mouth at bedtime. 10/09/14  Yes Historical Provider, MD  SUBOXONE 2-0.5 MG FILM 1 Film See admin  instructions. May use up to 3 films per day 11/01/14  Yes Historical Provider, MD  traZODone (DESYREL) 150 MG tablet Take 0.5 tablets (75 mg total) by mouth at bedtime as needed for sleep. Patient taking differently: Take 300 mg by mouth at bedtime as needed for sleep.  02/12/14  Yes Christiane Ha, MD  ibuprofen (ADVIL,MOTRIN) 800 MG tablet Take 1 tablet (800 mg total) by mouth 3 (three) times daily. Patient not taking: Reported on 11/18/2014 07/24/14   Toni Amend Forcucci, PA-C   BP 109/79 mmHg  Pulse 84  Temp(Src) 99 F (37.2 C) (Rectal)  Resp 11  SpO2 97%  LMP  (LMP Unknown) Physical Exam  Constitutional: She appears well-developed and well-nourished.  HENT:  Tenderness and ecchymosis to left cheek and left jaw.  Eyes: Pupils are equal, round, and reactive to light.  Cardiovascular: Normal rate.   Pulmonary/Chest: Effort normal.  Abdominal: Soft.  Musculoskeletal: She exhibits no tenderness.  Neurological:  Patient shaking both of her arms in front of her chest. Will look to her mom. Will not follow commands.  Skin: Skin is warm.    ED Course  Procedures (including critical care time) Labs Review Labs Reviewed  CBG MONITORING, ED    Imaging Review Ct Head Wo Contrast  11/19/2014   CLINICAL DATA:  Post fall. Syncopal episode. Possible seizure. Left-sided head pain.  EXAM: CT HEAD WITHOUT CONTRAST  TECHNIQUE: Contiguous axial images were obtained from the base of the skull through the vertex without intravenous contrast.  COMPARISON:  11/07/2014; 09/13/2014  FINDINGS: Gray-white differentiation is maintained. No CT evidence of acute large territory infarct. No intraparenchymal or extra-axial mass or hemorrhage. Normal size and configuration of the ventricles and basilar cisterns. No midline shift. Limited visualization of the paranasal sinuses and mastoid air cells is normal. No air-fluid levels. Regional soft tissues appear normal. No displaced calvarial fracture.  IMPRESSION:  Negative noncontrast head CT.   Electronically Signed   By: Simonne Come M.D.   On: 11/19/2014 01:45   Ct Maxillofacial Wo Cm  11/19/2014   CLINICAL DATA:  Multiple seizures in the past 12 hr. Bruising and swelling in LEFT side of face, suspected fall.  EXAM: CT MAXILLOFACIAL WITHOUT CONTRAST  TECHNIQUE: Multidetector CT imaging of the maxillofacial structures was performed. Multiplanar CT image reconstructions were also generated. A small metallic BB was placed on the right temple in order to reliably differentiate right from left.  COMPARISON:  CT head earlier today.  FINDINGS: There is no sinus air-fluid level. No orbital hematoma is present. No facial fracture is evident. Specifically no nasal bone, zygomatic arch, or maxillary sinus fracture. Mandible intact. TMJs  located. No blowout injury.  There is a soft tissue hematoma over the LEFT cheek. No radiopaque foreign body is evident. There is an incidental nasal ring attached to the LEFT nares. Visualized intracranial compartment negative. The patient is edentulous. No nasal cavity masses.  IMPRESSION: LEFT facial soft tissue swelling. No underlying facial fracture. No sinus air-fluid level or blowout injury.   Electronically Signed   By: Davonna BellingJohn  Curnes M.D.   On: 11/19/2014 14:06     EKG Interpretation   Date/Time:  Tuesday Nov 19 2014 12:50:36 EDT Ventricular Rate:  90 PR Interval:  128 QRS Duration: 82 QT Interval:  382 QTC Calculation: 467 R Axis:   81 Text Interpretation:  Sinus rhythm Probable left atrial enlargement  Baseline wander in lead(s) V3 Confirmed by Rubin PayorPICKERING  MD, Harrold DonathNATHAN [phone]([id number])  on 11/19/2014 3:35:25 PM      MDM   Final diagnoses:  Pseudoseizure  Facial contusion, initial encounter    Patient with reported "seizure activity". Has been seen for the same and diagnosed with nonepileptic seizures. Has had EEG during an episode that did not show abnormal activity. seen last night and had lab work and more imaging done. Face  CT added today.patient still has her decreased mental status and has had more episodes. Discussed with patient's mother. Will get psychiatric evaluation.   Benjiman CoreNathan Isaac Lacson, MD 11/19/14 1537

## 2014-11-19 NOTE — ED Notes (Signed)
Pt sts she wants to leave now, she was advised she will be leaving AMA, she voiced understanding. Dr Micheline Mazeocherty notified.

## 2014-11-20 NOTE — ED Provider Notes (Signed)
Pt offer to stay overnight for psych eval in the morning, has declined.   1. Pseudoseizure   2. Facial contusion, initial encounter      Toy CookeyMegan Docherty, MD 11/20/14 1052

## 2014-11-27 ENCOUNTER — Encounter (HOSPITAL_COMMUNITY): Payer: Self-pay | Admitting: *Deleted

## 2014-11-27 ENCOUNTER — Emergency Department (HOSPITAL_COMMUNITY)
Admission: EM | Admit: 2014-11-27 | Discharge: 2014-11-27 | Discharge status: Against Medical Advice | Payer: Managed Care, Other (non HMO) | Attending: Emergency Medicine | Admitting: Emergency Medicine

## 2014-11-27 DIAGNOSIS — J45901 Unspecified asthma with (acute) exacerbation: Secondary | ICD-10-CM | POA: Diagnosis not present

## 2014-11-27 DIAGNOSIS — Z79899 Other long term (current) drug therapy: Secondary | ICD-10-CM | POA: Diagnosis not present

## 2014-11-27 DIAGNOSIS — G4733 Obstructive sleep apnea (adult) (pediatric): Secondary | ICD-10-CM | POA: Insufficient documentation

## 2014-11-27 DIAGNOSIS — Z720 Tobacco use: Secondary | ICD-10-CM | POA: Insufficient documentation

## 2014-11-27 DIAGNOSIS — G8929 Other chronic pain: Secondary | ICD-10-CM | POA: Insufficient documentation

## 2014-11-27 DIAGNOSIS — F419 Anxiety disorder, unspecified: Secondary | ICD-10-CM | POA: Insufficient documentation

## 2014-11-27 DIAGNOSIS — K219 Gastro-esophageal reflux disease without esophagitis: Secondary | ICD-10-CM | POA: Insufficient documentation

## 2014-11-27 DIAGNOSIS — Z8614 Personal history of Methicillin resistant Staphylococcus aureus infection: Secondary | ICD-10-CM | POA: Insufficient documentation

## 2014-11-27 DIAGNOSIS — I10 Essential (primary) hypertension: Secondary | ICD-10-CM | POA: Insufficient documentation

## 2014-11-27 DIAGNOSIS — R55 Syncope and collapse: Secondary | ICD-10-CM | POA: Insufficient documentation

## 2014-11-27 DIAGNOSIS — F319 Bipolar disorder, unspecified: Secondary | ICD-10-CM | POA: Diagnosis not present

## 2014-11-27 DIAGNOSIS — R4182 Altered mental status, unspecified: Secondary | ICD-10-CM | POA: Diagnosis present

## 2014-11-27 NOTE — ED Provider Notes (Signed)
CSN: [id number]     Arrival date & time 11/27/14  2002 History   First MD Initiated Contact with Patient 11/27/14 2053     Chief Complaint  Patient presents with  . Altered Mental Status     (Consider location/radiation/quality/duration/timing/severity/associated sxs/prior Treatment) HPI  Rachael Ramirez is a 44 y.o. female with past medical history of anxiety, bipolar disorder, drug abuse presenting today after syncopal episode. Patient states that she does not know what happened today. She states she did take benzodiazepine however this is her normal prescribed dose. She denies any alcohol or drug use. She denies any pain in her body aside from her chronic back pain. She is currently requesting to leave AGAINST MEDICAL ADVICE after being transferred to emergency department by EMS.  10 Systems reviewed and are negative for acute change except as noted in the HPI.    Past Medical History  Diagnosis Date  . Back pain   . Asthma     uses Combivent daily as needed  . Insomnia     takes Trazodone nightly  . Anxiety     takes Klonopin daily as needed  . PONV (postoperative nausea and vomiting)   . Cough   . Spinal headache   . Weakness     left leg  . GERD (gastroesophageal reflux disease)     takes Omeprazole daily  . Urinary urgency   . Bipolar 1 disorder     takes Lamictal daily  . Chronic low back pain     HNP and Radiculopathy  . MRSA (methicillin resistant Staphylococcus aureus)     Postive nasal swab  . Hypertension   . Collapsed lung 3/15    "while in hospital awaiting back OR"   Past Surgical History  Procedure Laterality Date  . Percutaneous pinning Right 1992    "hand"  . Anterior cervical decomp/discectomy fusion  1996    "took bone off her hip"  . Elbow fracture surgery Left 1975  . Elbow fracture surgery Left 2002    "redo"  . Lumbar disc surgery  2008 X 2  . Posterior lumbar fusion  2008  . Hardware removal  2008    "took screw out of lower back"  .  Epidural block injection    . Lumbar laminectomy/decompression microdiscectomy Left 07/13/2013    Procedure: Left Lumbar four-five microdiskectomy;  Surgeon: Carmela Hurt, MD;  Location: [facility];  Service: Neurosurgery;  Laterality: Left;  Left Lumbar four-five microdiskectomy  . Lumbar wound debridement N/A 09/21/2013    Procedure: LUMBAR WOUND re-exploration and disectomy;  Surgeon: Carmela Hurt, MD;  Location: [facility];  Service: Neurosurgery;  Laterality: N/A;  . Tonsillectomy    . Fracture surgery    . Back surgery     Family History  Problem Relation Age of Onset  . Multiple sclerosis Mother   . COPD Father   . Alcohol abuse Maternal Grandfather   . Alcohol abuse Brother   . Cirrhosis Brother    History  Substance Use Topics  . Smoking status: Current Every Day Smoker -- 1.00 packs/day for 18 years    Types: Cigarettes  . Smokeless tobacco: Never Used  . Alcohol Use: No   OB History    No data available     Review of Systems    Allergies  Minocycline  Home Medications   Prior to Admission medications   Medication Sig Start Date End Date Taking? Authorizing Provider  clonazePAM (KLONOPIN) 1 MG tablet  Take 1 mg by mouth 3 (three) times daily.    Historical Provider, MD  escitalopram (LEXAPRO) 10 MG tablet Take 1 tablet by mouth daily. 11/06/14   Historical Provider, MD  ibuprofen (ADVIL,MOTRIN) 800 MG tablet Take 1 tablet (800 mg total) by mouth 3 (three) times daily. Patient not taking: Reported on 11/18/2014 07/24/14   Terri Piedra, PA-C  omeprazole (PRILOSEC) 20 MG capsule Take 20 mg by mouth daily.     Historical Provider, MD  ondansetron (ZOFRAN) 8 MG tablet Take 8 mg by mouth every 6 (six) hours as needed for vomiting.  01/31/14   Historical Provider, MD  pregabalin (LYRICA) 300 MG capsule Take 1 capsule (300 mg total) by mouth at bedtime. 02/12/14   Christiane Ha, MD  QUEtiapine (SEROQUEL) 50 MG tablet Take 1 tablet by mouth at bedtime. 10/09/14    Historical Provider, MD  SUBOXONE 2-0.5 MG FILM 1 Film See admin instructions. May use up to 3 films per day 11/01/14   Historical Provider, MD  traZODone (DESYREL) 150 MG tablet Take 0.5 tablets (75 mg total) by mouth at bedtime as needed for sleep. Patient taking differently: Take 300 mg by mouth at bedtime as needed for sleep.  02/12/14   Christiane Ha, MD   BP 145/83 mmHg  Pulse 90  Temp(Src) 97.7 F (36.5 C) (Oral)  Resp 16  SpO2 97%  LMP  (LMP Unknown) Physical Exam  Constitutional: She is oriented to person, place, and time. She appears well-developed and well-nourished. No distress.  HENT:  Head: Normocephalic and atraumatic.  Nose: Nose normal.  Mouth/Throat: Oropharynx is clear and moist. No oropharyngeal exudate.  Eyes: Conjunctivae and EOM are normal. Pupils are equal, round, and reactive to light. No scleral icterus.  Neck: Normal range of motion. Neck supple. No JVD present. No tracheal deviation present. No thyromegaly present.  Cardiovascular: Normal rate, regular rhythm and normal heart sounds.  Exam reveals no gallop and no friction rub.   No murmur heard. Pulmonary/Chest: Effort normal. No respiratory distress. She has wheezes. She exhibits no tenderness.  Inspiratorya nd expiratory Wheezing heard in bilateral lung fields.  Abdominal: Soft. Bowel sounds are normal. She exhibits no distension and no mass. There is no tenderness. There is no rebound and no guarding.  Musculoskeletal: Normal range of motion. She exhibits no edema or tenderness.  Lymphadenopathy:    She has no cervical adenopathy.  Neurological: She is alert and oriented to person, place, and time. No cranial nerve deficit. She exhibits normal muscle tone.  Normal strength and sensation in all extremities, normal cerebellar testing, normal gait.  Skin: Skin is warm and dry. No rash noted. She is not diaphoretic. No erythema. No pallor.  Psychiatric:  Denies SI or HI  Nursing note and vitals  reviewed.   ED Course  Procedures (including critical care time) Labs Review Labs Reviewed - No data to display  Imaging Review No results found.   EKG Interpretation   Date/Time:  Wednesday November 27 2014 20:16:44 EDT Ventricular Rate:  82 PR Interval:  140 QRS Duration: 81 QT Interval:  390 QTC Calculation: 455 R Axis:   71 Text Interpretation:  Sinus rhythm Anterior infarct, old No significant  change since last tracing Confirmed by Erroll Luna [phone]) on  11/27/2014 8:49:13 PM      MDM   Final diagnoses:  Syncope, unspecified syncope type   patient since emergency department after syncopal episode. She is unable to give me further history. She  is currently requesting to leave AMA. Only an EKG has been performed which does not show the etiology of her syncope. I spoke with the patient at length to try to convince her to stay but she simply wants to call a friend and go home. She does possess decision making capacity at this time. She is not clinically intoxicated. Patient was urged to return to emergency department immediately to complete for evaluation. She was told the possible risk of leaving his death.  Tomasita CrumbleAdeleke Eyana Stolze, MD 11/27/14 [phone]

## 2014-11-27 NOTE — ED Notes (Signed)
Bed: RESB Expected date:  Expected time:  Means of arrival:  Comments: EMS 44 yo altered mental status

## 2014-11-27 NOTE — Discharge Instructions (Signed)
Syncope Rachael Ramirez, you are leaving AGAINST MEDICAL ADVICE.  You have not had a work up complete to find out why you passed out.  If you leave, you are risking possible death.  Return to the ED immediately to have your evaluation completed.  Also see your primary physician within 3 days for close follow-up. Thank you. Syncope means a person passes out (faints). The person usually wakes up in less than 5 minutes. It is important to seek medical care for syncope. HOME CARE  Have someone stay with you until you feel normal.  Do not drive, use machines, or play sports until your doctor says it is okay.  Keep all doctor visits as told.  Lie down when you feel like you might pass out. Take deep breaths. Wait until you feel normal before standing up.  Drink enough fluids to keep your pee (urine) clear or pale yellow.  If you take blood pressure or heart medicine, get up slowly. Take several minutes to sit and then stand. GET HELP RIGHT AWAY IF:   You have a severe headache.  You have pain in the chest, belly (abdomen), or back.  You are bleeding from the mouth or butt (rectum).  You have black or tarry poop (stool).  You have an irregular or very fast heartbeat.  You have pain with breathing.  You keep passing out, or you have shaking (seizures) when you pass out.  You pass out when sitting or lying down.  You feel confused.  You have trouble walking.  You have severe weakness.  You have vision problems. If you fainted, call your local emergency services (911 in [place] Do not drive yourself to the hospital. MAKE SURE YOU:   Understand these instructions.  Will watch your condition.  Will get help right away if you are not doing well or get worse. Document Released: 12/01/2007 Document Revised: 12/14/2011 Document Reviewed: 08/13/2011 [facility] Patient Information 2015 [place], [place]. This information is not intended to replace advice given to you by your health care provider.  Make sure you discuss any questions you have with your health care provider.
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
## 2015-01-07 ENCOUNTER — Emergency Department (HOSPITAL_COMMUNITY): Payer: Managed Care, Other (non HMO)

## 2015-01-07 ENCOUNTER — Encounter (HOSPITAL_COMMUNITY): Payer: Self-pay | Admitting: *Deleted

## 2015-01-07 ENCOUNTER — Inpatient Hospital Stay (HOSPITAL_COMMUNITY)
Admission: EM | Admit: 2015-01-07 | Discharge: 2015-01-08 | DRG: 193 | Discharge status: Against Medical Advice | Payer: Managed Care, Other (non HMO) | Attending: Internal Medicine | Admitting: Internal Medicine

## 2015-01-07 DIAGNOSIS — J189 Pneumonia, unspecified organism: Secondary | ICD-10-CM | POA: Diagnosis present

## 2015-01-07 DIAGNOSIS — R059 Cough, unspecified: Secondary | ICD-10-CM

## 2015-01-07 DIAGNOSIS — F319 Bipolar disorder, unspecified: Secondary | ICD-10-CM | POA: Diagnosis present

## 2015-01-07 DIAGNOSIS — J45909 Unspecified asthma, uncomplicated: Secondary | ICD-10-CM | POA: Diagnosis present

## 2015-01-07 DIAGNOSIS — J209 Acute bronchitis, unspecified: Secondary | ICD-10-CM | POA: Diagnosis present

## 2015-01-07 DIAGNOSIS — M545 Low back pain, unspecified: Secondary | ICD-10-CM | POA: Diagnosis present

## 2015-01-07 DIAGNOSIS — R0602 Shortness of breath: Secondary | ICD-10-CM

## 2015-01-07 DIAGNOSIS — F1010 Alcohol abuse, uncomplicated: Secondary | ICD-10-CM | POA: Diagnosis present

## 2015-01-07 DIAGNOSIS — Z981 Arthrodesis status: Secondary | ICD-10-CM | POA: Diagnosis not present

## 2015-01-07 DIAGNOSIS — E860 Dehydration: Secondary | ICD-10-CM | POA: Diagnosis present

## 2015-01-07 DIAGNOSIS — Z79899 Other long term (current) drug therapy: Secondary | ICD-10-CM | POA: Diagnosis not present

## 2015-01-07 DIAGNOSIS — G8929 Other chronic pain: Secondary | ICD-10-CM | POA: Diagnosis present

## 2015-01-07 DIAGNOSIS — R05 Cough: Secondary | ICD-10-CM

## 2015-01-07 DIAGNOSIS — Z881 Allergy status to other antibiotic agents status: Secondary | ICD-10-CM

## 2015-01-07 DIAGNOSIS — J9601 Acute respiratory failure with hypoxia: Secondary | ICD-10-CM | POA: Diagnosis present

## 2015-01-07 DIAGNOSIS — G4700 Insomnia, unspecified: Secondary | ICD-10-CM | POA: Diagnosis present

## 2015-01-07 DIAGNOSIS — F419 Anxiety disorder, unspecified: Secondary | ICD-10-CM | POA: Diagnosis present

## 2015-01-07 DIAGNOSIS — Z720 Tobacco use: Secondary | ICD-10-CM | POA: Diagnosis present

## 2015-01-07 DIAGNOSIS — J180 Bronchopneumonia, unspecified organism: Secondary | ICD-10-CM | POA: Diagnosis present

## 2015-01-07 DIAGNOSIS — R0902 Hypoxemia: Secondary | ICD-10-CM

## 2015-01-07 DIAGNOSIS — I10 Essential (primary) hypertension: Secondary | ICD-10-CM | POA: Diagnosis present

## 2015-01-07 DIAGNOSIS — F17210 Nicotine dependence, cigarettes, uncomplicated: Secondary | ICD-10-CM | POA: Diagnosis present

## 2015-01-07 DIAGNOSIS — K219 Gastro-esophageal reflux disease without esophagitis: Secondary | ICD-10-CM | POA: Diagnosis present

## 2015-01-07 HISTORY — DX: Pneumonia, unspecified organism: J18.9

## 2015-01-07 LAB — CBC WITH DIFFERENTIAL/PLATELET
BASOS ABS: 0 10*3/uL (ref 0.0–0.1)
BASOS PCT: 0 % (ref 0–1)
Eosinophils Absolute: 0.2 10*3/uL (ref 0.0–0.7)
Eosinophils Relative: 2 % (ref 0–5)
HCT: 41.9 % (ref 36.0–46.0)
Hemoglobin: 13.8 g/dL (ref 12.0–15.0)
LYMPHS ABS: 1.4 10*3/uL (ref 0.7–4.0)
Lymphocytes Relative: 15 % (ref 12–46)
MCH: 31.4 pg (ref 26.0–34.0)
MCHC: 32.9 g/dL (ref 30.0–36.0)
MCV: 95.4 fL (ref 78.0–100.0)
Monocytes Absolute: 0.4 10*3/uL (ref 0.1–1.0)
Monocytes Relative: 5 % (ref 3–12)
NEUTROS ABS: 7.3 10*3/uL (ref 1.7–7.7)
Neutrophils Relative %: 78 % — ABNORMAL HIGH (ref 43–77)
Platelets: 258 10*3/uL (ref 150–400)
RBC: 4.39 MIL/uL (ref 3.87–5.11)
RDW: 14.7 % (ref 11.5–15.5)
WBC: 9.3 10*3/uL (ref 4.0–10.5)

## 2015-01-07 LAB — I-STAT TROPONIN, ED: TROPONIN I, POC: 0 ng/mL (ref 0.00–0.08)

## 2015-01-07 LAB — BASIC METABOLIC PANEL
Anion gap: 10 (ref 5–15)
BUN: 5 mg/dL — ABNORMAL LOW (ref 6–20)
CHLORIDE: 104 mmol/L (ref 101–111)
CO2: 23 mmol/L (ref 22–32)
CREATININE: 0.81 mg/dL (ref 0.44–1.00)
Calcium: 8.9 mg/dL (ref 8.9–10.3)
GFR calc non Af Amer: >60 mL/min (ref >60)
Glucose, Bld: 107 mg/dL — ABNORMAL HIGH (ref 65–99)
Potassium: 4 mmol/L (ref 3.5–5.1)
SODIUM: 137 mmol/L (ref 135–145)

## 2015-01-07 LAB — MRSA PCR SCREENING: MRSA BY PCR: NEGATIVE

## 2015-01-07 MED ORDER — IBUPROFEN 800 MG PO TABS
800.0000 mg | ORAL_TABLET | Freq: Once | ORAL | Status: AC
  Administered 2015-01-07: 800 mg via ORAL
  Filled 2015-01-07: qty 1

## 2015-01-07 MED ORDER — ENOXAPARIN SODIUM 40 MG/0.4ML ~~LOC~~ SOLN
40.0000 mg | SUBCUTANEOUS | Status: DC
  Administered 2015-01-07: 40 mg via SUBCUTANEOUS
  Filled 2015-01-07 (×2): qty 0.4

## 2015-01-07 MED ORDER — IPRATROPIUM-ALBUTEROL 0.5-2.5 (3) MG/3ML IN SOLN
3.0000 mL | Freq: Four times a day (QID) | RESPIRATORY_TRACT | Status: DC
Start: 2015-01-07 — End: 2015-01-08
  Administered 2015-01-07 – 2015-01-08 (×4): 3 mL via RESPIRATORY_TRACT
  Filled 2015-01-07 (×4): qty 3

## 2015-01-07 MED ORDER — SODIUM CHLORIDE 0.9 % IV SOLN
INTRAVENOUS | Status: DC
  Administered 2015-01-07: 16:00:00 via INTRAVENOUS
  Administered 2015-01-07: 1000 mL via INTRAVENOUS
  Administered 2015-01-08: 06:00:00 via INTRAVENOUS

## 2015-01-07 MED ORDER — QUETIAPINE FUMARATE 100 MG PO TABS
100.0000 mg | ORAL_TABLET | Freq: Every day | ORAL | Status: DC
Start: 2015-01-07 — End: 2015-01-08
  Administered 2015-01-07: 100 mg via ORAL
  Filled 2015-01-07 (×2): qty 1

## 2015-01-07 MED ORDER — ALBUTEROL (5 MG/ML) CONTINUOUS INHALATION SOLN
10.0000 mg/h | INHALATION_SOLUTION | RESPIRATORY_TRACT | Status: DC
  Administered 2015-01-07: 10 mg/h via RESPIRATORY_TRACT
  Filled 2015-01-07: qty 20

## 2015-01-07 MED ORDER — IPRATROPIUM-ALBUTEROL 0.5-2.5 (3) MG/3ML IN SOLN: 3.0000 mL | Freq: Four times a day (QID) | RESPIRATORY_TRACT | Status: DC

## 2015-01-07 MED ORDER — AZITHROMYCIN 500 MG PO TABS
500.0000 mg | ORAL_TABLET | ORAL | Status: DC
  Administered 2015-01-08: 500 mg via ORAL
  Filled 2015-01-07: qty 1

## 2015-01-07 MED ORDER — CLONAZEPAM 1 MG PO TABS
1.0000 mg | ORAL_TABLET | Freq: Three times a day (TID) | ORAL | Status: DC
Start: 2015-01-07 — End: 2015-01-08
  Administered 2015-01-07 – 2015-01-08 (×3): 1 mg via ORAL
  Filled 2015-01-07 (×3): qty 1

## 2015-01-07 MED ORDER — DEXTROSE 5 % IV SOLN
1.0000 g | Freq: Once | INTRAVENOUS | Status: AC
  Administered 2015-01-07: 1 g via INTRAVENOUS
  Filled 2015-01-07: qty 10

## 2015-01-07 MED ORDER — ALBUTEROL (5 MG/ML) CONTINUOUS INHALATION SOLN: 10.0000 mg/h | INHALATION_SOLUTION | RESPIRATORY_TRACT | Status: DC

## 2015-01-07 MED ORDER — IOHEXOL 350 MG/ML SOLN
100.0000 mL | Freq: Once | INTRAVENOUS | Status: AC | PRN
  Administered 2015-01-07: 80 mL via INTRAVENOUS

## 2015-01-07 MED ORDER — CEFTRIAXONE SODIUM IN DEXTROSE 20 MG/ML IV SOLN
1.0000 g | INTRAVENOUS | Status: DC
  Administered 2015-01-08: 1 g via INTRAVENOUS
  Filled 2015-01-07: qty 50

## 2015-01-07 MED ORDER — CEFTRIAXONE SODIUM IN DEXTROSE 20 MG/ML IV SOLN: 1.0000 g | INTRAVENOUS | Status: DC

## 2015-01-07 MED ORDER — PREGABALIN 100 MG PO CAPS
300.0000 mg | ORAL_CAPSULE | Freq: Every day | ORAL | Status: DC
  Administered 2015-01-07: 300 mg via ORAL
  Filled 2015-01-07: qty 3

## 2015-01-07 MED ORDER — HYDROCODONE-HOMATROPINE 5-1.5 MG/5ML PO SYRP: 5.0000 mL | ORAL_SOLUTION | ORAL | Status: DC | PRN

## 2015-01-07 MED ORDER — DEXTROSE 5 % IV SOLN
500.0000 mg | Freq: Once | INTRAVENOUS | Status: AC
  Administered 2015-01-07: 500 mg via INTRAVENOUS
  Filled 2015-01-07: qty 500

## 2015-01-07 MED ORDER — ONDANSETRON HCL 4 MG/2ML IJ SOLN: 4.0000 mg | Freq: Four times a day (QID) | INTRAMUSCULAR | Status: DC | PRN

## 2015-01-07 MED ORDER — DEXTROSE 5 % IV SOLN
500.0000 mg | Freq: Once | INTRAVENOUS | Status: DC
  Filled 2015-01-07: qty 500

## 2015-01-07 MED ORDER — PROMETHAZINE HCL 25 MG/ML IJ SOLN
12.5000 mg | Freq: Four times a day (QID) | INTRAMUSCULAR | Status: DC | PRN
  Administered 2015-01-08: 25 mg via INTRAVENOUS
  Filled 2015-01-07 (×2): qty 1

## 2015-01-07 MED ORDER — BENZONATATE 100 MG PO CAPS
200.0000 mg | ORAL_CAPSULE | Freq: Three times a day (TID) | ORAL | Status: DC
  Administered 2015-01-07 – 2015-01-08 (×4): 200 mg via ORAL
  Filled 2015-01-07 (×5): qty 2

## 2015-01-07 NOTE — Progress Notes (Addendum)
Patient trasfered from ED to [phone] via stretcher; alert and oriented x 4; no complaints of pain; IV in LAC running fluids @100cc /hr; skin intact (tatoos) - skin assessed by floor nurse and charge nurse. Orient patient to room and unit; watch safety video; gave patient care guide; instructed how to use the call bell and  fall risk precautions. Will continue to monitor the patient.

## 2015-01-07 NOTE — ED Provider Notes (Signed)
CSN: [id number]     Arrival date & time 01/07/15  0907 History   First MD Initiated Contact with Patient 01/07/15 [phone]     Chief Complaint  Patient presents with  . Shortness of Breath  . Chest Pain     (Consider location/radiation/quality/duration/timing/severity/associated sxs/prior Treatment) HPI Rachael Ramirez is a 44 year old female past medical history of asthma, anxiety, bipolar 1 disorder who presents the ER complaining of shortness of breath and chest discomfort. Patient reports over the last week she's had a mildly productive cough with associated exertional shortness of breath. Patient states her shortness of breath has been progressively worsening as well as the cough. Patient reports over the past 2-3 days she has been expressing a central chest sharp pain. She states this pain is constant, however is made acutely worse with coughing and with deep inspiration. Patient states she typically uses a rescue inhaler at home for her asthma, has never been hospitalized for asthma exacerbation. Patient denies any associated palpitations, headache, blurred vision, dizziness, weakness, vomiting, abdominal pain, dysuria, diarrhea.  Past Medical History  Diagnosis Date  . Back pain   . Asthma     uses Combivent daily as needed  . Insomnia     takes Trazodone nightly  . Anxiety     takes Klonopin daily as needed  . PONV (postoperative nausea and vomiting)   . Cough   . Spinal headache   . Weakness     left leg  . GERD (gastroesophageal reflux disease)     takes Omeprazole daily  . Urinary urgency   . Bipolar 1 disorder     takes Lamictal daily  . Chronic low back pain     HNP and Radiculopathy  . MRSA (methicillin resistant Staphylococcus aureus)     Postive nasal swab  . Hypertension   . Collapsed lung 3/15    "while in hospital awaiting back OR"   Past Surgical History  Procedure Laterality Date  . Percutaneous pinning Right 1992    "hand"  . Anterior cervical  decomp/discectomy fusion  1996    "took bone off her hip"  . Elbow fracture surgery Left 1975  . Elbow fracture surgery Left 2002    "redo"  . Lumbar disc surgery  2008 X 2  . Posterior lumbar fusion  2008  . Hardware removal  2008    "took screw out of lower back"  . Epidural block injection    . Lumbar laminectomy/decompression microdiscectomy Left 07/13/2013    Procedure: Left Lumbar four-five microdiskectomy;  Surgeon: Carmela HurtKyle L Cabbell, MD;  Location: [facility];  Service: Neurosurgery;  Laterality: Left;  Left Lumbar four-five microdiskectomy  . Lumbar wound debridement N/A 09/21/2013    Procedure: LUMBAR WOUND re-exploration and disectomy;  Surgeon: Carmela HurtKyle L Cabbell, MD;  Location: [facility];  Service: Neurosurgery;  Laterality: N/A;  . Tonsillectomy    . Fracture surgery    . Back surgery     Family History  Problem Relation Age of Onset  . Multiple sclerosis Mother   . COPD Father   . Alcohol abuse Maternal Grandfather   . Alcohol abuse Brother   . Cirrhosis Brother    History  Substance Use Topics  . Smoking status: Current Every Day Smoker -- 1.00 packs/day for 18 years    Types: Cigarettes  . Smokeless tobacco: Never Used  . Alcohol Use: No   OB History    No data available     Review of Systems  Constitutional: Negative for fever.  HENT: Negative for trouble swallowing.   Eyes: Negative for visual disturbance.  Respiratory: Positive for cough and shortness of breath.   Cardiovascular: Negative for chest pain.  Gastrointestinal: Negative for nausea, vomiting and abdominal pain.  Genitourinary: Negative for dysuria.  Musculoskeletal: Negative for neck pain.       Chest wall pain  Skin: Negative for rash.  Neurological: Negative for dizziness, weakness and numbness.  Psychiatric/Behavioral: Negative.       Allergies  Minocycline  Home Medications   Prior to Admission medications   Medication Sig Start Date End Date Taking? Authorizing Provider   clonazePAM (KLONOPIN) 1 MG tablet Take 1 mg by mouth 3 (three) times daily.   Yes Historical Provider, MD  COMBIVENT RESPIMAT 20-100 MCG/ACT AERS respimat Inhale 1-2 puffs into the lungs every 6 (six) hours as needed. 10/15/14  Yes Historical Provider, MD  ondansetron (ZOFRAN) 8 MG tablet Take 8 mg by mouth every 6 (six) hours as needed for vomiting.  01/31/14  Yes Historical Provider, MD  pregabalin (LYRICA) 300 MG capsule Take 1 capsule (300 mg total) by mouth at bedtime. 02/12/14  Yes Christiane Ha, MD  QUEtiapine (SEROQUEL) 100 MG tablet Take 100 mg by mouth at bedtime.   Yes Historical Provider, MD  ibuprofen (ADVIL,MOTRIN) 800 MG tablet Take 1 tablet (800 mg total) by mouth 3 (three) times daily. Patient not taking: Reported on 11/18/2014 07/24/14   Terri Piedra, PA-C  traZODone (DESYREL) 150 MG tablet Take 0.5 tablets (75 mg total) by mouth at bedtime as needed for sleep. Patient not taking: Reported on 01/07/2015 02/12/14   Christiane Ha, MD   BP 112/75 mmHg  Pulse 95  Temp(Src) 99.4 F (37.4 C) (Oral)  Resp 16  Ht 5\' 3"  (1.6 m)  Wt 113 lb (51.256 kg)  BMI 20.02 kg/m2  SpO2 93%  LMP 12/24/2014 Physical Exam  Constitutional: She is oriented to person, place, and time. She appears well-developed and well-nourished. No distress.  HENT:  Head: Normocephalic and atraumatic.  Mouth/Throat: Oropharynx is clear and moist. No oropharyngeal exudate.  Eyes: Right eye exhibits no discharge. Left eye exhibits no discharge. No scleral icterus.  Neck: Normal range of motion.  Cardiovascular: Normal rate, regular rhythm and normal heart sounds.   No murmur heard. Pulmonary/Chest: Effort normal. No respiratory distress. She has wheezes in the right lower field and the left lower field.  Abdominal: Soft. There is no tenderness.  Musculoskeletal: Normal range of motion. She exhibits no edema or tenderness.  Neurological: She is alert and oriented to person, place, and time. No cranial  nerve deficit. Coordination normal.  Skin: Skin is warm and dry. No rash noted. She is not diaphoretic.  Psychiatric: She has a normal mood and affect.  Nursing note and vitals reviewed.   ED Course  Procedures (including critical care time) Labs Review Labs Reviewed  CBC WITH DIFFERENTIAL/PLATELET - Abnormal; Notable for the following:    Neutrophils Relative % 78 (*)    All other components within normal limits  BASIC METABOLIC PANEL - Abnormal; Notable for the following:    Glucose, Bld 107 (*)    BUN 5 (*)    All other components within normal limits  I-STAT TROPOININ, ED    Imaging Review Dg Chest 2 View  01/07/2015   CLINICAL DATA:  Intermittent chest pain with nonproductive cough. Wheezing.  EXAM: CHEST  2 VIEW  COMPARISON:  Chest radiographs February 05, 2014 and February 07, 2014 and February 08, 2014 chest CT  FINDINGS: Lungs are clear. Heart size and pulmonary vascularity are normal. No adenopathy. No pneumothorax. There is stable anterior wedging at the L1 vertebral body with localized levoscoliosis at T12-L1.  IMPRESSION: No edema or consolidation. Anterior wedging of the L1 vertebral body would thoracolumbar levoscoliosis at this site, stable.   Electronically Signed   By: Bretta Bang III M.D.   On: 01/07/2015 10:17   Ct Angio Chest Pe W/cm &/or Wo Cm  01/07/2015   CLINICAL DATA:  Midline chest pain, nonproductive cough and fever for 3 days.  EXAM: CT ANGIOGRAPHY CHEST WITH CONTRAST  TECHNIQUE: Multidetector CT imaging of the chest was performed using the standard protocol during bolus administration of intravenous contrast. Multiplanar CT image reconstructions and MIPs were obtained to evaluate the vascular anatomy.  CONTRAST:  80mL OMNIPAQUE IOHEXOL 350 MG/ML SOLN  COMPARISON:  Chest CT 02/08/2014  FINDINGS: Chest wall: No breast masses, supraclavicular or axillary lymphadenopathy. The thyroid gland appears normal. The bony thorax is intact.  Mediastinum: The heart is normal  in size. No pericardial effusion. The aorta is normal in caliber. No dissection. The pulmonary arterial tree is well opacified. No filling defects to suggest pulmonary embolism. The esophagus is grossly normal. There are scattered mediastinal lymph nodes and borderline enlarged bilateral hilar lymph nodes.  Lungs/pleura: There is fairly extensive peribronchial thickening suggesting severe bronchitis. There are also patchy areas of ground-glass opacity mainly in the upper lobes which could be secondary alveolitis or early bronchopneumonia. No pulmonary edema or pleural effusions. No worrisome pulmonary lesions.  Upper abdomen:  No significant findings.  Review of the MIP images confirms the above findings.  IMPRESSION: 1. No CT findings for pulmonary embolism. 2. Normal thoracic aorta. 3. Severe bronchitis and patchy ground-glass opacities likely representing alveolitis or early bronchopneumonia.   Electronically Signed   By: Rudie Meyer M.D.   On: 01/07/2015 14:46     EKG Interpretation   Date/Time:  Tuesday January 07 2015 09:13:33 EDT Ventricular Rate:  117 PR Interval:  134 QRS Duration: 86 QT Interval:  333 QTC Calculation: 465 R Axis:   63 Text Interpretation:  Sinus tachycardia Anterior infarct, old Non-specific  ST-t changes Confirmed by Juleen China  MD, STEPHEN ([id number]) on 01/07/2015 10:02:35  AM      MDM   Final diagnoses:  Cough  SOB (shortness of breath)  Hypoxia    Patient here with increasing dyspnea on exertion over the past week. Productive cough noted along with wheezes noted by EMS. Wheezes have significantly decreased on arrival here, however patient continues to drop oxygen saturation into the high 80s on room air. Continuous albuterol treatment given, this did not improve hypoxia. Patient is overall well-appearing in no acute distress. Chest radiographs not show any evidence of acute abnormalities, given patient's continued hypoxia will follow-up CT angiogram of her chest. EKG is  unremarkable for acute injury or ectopy. Troponin negative greater than 6 hours after onset of symptoms. Patient's chest discomfort is atypical for ACS. It is reproducible with inspiration and coughing.  CT angiogram of chest with impression: 1. No CT findings for pulmonary embolism. 2. Normal thoracic aorta. 3. Severe bronchitis and patchy ground-glass opacities likely representing alveolitis or early bronchopneumonia.  Given these findings and the patient's new oxygen requirement, patient will be admitted for community-acquired pneumonia. Patient placed on erythromycin and Rocephin area spoke with [facility] hospitalist to admit. The patient appears reasonably stabilized for admission considering the current resources, flow,  and capabilities available in the ED at this time, and I doubt any other [facility] requiring further screening and/or treatment in the ED prior to admission.  BP 112/75 mmHg  Pulse 95  Temp(Src) 99.4 F (37.4 C) (Oral)  Resp 16  Ht  (1.6 m)  Wt 113 lb (51.256 kg)  BMI 20.02 kg/m2  SpO2 93%  LMP 12/24/2014  Signed,  Ladona Mow, PA-C 4:21 PM  Patient discussed with Dr. Raeford Razor, MD     Ladona Mow, PA-C 01/07/15 1621  Raeford Razor, MD 01/08/15 [phone]

## 2015-01-07 NOTE — H&P (Signed)
[facility] History and Physical                                                                                    Rachael Ramirez, is a 44 y.o. female  MRN: [id number]   DOB - 1971-01-22  Admit Date - 01/07/2015  Outpatient Primary MD for the patient is [name], MD  Referring MD: Juleen China / ER  With History of -  Past Medical History  Diagnosis Date  . Back pain   . Asthma     uses Combivent daily as needed  . Insomnia     takes Trazodone nightly  . Anxiety     takes Klonopin daily as needed  . PONV (postoperative nausea and vomiting)   . Cough   . Spinal headache   . Weakness     left leg  . GERD (gastroesophageal reflux disease)     takes Omeprazole daily  . Urinary urgency   . Bipolar 1 disorder     takes Lamictal daily  . Chronic low back pain     HNP and Radiculopathy  . MRSA (methicillin resistant Staphylococcus aureus)     Postive nasal swab  . Hypertension   . Collapsed lung 3/15    "while in hospital awaiting back OR"      Past Surgical History  Procedure Laterality Date  . Percutaneous pinning Right 1992    "hand"  . Anterior cervical decomp/discectomy fusion  1996    "took bone off her hip"  . Elbow fracture surgery Left 1975  . Elbow fracture surgery Left 2002    "redo"  . Lumbar disc surgery  2008 X 2  . Posterior lumbar fusion  2008  . Hardware removal  2008    "took screw out of lower back"  . Epidural block injection    . Lumbar laminectomy/decompression microdiscectomy Left 07/13/2013    Procedure: Left Lumbar four-five microdiskectomy;  Surgeon: Carmela Hurt, MD;  Location: [facility];  Service: Neurosurgery;  Laterality: Left;  Left Lumbar four-five microdiskectomy  . Lumbar wound debridement N/A 09/21/2013    Procedure: LUMBAR WOUND re-exploration and disectomy;  Surgeon: Carmela Hurt, MD;  Location: [facility];  Service: Neurosurgery;  Laterality: N/A;  . Tonsillectomy    . Fracture surgery    . Back surgery       in for   Chief Complaint  Patient presents with  . Shortness of Breath  . Chest Pain     HPI This is a 44 year old female smoker with history of chronic low back pain, bipolar disorder and asthma who presented to the ER with a 4 day history of non productive cough and congestion and generalized weakness and fatigue. This was associated with shortness of breath and pleuritic sharp chest pains. She attempted use her rescue inhaler at home without any success.  On presentation to the ER she was hypoxemic with O2 sats around 88%. Saturations improved to 95-97% on oxygen but she continued to have desaturations off of oxygen. She was given 2 nebulizer treatments in the ER. Because of the degree of hypoxemia and no significant findings on chest x-ray a  CT of the chest was ordered that did not reveal pulmonary embolism but instead revealed severe bronchitis with early bronchopneumonia. She was afebrile and otherwise hemodynamically stable with no signs of sepsis. Her labs were unremarkable except for mildly elevated neutrophil count 78%. Her troponin was negative at 0.00. Because of persistent hypoxemia she required admission to the hospital. In addition to above she is reporting poor oral intake and inability to have a bowel movement for several days. She states she quit smoking several days ago because of the severity of her respiratory symptoms. She has not been around any sick contacts that she is aware of.   Review of Systems   In addition to the HPI above,  No Fever-chills, myalgias or other constitutional symptoms No Headache, changes with Vision or hearing, new weakness, tingling, numbness in any extremity, No problems swallowing food or Liquids, indigestion/reflux No orthopnea  No Abdominal pain, no melena or hematochezia, no dark tarry stools No dysuria, hematuria or flank pain No new skin rashes, lesions, masses or bruises, No new joints pains-aches No recent weight gain or loss No  polyuria, polydypsia or polyphagia,  *A full 10 point Review of Systems was done, except as stated above, all other Review of Systems were negative.  Social History History  Substance Use Topics  . Smoking status: Current Every Day Smoker -- 1.00 packs/day for 18 years    Types: Cigarettes  . Smokeless tobacco: Never Used  . Alcohol Use: No    Resides at: Private residence  Lives with: Alone  Ambulatory status: Without assistive devices   Family History Family History  Problem Relation Age of Onset  . Multiple sclerosis Mother   . COPD Father   . Alcohol abuse Maternal Grandfather   . Alcohol abuse Brother   . Cirrhosis Brother      Prior to Admission medications   Medication Sig Start Date End Date Taking? Authorizing Provider  clonazePAM (KLONOPIN) 1 MG tablet Take 1 mg by mouth 3 (three) times daily.   Yes Historical Provider, MD  COMBIVENT RESPIMAT 20-100 MCG/ACT AERS respimat Inhale 1-2 puffs into the lungs every 6 (six) hours as needed. 10/15/14  Yes Historical Provider, MD  ondansetron (ZOFRAN) 8 MG tablet Take 8 mg by mouth every 6 (six) hours as needed for vomiting.  01/31/14  Yes Historical Provider, MD  pregabalin (LYRICA) 300 MG capsule Take 1 capsule (300 mg total) by mouth at bedtime. 02/12/14  Yes Christiane Ha, MD  QUEtiapine (SEROQUEL) 100 MG tablet Take 100 mg by mouth at bedtime.   Yes Historical Provider, MD  ibuprofen (ADVIL,MOTRIN) 800 MG tablet Take 1 tablet (800 mg total) by mouth 3 (three) times daily. Patient not taking: Reported on 11/18/2014 07/24/14   Terri Piedra, PA-C  traZODone (DESYREL) 150 MG tablet Take 0.5 tablets (75 mg total) by mouth at bedtime as needed for sleep. Patient not taking: Reported on 01/07/2015 02/12/14   Christiane Ha, MD    Allergies  Allergen Reactions  . Minocycline Nausea And Vomiting    Physical Exam  Vitals  Blood pressure 107/70, pulse 100, temperature 99.4 F (37.4 C), temperature source Oral,  resp. rate 12, height 5\' 3"  (1.6 m), weight 113 lb (51.256 kg), last menstrual period 12/24/2014, SpO2 100 %.   General:  In no acute distress, appears mildly toxic and acutely ill  Psych:  Normal affect, Denies Suicidal or Homicidal ideations, Awake Alert, Oriented X 3. Speech and thought patterns are clear and appropriate, no  apparent short term memory deficits  Neuro:   No focal neurological deficits, CN II through XII intact, Strength 5/5 all 4 extremities, Sensation intact all 4 extremities.  ENT:  Ears and Eyes appear Normal, Conjunctivae clear, PER. Moist oral mucosa without erythema or exudates.  Neck:  Supple, No lymphadenopathy appreciated  Respiratory:  Symmetrical chest wall movement, Good air movement bilaterally, paroxysmal coughing reproduced with deep breath. Scattered inspiratory crackles , 2 liters oxygen  Cardiac:  RRR, No Murmurs, no LE edema noted, no JVD, No carotid bruits, peripheral pulses palpable at 2+  Abdomen:  Positive bowel sounds, Soft, Non tender, Non distended,  No masses appreciated, no obvious hepatosplenomegaly  Skin:  No Cyanosis, Normal Skin Turgor, No Skin Rash or Bruise.  Extremities: Symmetrical without obvious trauma or injury,  no effusions.  Data Review  CBC  Recent Labs Lab 01/07/15 1032  WBC 9.3  HGB 13.8  HCT 41.9  PLT 258  MCV 95.4  MCH 31.4  MCHC 32.9  RDW 14.7  LYMPHSABS 1.4  MONOABS 0.4  EOSABS 0.2  BASOSABS 0.0    Chemistries   Recent Labs Lab 01/07/15 1032  NA 137  K 4.0  CL 104  CO2 23  GLUCOSE 107*  BUN 5*  CREATININE 0.81  CALCIUM 8.9    estimated creatinine clearance is 71 mL/min (by C-G formula based on Cr of 0.81).  No results for input(s): TSH, T4TOTAL, T3FREE, THYROIDAB in the last 72 hours.  Invalid input(s): FREET3  Coagulation profile No results for input(s): INR, PROTIME in the last 168 hours.  No results for input(s): DDIMER in the last 72 hours.  Cardiac Enzymes No results for  input(s): CKMB, TROPONINI, MYOGLOBIN in the last 168 hours.  Invalid input(s): CK  Invalid input(s): POCBNP  Urinalysis    Component Value Date/Time   COLORURINE YELLOW 07/23/2014 2028   APPEARANCEUR CLEAR 07/23/2014 2028   LABSPEC 1.015 07/23/2014 2028   PHURINE 7.5 07/23/2014 2028   GLUCOSEU NEGATIVE 07/23/2014 2028   HGBUR MODERATE* 07/23/2014 2028   BILIRUBINUR NEGATIVE 07/23/2014 2028   KETONESUR 40* 07/23/2014 2028   PROTEINUR NEGATIVE 07/23/2014 2028   UROBILINOGEN 1.0 07/23/2014 2028   NITRITE NEGATIVE 07/23/2014 2028   LEUKOCYTESUR MODERATE* 07/23/2014 2028    Imaging results:   Dg Chest 2 View  01/07/2015   CLINICAL DATA:  Intermittent chest pain with nonproductive cough. Wheezing.  EXAM: CHEST  2 VIEW  COMPARISON:  Chest radiographs February 05, 2014 and February 07, 2014 and February 08, 2014 chest CT  FINDINGS: Lungs are clear. Heart size and pulmonary vascularity are normal. No adenopathy. No pneumothorax. There is stable anterior wedging at the L1 vertebral body with localized levoscoliosis at T12-L1.  IMPRESSION: No edema or consolidation. Anterior wedging of the L1 vertebral body would thoracolumbar levoscoliosis at this site, stable.   Electronically Signed   By: Bretta Bang III M.D.   On: 01/07/2015 10:17   Ct Angio Chest Pe W/cm &/or Wo Cm  01/07/2015   CLINICAL DATA:  Midline chest pain, nonproductive cough and fever for 3 days.  EXAM: CT ANGIOGRAPHY CHEST WITH CONTRAST  TECHNIQUE: Multidetector CT imaging of the chest was performed using the standard protocol during bolus administration of intravenous contrast. Multiplanar CT image reconstructions and MIPs were obtained to evaluate the vascular anatomy.  CONTRAST:  80mL OMNIPAQUE IOHEXOL 350 MG/ML SOLN  COMPARISON:  Chest CT 02/08/2014  FINDINGS: Chest wall: No breast masses, supraclavicular or axillary lymphadenopathy. The thyroid gland appears normal.  The bony thorax is intact.  Mediastinum: The heart is normal in  size. No pericardial effusion. The aorta is normal in caliber. No dissection. The pulmonary arterial tree is well opacified. No filling defects to suggest pulmonary embolism. The esophagus is grossly normal. There are scattered mediastinal lymph nodes and borderline enlarged bilateral hilar lymph nodes.  Lungs/pleura: There is fairly extensive peribronchial thickening suggesting severe bronchitis. There are also patchy areas of ground-glass opacity mainly in the upper lobes which could be secondary alveolitis or early bronchopneumonia. No pulmonary edema or pleural effusions. No worrisome pulmonary lesions.  Upper abdomen:  No significant findings.  Review of the MIP images confirms the above findings.  IMPRESSION: 1. No CT findings for pulmonary embolism. 2. Normal thoracic aorta. 3. Severe bronchitis and patchy ground-glass opacities likely representing alveolitis or early bronchopneumonia.   Electronically Signed   By: Rudie MeyerP.  Gallerani M.D.   On: 01/07/2015 14:46     EKG: (Independently reviewed) on his tachycardia with nonspecific T-wave changes nothing concerning for ischemia   Assessment & Plan  Principal Problem:   Acute respiratory failure with hypoxia:  a) Acute asthmatic bronchitis  b) CAP  -Admit to medical floor -Initiate community acquired pneumonia orders to include sputum culture, HIV, urinary legionella and strep -Continue empiric Rocephin and Zithromax -Hycodan and Tessalon Perles for coughing -DuoNeb when necessary currently not wheezing and no indication for IV steroids -Flutter valve  Active Problems:   Chronic low back pain -Continue preadmission medications -Avoid IV narcotics    Dehydration, mild -IV fluid -Follow labs    Bipolar disorder -Continue preadmission Seroquel-QTC normal on EKG -Continue Klonopin    Tobacco abuse -Provided smoking cessation counseling    DVT Prophylaxis: Lovenox  Family Communication: No family at bedside     Code Status:  Full  code  Condition:  Stable  Discharge disposition: Anticipate discharge back to home once pneumonia symptoms improved and patient not hypoxemic  Time spent in minutes : 60      Perian Tedder L. ANP on 01/07/2015 at 3:49 PM  Between 7am to 7pm - Pager - [phone]  After 7pm go to [email or web address] - password [email or web address]  And look for the night coverage person covering me after hours  [facility]

## 2015-01-07 NOTE — ED Notes (Signed)
During assessment O2 desat to 89% on RA. Pt placed on 2L via [place] and O2 is at 94%.

## 2015-01-07 NOTE — ED Notes (Signed)
[facility]. Pt states she has been having a nonproductive cough with intermittent cp. Upon arrival pt O2 sat was 88% on RA with wheezing bilaterally. Pt received 2 albuterol tx, 1 duoneb, 125 of solumedrol and 4 of zofran en route. EMS reports wheezing has improved.

## 2015-01-08 DIAGNOSIS — J9601 Acute respiratory failure with hypoxia: Secondary | ICD-10-CM

## 2015-01-08 LAB — HIV ANTIBODY (ROUTINE TESTING W REFLEX): HIV SCREEN 4TH GENERATION: NONREACTIVE

## 2015-01-08 MED ORDER — HYDROCODONE-ACETAMINOPHEN 5-325 MG PO TABS
1.0000 | ORAL_TABLET | Freq: Four times a day (QID) | ORAL | Status: DC | PRN
  Administered 2015-01-08 (×2): 1 via ORAL
  Filled 2015-01-08 (×2): qty 1

## 2015-01-08 NOTE — Progress Notes (Signed)
Called to patient's room.  Pt stated "I can't stay here anymore.  I have to go."  Reassured pt that she will be discharged tomorrow morning and encouraged pt to stay.  Pt unreasonable.  Informed Dr. Thedore MinsSingh.  AMA paperwork completed.  PIV removed.  Educated pt about the importance of following up with primary MD or urgent care and provided pneumonia education.  Awaiting for patient's husband.  Will continue to monitor.

## 2015-01-08 NOTE — Progress Notes (Signed)
Patient Demographics:    Rachael Ramirez, is a 44 y.o. female, DOB - 1970-12-19, ZOX:[id number]  Admit date - 01/07/2015   Admitting Physician Christiane Ha, MD  Outpatient Primary MD for the patient is [name], MD  LOS - 1   Chief Complaint  Patient presents with  . Shortness of Breath  . Chest Pain        Subjective:    [name] today has, No headache, No chest pain, No abdominal pain - No Nausea, No new weakness tingling or numbness, No Cough - SOB.     Assessment  & Plan :     1.CAP - much improved on break and diabetics which will be continued, supportive care with oxygen Neb treatments. Monitor cultures.   2. Smoking, alcohol abuse, history of heroin abuse. Counseled to quit all.   3. Bipolar disorder and chronic back pain. Supportive care. Not suicidal homicidal, wanted to leave AMA counseled to stay. Continue home regimen of Seroquel and Klonopin. Minimize narcotic use.    Code Status : Full  Family Communication  : none  Disposition Plan  : Home 1-2 days  Consults  :  None  Procedures  : CT chest showing bronchopneumonia  DVT Prophylaxis  :  Lovenox   Lab Results  Component Value Date   PLT 258 01/07/2015    Inpatient Medications  Scheduled Meds: . azithromycin  500 mg Oral Q24H  . benzonatate  200 mg Oral TID  . cefTRIAXone (ROCEPHIN)  IV  1 g Intravenous Q24H  . clonazePAM  1 mg Oral TID  . enoxaparin (LOVENOX) injection  40 mg Subcutaneous Q24H  . ipratropium-albuterol  3 mL Nebulization Q6H  . pregabalin  300 mg Oral QHS  . QUEtiapine  100 mg Oral QHS   Continuous Infusions: . sodium chloride 100 mL/hr at 01/08/15 0551  . albuterol Stopped (01/07/15 1335)   PRN Meds:.HYDROcodone-acetaminophen, HYDROcodone-homatropine, ondansetron  (ZOFRAN) IV, promethazine  Antibiotics  :     Anti-infectives    Start     Dose/Rate Route Frequency Ordered Stop   01/08/15 1600  cefTRIAXone (ROCEPHIN) 1 g in dextrose 5 % 50 mL IVPB - Premix     1 g 100 mL/hr over 30 Minutes Intravenous Every 24 hours 01/07/15 1711 01/15/15 1559   01/08/15 1500  azithromycin (ZITHROMAX) tablet 500 mg     500 mg Oral Every 24 hours 01/07/15 1707 01/15/15 1459   01/07/15 1730  azithromycin (ZITHROMAX) 500 mg in dextrose 5 % 250 mL IVPB     500 mg 250 mL/hr over 60 Minutes Intravenous  Once 01/07/15 1712 01/07/15 1903   01/07/15 1715  cefTRIAXone (ROCEPHIN) 1 g in dextrose 5 % 50 mL IVPB - Premix  Status:  Discontinued     1 g 100 mL/hr over 30 Minutes Intravenous Every 24 hours 01/07/15 1707 01/07/15 1711   01/07/15 1500  cefTRIAXone (ROCEPHIN) 1 g in dextrose 5 % 50 mL IVPB     1 g 100 mL/hr over 30 Minutes Intravenous  Once 01/07/15 1459 01/07/15 1624   01/07/15 1500  azithromycin (ZITHROMAX) 500 mg in dextrose 5 % 250 mL IVPB  Status:  Discontinued     500 mg 250 mL/hr over 60 Minutes  Intravenous  Once 01/07/15 1459 01/07/15 1712        Objective:   Filed Vitals:   01/08/15 0157 01/08/15 0615 01/08/15 0846 01/08/15 1316  BP:  123/82    Pulse: 88 88    Temp:  97.9 F (36.6 C)    TempSrc:  Oral    Resp: 18 14    Height:      Weight:      SpO2: 97% 96% 96% 98%    Wt Readings from Last 3 Encounters:  01/07/15 46.403 kg (102 lb 4.8 oz)  11/08/14 48.535 kg (107 lb)  02/12/14 45.4 kg (100 lb 1.4 oz)     Intake/Output Summary (Last 24 hours) at 01/08/15 1317 Last data filed at 01/08/15 0900  Gross per 24 hour  Intake   1100 ml  Output   1000 ml  Net    100 ml     Physical Exam  Awake Alert, Oriented X 3, No new F.N deficits, Normal affect [place].AT,PERRAL Supple Neck,No JVD, No cervical lymphadenopathy appriciated.  Symmetrical Chest wall movement, Good air movement bilaterally, CTAB RRR,No Gallops,Rubs or new Murmurs, No  Parasternal Heave +ve B.Sounds, Abd Soft, No tenderness, No organomegaly appriciated, No rebound - guarding or rigidity. No Cyanosis, Clubbing or edema, No new Rash or bruise       Data Review:   Micro Results Recent Results (from the past 240 hour(s))  MRSA PCR Screening     Status: None   Collection Time: 01/07/15  5:09 PM  Result Value Ref Range Status   MRSA by PCR NEGATIVE NEGATIVE Final    Comment:        The GeneXpert MRSA Assay (FDA approved for NASAL specimens only), is one component of a comprehensive MRSA colonization surveillance program. It is not intended to diagnose MRSA infection nor to guide or monitor treatment for MRSA infections.   Culture, blood (routine x 2) Call MD if unable to obtain prior to antibiotics being given     Status: None (Preliminary result)   Collection Time: 01/07/15  7:24 PM  Result Value Ref Range Status   Specimen Description BLOOD RIGHT ARM  Final   Special Requests BOTTLES DRAWN AEROBIC ONLY 5CC  Final   Culture NO GROWTH < 24 HOURS  Final   Report Status PENDING  Incomplete  Culture, blood (routine x 2) Call MD if unable to obtain prior to antibiotics being given     Status: None (Preliminary result)   Collection Time: 01/07/15  7:31 PM  Result Value Ref Range Status   Specimen Description BLOOD LEFT HAND  Final   Special Requests BOTTLES DRAWN AEROBIC ONLY 4CC  Final   Culture NO GROWTH < 24 HOURS  Final   Report Status PENDING  Incomplete    Radiology Reports Dg Chest 2 View  01/07/2015   CLINICAL DATA:  Intermittent chest pain with nonproductive cough. Wheezing.  EXAM: CHEST  2 VIEW  COMPARISON:  Chest radiographs February 05, 2014 and February 07, 2014 and February 08, 2014 chest CT  FINDINGS: Lungs are clear. Heart size and pulmonary vascularity are normal. No adenopathy. No pneumothorax. There is stable anterior wedging at the L1 vertebral body with localized levoscoliosis at T12-L1.  IMPRESSION: No edema or consolidation. Anterior  wedging of the L1 vertebral body would thoracolumbar levoscoliosis at this site, stable.   Electronically Signed   By: Bretta Bang III M.D.   On: 01/07/2015 10:17   Ct Angio Chest Pe W/cm &/or Wo  Cm  01/07/2015   CLINICAL DATA:  Midline chest pain, nonproductive cough and fever for 3 days.  EXAM: CT ANGIOGRAPHY CHEST WITH CONTRAST  TECHNIQUE: Multidetector CT imaging of the chest was performed using the standard protocol during bolus administration of intravenous contrast. Multiplanar CT image reconstructions and MIPs were obtained to evaluate the vascular anatomy.  CONTRAST:  80mL OMNIPAQUE IOHEXOL 350 MG/ML SOLN  COMPARISON:  Chest CT 02/08/2014  FINDINGS: Chest wall: No breast masses, supraclavicular or axillary lymphadenopathy. The thyroid gland appears normal. The bony thorax is intact.  Mediastinum: The heart is normal in size. No pericardial effusion. The aorta is normal in caliber. No dissection. The pulmonary arterial tree is well opacified. No filling defects to suggest pulmonary embolism. The esophagus is grossly normal. There are scattered mediastinal lymph nodes and borderline enlarged bilateral hilar lymph nodes.  Lungs/pleura: There is fairly extensive peribronchial thickening suggesting severe bronchitis. There are also patchy areas of ground-glass opacity mainly in the upper lobes which could be secondary alveolitis or early bronchopneumonia. No pulmonary edema or pleural effusions. No worrisome pulmonary lesions.  Upper abdomen:  No significant findings.  Review of the MIP images confirms the above findings.  IMPRESSION: 1. No CT findings for pulmonary embolism. 2. Normal thoracic aorta. 3. Severe bronchitis and patchy ground-glass opacities likely representing alveolitis or early bronchopneumonia.   Electronically Signed   By: Rudie MeyerP.  Gallerani M.D.   On: 01/07/2015 14:46     CBC  Recent Labs Lab 01/07/15 1032  WBC 9.3  HGB 13.8  HCT 41.9  PLT 258  MCV 95.4  MCH 31.4  MCHC 32.9    RDW 14.7  LYMPHSABS 1.4  MONOABS 0.4  EOSABS 0.2  BASOSABS 0.0    Chemistries   Recent Labs Lab 01/07/15 1032  NA 137  K 4.0  CL 104  CO2 23  GLUCOSE 107*  BUN 5*  CREATININE 0.81  CALCIUM 8.9   ------------------------------------------------------------------------------------------------------------------ estimated creatinine clearance is 64.2 mL/min (by C-G formula based on Cr of 0.81). ------------------------------------------------------------------------------------------------------------------ No results for input(s): HGBA1C in the last 72 hours. ------------------------------------------------------------------------------------------------------------------ No results for input(s): CHOL, HDL, LDLCALC, TRIG, CHOLHDL, LDLDIRECT in the last 72 hours. ------------------------------------------------------------------------------------------------------------------ No results for input(s): TSH, T4TOTAL, T3FREE, THYROIDAB in the last 72 hours.  Invalid input(s): FREET3 ------------------------------------------------------------------------------------------------------------------ No results for input(s): VITAMINB12, FOLATE, FERRITIN, TIBC, IRON, RETICCTPCT in the last 72 hours.  Coagulation profile No results for input(s): INR, PROTIME in the last 168 hours.  No results for input(s): DDIMER in the last 72 hours.  Cardiac Enzymes No results for input(s): CKMB, TROPONINI, MYOGLOBIN in the last 168 hours.  Invalid input(s): CK ------------------------------------------------------------------------------------------------------------------ Invalid input(s): POCBNP   Time Spent in minutes   35   Eagan Shifflett K M.D on 01/08/2015 at 1:17 PM  Between 7am to 7pm - Pager - [phone]  After 7pm go to [email or web address] - password [email or web address]  [facility]   Office  [phone]

## 2015-01-08 NOTE — Discharge Summary (Signed)
AMA  Patient at this time expresses desire to leave the Hospital immidiately, patient has been warned that this is not Medically advisable at this time, and can result in Medical complications like Death and Disability, patient understands and accepts the risks involved and assumes full responsibilty of this decision.   Rachael Ramirez,Rachael Ramirez on 01/08/2015 at 4:31 PM  [facility]    Last Note Below                                                                                                                      Patient Demographics:    Rachael FreezeRachel Ramirez, is a 44 y.o. female, DOB - 03/15/1971, ZOX:[id number]  Admit date - 01/07/2015   Admitting Physician Rachael Haorinna L Sullivan, MD  Outpatient Primary MD for the patient is Rachael Ramirez,WILSON OLIVER, MD  LOS - 1   Chief Complaint  Patient presents with  . Shortness of Breath  . Chest Pain        Subjective:    Rachael Ramirez today has, No headache, No chest pain, No abdominal pain - No Nausea, No new weakness tingling or numbness, No Cough - SOB.     Assessment  & Plan :     1.CAP - much improved on break and diabetics which will be continued, supportive care with oxygen Neb treatments. Monitor cultures.   2. Smoking, alcohol abuse, history of heroin abuse. Counseled to quit all.   3. Bipolar disorder and chronic back pain. Supportive care. Not suicidal homicidal, wanted to leave AMA counseled to stay. Continue home regimen of Seroquel and Klonopin. Minimize narcotic use.    Code Status : Full  Family Communication  : none  Disposition Plan  : Home 1-2 days  Consults  :  None  Procedures  : CT chest showing bronchopneumonia  DVT Prophylaxis  :  Lovenox   Lab Results  Component Value Date   PLT 258  01/07/2015    Inpatient Medications  Scheduled Meds: . azithromycin  500 mg Oral Q24H  . benzonatate  200 mg Oral TID  . cefTRIAXone (ROCEPHIN)  IV  1 g Intravenous Q24H  . clonazePAM  1 mg Oral TID  . enoxaparin (  LOVENOX) injection  40 mg Subcutaneous Q24H  . ipratropium-albuterol  3 mL Nebulization Q6H  . pregabalin  300 mg Oral QHS  . QUEtiapine  100 mg Oral QHS   Continuous Infusions: . sodium chloride 100 mL/hr at 01/08/15 0551  . albuterol Stopped (01/07/15 1335)   PRN Meds:.HYDROcodone-acetaminophen, HYDROcodone-homatropine, ondansetron (ZOFRAN) IV, promethazine  Antibiotics  :     Anti-infectives    Start     Dose/Rate Route Frequency Ordered Stop   01/08/15 1600  cefTRIAXone (ROCEPHIN) 1 g in dextrose 5 % 50 mL IVPB - Premix     1 g 100 mL/hr over 30 Minutes Intravenous Every 24 hours 01/07/15 1711 01/15/15 1559   01/08/15 1500  azithromycin (ZITHROMAX) tablet 500 mg     500 mg Oral Every 24 hours 01/07/15 1707 01/15/15 1459   01/07/15 1730  azithromycin (ZITHROMAX) 500 mg in dextrose 5 % 250 mL IVPB     500 mg 250 mL/hr over 60 Minutes Intravenous  Once 01/07/15 1712 01/07/15 1903   01/07/15 1715  cefTRIAXone (ROCEPHIN) 1 g in dextrose 5 % 50 mL IVPB - Premix  Status:  Discontinued     1 g 100 mL/hr over 30 Minutes Intravenous Every 24 hours 01/07/15 1707 01/07/15 1711   01/07/15 1500  cefTRIAXone (ROCEPHIN) 1 g in dextrose 5 % 50 mL IVPB     1 g 100 mL/hr over 30 Minutes Intravenous  Once 01/07/15 1459 01/07/15 1624   01/07/15 1500  azithromycin (ZITHROMAX) 500 mg in dextrose 5 % 250 mL IVPB  Status:  Discontinued     500 mg 250 mL/hr over 60 Minutes Intravenous  Once 01/07/15 1459 01/07/15 1712        Objective:   Filed Vitals:   01/08/15 0615 01/08/15 0846 01/08/15 1316 01/08/15 1541  BP: 123/82   140/79  Pulse: 88   114  Temp: 97.9 F (36.6 C)   98.9 F (37.2 C)  TempSrc: Oral   Oral  Resp: 14   18  Height:      Weight:      SpO2: 96% 96% 98%  100%    Wt Readings from Last 3 Encounters:  01/07/15 46.403 kg (102 lb 4.8 oz)  11/08/14 48.535 kg (107 lb)  02/12/14 45.4 kg (100 lb 1.4 oz)     Intake/Output Summary (Last 24 hours) at 01/08/15 1631 Last data filed at 01/08/15 0900  Gross per 24 hour  Intake   1100 ml  Output   1000 ml  Net    100 ml     Physical Exam  Awake Alert, Oriented X 3, No new F.N deficits, Normal affect [place].AT,PERRAL Supple Neck,No JVD, No cervical lymphadenopathy appriciated.  Symmetrical Chest wall movement, Good air movement bilaterally, CTAB RRR,No Gallops,Rubs or new Murmurs, No Parasternal Heave +ve B.Sounds, Abd Soft, No tenderness, No organomegaly appriciated, No rebound - guarding or rigidity. No Cyanosis, Clubbing or edema, No new Rash or bruise       Data Review:   Micro Results Recent Results (from the past 240 hour(s))  MRSA PCR Screening     Status: None   Collection Time: 01/07/15  5:09 PM  Result Value Ref Range Status   MRSA by PCR NEGATIVE NEGATIVE Final    Comment:        The GeneXpert MRSA Assay (FDA approved for NASAL specimens only), is one component of a comprehensive MRSA colonization surveillance program. It is not intended to diagnose MRSA infection nor to guide  or monitor treatment for MRSA infections.   Culture, blood (routine x 2) Call MD if unable to obtain prior to antibiotics being given     Status: None (Preliminary result)   Collection Time: 01/07/15  7:24 PM  Result Value Ref Range Status   Specimen Description BLOOD RIGHT ARM  Final   Special Requests BOTTLES DRAWN AEROBIC ONLY 5CC  Final   Culture NO GROWTH < 24 HOURS  Final   Report Status PENDING  Incomplete  Culture, blood (routine x 2) Call MD if unable to obtain prior to antibiotics being given     Status: None (Preliminary result)   Collection Time: 01/07/15  7:31 PM  Result Value Ref Range Status   Specimen Description BLOOD LEFT HAND  Final   Special Requests BOTTLES DRAWN AEROBIC  ONLY 4CC  Final   Culture NO GROWTH < 24 HOURS  Final   Report Status PENDING  Incomplete    Radiology Reports Dg Chest 2 View  01/07/2015   CLINICAL DATA:  Intermittent chest pain with nonproductive cough. Wheezing.  EXAM: CHEST  2 VIEW  COMPARISON:  Chest radiographs February 05, 2014 and February 07, 2014 and February 08, 2014 chest CT  FINDINGS: Lungs are clear. Heart size and pulmonary vascularity are normal. No adenopathy. No pneumothorax. There is stable anterior wedging at the L1 vertebral body with localized levoscoliosis at T12-L1.  IMPRESSION: No edema or consolidation. Anterior wedging of the L1 vertebral body would thoracolumbar levoscoliosis at this site, stable.   Electronically Signed   By: Bretta Bang III Ramirez.   On: 01/07/2015 10:17   Ct Angio Chest Pe W/cm &/or Wo Cm  01/07/2015   CLINICAL DATA:  Midline chest pain, nonproductive cough and fever for 3 days.  EXAM: CT ANGIOGRAPHY CHEST WITH CONTRAST  TECHNIQUE: Multidetector CT imaging of the chest was performed using the standard protocol during bolus administration of intravenous contrast. Multiplanar CT image reconstructions and MIPs were obtained to evaluate the vascular anatomy.  CONTRAST:  80mL OMNIPAQUE IOHEXOL 350 MG/ML SOLN  COMPARISON:  Chest CT 02/08/2014  FINDINGS: Chest wall: No breast masses, supraclavicular or axillary lymphadenopathy. The thyroid gland appears normal. The bony thorax is intact.  Mediastinum: The heart is normal in size. No pericardial effusion. The aorta is normal in caliber. No dissection. The pulmonary arterial tree is well opacified. No filling defects to suggest pulmonary embolism. The esophagus is grossly normal. There are scattered mediastinal lymph nodes and borderline enlarged bilateral hilar lymph nodes.  Lungs/pleura: There is fairly extensive peribronchial thickening suggesting severe bronchitis. There are also patchy areas of ground-glass opacity mainly in the upper lobes which could be  secondary alveolitis or early bronchopneumonia. No pulmonary edema or pleural effusions. No worrisome pulmonary lesions.  Upper abdomen:  No significant findings.  Review of the MIP images confirms the above findings.  IMPRESSION: 1. No CT findings for pulmonary embolism. 2. Normal thoracic aorta. 3. Severe bronchitis and patchy ground-glass opacities likely representing alveolitis or early bronchopneumonia.   Electronically Signed   By: Rudie Meyer Ramirez.   On: 01/07/2015 14:46     CBC  Recent Labs Lab 01/07/15 1032  WBC 9.3  HGB 13.8  HCT 41.9  PLT 258  MCV 95.4  MCH 31.4  MCHC 32.9  RDW 14.7  LYMPHSABS 1.4  MONOABS 0.4  EOSABS 0.2  BASOSABS 0.0    Chemistries   Recent Labs Lab 01/07/15 1032  NA 137  K 4.0  CL 104  CO2  23  GLUCOSE 107*  BUN 5*  CREATININE 0.81  CALCIUM 8.9   ------------------------------------------------------------------------------------------------------------------ estimated creatinine clearance is 64.2 mL/min (by C-G formula based on Cr of 0.81). ------------------------------------------------------------------------------------------------------------------ No results for input(s): HGBA1C in the last 72 hours. ------------------------------------------------------------------------------------------------------------------ No results for input(s): CHOL, HDL, LDLCALC, TRIG, CHOLHDL, LDLDIRECT in the last 72 hours. ------------------------------------------------------------------------------------------------------------------ No results for input(s): TSH, T4TOTAL, T3FREE, THYROIDAB in the last 72 hours.  Invalid input(s): FREET3 ------------------------------------------------------------------------------------------------------------------ No results for input(s): VITAMINB12, FOLATE, FERRITIN, TIBC, IRON, RETICCTPCT in the last 72 hours.  Coagulation profile No results for input(s): INR, PROTIME in the last 168 hours.  No results for  input(s): DDIMER in the last 72 hours.  Cardiac Enzymes No results for input(s): CKMB, TROPONINI, MYOGLOBIN in the last 168 hours.  Invalid input(s): CK ------------------------------------------------------------------------------------------------------------------ Invalid input(s): POCBNP   Time Spent in minutes   35   SINGH,Rachael Ramirez on 01/08/2015 at 4:31 PM  Between 7am to 7pm - Pager - [phone]  After 7pm go to [email or web address] - password [facility]  [facility]   Office  [phone]

## 2015-01-08 NOTE — Care Management Note (Signed)
Case Management Note  Patient Details  Name: Rachael Ramirez MRN: [id number] Date of Birth: 02/02/1971  Subjective/Objective:       Patient lives with spouse, pta indep.  NCM will cont to follow for dc needs.             Action/Plan:   Expected Discharge Date:                  Expected Discharge Plan:  Home/Self Care  In-House Referral:     Discharge planning Services  CM Consult  Post Acute Care Choice:    Choice offered to:     DME Arranged:    DME Agency:     HH Arranged:    HH Agency:     Status of Service:  In process, will continue to follow  Medicare Important Message Given:    Date Medicare IM Given:    Medicare IM give by:    Date Additional Medicare IM Given:    Additional Medicare Important Message give by:     If discussed at Long Length of Stay Meetings, dates discussed:    Additional Comments:  Leone Havenaylor, Laurianne Floresca Clinton, RN 01/08/2015, 5:34 PM

## 2015-01-12 LAB — CULTURE, BLOOD (ROUTINE X 2)
CULTURE: NO GROWTH
Culture: NO GROWTH

## 2015-02-11 ENCOUNTER — Emergency Department (HOSPITAL_COMMUNITY)
Admission: EM | Admit: 2015-02-11 | Discharge: 2015-02-11 | Disposition: A | Discharge status: Home / Self Care | Payer: Managed Care, Other (non HMO) | Attending: Emergency Medicine | Admitting: Emergency Medicine

## 2015-02-11 ENCOUNTER — Encounter (HOSPITAL_COMMUNITY): Payer: Self-pay | Admitting: *Deleted

## 2015-02-11 ENCOUNTER — Emergency Department (HOSPITAL_COMMUNITY): Payer: Managed Care, Other (non HMO)

## 2015-02-11 DIAGNOSIS — K219 Gastro-esophageal reflux disease without esophagitis: Secondary | ICD-10-CM | POA: Diagnosis not present

## 2015-02-11 DIAGNOSIS — J45909 Unspecified asthma, uncomplicated: Secondary | ICD-10-CM | POA: Insufficient documentation

## 2015-02-11 DIAGNOSIS — F419 Anxiety disorder, unspecified: Secondary | ICD-10-CM | POA: Diagnosis not present

## 2015-02-11 DIAGNOSIS — Z915 Personal history of self-harm: Secondary | ICD-10-CM | POA: Insufficient documentation

## 2015-02-11 DIAGNOSIS — Z720 Tobacco use: Secondary | ICD-10-CM | POA: Diagnosis not present

## 2015-02-11 DIAGNOSIS — Z79899 Other long term (current) drug therapy: Secondary | ICD-10-CM | POA: Insufficient documentation

## 2015-02-11 DIAGNOSIS — G8929 Other chronic pain: Secondary | ICD-10-CM | POA: Diagnosis not present

## 2015-02-11 DIAGNOSIS — R296 Repeated falls: Secondary | ICD-10-CM | POA: Diagnosis not present

## 2015-02-11 DIAGNOSIS — I10 Essential (primary) hypertension: Secondary | ICD-10-CM | POA: Insufficient documentation

## 2015-02-11 DIAGNOSIS — Z8614 Personal history of Methicillin resistant Staphylococcus aureus infection: Secondary | ICD-10-CM | POA: Insufficient documentation

## 2015-02-11 DIAGNOSIS — F1190 Opioid use, unspecified, uncomplicated: Secondary | ICD-10-CM | POA: Diagnosis not present

## 2015-02-11 DIAGNOSIS — F1390 Sedative, hypnotic, or anxiolytic use, unspecified, uncomplicated: Secondary | ICD-10-CM | POA: Insufficient documentation

## 2015-02-11 DIAGNOSIS — R4182 Altered mental status, unspecified: Secondary | ICD-10-CM

## 2015-02-11 DIAGNOSIS — Z8701 Personal history of pneumonia (recurrent): Secondary | ICD-10-CM | POA: Diagnosis not present

## 2015-02-11 DIAGNOSIS — F319 Bipolar disorder, unspecified: Secondary | ICD-10-CM | POA: Insufficient documentation

## 2015-02-11 DIAGNOSIS — G4700 Insomnia, unspecified: Secondary | ICD-10-CM | POA: Insufficient documentation

## 2015-02-11 DIAGNOSIS — Z91148 Patient's other noncompliance with medication regimen for other reason: Secondary | ICD-10-CM

## 2015-02-11 DIAGNOSIS — M542 Cervicalgia: Secondary | ICD-10-CM | POA: Diagnosis not present

## 2015-02-11 DIAGNOSIS — R400 Somnolence: Secondary | ICD-10-CM | POA: Diagnosis present

## 2015-02-11 DIAGNOSIS — M549 Dorsalgia, unspecified: Secondary | ICD-10-CM | POA: Diagnosis not present

## 2015-02-11 DIAGNOSIS — Z9114 Patient's other noncompliance with medication regimen: Secondary | ICD-10-CM

## 2015-02-11 LAB — BILIRUBIN, DIRECT: Bilirubin, Direct: <0.1 mg/dL — ABNORMAL LOW (ref 0.1–0.5)

## 2015-02-11 LAB — RAPID URINE DRUG SCREEN, HOSP PERFORMED
Amphetamines: NOT DETECTED
Barbiturates: NOT DETECTED
Benzodiazepines: POSITIVE — AB
Cocaine: NOT DETECTED
Opiates: POSITIVE — AB
Tetrahydrocannabinol: NOT DETECTED

## 2015-02-11 LAB — CBC WITH DIFFERENTIAL/PLATELET
Basophils Absolute: 0 K/uL (ref 0.0–0.1)
Basophils Relative: 0 % (ref 0–1)
Eosinophils Absolute: 0.1 K/uL (ref 0.0–0.7)
Eosinophils Relative: 2 % (ref 0–5)
HCT: 40.6 % (ref 36.0–46.0)
Hemoglobin: 13.2 g/dL (ref 12.0–15.0)
Lymphocytes Relative: 32 % (ref 12–46)
Lymphs Abs: 1.9 K/uL (ref 0.7–4.0)
MCH: 31.3 pg (ref 26.0–34.0)
MCHC: 32.5 g/dL (ref 30.0–36.0)
MCV: 96.2 fL (ref 78.0–100.0)
Monocytes Absolute: 0.4 K/uL (ref 0.1–1.0)
Monocytes Relative: 7 % (ref 3–12)
Neutro Abs: 3.6 K/uL (ref 1.7–7.7)
Neutrophils Relative %: 59 % (ref 43–77)
Platelets: 332 K/uL (ref 150–400)
RBC: 4.22 MIL/uL (ref 3.87–5.11)
RDW: 15.9 % — ABNORMAL HIGH (ref 11.5–15.5)
WBC: 6 K/uL (ref 4.0–10.5)

## 2015-02-11 LAB — URINALYSIS, ROUTINE W REFLEX MICROSCOPIC
Bilirubin Urine: NEGATIVE
Glucose, UA: NEGATIVE mg/dL
Hgb urine dipstick: NEGATIVE
Ketones, ur: NEGATIVE mg/dL
Leukocytes, UA: NEGATIVE
Nitrite: NEGATIVE
Protein, ur: NEGATIVE mg/dL
Specific Gravity, Urine: 1.005 (ref 1.005–1.030)
Urobilinogen, UA: 0.2 mg/dL (ref 0.0–1.0)
pH: 6 (ref 5.0–8.0)

## 2015-02-11 LAB — BLOOD GAS, ARTERIAL
Acid-Base Excess: 1.5 mmol/L (ref 0.0–2.0)
Bicarbonate: 25 mEq/L — ABNORMAL HIGH (ref 20.0–24.0)
Drawn by: 103701
FIO2: 0.21
O2 Saturation: 95.4 %
Patient temperature: 98.6
TCO2: 22.2 mmol/L (ref 0–100)
pCO2 arterial: 37.5 mmHg (ref 35.0–45.0)
pH, Arterial: 7.44 (ref 7.350–7.450)
pO2, Arterial: 78.5 mmHg — ABNORMAL LOW (ref 80.0–100.0)

## 2015-02-11 LAB — ETHANOL: Alcohol, Ethyl (B): <5 mg/dL

## 2015-02-11 LAB — LACTIC ACID, PLASMA: Lactic Acid, Venous: 0.9 mmol/L (ref 0.5–2.0)

## 2015-02-11 LAB — COMPREHENSIVE METABOLIC PANEL WITH GFR
ALT: 199 U/L — ABNORMAL HIGH (ref 14–54)
AST: 87 U/L — ABNORMAL HIGH (ref 15–41)
Albumin: 4 g/dL (ref 3.5–5.0)
Alkaline Phosphatase: 136 U/L — ABNORMAL HIGH (ref 38–126)
Anion gap: 8 (ref 5–15)
BUN: 9 mg/dL (ref 6–20)
CO2: 27 mmol/L (ref 22–32)
Calcium: 9.3 mg/dL (ref 8.9–10.3)
Chloride: 105 mmol/L (ref 101–111)
Creatinine, Ser: 0.74 mg/dL (ref 0.44–1.00)
GFR calc Af Amer: >60 mL/min
GFR calc non Af Amer: >60 mL/min
Glucose, Bld: 88 mg/dL (ref 65–99)
Potassium: 4 mmol/L (ref 3.5–5.1)
Sodium: 140 mmol/L (ref 135–145)
Total Bilirubin: 0.7 mg/dL (ref 0.3–1.2)
Total Protein: 7.4 g/dL (ref 6.5–8.1)

## 2015-02-11 LAB — ACETAMINOPHEN LEVEL: Acetaminophen (Tylenol), Serum: <10 ug/mL — ABNORMAL LOW (ref 10–30)

## 2015-02-11 LAB — SALICYLATE LEVEL: Salicylate Lvl: <4 mg/dL (ref 2.8–30.0)

## 2015-02-11 LAB — AMMONIA: Ammonia: 25 umol/L (ref 9–35)

## 2015-02-11 MED ORDER — NALOXONE HCL 1 MG/ML IJ SOLN
1.0000 mg | Freq: Once | INTRAMUSCULAR | Status: AC
  Administered 2015-02-11: 1 mg via INTRAMUSCULAR
  Filled 2015-02-11: qty 2

## 2015-02-11 NOTE — ED Notes (Signed)
Per [facility] [phone]. Pt called EMS with c/o recurrent neck, back pain. Pain shooting down l/leg. Pt is alert, oriented and cooperative, pt ambulated at scene. Hx of frequent falls. Denies head pain

## 2015-02-11 NOTE — ED Notes (Addendum)
Pt states she is having severe left sided neck pain for the past week. Pt denies injury, states she woke up with pain. Pt also states she falls frequently due to her knees "giving out". Pt requesting MRI.

## 2015-02-11 NOTE — ED Provider Notes (Signed)
Medical screening examination/treatment/procedure(s) were performed by non-physician practitioner and as supervising physician I was immediately available for consultation/collaboration.   EKG Interpretation   Date/Time:  Tuesday February 11 2015 15:13:25 EDT Ventricular Rate:  79 PR Interval:  140 QRS Duration: 87 QT Interval:  394 QTC Calculation: 452 R Axis:   71 Text Interpretation:  Sinus rhythm RSR' in V1 or V2, probably normal  variant Confirmed by Vinie Charity  MD, Ahren Pettinger ([id number]) on 02/11/2015 3:46:35 PM     43yF with neck pain and altered mental status. Pt apparently alert, oriented and cooperative initially on presenting to the ED. On my exam she is extremely drowsy. Speech slurred to point of being very difficult to understand and questions having to be asked repeatedly. C/o neck pain. I really can't get much history from her beyond this.  Apparently has a hx of chronic neck/back pain and frequent falls per review of history. Pupils constricted. Given narcan with pupillary response but otherwise no improvement in mental status. CT head w/o acute findings. Intervals ok on EKG. Labs fairly unremarkable at this point aside from elevation in LFTs. Afebrile. Neck is supple. Some additional history obtained from husband, Sanaz Scarlett [phone]). Pt seemed like normal self this morning. She went over to mother's later in day and then called him saying she wasn't feeling well and was going to come to hospital. She apparently told him that her mother had given her two hydrocodone to take for her pain. Her speech sounded slurred to him during this conversation. Has fallen previously, but no recent trauma that he is aware of. Will additionally check acetaminophen/salicylate/lamictal levels and an ABG. She has previously been evaluated for overdose. My clinically impression at this point is that she is likely over medicated.   Now more alert although still somewhat drowsy. Would like to go home. Able to  tell me she is in the ER, give correct date/day of week, and otherwise answer questions appropriately. Will discharge if husband can pick her up or continue to observe her in the ED until she can ambulate unassisted.   Raeford Razor, MD 02/11/15 [phone]

## 2015-02-11 NOTE — Discharge Instructions (Signed)
Altered Mental Status Altered mental status most often refers to an abnormal change in your responsiveness and awareness. It can affect your speech, thought, mobility, memory, attention span, or alertness. It can range from slight confusion to complete unresponsiveness (coma). Altered mental status can be a sign of a serious underlying medical condition. Rapid evaluation and medical treatment is necessary for patients having an altered mental status. CAUSES   Low blood sugar (hypoglycemia) or diabetes.  Severe loss of body fluids (dehydration) or a body salt (electrolyte) imbalance.  A stroke or other neurologic problem, such as dementia or delirium.  A head injury or tumor.  A drug or alcohol overdose.  Exposure to toxins or poisons.  Depression, anxiety, and stress.  A low oxygen level (hypoxia).  An infection.  Blood loss.  Twitching or shaking (seizure).  Heart problems, such as heart attack or heart rhythm problems (arrhythmias).  A body temperature that is too low or too high (hypothermia or hyperthermia). DIAGNOSIS  A diagnosis is based on your history, symptoms, physical and neurologic examinations, and diagnostic tests. Diagnostic tests may include:  Measurement of your blood pressure, pulse, breathing, and oxygen levels (vital signs).  Blood tests.  Urine tests.  X-ray exams.  A computerized magnetic scan (magnetic resonance imaging, MRI).  A computerized X-ray scan (computed tomography, CT scan). TREATMENT  Treatment will depend on the cause. Treatment may include:  Management of an underlying medical or mental health condition.  Critical care or support in the hospital. [place]   Only take over-the-counter or prescription medicines for pain, discomfort, or fever as directed by your caregiver.  Manage underlying conditions as directed by your caregiver.  Eat a healthy, well-balanced diet to maintain strength.  Join a support group or  prevention program to cope with the condition or trauma that caused the altered mental status. Ask your caregiver to help choose a program that works for you.  Follow up with your caregiver for further examination, therapy, or testing as directed. SEEK MEDICAL CARE IF:   You feel unwell or have chills.  You or your family notice a change in your behavior or your alertness.  You have trouble following your caregiver's treatment plan.  You have questions or concerns. SEEK IMMEDIATE MEDICAL CARE IF:   You have a rapid heartbeat or have chest pain.  You have difficulty breathing.  You have a fever.  You have a headache with a stiff neck.  You cough up blood.  You have blood in your urine or stool.  You have severe agitation or confusion. MAKE SURE YOU:   Understand these instructions.  Will watch your condition.  Will get help right away if you are not doing well or get worse. Document Released: 12/02/2009 Document Revised: 09/06/2011 Document Reviewed: 12/02/2009 [facility] Patient Information 2015 [place], [place]. This information is not intended to replace advice given to you by your health care provider. Make sure you discuss any questions you have with your health care provider.  Confusion Confusion is the inability to think with your usual speed or clarity. Confusion may come on quickly or slowly over time. How quickly the confusion comes on depends on the cause. Confusion can be due to any number of causes. CAUSES   Concussion, head injury, or head trauma.  Seizures.  Stroke.  Fever.  Brain tumor.  Age related decreased brain function (dementia).  Heightened emotional states like rage or terror.  Mental illness in which the person loses the ability to determine  what is real and what is not (hallucinations). °· Infections such as a urinary tract infection (UTI). °· Toxic effects from alcohol, drugs, or prescription medicines. °· Dehydration and an imbalance of  salts in the body (electrolytes). °· Lack of sleep. °· Low blood sugar (diabetes). °· Low levels of oxygen from conditions such as chronic lung disorders. °· Drug interactions or other medicine side effects. °· Nutritional deficiencies, especially niacin, thiamine, vitamin C, or vitamin B. °· Sudden drop in body temperature (hypothermia). °· Change in routine, such as when traveling or hospitalized. °SIGNS AND SYMPTOMS  °People often describe their thinking as cloudy or unclear when they are confused. Confusion can also include feeling disoriented. That means you are unaware of where or who you are. You may also not know what the date or time is. If confused, you may also have difficulty paying attention, remembering, and making decisions. Some people also act aggressively when they are confused.  °DIAGNOSIS  °The medical evaluation of confusion may include: °· Blood and urine tests. °· X-rays. °· Brain and nervous system tests. °· Analyzing your brain waves (electroencephalogram or EEG). °· Magnetic resonance imaging (MRI) of your head. °· Computed tomography (CT) scan of your head. °· Mental status tests in which your health care provider may ask many questions. Some of these questions may seem silly or strange, but they are a very important test to help diagnose and treat confusion. °TREATMENT  °An admission to the hospital may not be needed, but a person with confusion should not be left alone. Stay with a family member or friend until the confusion clears. Avoid alcohol, pain relievers, or sedative drugs until you have fully recovered. Do not drive until directed by your health care provider. °HOME CARE INSTRUCTIONS  °What family and friends can do: °· To find out if someone is confused, ask the person to state his or her name, age, and the date. If the person is unsure or answers incorrectly, he or she is confused. °· Always introduce yourself, no matter how well the person knows you. °· Often remind the  person of his or her location. °· Place a calendar and clock near the confused person. °· Help the person with his or her medicines. You may want to use a pill box, an alarm as a reminder, or give the person each dose as prescribed. °· Talk about current events and plans for the day. °· Try to keep the environment calm, quiet, and peaceful. °· Make sure the person keeps follow-up visits with his or her health care provider. °PREVENTION  °Ways to prevent confusion: °· Avoid alcohol. °· Eat a balanced diet. °· Get enough sleep. °· Take medicine only as directed by your health care provider. °· Do not become isolated. Spend time with other people and make plans for your days. °· Keep careful watch on your blood sugar levels if you are diabetic. °SEEK IMMEDIATE MEDICAL CARE IF:  °· You develop severe headaches, repeated vomiting, seizures, blackouts, or slurred speech. °· There is increasing confusion, weakness, numbness, restlessness, or personality changes. °· You develop a loss of balance, have marked dizziness, feel uncoordinated, or fall. °· You have delusions, hallucinations, or develop severe anxiety. °· Your family members think you need to be rechecked. °Document Released: 07/22/2004 Document Revised: 10/29/2013 Document Reviewed: 07/20/2013 °[employer]® Patient Information ©2015 [employer]. This information is not intended to replace advice given to you by your health care provider. Make sure you discuss any questions you have   with your health care provider.  Take at home prescription ONLY AS PRESCRIBED BY CLINICAL PROVIDER.

## 2015-02-11 NOTE — ED Notes (Signed)
Pt appears drowsy  She reports having several falls over the last month. Last fall was last night x4.  She denies sob, cp, h/a and or dizziness. Her  main complain today is neck and back pain 10/10. Pt reports taking Lyrica this am.

## 2015-02-11 NOTE — ED Notes (Addendum)
Call received from Tammy, RT  Blood Gas Arterial  PH                  7.44 PCO2             37.5 PO2                78.5 Bicarb              25 Base excess   1.5 HGB                 13 O2 sat on RA  95.4

## 2015-02-11 NOTE — ED Notes (Signed)
Phone # [phone] Trey Paula , non working. Call placed pts house phone , spouse is on his way in 30 min

## 2015-02-11 NOTE — ED Provider Notes (Signed)
CSN: [id number]     Arrival date & time 02/11/15  1110 History   First MD Initiated Contact with Patient 02/11/15 1242     Chief Complaint  Patient presents with  . Fall  . Neck Pain  . Back Pain    chronic     (Consider location/radiation/quality/duration/timing/severity/associated sxs/prior Treatment) HPI Comments: Rajni Holsworth is a 44 y/o F who came to [facility] via ambulance due to increase drowsiness, increase falls over the last 2 weeks, and chronic neck and back pain uncontrolled. Pt is unaccompanied. Pt is overtly drowsy and difficult to arouse during interview. Pt states that she has chronic pain in her neck and back that she takes vicodin for. Pt states that she took klonopin, seroquel, and lyrica today. Pt cannot recall if she took anything else. Denies alcohol or recreational drug use. Pt does not know why she is so sleepy and that she is not normally like this. Pt denies numbness or tingling in her legs. Denies bowel or bladder incontinence. Denies nausea, vomiting, or diarrhea. Denies SI, HI. Pt has hx of overdose and suicide attempt.   Of note pt was admitted to the hospital 1 month ago for pneumonia for which she left AMA. Pt states that she "does not want any more surgeries".   The history is provided by the patient. The history is limited by the condition of the patient.    Past Medical History  Diagnosis Date  . Back pain   . Asthma     uses Combivent daily as needed  . Insomnia     takes Trazodone nightly  . Anxiety     takes Klonopin daily as needed  . PONV (postoperative nausea and vomiting)   . Cough   . Spinal headache   . Weakness     left leg  . GERD (gastroesophageal reflux disease)     takes Omeprazole daily  . Urinary urgency   . Bipolar 1 disorder     takes Lamictal daily  . Chronic low back pain     HNP and Radiculopathy  . MRSA (methicillin resistant Staphylococcus aureus)     Postive nasal swab  . Hypertension   . Collapsed lung 3/15    "while in hospital awaiting back OR"  . CAP (community acquired pneumonia) 01/07/2015   Past Surgical History  Procedure Laterality Date  . Percutaneous pinning Right 1992    "hand"  . Anterior cervical decomp/discectomy fusion  1996    "took bone off her hip"  . Elbow fracture surgery Left 1975  . Elbow fracture surgery Left 2002    "redo"  . Lumbar disc surgery  2008 X 2  . Posterior lumbar fusion  2008  . Hardware removal  2008    "took screw out of lower back"  . Epidural block injection    . Lumbar laminectomy/decompression microdiscectomy Left 07/13/2013    Procedure: Left Lumbar four-five microdiskectomy;  Surgeon: Carmela Hurt, MD;  Location: [facility];  Service: Neurosurgery;  Laterality: Left;  Left Lumbar four-five microdiskectomy  . Lumbar wound debridement N/A 09/21/2013    Procedure: LUMBAR WOUND re-exploration and disectomy;  Surgeon: Carmela Hurt, MD;  Location: [facility];  Service: Neurosurgery;  Laterality: N/A;  . Tonsillectomy    . Fracture surgery    . Back surgery     Family History  Problem Relation Age of Onset  . Multiple sclerosis Mother   . COPD Father   . Alcohol abuse  Maternal Grandfather   . Alcohol abuse Brother   . Cirrhosis Brother    Social History  Substance Use Topics  . Smoking status: Current Every Day Smoker -- 1.00 packs/day for 18 years    Types: Cigarettes  . Smokeless tobacco: Never Used  . Alcohol Use: No   OB History    No data available     Review of Systems  Constitutional: Negative for fever and chills.  Respiratory: Negative for shortness of breath.   Cardiovascular: Negative for chest pain and leg swelling.  Gastrointestinal: Negative for nausea, vomiting, abdominal pain, diarrhea and abdominal distention.  Musculoskeletal: Positive for back pain, arthralgias, gait problem and neck pain. Negative for myalgias, joint swelling and neck stiffness.       Pain is chronic. Located primarily in neck and back. Hx of  lumbar spine surgery. Pain also located in legs. Pt states they "give out" and that is when she falls.   Skin: Negative for pallor, rash and wound.  Psychiatric/Behavioral: Positive for confusion. Negative for suicidal ideas, hallucinations and self-injury.  All other systems reviewed and are negative.     Allergies  Minocycline  Home Medications   Prior to Admission medications   Medication Sig Start Date End Date Taking? Authorizing Provider  clonazePAM (KLONOPIN) 1 MG tablet Take 1 mg by mouth 3 (three) times daily.   Yes Historical Provider, MD  COMBIVENT RESPIMAT 20-100 MCG/ACT AERS respimat Inhale 1-2 puffs into the lungs every 6 (six) hours as needed for wheezing or shortness of breath.  10/15/14  Yes Historical Provider, MD  lamoTRIgine (LAMICTAL) 25 MG tablet Take 50 mg by mouth daily. 02/05/15  Yes Historical Provider, MD  ondansetron (ZOFRAN) 8 MG tablet Take 8 mg by mouth every 6 (six) hours as needed for vomiting.  01/31/14  Yes Historical Provider, MD  pregabalin (LYRICA) 300 MG capsule Take 1 capsule (300 mg total) by mouth at bedtime. 02/12/14  Yes Christiane Ha, MD  QUEtiapine (SEROQUEL) 100 MG tablet Take 100 mg by mouth at bedtime.   Yes Historical Provider, MD  traZODone (DESYREL) 150 MG tablet Take 0.5 tablets (75 mg total) by mouth at bedtime as needed for sleep. Patient not taking: Reported on 01/07/2015 02/12/14   Christiane Ha, MD   BP 104/69 mmHg  Pulse 81  Temp(Src) 98 F (36.7 C) (Oral)  Resp 14  SpO2 93%  LMP 01/11/2015 Physical Exam  Constitutional: She appears well-developed. No distress.  Extremely drowsy and difficult to arouse.  HENT:  Head: Normocephalic and atraumatic.  Mouth/Throat: No oropharyngeal exudate.  Buccal mucosa is very dry. Lips are cracked and dry.   Eyes: Conjunctivae are normal. Pupils are equal, round, and reactive to light.  Pupils are constricted. However, they are reactive to light.   Cardiovascular: Normal rate,  regular rhythm, normal heart sounds and intact distal pulses.  Exam reveals no gallop and no friction rub.   No murmur heard. Pulmonary/Chest: Effort normal and breath sounds normal. No respiratory distress. She has no wheezes. She has no rales. She exhibits no tenderness.  Respiratory Rate of 12 breaths per minute  Abdominal: Soft. Bowel sounds are normal. She exhibits no distension and no mass. There is no tenderness. There is no rebound and no guarding.  Musculoskeletal: Normal range of motion. She exhibits no edema or tenderness.  Neurological: Coordination abnormal.  Strength is poor, 3/5 in all extremities. No sensory deficits.   Pt is aware that she is in the hospital. Does  not know what year it is or her birthday.   Skin: Skin is warm and dry. No rash noted. She is not diaphoretic. No erythema. No pallor.  Vitals reviewed.   ED Course  Procedures (including critical care time) Pt seen, incoherent AMS labs ordered CT head wo contrast ordered Spoke with Dr. Juleen China who saw the pt. Given 1 Narcan, suspect over medicated EKG ordered Dr. Juleen China spoke with pt husband who states he has not seen wife since this morning. Husband also states that wife may have taken some hydrocodone at her mother's house but is unsure.  1 Narcan given. Pt unchanged other than pupils became slightly dilated.  Pt became progressively more alert during time in ED.  Pt deemed medically stable for discharge.  Pt husband will come pick up pt and has assumed care.  Pt instructed to only take at home medication AS PRESCRIBED.   Labs Review Labs Reviewed  COMPREHENSIVE METABOLIC PANEL - Abnormal; Notable for the following:    AST 87 (*)    ALT 199 (*)    Alkaline Phosphatase 136 (*)    All other components within normal limits  CBC WITH DIFFERENTIAL/PLATELET - Abnormal; Notable for the following:    RDW 15.9 (*)    All other components within normal limits  ACETAMINOPHEN LEVEL - Abnormal; Notable for the  following:    Acetaminophen (Tylenol), Serum <10 (*)    All other components within normal limits  BILIRUBIN, DIRECT - Abnormal; Notable for the following:    Bilirubin, Direct <0.1 (*)    All other components within normal limits  ETHANOL  LACTIC ACID, PLASMA  URINALYSIS, ROUTINE W REFLEX MICROSCOPIC (NOT AT [facility])  AMMONIA  SALICYLATE LEVEL  LACTIC ACID, PLASMA  URINE RAPID DRUG SCREEN, HOSP PERFORMED  BLOOD GAS, ARTERIAL    Imaging Review Ct Head Wo Contrast  02/11/2015   CLINICAL DATA:  Recent falls with drowsiness/altered mental status  EXAM: CT HEAD WITHOUT CONTRAST  TECHNIQUE: Contiguous axial images were obtained from the base of the skull through the vertex without intravenous contrast.  COMPARISON:  Oct 30, 2014  FINDINGS: The ventricles are normal in size and configuration. There is no intracranial mass, hemorrhage, extra-axial fluid collection, or midline shift. Gray-white compartments are normal. No acute infarct evident. Bony calvarium appears intact. The mastoid air cells are clear.  IMPRESSION: Study within normal limits.   Electronically Signed   By: Bretta Bang III M.D.   On: 02/11/2015 14:24   I have personally reviewed and evaluated these images and lab results as part of my medical decision-making.   EKG Interpretation   Date/Time:  Tuesday February 11 2015 15:13:25 EDT Ventricular Rate:  79 PR Interval:  140 QRS Duration: 87 QT Interval:  394 QTC Calculation: 452 R Axis:   71 Text Interpretation:  Sinus rhythm RSR' in V1 or V2, probably normal  variant Confirmed by Juleen China  MD, STEPHEN [phone]) on 02/11/2015 3:46:35 PM      MDM   Final diagnoses:  Altered mental status    Pt seen today for AMS and frequent falls. Pt is very drowsy on exam, unable to give coherent history. CT head w/o acute findings. Intervals ok on EKG. Labs unremarkable aside from elevations in LFTs. Afebrile. Dr. Juleen China spoke with husband who states that pt seemed like her normal self  this morning and then later in the day wen tto mothers house and possibly took additional hydrocodone. Pt then called husband saying she wasn't feeling well  and that she was coming into the hospital. Her speech apparently sounded slurred to the husband during this conversation. No trauma or injury during recent falls that pt is aware of. Acetaminophen and salicylate levels are within normal limits. Pt has hx of self overdose. Pt is most likely overmedicated. Pt given 1 Narcan during ED stay, prompted pupillary response but did not improve sedation. However, throughout stay in ED pt became more alert and awake and requests to go home. Pt is not A & O x3. Pt stable for discharge if husband can pick her up or continue to observe her in ED until pt can ambulate without assistance. Pt instructed to take at home medications ONLY AS PRESCRIBED to prevent future complications, falls, and drowsiness.     Lester Kinsman [place], PA-C 02/11/15 1712  Raeford Razor, MD 02/13/15 [phone]
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
## 2015-03-03 IMAGING — CT CT HEAD W/O CM
2 series · 17 of 30 positions shown, 20 images · non-contrast
Comparison: 02/23/2013

CLINICAL DATA: Syncope, headaches.

EXAM:
CT HEAD WITHOUT CONTRAST
TECHNIQUE: Contiguous axial images were obtained from the base of the skull
through the vertex without intravenous contrast.

[Series 2: head w/o · axial · non-contrast · 0.48mm/px · z∈[-672,-567]mm · 9 of 27 slices shown, 12 images]
[im 3/27  brain]
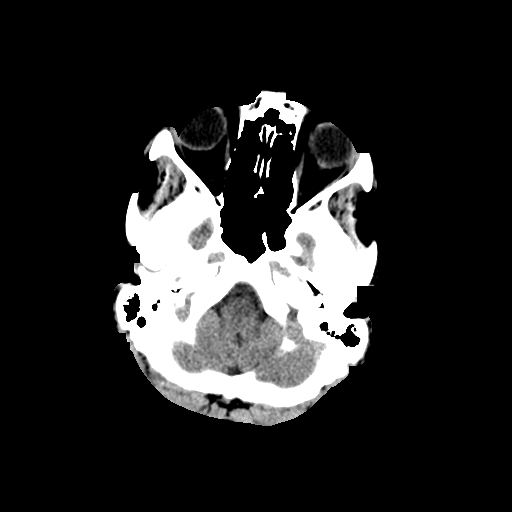
[im 3/27  bone]
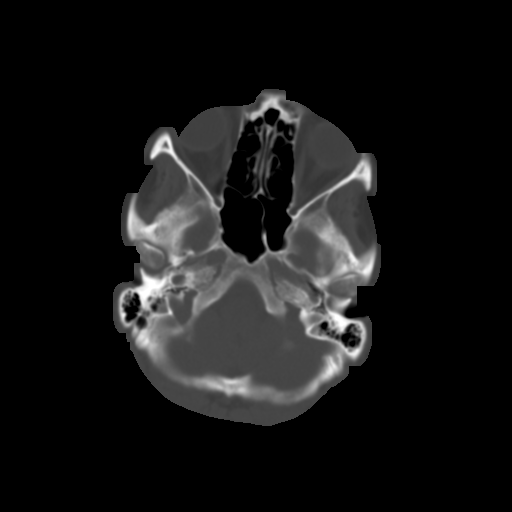
[im 6/27  brain]
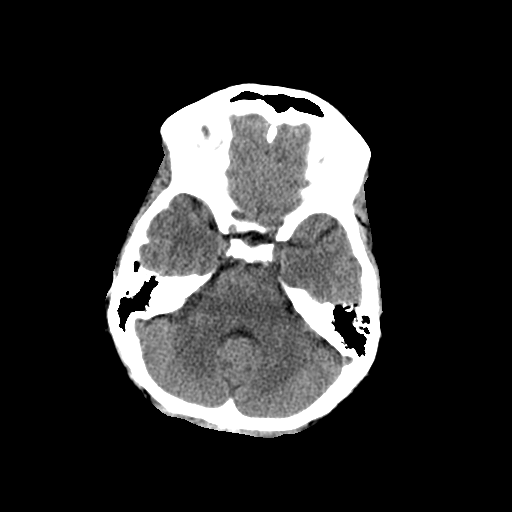
[im 8/27  brain]
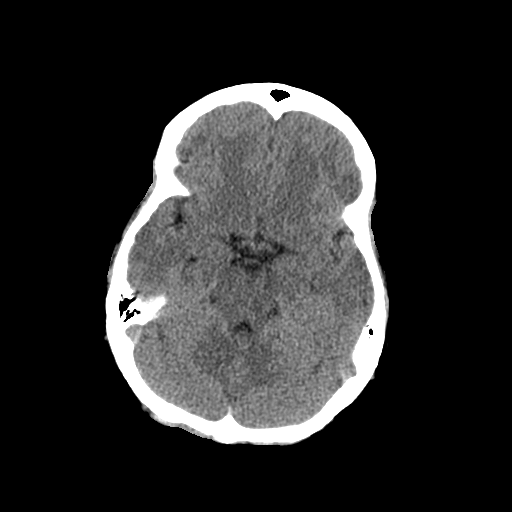
[im 11/27  brain]
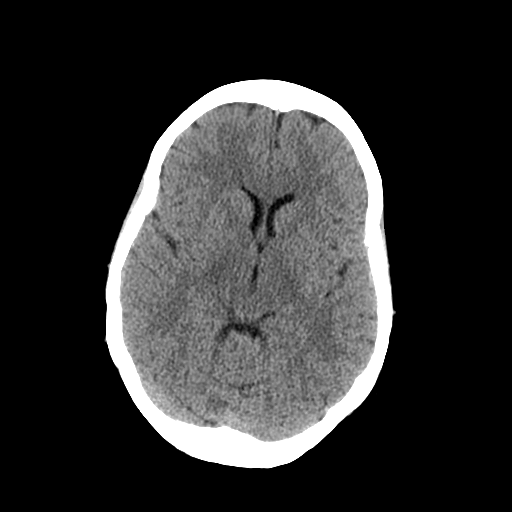
[im 14/27  brain]
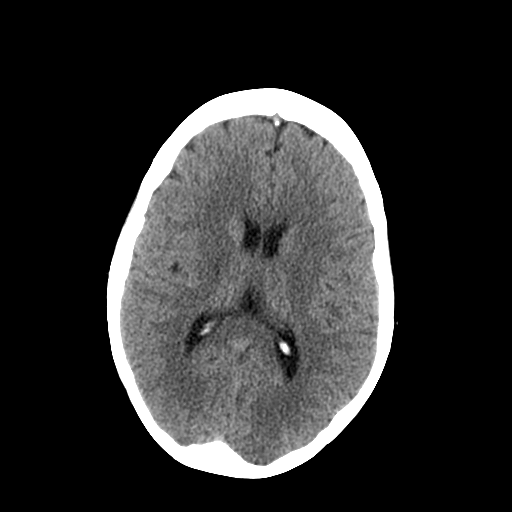
[im 14/27  bone]
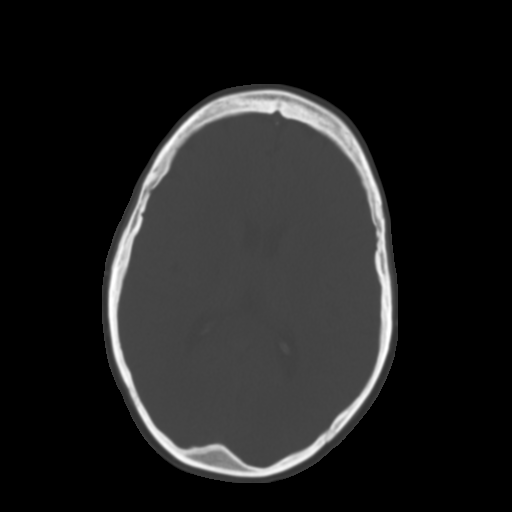
[im 16/27  brain]
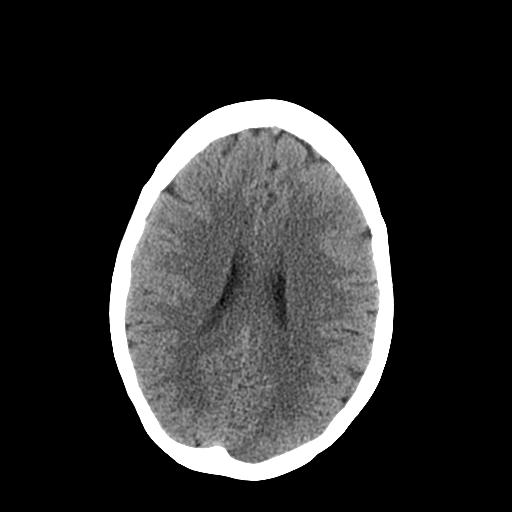
[im 19/27  brain]
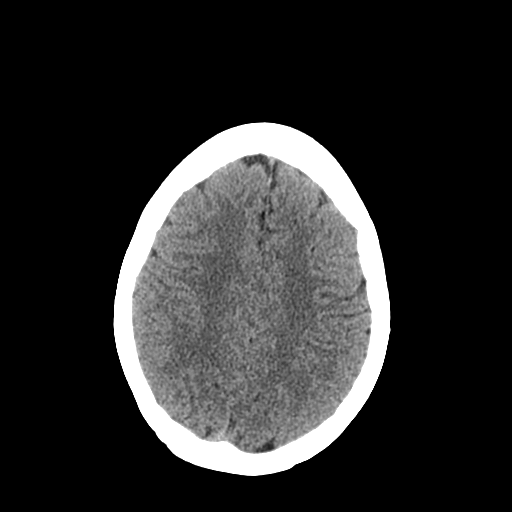
[im 21/27  brain]
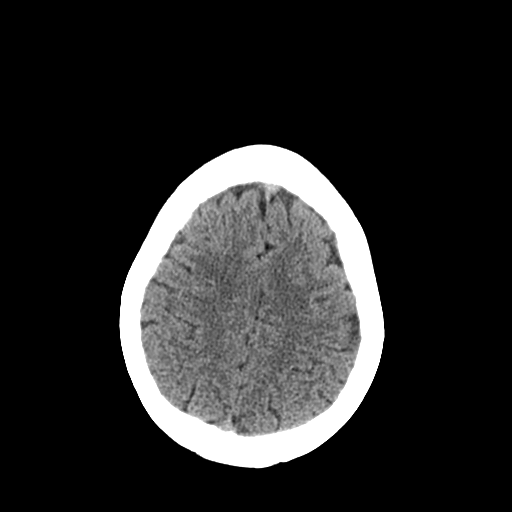
[im 24/27  brain]
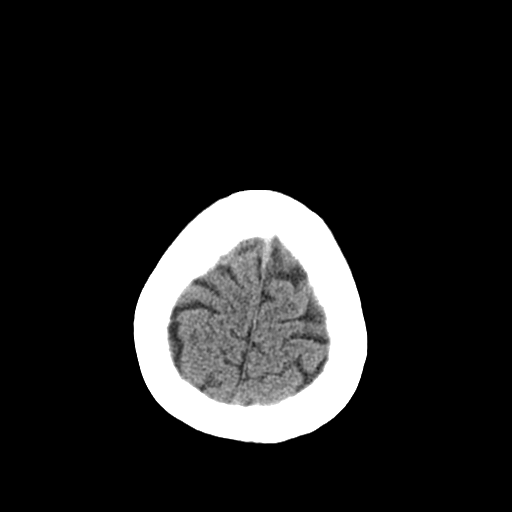
[im 24/27  bone]
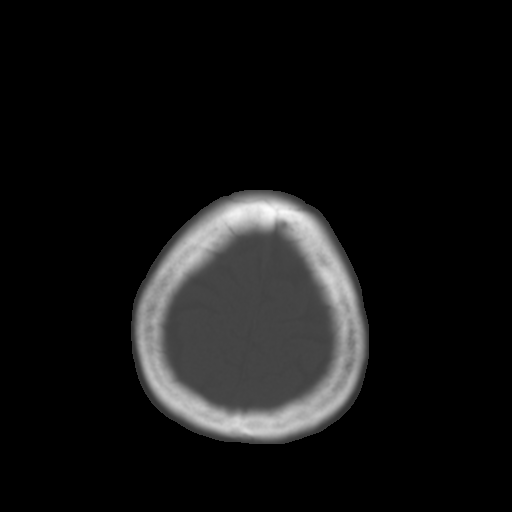

[Series 3: bone windows · axial · 0.48mm/px · z∈[-670,-565]mm · 8 of 45 slices shown]
[im 5/45  bone]
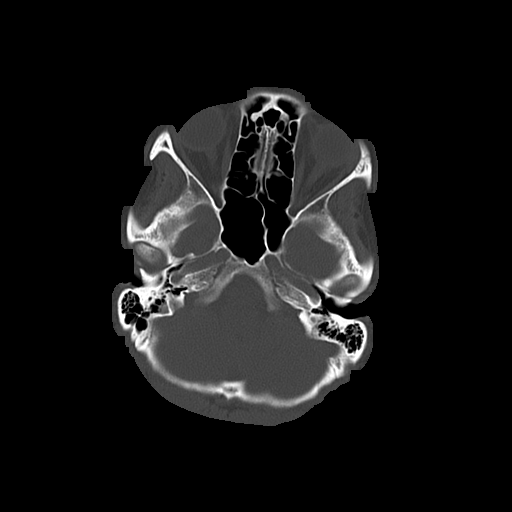
[im 10/45  bone]
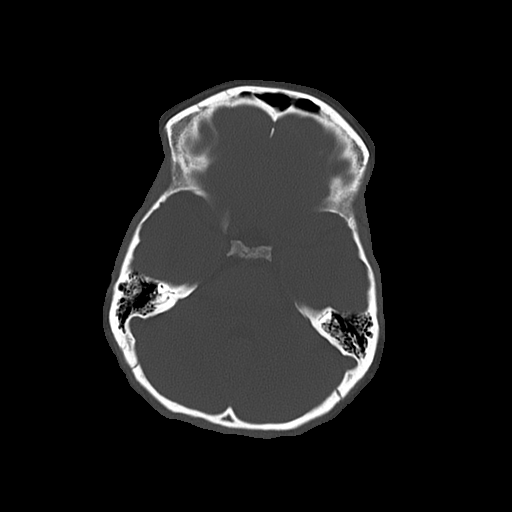
[im 15/45  bone]
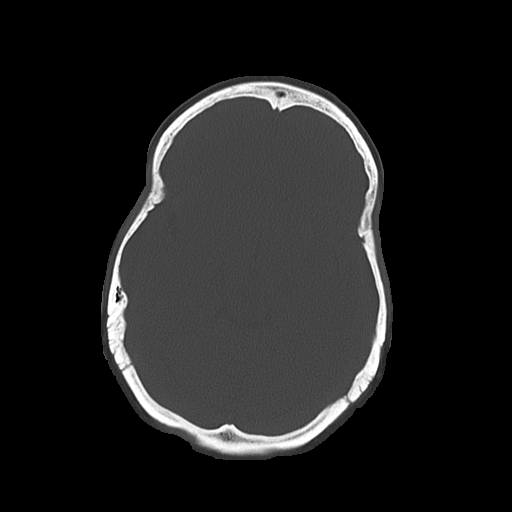
[im 20/45  bone]
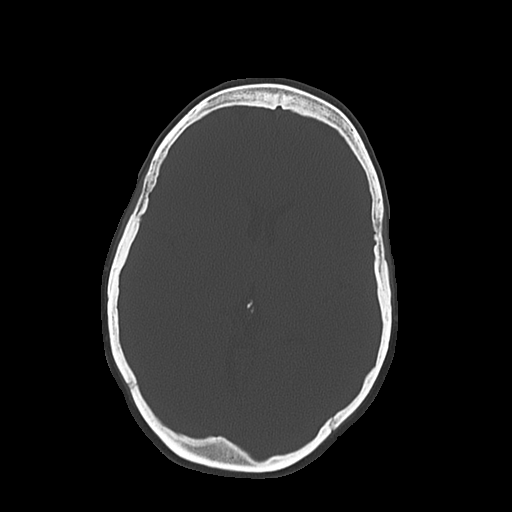
[im 25/45  bone]
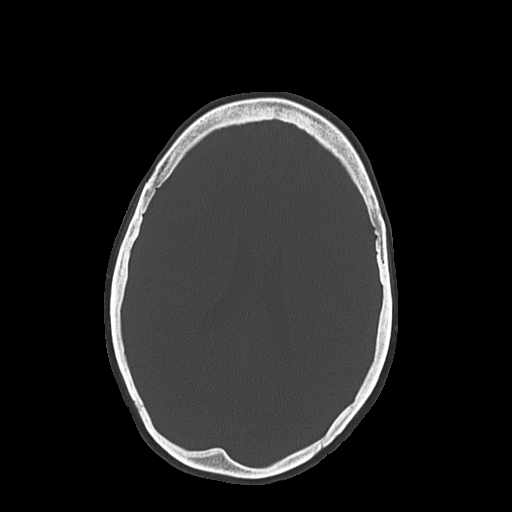
[im 30/45  bone]
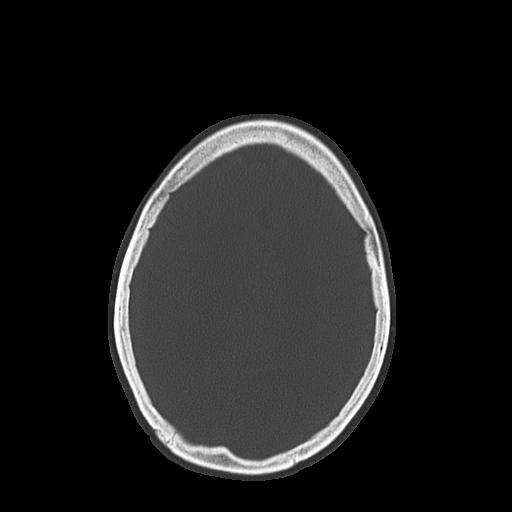
[im 35/45  bone]
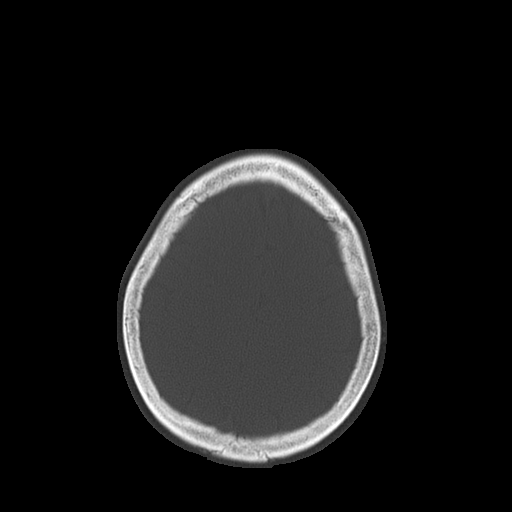
[im 40/45  bone]
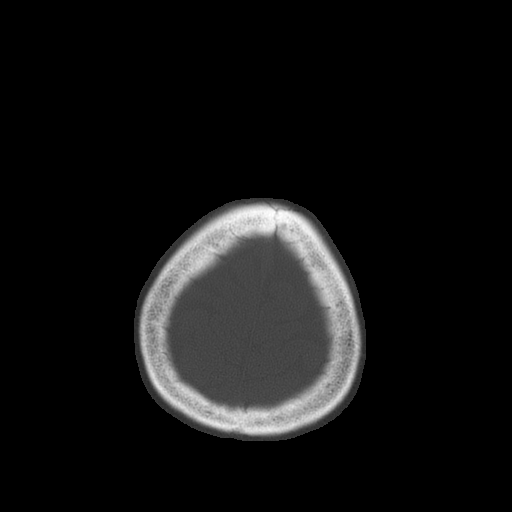

[17 of 30 positions shown; findings below may reference images not displayed]

FINDINGS: No acute intracranial abnormality. Specifically, no hemorrhage,
hydrocephalus, mass lesion, acute infarction, or significant
intracranial injury. No acute calvarial abnormality. Visualized
paranasal sinuses and mastoids clear. Orbital soft tissues
unremarkable.
IMPRESSION: Negative

## 2015-04-18 ENCOUNTER — Emergency Department (HOSPITAL_COMMUNITY)
Admission: EM | Admit: 2015-04-18 | Discharge: 2015-04-18 | Disposition: A | Discharge status: Home / Self Care | Payer: Managed Care, Other (non HMO) | Attending: Emergency Medicine | Admitting: Emergency Medicine

## 2015-04-18 ENCOUNTER — Emergency Department (HOSPITAL_COMMUNITY): Payer: Managed Care, Other (non HMO)

## 2015-04-18 ENCOUNTER — Encounter (HOSPITAL_COMMUNITY): Payer: Self-pay | Admitting: Emergency Medicine

## 2015-04-18 DIAGNOSIS — I10 Essential (primary) hypertension: Secondary | ICD-10-CM | POA: Insufficient documentation

## 2015-04-18 DIAGNOSIS — Z8614 Personal history of Methicillin resistant Staphylococcus aureus infection: Secondary | ICD-10-CM | POA: Insufficient documentation

## 2015-04-18 DIAGNOSIS — F319 Bipolar disorder, unspecified: Secondary | ICD-10-CM | POA: Diagnosis not present

## 2015-04-18 DIAGNOSIS — Z79899 Other long term (current) drug therapy: Secondary | ICD-10-CM | POA: Diagnosis not present

## 2015-04-18 DIAGNOSIS — J45909 Unspecified asthma, uncomplicated: Secondary | ICD-10-CM | POA: Diagnosis not present

## 2015-04-18 DIAGNOSIS — F1310 Sedative, hypnotic or anxiolytic abuse, uncomplicated: Secondary | ICD-10-CM | POA: Diagnosis not present

## 2015-04-18 DIAGNOSIS — M5442 Lumbago with sciatica, left side: Secondary | ICD-10-CM | POA: Diagnosis not present

## 2015-04-18 DIAGNOSIS — K219 Gastro-esophageal reflux disease without esophagitis: Secondary | ICD-10-CM | POA: Diagnosis not present

## 2015-04-18 DIAGNOSIS — Z3202 Encounter for pregnancy test, result negative: Secondary | ICD-10-CM | POA: Insufficient documentation

## 2015-04-18 DIAGNOSIS — F1110 Opioid abuse, uncomplicated: Secondary | ICD-10-CM | POA: Diagnosis not present

## 2015-04-18 DIAGNOSIS — G4700 Insomnia, unspecified: Secondary | ICD-10-CM | POA: Insufficient documentation

## 2015-04-18 DIAGNOSIS — F419 Anxiety disorder, unspecified: Secondary | ICD-10-CM | POA: Diagnosis not present

## 2015-04-18 DIAGNOSIS — Z8701 Personal history of pneumonia (recurrent): Secondary | ICD-10-CM | POA: Diagnosis not present

## 2015-04-18 DIAGNOSIS — G8929 Other chronic pain: Secondary | ICD-10-CM | POA: Diagnosis not present

## 2015-04-18 DIAGNOSIS — Z720 Tobacco use: Secondary | ICD-10-CM | POA: Insufficient documentation

## 2015-04-18 DIAGNOSIS — M545 Low back pain: Secondary | ICD-10-CM | POA: Diagnosis present

## 2015-04-18 LAB — CBC WITH DIFFERENTIAL/PLATELET
BASOS ABS: 0 10*3/uL (ref 0.0–0.1)
BASOS PCT: 0 %
Eosinophils Absolute: 0.1 10*3/uL (ref 0.0–0.7)
Eosinophils Relative: 1 %
HEMATOCRIT: 42 % (ref 36.0–46.0)
HEMOGLOBIN: 14 g/dL (ref 12.0–15.0)
Lymphocytes Relative: 4 %
Lymphs Abs: 0.6 10*3/uL — ABNORMAL LOW (ref 0.7–4.0)
MCH: 32.3 pg (ref 26.0–34.0)
MCHC: 33.3 g/dL (ref 30.0–36.0)
MCV: 97 fL (ref 78.0–100.0)
MONOS PCT: 4 %
Monocytes Absolute: 0.6 10*3/uL (ref 0.1–1.0)
NEUTROS ABS: 13.4 10*3/uL — AB (ref 1.7–7.7)
NEUTROS PCT: 91 %
Platelets: 517 10*3/uL — ABNORMAL HIGH (ref 150–400)
RBC: 4.33 MIL/uL (ref 3.87–5.11)
RDW: 14.9 % (ref 11.5–15.5)
WBC: 14.8 10*3/uL — ABNORMAL HIGH (ref 4.0–10.5)

## 2015-04-18 LAB — BASIC METABOLIC PANEL
ANION GAP: 9 (ref 5–15)
BUN: 11 mg/dL (ref 6–20)
CHLORIDE: 110 mmol/L (ref 101–111)
CO2: 23 mmol/L (ref 22–32)
Calcium: 9.4 mg/dL (ref 8.9–10.3)
Creatinine, Ser: 0.56 mg/dL (ref 0.44–1.00)
GFR calc non Af Amer: >60 mL/min (ref >60)
Glucose, Bld: 138 mg/dL — ABNORMAL HIGH (ref 65–99)
POTASSIUM: 3.8 mmol/L (ref 3.5–5.1)
Sodium: 142 mmol/L (ref 135–145)

## 2015-04-18 LAB — URINALYSIS, ROUTINE W REFLEX MICROSCOPIC
BILIRUBIN URINE: NEGATIVE
GLUCOSE, UA: NEGATIVE mg/dL
Hgb urine dipstick: NEGATIVE
KETONES UR: NEGATIVE mg/dL
Leukocytes, UA: NEGATIVE
Nitrite: NEGATIVE
PH: 6 (ref 5.0–8.0)
Protein, ur: NEGATIVE mg/dL
Specific Gravity, Urine: 1.01 (ref 1.005–1.030)
Urobilinogen, UA: 0.2 mg/dL (ref 0.0–1.0)

## 2015-04-18 LAB — RAPID URINE DRUG SCREEN, HOSP PERFORMED
AMPHETAMINES: NOT DETECTED
BARBITURATES: NOT DETECTED
BENZODIAZEPINES: POSITIVE — AB
COCAINE: NOT DETECTED
Opiates: POSITIVE — AB
TETRAHYDROCANNABINOL: NOT DETECTED

## 2015-04-18 LAB — PREGNANCY, URINE: Preg Test, Ur: NEGATIVE

## 2015-04-18 MED ORDER — KETOROLAC TROMETHAMINE 60 MG/2ML IM SOLN
30.0000 mg | Freq: Once | INTRAMUSCULAR | Status: AC
  Administered 2015-04-18: 30 mg via INTRAMUSCULAR
  Filled 2015-04-18: qty 2

## 2015-04-18 NOTE — Discharge Instructions (Signed)

## 2015-04-18 NOTE — ED Notes (Signed)
AVS explained in detail. Knows we are not going to prescribe pain medication for a chronic problem. Aware she is to follow up with pain management clinic ASAP-has appointment on Tuesday and that she is to follow up/set up with PCP for chronic problem management. Acknowledges understanding. Ambulatory to wheelchair. A&Ox4. No other c/c. Refused to e-sign.

## 2015-04-18 NOTE — ED Notes (Signed)
Pt ambulatory to bathroom

## 2015-04-18 NOTE — ED Provider Notes (Signed)
CSN: [id number]     Arrival date & time 04/18/15  [id number] History   First MD Initiated Contact with Patient 04/18/15 0902     Chief Complaint  Patient presents with  . Back Pain     (Consider location/radiation/quality/duration/timing/severity/associated sxs/prior Treatment) Patient is a 44 y.o. female presenting with back pain.  Back Pain Location:  Lumbar spine Quality:  Aching Radiates to:  Does not radiate Pain severity:  Severe Pain is:  Same all the time Onset quality:  Gradual Duration: chronically for months, no acute change. Timing:  Constant Progression:  Unchanged Chronicity:  Chronic Context: not falling, not recent illness and not recent injury   Relieved by:  Nothing Worsened by:  Nothing tried Associated symptoms: tingling (L LE)   Associated symptoms: no bladder incontinence, no bowel incontinence, no numbness and no perianal numbness     Past Medical History  Diagnosis Date  . Back pain   . Asthma     uses Combivent daily as needed  . Insomnia     takes Trazodone nightly  . Anxiety     takes Klonopin daily as needed  . PONV (postoperative nausea and vomiting)   . Cough   . Spinal headache   . Weakness     left leg  . GERD (gastroesophageal reflux disease)     takes Omeprazole daily  . Urinary urgency   . Bipolar 1 disorder (HCC)     takes Lamictal daily  . Chronic low back pain     HNP and Radiculopathy  . MRSA (methicillin resistant Staphylococcus aureus)     Postive nasal swab  . Hypertension   . Collapsed lung 3/15    "while in hospital awaiting back OR"  . CAP (community acquired pneumonia) 01/07/2015   Past Surgical History  Procedure Laterality Date  . Percutaneous pinning Right 1992    "hand"  . Anterior cervical decomp/discectomy fusion  1996    "took bone off her hip"  . Elbow fracture surgery Left 1975  . Elbow fracture surgery Left 2002    "redo"  . Lumbar disc surgery  2008 X 2  . Posterior lumbar fusion  2008  . Hardware  removal  2008    "took screw out of lower back"  . Epidural block injection    . Lumbar laminectomy/decompression microdiscectomy Left 07/13/2013    Procedure: Left Lumbar four-five microdiskectomy;  Surgeon: Carmela HurtKyle L Cabbell, MD;  Location: [facility];  Service: Neurosurgery;  Laterality: Left;  Left Lumbar four-five microdiskectomy  . Lumbar wound debridement N/A 09/21/2013    Procedure: LUMBAR WOUND re-exploration and disectomy;  Surgeon: Carmela HurtKyle L Cabbell, MD;  Location: [facility];  Service: Neurosurgery;  Laterality: N/A;  . Tonsillectomy    . Fracture surgery    . Back surgery     Family History  Problem Relation Age of Onset  . Multiple sclerosis Mother   . COPD Father   . Alcohol abuse Maternal Grandfather   . Alcohol abuse Brother   . Cirrhosis Brother    Social History  Substance Use Topics  . Smoking status: Current Every Day Smoker -- 1.00 packs/day for 18 years    Types: Cigarettes  . Smokeless tobacco: Never Used  . Alcohol Use: No   OB History    No data available     Review of Systems  Gastrointestinal: Negative for bowel incontinence.  Genitourinary: Negative for bladder incontinence.  Musculoskeletal: Positive for back pain.  Neurological: Positive for tingling (L  LE). Negative for numbness.  All other systems reviewed and are negative.     Allergies  Minocycline and Tramadol  Home Medications   Prior to Admission medications   Medication Sig Start Date End Date Taking? Authorizing Provider  busPIRone (BUSPAR) 15 MG tablet Take 1 tablet by mouth 2 (two) times a week. Prescribed 1 qd, pt states " i dont take it like that, maybe i take it twice a week" 03/10/15  Yes Historical Provider, MD  clonazePAM (KLONOPIN) 1 MG tablet Take 1 mg by mouth 3 (three) times daily.   Yes Historical Provider, MD  COMBIVENT RESPIMAT 20-100 MCG/ACT AERS respimat Inhale 1-2 puffs into the lungs every 6 (six) hours as needed for wheezing or shortness of breath.  10/15/14  Yes  Historical Provider, MD  ondansetron (ZOFRAN) 8 MG tablet Take 8 mg by mouth every 6 (six) hours as needed for vomiting.  01/31/14  Yes Historical Provider, MD  pregabalin (LYRICA) 300 MG capsule Take 1 capsule (300 mg total) by mouth at bedtime. Patient taking differently: Take 300 mg by mouth at bedtime as needed (pain).  02/12/14  Yes Christiane Ha, MD  QUEtiapine (SEROQUEL) 100 MG tablet Take 100 mg by mouth at bedtime.   Yes Historical Provider, MD  traZODone (DESYREL) 150 MG tablet Take 0.5 tablets (75 mg total) by mouth at bedtime as needed for sleep. Patient not taking: Reported on 01/07/2015 02/12/14   Christiane Ha, MD   BP 121/83 mmHg  Pulse 87  Temp(Src) 98.3 F (36.8 C) (Oral)  Resp 16  SpO2 94%  LMP 03/23/2015 (Approximate) Physical Exam  Constitutional: She is oriented to person, place, and time. She appears well-developed and well-nourished.  HENT:  Head: Normocephalic and atraumatic.  Right Ear: External ear normal.  Left Ear: External ear normal.  Eyes: Conjunctivae and EOM are normal. Pupils are equal, round, and reactive to light.  Neck: Normal range of motion. Neck supple.  Cardiovascular: Normal rate, regular rhythm, normal heart sounds and intact distal pulses.   Pulmonary/Chest: Effort normal and breath sounds normal.  Abdominal: Soft. Bowel sounds are normal. There is no tenderness.  Musculoskeletal: Normal range of motion.       Cervical back: Normal.       Thoracic back: Normal.       Lumbar back: She exhibits tenderness and bony tenderness.  Neurological: She is alert and oriented to person, place, and time.  No neuro deficits of extremities  Skin: Skin is warm and dry.  Vitals reviewed.   ED Course  Procedures (including critical care time) Labs Review Labs Reviewed  CBC WITH DIFFERENTIAL/PLATELET - Abnormal; Notable for the following:    WBC 14.8 (*)    Platelets 517 (*)    Neutro Abs 13.4 (*)    Lymphs Abs 0.6 (*)    All other  components within normal limits  BASIC METABOLIC PANEL - Abnormal; Notable for the following:    Glucose, Bld 138 (*)    All other components within normal limits  URINE RAPID DRUG SCREEN, HOSP PERFORMED - Abnormal; Notable for the following:    Opiates POSITIVE (*)    Benzodiazepines POSITIVE (*)    All other components within normal limits  URINALYSIS, ROUTINE W REFLEX MICROSCOPIC (NOT AT [facility])  PREGNANCY, URINE    Imaging Review Dg Chest 2 View  04/18/2015  CLINICAL DATA:  Woke up this morning with left lower back pain. Cough, smoker EXAM: CHEST  2 VIEW COMPARISON:  01/07/2015  FINDINGS: The heart size and mediastinal contours are within normal limits. Both lungs are clear. The visualized skeletal structures are unremarkable. IMPRESSION: No active cardiopulmonary disease. Electronically Signed   By: Charlett Nose M.D.   On: 04/18/2015 09:55   I have personally reviewed and evaluated these images and lab results as part of my medical decision-making.   EKG Interpretation None      MDM   Final diagnoses:  Bilateral low back pain with left-sided sciatica    44 y.o. female with pertinent PMH of bipolar 1, chronic back pain, prior PNA presents with chronic back pain without acute worsening.  She states she has a pain clinic appointment in 4 days, but cannot wait.  No new symptoms.  Pt has had a cough and began to have vomiting and diarrhea today.  Exam as above, likely chronic.  Dorna Bloom demonstrated no acute pathology.  I have high suspicion for drug seeking behavior as the pt repeatedly refuses care when denied narcotics.    I have reviewed all laboratory and imaging studies if ordered as above  1. Bilateral low back pain with left-sided sciatica         Mirian Mo, MD 04/18/15 1248

## 2015-04-18 NOTE — ED Notes (Signed)
Bed: [id number] Expected date:  Expected time:  Means of arrival:  Comments: EMS Sciatica

## 2015-04-18 NOTE — ED Notes (Signed)
Per EMS: Pt from home.  Pt states she woke up w/ LL back pain.  Chronic back pain but today was worse.  Pt was quiet during the whole ride from [place] however, when EMS pulled in the parking lot, she began moaning in pain.  Pt states she took vicodin and klonopin but it did not help.  When she found out that EMS was not going to give her pain medication, she told them to take her back home.  Pt then agreed to come to hospital.

## 2015-04-18 NOTE — ED Notes (Signed)
[facility] Expected date:  Expected time:  Means of arrival:  Comments: Using 22

## 2015-06-27 ENCOUNTER — Encounter (HOSPITAL_COMMUNITY): Payer: Self-pay | Admitting: Emergency Medicine

## 2015-06-27 ENCOUNTER — Emergency Department (HOSPITAL_COMMUNITY): Payer: Managed Care, Other (non HMO)

## 2015-06-27 ENCOUNTER — Emergency Department (HOSPITAL_COMMUNITY)
Admission: EM | Admit: 2015-06-27 | Discharge: 2015-07-01 | Disposition: A | Discharge status: Other Acute Inpatient Hospital | Payer: Managed Care, Other (non HMO) | Attending: Emergency Medicine | Admitting: Emergency Medicine

## 2015-06-27 DIAGNOSIS — Z8614 Personal history of Methicillin resistant Staphylococcus aureus infection: Secondary | ICD-10-CM | POA: Insufficient documentation

## 2015-06-27 DIAGNOSIS — G4700 Insomnia, unspecified: Secondary | ICD-10-CM | POA: Diagnosis not present

## 2015-06-27 DIAGNOSIS — F39 Unspecified mood [affective] disorder: Secondary | ICD-10-CM | POA: Diagnosis not present

## 2015-06-27 DIAGNOSIS — Z8701 Personal history of pneumonia (recurrent): Secondary | ICD-10-CM | POA: Diagnosis not present

## 2015-06-27 DIAGNOSIS — J45909 Unspecified asthma, uncomplicated: Secondary | ICD-10-CM | POA: Diagnosis not present

## 2015-06-27 DIAGNOSIS — R4182 Altered mental status, unspecified: Secondary | ICD-10-CM | POA: Diagnosis not present

## 2015-06-27 DIAGNOSIS — K219 Gastro-esophageal reflux disease without esophagitis: Secondary | ICD-10-CM | POA: Insufficient documentation

## 2015-06-27 DIAGNOSIS — I10 Essential (primary) hypertension: Secondary | ICD-10-CM | POA: Diagnosis not present

## 2015-06-27 DIAGNOSIS — F1310 Sedative, hypnotic or anxiolytic abuse, uncomplicated: Secondary | ICD-10-CM | POA: Insufficient documentation

## 2015-06-27 DIAGNOSIS — F419 Anxiety disorder, unspecified: Secondary | ICD-10-CM | POA: Insufficient documentation

## 2015-06-27 DIAGNOSIS — R55 Syncope and collapse: Secondary | ICD-10-CM | POA: Diagnosis not present

## 2015-06-27 DIAGNOSIS — Z79899 Other long term (current) drug therapy: Secondary | ICD-10-CM | POA: Insufficient documentation

## 2015-06-27 DIAGNOSIS — F919 Conduct disorder, unspecified: Secondary | ICD-10-CM | POA: Diagnosis not present

## 2015-06-27 DIAGNOSIS — F332 Major depressive disorder, recurrent severe without psychotic features: Secondary | ICD-10-CM | POA: Diagnosis not present

## 2015-06-27 DIAGNOSIS — G8929 Other chronic pain: Secondary | ICD-10-CM | POA: Diagnosis not present

## 2015-06-27 DIAGNOSIS — R1010 Upper abdominal pain, unspecified: Secondary | ICD-10-CM | POA: Diagnosis not present

## 2015-06-27 DIAGNOSIS — F17210 Nicotine dependence, cigarettes, uncomplicated: Secondary | ICD-10-CM | POA: Diagnosis not present

## 2015-06-27 DIAGNOSIS — R4189 Other symptoms and signs involving cognitive functions and awareness: Secondary | ICD-10-CM

## 2015-06-27 DIAGNOSIS — R404 Transient alteration of awareness: Secondary | ICD-10-CM

## 2015-06-27 LAB — URINALYSIS, ROUTINE W REFLEX MICROSCOPIC
GLUCOSE, UA: NEGATIVE mg/dL
Ketones, ur: 40 mg/dL — AB
LEUKOCYTES UA: NEGATIVE
Nitrite: NEGATIVE
PH: 5.5 (ref 5.0–8.0)
PROTEIN: NEGATIVE mg/dL
Specific Gravity, Urine: 1.022 (ref 1.005–1.030)

## 2015-06-27 LAB — RAPID URINE DRUG SCREEN, HOSP PERFORMED
Amphetamines: NOT DETECTED
BARBITURATES: NOT DETECTED
BENZODIAZEPINES: POSITIVE — AB
COCAINE: NOT DETECTED
OPIATES: NOT DETECTED
TETRAHYDROCANNABINOL: NOT DETECTED

## 2015-06-27 LAB — CBC
HEMATOCRIT: 49 % — AB (ref 36.0–46.0)
Hemoglobin: 16.4 g/dL — ABNORMAL HIGH (ref 12.0–15.0)
MCH: 31.3 pg (ref 26.0–34.0)
MCHC: 33.5 g/dL (ref 30.0–36.0)
MCV: 93.5 fL (ref 78.0–100.0)
Platelets: 611 10*3/uL — ABNORMAL HIGH (ref 150–400)
RBC: 5.24 MIL/uL — AB (ref 3.87–5.11)
RDW: 14.8 % (ref 11.5–15.5)
WBC: 12.8 10*3/uL — AB (ref 4.0–10.5)

## 2015-06-27 LAB — COMPREHENSIVE METABOLIC PANEL
ALT: 13 U/L — ABNORMAL LOW (ref 14–54)
ANION GAP: 17 — AB (ref 5–15)
AST: 24 U/L (ref 15–41)
Albumin: 4.7 g/dL (ref 3.5–5.0)
Alkaline Phosphatase: 127 U/L — ABNORMAL HIGH (ref 38–126)
BILIRUBIN TOTAL: 0.7 mg/dL (ref 0.3–1.2)
BUN: 29 mg/dL — ABNORMAL HIGH (ref 6–20)
CHLORIDE: 100 mmol/L — AB (ref 101–111)
CO2: 23 mmol/L (ref 22–32)
Calcium: 10 mg/dL (ref 8.9–10.3)
Creatinine, Ser: 0.89 mg/dL (ref 0.44–1.00)
Glucose, Bld: 152 mg/dL — ABNORMAL HIGH (ref 65–99)
POTASSIUM: 4.3 mmol/L (ref 3.5–5.1)
Sodium: 140 mmol/L (ref 135–145)
TOTAL PROTEIN: 9.6 g/dL — AB (ref 6.5–8.1)

## 2015-06-27 LAB — URINE MICROSCOPIC-ADD ON

## 2015-06-27 LAB — CBG MONITORING, ED: Glucose-Capillary: 155 mg/dL — ABNORMAL HIGH (ref 65–99)

## 2015-06-27 LAB — ETHANOL

## 2015-06-27 MED ORDER — LORAZEPAM 0.5 MG PO TABS: 0.5000 mg | ORAL_TABLET | Freq: Three times a day (TID) | ORAL | Status: DC | PRN

## 2015-06-27 MED ORDER — ONDANSETRON HCL 4 MG PO TABS
4.0000 mg | ORAL_TABLET | Freq: Three times a day (TID) | ORAL | Status: DC | PRN
  Administered 2015-07-01: 4 mg via ORAL
  Filled 2015-06-27: qty 1

## 2015-06-27 MED ORDER — ACETAMINOPHEN 325 MG PO TABS: 650.0000 mg | ORAL_TABLET | ORAL | Status: DC | PRN

## 2015-06-27 MED ORDER — CLONAZEPAM 0.5 MG PO TABS: 0.5000 mg | ORAL_TABLET | Freq: Two times a day (BID) | ORAL | Status: DC

## 2015-06-27 MED ORDER — IBUPROFEN 200 MG PO TABS: 600.0000 mg | ORAL_TABLET | Freq: Three times a day (TID) | ORAL | Status: DC | PRN

## 2015-06-27 MED ORDER — DULOXETINE HCL 30 MG PO CPEP
30.0000 mg | ORAL_CAPSULE | Freq: Every day | ORAL | Status: DC
  Administered 2015-06-30: 30 mg via ORAL
  Filled 2015-06-27 (×4): qty 1

## 2015-06-27 MED ORDER — CITALOPRAM HYDROBROMIDE 40 MG PO TABS
40.0000 mg | ORAL_TABLET | Freq: Every day | ORAL | Status: DC
  Administered 2015-06-30: 40 mg via ORAL
  Filled 2015-06-27 (×4): qty 1

## 2015-06-27 MED ORDER — QUETIAPINE FUMARATE 25 MG PO TABS
25.0000 mg | ORAL_TABLET | Freq: Three times a day (TID) | ORAL | Status: DC
  Administered 2015-06-29 – 2015-06-30 (×2): 25 mg via ORAL
  Filled 2015-06-27 (×3): qty 1

## 2015-06-27 MED ORDER — SODIUM CHLORIDE 0.9 % IV BOLUS (SEPSIS)
500.0000 mL | Freq: Once | INTRAVENOUS | Status: AC
  Administered 2015-06-27: 500 mL via INTRAVENOUS

## 2015-06-27 MED ORDER — IPRATROPIUM-ALBUTEROL 0.5-2.5 (3) MG/3ML IN SOLN
3.0000 mL | Freq: Four times a day (QID) | RESPIRATORY_TRACT | Status: DC | PRN
  Administered 2015-06-29: 3 mL via RESPIRATORY_TRACT
  Filled 2015-06-27: qty 3

## 2015-06-27 MED ORDER — BUPRENORPHINE HCL 8 MG SL SUBL: 8.0000 mg | SUBLINGUAL_TABLET | Freq: Every day | SUBLINGUAL | Status: DC

## 2015-06-27 MED ORDER — BUSPIRONE HCL 10 MG PO TABS
30.0000 mg | ORAL_TABLET | Freq: Two times a day (BID) | ORAL | Status: DC
  Administered 2015-06-29 – 2015-06-30 (×2): 30 mg via ORAL
  Filled 2015-06-27 (×3): qty 3

## 2015-06-27 MED ORDER — QUETIAPINE FUMARATE 100 MG PO TABS
200.0000 mg | ORAL_TABLET | Freq: Every day | ORAL | Status: DC
  Administered 2015-06-29 – 2015-06-30 (×2): 200 mg via ORAL
  Filled 2015-06-27 (×3): qty 2

## 2015-06-27 NOTE — BH Assessment (Signed)
Consulted with Nanine MeansJamison Lord, DNP who recommends inpatient treatment on 500 at this time.   Davina PokeJoVea Inez Stantz, LCSW Therapeutic Triage Specialist [facility] 06/27/2015 1:21 PM

## 2015-06-27 NOTE — BH Assessment (Signed)
Patients husband Mr. Shelva MajesticWest left his cell phone number as [phone]. States he has to leave due to having low blood sugar and states that he would like to remain informed on patients care.   Davina PokeJoVea Shaylah Mcghie, LCSW Therapeutic Triage Specialist [facility] 06/27/2015 2:17 PM

## 2015-06-27 NOTE — ED Notes (Signed)
Pt. Got out of bed walking to hallway, still nonverbal, no eye contact, appeared confused, redirected to room, followed. Placed back in bed safely, reoriented, drinks offered,pt. Still refused foods,drinks and medicine. Mouth been closed , gestured for "NO". This Nurse offered medicine but pt. Grabbed this Nurse's hand and pushed away. Pt. Continued to get out of bed many times, helped back to bed . Toilet offered but pt. Declined.pt. Is clean and dry. Out of bed to chair in the hallway as of 2150 , pt. Appeared calm at this time. Kept monitored. Still REFUSED to take medicines.

## 2015-06-27 NOTE — ED Notes (Signed)
Pt now sitting up in bed but will not respond to questioning.

## 2015-06-27 NOTE — ED Notes (Signed)
BH Counselor at bedside.  
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
## 2015-06-27 NOTE — ED Notes (Addendum)
Per husband patient was up, active and doing her normal activities before today. No injuries or other incidents to explain behavior.

## 2015-06-27 NOTE — BH Assessment (Addendum)
Assessment Note  Rachael FreezeRachel Ramirez is an 44 y.o. female who presents voluntarily to [facility] with her husband. Patient refused to participate in assessment and did not answer any questions. Patient looked forward during the assessment and did not make eye contact with this Clinical research associatewriter.   Patients husband states that the patient became unresponsive yesterday around 3:00PM. He states that the patients mother lives near by and went to check on her due to her not answering the phone. He states that the patients mother called him at work and states "she's just sitting in a stare." Patients husband states that when he got home from work the patient was on the couch and was unresponsive. He states that he does not know what may have triggered this event. He states that the patients "goes to behavioral [facility] [address] to get her Suboxone and klonopin" but he is not sure if she takes her medications as prescribed. He states that her medication changed about one month ago but he is not sure what she was previously on or what it is currently. Patients husband states that the patient has had several back surgeries and was addicted to pain medications until about two years ago when she started using heroin. He states that she has been off of heroin and going to the suboxone clinic but is being "weaned off" and is now prescribed two per day and gets a weeks worth of suboxone at a time. He states that she has been off of heroin and on suboxone for the past three weeks.  He states that the patient has been nauseous and has vomited three times since yesterday and he is not sure why.  He states that the patient has a history of seizures "where she just sits still and blinks but nothing like this."   Consulted with Nanine MeansJamison Lord, DNP who recommends inpatient Treatment at this time.   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 Back pain   . Asthma     uses  Combivent daily as needed  . Insomnia     takes Trazodone nightly  . Anxiety     takes Klonopin daily as needed  . PONV (postoperative nausea and vomiting)   . Cough   . Spinal headache   . Weakness     left leg  . GERD (gastroesophageal reflux disease)     takes Omeprazole daily  . Urinary urgency   . Bipolar 1 disorder (HCC)     takes Lamictal daily  . Chronic low back pain     HNP and Radiculopathy  . MRSA (methicillin resistant Staphylococcus aureus)     Postive nasal swab  . Hypertension   . Collapsed lung 3/15    "while in hospital awaiting back OR"  . CAP (community acquired pneumonia) 01/07/2015    Past Surgical History  Procedure Laterality Date  . Percutaneous pinning Right 1992    "hand"  . Anterior cervical decomp/discectomy fusion  1996    "took bone off her hip"  . Elbow fracture surgery Left 1975  . Elbow fracture surgery Left 2002    "redo"  . Lumbar disc surgery  2008 X 2  . Posterior lumbar fusion  2008  . Hardware removal  2008    "took screw out of lower back"  . Epidural block injection    . Lumbar laminectomy/decompression microdiscectomy Left 07/13/2013    Procedure: Left Lumbar four-five microdiskectomy;  Surgeon:  Carmela Hurt, MD;  Location: [facility];  Service: Neurosurgery;  Laterality: Left;  Left Lumbar four-five microdiskectomy  . Lumbar wound debridement N/A 09/21/2013    Procedure: LUMBAR WOUND re-exploration and disectomy;  Surgeon: Carmela Hurt, MD;  Location: [facility];  Service: Neurosurgery;  Laterality: N/A;  . Tonsillectomy    . Fracture surgery    . Back surgery      Family History:  Family History  Problem Relation Age of Onset  . Multiple sclerosis Mother   . COPD Father   . Alcohol abuse Maternal Grandfather   . Alcohol abuse Brother   . Cirrhosis Brother     Social History:  reports that she has been smoking Cigarettes.  She has a 18 pack-year smoking history. She has never used smokeless tobacco. She reports  that she uses illicit drugs. She reports that she does not drink alcohol.  Additional Social History:  Alcohol / Drug Use Pain Medications: See PTA Prescriptions: See PTA Over the Counter: See PTA History of alcohol / drug use?:  (UTA) Withdrawal Symptoms:  (UTA)  CIWA: CIWA-Ar BP: 131/90 mmHg Pulse Rate: 95 COWS:    Allergies:  Allergies  Allergen Reactions  . Minocycline Nausea And Vomiting  . Tramadol Nausea And Vomiting    Home Medications:  (Not in a hospital admission)  OB/GYN Status:  No LMP recorded.  General Assessment Data Location of Assessment: [facility] TTS Assessment: In system Is this a Tele or Face-to-Face Assessment?: Face-to-Face Is this an Initial Assessment or a Re-assessment for this encounter?: Initial Assessment Marital status: Married Prospect name:  (UTA) Is patient pregnant?:  (UTA) Pregnancy Status:  (UTA) Living Arrangements:  (UTA) Can pt return to current living arrangement?:  (UTA) Admission Status: Voluntary Is patient capable of signing voluntary admission?: Yes Referral Source: Self/Family/Friend     Crisis Care Plan Living Arrangements:  (UTA) Name of Psychiatrist: "[facility] on [facility]" Name of Therapist:  Industrial/product designer)  Education Status Is patient currently in school?:  (UTA) Highest grade of school patient has completed:  (UTA)  Risk to self with the past 6 months Suicidal Ideation:  (UTA) Has patient been a risk to self within the past 6 months prior to admission? :  (UTA) Suicidal Intent:  (UTA) Has patient had any suicidal intent within the past 6 months prior to admission? :  (UTA) Is patient at risk for suicide?:  (UTA) Suicidal Plan?:  (UTA) Has patient had any suicidal plan within the past 6 months prior to admission? :  (UTA) Access to Means:  (UTA) What has been your use of drugs/alcohol within the last 12 months?:  (UTA) Previous Attempts/Gestures:  (UTA) How many times?:  (UTA) Other Self Harm Risks:   (UTA) Triggers for Past Attempts:  (UTA) Intentional Self Injurious Behavior:  (UTA) Family Suicide History:  (UTA) Recent stressful life event(s):  (UTA) Persecutory voices/beliefs?:  Rich Reining) Depression:  (UTA) Depression Symptoms:  (UTA) Substance abuse history and/or treatment for substance abuse?:  (UTA) Suicide prevention information given to non-admitted patients: Not applicable  Risk to Others within the past 6 months Homicidal Ideation:  (UTA) Does patient have any lifetime risk of violence toward others beyond the six months prior to admission? :  (UTA) Thoughts of Harm to Others:  (UTA) Current Homicidal Intent:  (UTA) Current Homicidal Plan:  (UTA) Access to Homicidal Means:  (UTA) Identified Victim:  (UTA) History of harm to others?:  (UTA) Assessment of Violence:  (UTA) Violent  Behavior Description:  (UTA) Does patient have access to weapons?:  (UTA) Criminal Charges Pending?:  (UTA) Does patient have a court date:  (UTA) Is patient on probation?:  (UTA)  Psychosis Hallucinations:  (UTA) Delusions:  (UTA)  Mental Status Report Appearance/Hygiene: Disheveled, In scrubs Eye Contact: Poor Motor Activity: Unable to assess Speech: Unable to assess Level of Consciousness: Alert Affect: Flat Anxiety Level: None Thought Processes: Unable to Assess Judgement: Unable to Assess Orientation: Unable to assess Obsessive Compulsive Thoughts/Behaviors: Unable to Assess  Cognitive Functioning Concentration: Unable to Assess Memory: Unable to Assess Insight: Unable to Assess Impulse Control: Unable to Assess Sleep: Unable to Assess Vegetative Symptoms: Unable to Assess     Prior Inpatient Therapy Prior Inpatient Therapy:  (UTA) Prior Therapy Dates:  (UTA) Prior Therapy Facilty/Provider(s):  (UTA) Reason for Treatment:  (UTA)  Prior Outpatient Therapy Prior Outpatient Therapy:  (UTA) Prior Therapy Dates: UTA Prior Therapy Facilty/Provider(s): UTA Reason for  Treatment: UTA Does patient have an ACCT team?: Unknown Does patient have Intensive In-House Services?  : Unknown Does patient have [facility] services? : Unknown Does patient have P4CC services?: Unknown                Advance Directives (For Healthcare) Does patient have an advance directive?: No    Additional Information 1:1 In Past 12 Months?:  (UTA) CIRT Risk:  (UTA) Elopement Risk:  (UTA) Does patient have medical clearance?: Yes     Disposition:  Disposition Initial Assessment Completed for this Encounter: Yes Disposition of Patient: Inpatient treatment program Nanine Means, DNP) Type of inpatient treatment program: Adult  On Site Evaluation by:   Reviewed with Physician:    Eshal Propps 06/27/2015 1:53 PM

## 2015-06-27 NOTE — ED Notes (Addendum)
Pt brought by husband for altered mental status, pt does not communicate, follow any basic commands, or track. Pt mumbles without opening her mouth as though she wants to communicate. Pupils are dilated and reactive. Right arm stays bent. Per family symptoms noticed yesterday at 1700, non of these symptoms are normal, no hx of these symptoms, pt's baseline is normal, alert, oriented. Pt has psychiatric history and takes psychiatric medications.

## 2015-06-27 NOTE — ED Provider Notes (Addendum)
CSN: [id number]     Arrival date & time 06/27/15  0920 History   First MD Initiated Contact with Patient 06/27/15 [phone]     Chief Complaint  Patient presents with  . Altered Mental Status    Level 5 caveat due to altered mental status  Patient is a 44 y.o. female presenting with altered mental status. The history is provided by the patient and the spouse.  Altered Mental Status  patient with altered mental status. Decreased responsiveness. Nonverbal. Will not follow commands. Will look to voice. Does have psychiatric history. Reportedly when husband went to work yesterday she was normal. Her other family member talk to her 3 and she seemed confused. When he came home from work last night she was nonverbal. Patient's husband states that there has been a change in medication somewhat recently. States he will get the actual medications. States change was a couple weeks ago. Review of records show she is on Suboxone. 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  Past Medical History  Diagnosis Date  . Back pain   . Asthma     uses Combivent daily as needed  . Insomnia     takes Trazodone nightly  . Anxiety     takes Klonopin daily as needed  . PONV (postoperative nausea and vomiting)   . Cough   . Spinal headache   . Weakness     left leg  . GERD (gastroesophageal reflux disease)     takes Omeprazole daily  . Urinary urgency   . Bipolar 1 disorder (HCC)     takes Lamictal daily  . Chronic low back pain     HNP and Radiculopathy  . MRSA (methicillin resistant Staphylococcus aureus)     Postive nasal swab  . Hypertension   . Collapsed lung 3/15    "while in hospital awaiting back OR"  . CAP (community acquired pneumonia) 01/07/2015   Past Surgical History  Procedure Laterality Date  . Percutaneous pinning Right 1992    "hand"  . Anterior cervical decomp/discectomy fusion  1996    "took bone off her hip"  . Elbow fracture surgery Left 1975  . Elbow fracture surgery Left 2002   "redo"  . Lumbar disc surgery  2008 X 2  . Posterior lumbar fusion  2008  . Hardware removal  2008    "took screw out of lower back"  . Epidural block injection    . Lumbar laminectomy/decompression microdiscectomy Left 07/13/2013    Procedure: Left Lumbar four-five microdiskectomy;  Surgeon: Carmela Hurt, MD;  Location: [facility];  Service: Neurosurgery;  Laterality: Left;  Left Lumbar four-five microdiskectomy  . Lumbar wound debridement N/A 09/21/2013    Procedure: LUMBAR WOUND re-exploration and disectomy;  Surgeon: Carmela Hurt, MD;  Location: [facility];  Service: Neurosurgery;  Laterality: N/A;  . Tonsillectomy    . Fracture surgery    . Back surgery     Family History  Problem Relation Age of Onset  . Multiple sclerosis Mother   . COPD Father   . Alcohol abuse Maternal Grandfather   . Alcohol abuse Brother   . Cirrhosis Brother    Social History  Substance Use Topics  . Smoking status: Current Every Day Smoker -- 1.00 packs/day for 18 years    Types: Cigarettes  . Smokeless tobacco: Never Used  . Alcohol Use: No   OB History    No data available     Review of Systems  Unable to perform ROS: Mental status change      Allergies  Minocycline and Tramadol  Home Medications   Prior to Admission medications   Medication Sig Start Date End Date Taking? Authorizing Provider  buprenorphine-naloxone (SUBOXONE) 8-2 MG SUBL SL tablet Place 1 tablet under the tongue every 12 (twelve) hours.   Yes Historical Provider, MD  busPIRone (BUSPAR) 15 MG tablet Take 2 tablets by mouth 2 (two) times daily. Prescribed 1 qd, pt states " i dont take it like that, maybe i take it twice a week" 03/10/15  Yes Historical Provider, MD  citalopram (CELEXA) 20 MG tablet Take 40 mg by mouth daily.   Yes Historical Provider, MD  clonazePAM (KLONOPIN) 0.5 MG tablet Take 0.5 mg by mouth 2 (two) times daily.    Yes Historical Provider, MD  COMBIVENT RESPIMAT 20-100 MCG/ACT AERS respimat  Inhale 1-2 puffs into the lungs every 6 (six) hours as needed for wheezing or shortness of breath.  10/15/14  Yes Historical Provider, MD  DULoxetine (CYMBALTA) 30 MG capsule Take 30 mg by mouth daily.   Yes Historical Provider, MD  NARCAN 4 MG/0.1ML LIQD USE 1 SPRAY INTO NOSTRIL WITH OVERDOSE 06/06/15  Yes Historical Provider, MD  ondansetron (ZOFRAN) 8 MG tablet Take 8 mg by mouth every 6 (six) hours as needed for vomiting.  01/31/14  Yes Historical Provider, MD  pregabalin (LYRICA) 300 MG capsule Take 1 capsule (300 mg total) by mouth at bedtime. Patient taking differently: Take 300 mg by mouth every 12 (twelve) hours as needed (pain).  02/12/14  Yes Christiane Haorinna L Sullivan, MD  QUEtiapine (SEROQUEL) 200 MG tablet Take 200 mg by mouth at bedtime.   Yes Historical Provider, MD  QUEtiapine (SEROQUEL) 25 MG tablet Take 25 mg by mouth 3 (three) times daily.   Yes Historical Provider, MD  Tapentadol HCl (NUCYNTA) 100 MG TABS Take 100 mg by mouth 5 (five) times daily.   Yes Historical Provider, MD   BP 131/90 mmHg  Pulse 95  Temp(Src) 99.8 F (37.7 C) (Rectal)  Resp 18  SpO2 94% Physical Exam  Constitutional: She appears well-developed.  HENT:  Head: Atraumatic.  Eyes: Pupils are equal, round, and reactive to light.  Cardiovascular: Normal rate.   Pulmonary/Chest: Effort normal. No respiratory distress.  Abdominal: There is tenderness.  Mild upper abdominal tenderness without rebound or guarding.  Musculoskeletal: Normal range of motion.  Neurological:  Patient is awake. Will look to voice will follow me with her eyes. Will not follow commands. Will move all extremities spontaneously.  Skin: Skin is warm.  Psychiatric:  Patient is nonverbal. Will look to me. Will not follow commands.    ED Course  Procedures (including critical care time) Labs Review Labs Reviewed  COMPREHENSIVE METABOLIC PANEL - Abnormal; Notable for the following:    Chloride 100 (*)    Glucose, Bld 152 (*)    BUN 29 (*)     Total Protein 9.6 (*)    ALT 13 (*)    Alkaline Phosphatase 127 (*)    Anion gap 17 (*)    All other components within normal limits  CBC - Abnormal; Notable for the following:    WBC 12.8 (*)    RBC 5.24 (*)    Hemoglobin 16.4 (*)    HCT 49.0 (*)    Platelets 611 (*)    All other components within normal limits  URINE RAPID DRUG SCREEN, HOSP PERFORMED - Abnormal; Notable for the following:  Benzodiazepines POSITIVE (*)    All other components within normal limits  URINALYSIS, ROUTINE W REFLEX MICROSCOPIC (NOT AT [facility]) - Abnormal; Notable for the following:    Color, Urine AMBER (*)    APPearance CLOUDY (*)    Hgb urine dipstick LARGE (*)    Bilirubin Urine MODERATE (*)    Ketones, ur 40 (*)    All other components within normal limits  URINE MICROSCOPIC-ADD ON - Abnormal; Notable for the following:    Squamous Epithelial / LPF 0-5 (*)    Bacteria, UA FEW (*)    All other components within normal limits  CBG MONITORING, ED - Abnormal; Notable for the following:    Glucose-Capillary 155 (*)    All other components within normal limits  ETHANOL    Imaging Review Dg Chest Portable 1 View  06/27/2015  CLINICAL DATA:  Altered mental status. History of collapsed lung and hypertension per chart. EXAM: PORTABLE CHEST 1 VIEW COMPARISON:  None. FINDINGS: The heart size and mediastinal contours are within normal limits. Both lungs are clear, given the slightly low lung volumes. No pleural effusion or pneumothorax seen. The visualized skeletal structures are unremarkable. IMPRESSION: No active disease. Electronically Signed   By: Bary Richard M.D.   On: 06/27/2015 10:38   I have personally reviewed and evaluated these images and lab results as part of my medical decision-making.   EKG Interpretation None      MDM   Final diagnoses:  Unresponsive episode  Major depressive disorder, recurrent severe without psychotic features [facility])    Patient presents with altered mental  status. Has had episodes similarly the past including one that I witnessed while in the ER. Moving all extremities. Will be occasionally verbal, as she was she was getting her urinary catheterization. Likely there is a psychiatric component to this. Doubt this is a stroke or severe encephalopathy. Appears medically cleared minus some dehydration which was treated. Has been seen by TTS and inpatient treatment recommended. I had ordered home medicines but reportedly psychiatry will cancel patient's Suboxone.    Benjiman Core, MD 06/27/15 1451  This does not appear to be of a neurologic cause. Has had similar episodes in the past and has had a neurologic evaluation that time, including EEG.  Benjiman Core, MD 06/27/15 [phone]

## 2015-06-27 NOTE — ED Notes (Signed)
Pt not willing to open mouth.  Unable to give po meds.

## 2015-06-27 NOTE — ED Notes (Signed)
Pt.'s husband Haydee MonicaJeffrey Barsch cell no. [phone], to call for any changes of her condition.

## 2015-06-28 ENCOUNTER — Emergency Department (HOSPITAL_COMMUNITY): Payer: Managed Care, Other (non HMO)

## 2015-06-28 ENCOUNTER — Encounter (HOSPITAL_COMMUNITY): Payer: Self-pay | Admitting: Radiology

## 2015-06-28 DIAGNOSIS — R4182 Altered mental status, unspecified: Secondary | ICD-10-CM | POA: Diagnosis not present

## 2015-06-28 DIAGNOSIS — F332 Major depressive disorder, recurrent severe without psychotic features: Secondary | ICD-10-CM | POA: Diagnosis not present

## 2015-06-28 DIAGNOSIS — F919 Conduct disorder, unspecified: Secondary | ICD-10-CM

## 2015-06-28 DIAGNOSIS — F39 Unspecified mood [affective] disorder: Secondary | ICD-10-CM | POA: Diagnosis not present

## 2015-06-28 NOTE — Consult Note (Signed)
Admission H&P    Chief Complaint: Behavioral change with new onset muteness.  HPI: Rachael Ramirez is an 44 y.o. female with a history of 11, bipolar affective disorder, low back pain and hypertension, brought to the emergency room on 06/27/2015 by her spouse with a complaint that she had a sudden change in behavior and was no longer talking. Initial evaluation emergency room was unremarkable. Laboratory studies were unremarkable as well. Patient has continued to be mute. She's been noted to visually track but will not follow simple commands nor interact with staff otherwise. CT scan of her head was obtained today reportedly was normal. Urine drug screen was positive for benzodiazepines but was otherwise unremarkable. She takes clonazepam.  Past Medical History  Diagnosis Date  . Back pain   . Asthma     uses Combivent daily as needed  . Insomnia     takes Trazodone nightly  . Anxiety     takes Klonopin daily as needed  . PONV (postoperative nausea and vomiting)   . Cough   . Spinal headache   . Weakness     left leg  . GERD (gastroesophageal reflux disease)     takes Omeprazole daily  . Urinary urgency   . Bipolar 1 disorder ([place])     takes Lamictal daily  . Chronic low back pain     HNP and Radiculopathy  . MRSA (methicillin resistant Staphylococcus aureus)     Postive nasal swab  . Hypertension   . Collapsed lung 3/15    "while in hospital awaiting back OR"  . CAP (community acquired pneumonia) 01/07/2015    Past Surgical History  Procedure Laterality Date  . Percutaneous pinning Right 1992    "hand"  . Anterior cervical decomp/discectomy fusion  1996    "took bone off her hip"  . Elbow fracture surgery Left 1975  . Elbow fracture surgery Left 2002    "redo"  . Lumbar disc surgery  2008 X 2  . Posterior lumbar fusion  2008  . Hardware removal  2008    "took screw out of lower back"  . Epidural block injection    . Lumbar laminectomy/decompression microdiscectomy Left  07/13/2013    Procedure: Left Lumbar four-five microdiskectomy;  Surgeon: Winfield Cunas, MD;  Location: [facility];  Service: Neurosurgery;  Laterality: Left;  Left Lumbar four-five microdiskectomy  . Lumbar wound debridement N/A 09/21/2013    Procedure: LUMBAR WOUND re-exploration and disectomy;  Surgeon: Winfield Cunas, MD;  Location: [facility];  Service: Neurosurgery;  Laterality: N/A;  . Tonsillectomy    . Fracture surgery    . Back surgery      Family History  Problem Relation Age of Onset  . Multiple sclerosis Mother   . COPD Father   . Alcohol abuse Maternal Grandfather   . Alcohol abuse Brother   . Cirrhosis Brother    Social History:  reports that she has been smoking Cigarettes.  She has a 18 pack-year smoking history. She has never used smokeless tobacco. She reports that she uses illicit drugs. She reports that she does not drink alcohol.  Allergies:  Allergies  Allergen Reactions  . Minocycline Nausea And Vomiting  . Tramadol Nausea And Vomiting    Medications: Patient's preadmission medications were reviewed by me.  ROS: History obtained from chart review  General ROS: negative for - chills, fatigue, fever, night sweats, weight gain or weight loss Psychological ROS: As noted in present illness Ophthalmic ROS: negative  for - blurry vision, double vision, eye pain or loss of vision ENT ROS: negative for - epistaxis, nasal discharge, oral lesions, sore throat, tinnitus or vertigo Allergy and Immunology ROS: negative for - hives or itchy/watery eyes Hematological and Lymphatic ROS: negative for - bleeding problems, bruising or swollen lymph nodes Endocrine ROS: negative for - galactorrhea, hair pattern changes, polydipsia/polyuria or temperature intolerance Respiratory ROS: negative for - cough, hemoptysis, shortness of breath or wheezing Cardiovascular ROS: negative for - chest pain, dyspnea on exertion, edema or irregular heartbeat Gastrointestinal ROS:  negative for - abdominal pain, diarrhea, hematemesis, nausea/vomiting or stool incontinence Genito-Urinary ROS: negative for - dysuria, hematuria, incontinence or urinary frequency/urgency Musculoskeletal ROS: negative for - joint swelling or muscular weakness Neurological ROS: No headache or dizziness nor focal weakness or numbness; no reported gait abnormality or loss of consciousness Dermatological ROS: negative for rash and skin lesion changes  Physical Examination: Blood pressure 154/78, pulse 81, temperature 98 F (36.7 C), temperature source Axillary, resp. rate 18, SpO2 93 %.  HEENT-  Normocephalic, no lesions, without obvious abnormality.  Normal external eye and conjunctiva.  Normal TM's bilaterally.  Normal auditory canals and external ears. Normal external nose, mucus membranes and septum.  Normal pharynx. Neck supple with no masses, nodes, nodules or enlargement. Cardiovascular - regular rate and rhythm, S1, S2 normal, no murmur, click, rub or gallop Lungs - chest clear, no wheezing, rales, normal symmetric air entry Abdomen - soft, non-tender; bowel sounds normal; no masses,  no organomegaly Extremities - no joint deformities, effusion, or inflammation and no edema  Neurologic Examination: Patient was awake and appeared to be alert. She had no speech output and would not follow any commands. Pupils were equal and reacted normally to light. Extraocular movements were intact and full and conjugate on right left lateral gaze with visual tracking. No facial weakness was noted. Patient had no speech output. Muscle tone was normal throughout. He had withdrawal movements of lower extremities to plantar stimulation with normal and symmetrical strength. Deep tendon reflexes were 2+ and symmetrical. Plantar responses were flexor bilaterally. Range of motion of neck was full with no signs of meningeal irritation.  Results for orders placed or performed during the hospital encounter of  06/27/15 (from the past 48 hour(s))  CBG monitoring, ED     Status: Abnormal   Collection Time: 06/27/15  9:44 AM  Result Value Ref Range   Glucose-Capillary 155 (H) 65 - 99 mg/dL  Comprehensive metabolic panel     Status: Abnormal   Collection Time: 06/27/15  9:51 AM  Result Value Ref Range   Sodium 140 135 - 145 mmol/L   Potassium 4.3 3.5 - 5.1 mmol/L   Chloride 100 (L) 101 - 111 mmol/L   CO2 23 22 - 32 mmol/L   Glucose, Bld 152 (H) 65 - 99 mg/dL   BUN 29 (H) 6 - 20 mg/dL   Creatinine, Ser 0.89 0.44 - 1.00 mg/dL   Calcium 10.0 8.9 - 10.3 mg/dL   Total Protein 9.6 (H) 6.5 - 8.1 g/dL   Albumin 4.7 3.5 - 5.0 g/dL   AST 24 15 - 41 U/L   ALT 13 (L) 14 - 54 U/L   Alkaline Phosphatase 127 (H) 38 - 126 U/L   Total Bilirubin 0.7 0.3 - 1.2 mg/dL   GFR calc non Af Amer >60 >60 mL/min   GFR calc Af Amer >60 >60 mL/min    Comment: (NOTE) The eGFR has been calculated using the CKD  EPI equation. This calculation has not been validated in all clinical situations. eGFR's persistently <60 mL/min signify possible Chronic Kidney Disease.    Anion gap 17 (H) 5 - 15  CBC     Status: Abnormal   Collection Time: 06/27/15  9:51 AM  Result Value Ref Range   WBC 12.8 (H) 4.0 - 10.5 K/uL   RBC 5.24 (H) 3.87 - 5.11 MIL/uL   Hemoglobin 16.4 (H) 12.0 - 15.0 g/dL   HCT 49.0 (H) 36.0 - 46.0 %   MCV 93.5 78.0 - 100.0 fL   MCH 31.3 26.0 - 34.0 pg   MCHC 33.5 30.0 - 36.0 g/dL   RDW 14.8 11.5 - 15.5 %   Platelets 611 (H) 150 - 400 K/uL  Ethanol     Status: None   Collection Time: 06/27/15  9:51 AM  Result Value Ref Range   Alcohol, Ethyl (B) <5 <5 mg/dL    Comment:        LOWEST DETECTABLE LIMIT FOR SERUM ALCOHOL IS 5 mg/dL FOR MEDICAL PURPOSES ONLY   Urine rapid drug screen (hosp performed)     Status: Abnormal   Collection Time: 06/27/15 10:17 AM  Result Value Ref Range   Opiates NONE DETECTED NONE DETECTED   Cocaine NONE DETECTED NONE DETECTED   Benzodiazepines POSITIVE (A) NONE DETECTED    Amphetamines NONE DETECTED NONE DETECTED   Tetrahydrocannabinol NONE DETECTED NONE DETECTED   Barbiturates NONE DETECTED NONE DETECTED    Comment:        DRUG SCREEN FOR MEDICAL PURPOSES ONLY.  IF CONFIRMATION IS NEEDED FOR ANY PURPOSE, NOTIFY LAB WITHIN 5 DAYS.        LOWEST DETECTABLE LIMITS FOR URINE DRUG SCREEN Drug Class       Cutoff (ng/mL) Amphetamine      1000 Barbiturate      200 Benzodiazepine   301 Tricyclics       601 Opiates          300 Cocaine          300 THC              50   Urinalysis, Routine w reflex microscopic     Status: Abnormal   Collection Time: 06/27/15 10:17 AM  Result Value Ref Range   Color, Urine AMBER (A) YELLOW    Comment: BIOCHEMICALS MAY BE AFFECTED BY COLOR   APPearance CLOUDY (A) CLEAR   Specific Gravity, Urine 1.022 1.005 - 1.030   pH 5.5 5.0 - 8.0   Glucose, UA NEGATIVE NEGATIVE mg/dL   Hgb urine dipstick LARGE (A) NEGATIVE   Bilirubin Urine MODERATE (A) NEGATIVE   Ketones, ur 40 (A) NEGATIVE mg/dL   Protein, ur NEGATIVE NEGATIVE mg/dL   Nitrite NEGATIVE NEGATIVE   Leukocytes, UA NEGATIVE NEGATIVE  Urine microscopic-add on     Status: Abnormal   Collection Time: 06/27/15 10:17 AM  Result Value Ref Range   Squamous Epithelial / LPF 0-5 (A) NONE SEEN   WBC, UA 0-5 0 - 5 WBC/hpf   RBC / HPF 0-5 0 - 5 RBC/hpf   Bacteria, UA FEW (A) NONE SEEN   Ct Head Wo Contrast  06/28/2015  CLINICAL DATA:  Initial encounter for mental status changes. EXAM: CT HEAD WITHOUT CONTRAST TECHNIQUE: Contiguous axial images were obtained from the base of the skull through the vertex without intravenous contrast. COMPARISON:  02/11/2015. FINDINGS: There is no evidence for acute hemorrhage, hydrocephalus, mass lesion, or abnormal extra-axial fluid collection. No  definite CT evidence for acute infarction. The visualized paranasal sinuses and mastoid air cells are clear. IMPRESSION: Stable exam.  Normal CT evaluation of the brain. Electronically Signed   By: Misty Stanley M.D.   On: 06/28/2015 15:17   Dg Chest Portable 1 View  06/27/2015  CLINICAL DATA:  Altered mental status. History of collapsed lung and hypertension per chart. EXAM: PORTABLE CHEST 1 VIEW COMPARISON:  None. FINDINGS: The heart size and mediastinal contours are within normal limits. Both lungs are clear, given the slightly low lung volumes. No pleural effusion or pneumothorax seen. The visualized skeletal structures are unremarkable. IMPRESSION: No active disease. Electronically Signed   By: Franki Cabot M.D.   On: 06/27/2015 10:38    Assessment/Plan 44 year old lady with behavioral change with apparent ability to talk, of unclear etiology, but most likely psychophysiologic. Patient has no indication clinically nor by imaging study or laboratory studies of an acute neurological deficit.  No further neurological intervention is indicated at this point. Recommend psychiatric consultation and intervention for further management.  C.R. Nicole Kindred, [facility] [phone]  06/28/2015, 4:29 PM

## 2015-06-28 NOTE — ED Notes (Signed)
Personal belongings in locker 31.

## 2015-06-28 NOTE — ED Notes (Signed)
Called both phone numbers listed for the patient to obtain information from her family in [place] for [place] to be able to order an MRI of the brain.  No answer for either pone number.  Julieanne CottonJosephine, NP made aware.

## 2015-06-28 NOTE — ED Notes (Signed)
Attempted to give ordered medications to patient but she pushes my hands away when I give them to her.

## 2015-06-28 NOTE — ED Notes (Signed)
Pt was able to stand with 2 person assist.  She was very stiff and seemed to clench her arms.  She had urinated through her diaper and pants onto the bedpad.  All linens and scrubs changed after peri care done.  Once she was placed back in bed with full assist, she spit up a small amount of bile colored emesis.  Pt sat up high in the bed and will continue to observe.

## 2015-06-28 NOTE — ED Notes (Signed)
TTS MD at bedside. 

## 2015-06-28 NOTE — ED Notes (Signed)
Pt has been returned on stretcher from CT.  After a few minutes of arrival back to room, pt pulled herself up to seated and appeared to want to climb out of bed.  She was redirected and assisted to lie back down.  Still non-verbal.

## 2015-06-28 NOTE — ED Notes (Signed)
Neuro MD at bedside

## 2015-06-28 NOTE — ED Notes (Signed)
Pt alert to self, nonverbal making no eye contact with the staff or spouse, husband at the bedside. Will continue to monitor. Estill DoomsSimon, [place]uel[place] Rennie Mahario, RN 8:17 PM 06/28/2015

## 2015-06-28 NOTE — ED Notes (Signed)
Offered to get pt up out of bed to walk to bathroom.  Pt doesn't respond verbally or non-verbally.  Stares straight ahead and bottom lip continuously quivers.  Has a moist sounding, occasional cough that the TTS NP and MD are aware of.

## 2015-06-28 NOTE — Consult Note (Signed)
[facility] Consult   Reason for Consult:  Altered mental status, R/O Catatonia, depression Referring Physician:  EDP Patient Identification: Rachael Ramirez MRN:  [id number] Principal Diagnosis: Major depressive disorder, recurrent severe without psychotic features [facility]) Diagnosis:   Patient Active Problem List   Diagnosis Date Noted  . Unspecified mood (affective) disorder ([place]) [F39] 06/28/2015    Priority: High  . Major depressive disorder, recurrent severe without psychotic features ([place]) [F33.2]     Priority: High  . CAP (community acquired pneumonia) [J18.9] 01/07/2015  . Dehydration, mild [E86.0] 01/07/2015  . Tobacco abuse [Z72.0] 01/07/2015  . Acute asthmatic bronchitis [J45.901] 01/07/2015  . Overdose [T50.901A]   . Gait difficulty [R26.9] 02/11/2014  . Unresponsive episode [R40.4] 02/08/2014  . Acute back pain [M54.9] 09/23/2013  . Protein-calorie malnutrition, severe ([employer]) [E43] 09/18/2013  . Acute respiratory failure with hypoxia ([place]) [J96.01] 09/18/2013  . Drug overdose [T50.901A] 09/14/2013  . History of suicide attempt [Z91.89] 09/14/2013  . Recurrent Major depressive disorder,severe [F32.9] 09/14/2013  . Anxiety [F41.9] 09/14/2013  . Compression fracture of L1 lumbar vertebra with delayed healing [S32.010G] 09/14/2013  . Lumbar herniated disc [M51.26] 09/14/2013  . Abnormal EKG [R94.31] 09/14/2013  . Suicide attempt by drug ingestion ([place]) [T50.902A] 09/14/2013  . Chronic low back pain [M54.5, G89.29] 08/22/2013  . HNP (herniated nucleus pulposus), lumbar [M51.26] 07/13/2013  . Bipolar disorder ([place]) [F31.9] 09/29/2012    Total Time spent with patient: 45 minutes  Subjective:   Rachael Ramirez is a 44 y.o. female patient admitted with  Altered mental status, R/O Catatonia, depression  HPI: Attempted to evaluated this caucasian female, 44 years old but could not.  Patient is not talking to anybody but looks up to voices.  Patient was brought in by  her husband who reported that he came back from work and saw his wife not talking to him.  Patient was  seen here May this year after an OD and was also diagnosed with major depression.  Neurologist, DR Nicole Kindred came in and cleared patient neurologically today.  We have accepted patient for admission and we will continue to monitor and seek placement at any facility with available bed.  Past Psychiatric History:  Bipolar disorder, MDD with Psychosis  Risk to Self: Suicidal Ideation:  (UTA) Suicidal Intent:  (UTA) Is patient at risk for suicide?:  (UTA) Suicidal Plan?:  (UTA) Access to Means:  (UTA) What has been your use of drugs/alcohol within the last 12 months?:  (UTA) How many times?:  (UTA) Other Self Harm Risks:  (UTA) Triggers for Past Attempts:  (UTA) Intentional Self Injurious Behavior:  (UTA) Risk to Others: Homicidal Ideation:  (UTA) Thoughts of Harm to Others:  (UTA) Current Homicidal Intent:  (UTA) Current Homicidal Plan:  (UTA) Access to Homicidal Means:  (UTA) Identified Victim:  (UTA) History of harm to others?:  (UTA) Assessment of Violence:  (UTA) Violent Behavior Description:  (UTA) Does patient have access to weapons?:  (UTA) Criminal Charges Pending?:  (UTA) Does patient have a court date:  Special educational needs teacher) Prior Inpatient Therapy: Prior Inpatient Therapy:  (UTA) Prior Therapy Dates:  (UTA) Prior Therapy Facilty/Provider(s):  (UTA) Reason for Treatment:  (UTA) Prior Outpatient Therapy: Prior Outpatient Therapy:  (UTA) Prior Therapy Dates: UTA Prior Therapy Facilty/Provider(s): UTA Reason for Treatment: UTA Does patient have an ACCT team?: Unknown Does patient have Intensive In-House Services?  : Unknown Does patient have [facility] services? : Unknown Does patient have P4CC services?: Unknown  Past Medical History:  Past Medical  History  Diagnosis Date  . Back pain   . Asthma     uses Combivent daily as needed  . Insomnia     takes Trazodone nightly  . Anxiety      takes Klonopin daily as needed  . PONV (postoperative nausea and vomiting)   . Cough   . Spinal headache   . Weakness     left leg  . GERD (gastroesophageal reflux disease)     takes Omeprazole daily  . Urinary urgency   . Bipolar 1 disorder ([place])     takes Lamictal daily  . Chronic low back pain     HNP and Radiculopathy  . MRSA (methicillin resistant Staphylococcus aureus)     Postive nasal swab  . Hypertension   . Collapsed lung 3/15    "while in hospital awaiting back OR"  . CAP (community acquired pneumonia) 01/07/2015    Past Surgical History  Procedure Laterality Date  . Percutaneous pinning Right 1992    "hand"  . Anterior cervical decomp/discectomy fusion  1996    "took bone off her hip"  . Elbow fracture surgery Left 1975  . Elbow fracture surgery Left 2002    "redo"  . Lumbar disc surgery  2008 X 2  . Posterior lumbar fusion  2008  . Hardware removal  2008    "took screw out of lower back"  . Epidural block injection    . Lumbar laminectomy/decompression microdiscectomy Left 07/13/2013    Procedure: Left Lumbar four-five microdiskectomy;  Surgeon: Winfield Cunas, MD;  Location: [place] [facility];  Service: Neurosurgery;  Laterality: Left;  Left Lumbar four-five microdiskectomy  . Lumbar wound debridement N/A 09/21/2013    Procedure: LUMBAR WOUND re-exploration and disectomy;  Surgeon: Winfield Cunas, MD;  Location: [place] [facility];  Service: Neurosurgery;  Laterality: N/A;  . Tonsillectomy    . Fracture surgery    . Back surgery     Family History:  Family History  Problem Relation Age of Onset  . Multiple sclerosis Mother   . COPD Father   . Alcohol abuse Maternal Grandfather   . Alcohol abuse Brother   . Cirrhosis Brother    Family Psychiatric  History: unable to obtain Social History:  History  Alcohol Use No     History  Drug Use  . Yes    Comment: heroin    Social History   Social History  . Marital Status: Married    Spouse Name: N/A  . Number  of Children: 2  . Years of Education: hs   Occupational History  . Disabled    Social History Main Topics  . Smoking status: Current Every Day Smoker -- 1.00 packs/day for 18 years    Types: Cigarettes  . Smokeless tobacco: Never Used  . Alcohol Use: No  . Drug Use: Yes     Comment: heroin  . Sexual Activity: Yes   Other Topics Concern  . None   Social History Narrative   Additional Social History:    Pain Medications: See PTA Prescriptions: See PTA Over the Counter: See PTA History of alcohol / drug use?:  (UTA) Withdrawal Symptoms:  (UTA)    Allergies:   Allergies  Allergen Reactions  . Minocycline Nausea And Vomiting  . Tramadol Nausea And Vomiting    Labs:  Results for orders placed or performed during the hospital encounter of 06/27/15 (from the past 48 hour(s))  CBG monitoring, ED     Status: Abnormal  Collection Time: 06/27/15  9:44 AM  Result Value Ref Range   Glucose-Capillary 155 (H) 65 - 99 mg/dL  Comprehensive metabolic panel     Status: Abnormal   Collection Time: 06/27/15  9:51 AM  Result Value Ref Range   Sodium 140 135 - 145 mmol/L   Potassium 4.3 3.5 - 5.1 mmol/L   Chloride 100 (L) 101 - 111 mmol/L   CO2 23 22 - 32 mmol/L   Glucose, Bld 152 (H) 65 - 99 mg/dL   BUN 29 (H) 6 - 20 mg/dL   Creatinine, Ser 0.89 0.44 - 1.00 mg/dL   Calcium 10.0 8.9 - 10.3 mg/dL   Total Protein 9.6 (H) 6.5 - 8.1 g/dL   Albumin 4.7 3.5 - 5.0 g/dL   AST 24 15 - 41 U/L   ALT 13 (L) 14 - 54 U/L   Alkaline Phosphatase 127 (H) 38 - 126 U/L   Total Bilirubin 0.7 0.3 - 1.2 mg/dL   GFR calc non Af Amer >60 >60 mL/min   GFR calc Af Amer >60 >60 mL/min    Comment: (NOTE) The eGFR has been calculated using the CKD EPI equation. This calculation has not been validated in all clinical situations. eGFR's persistently <60 mL/min signify possible Chronic Kidney Disease.    Anion gap 17 (H) 5 - 15  CBC     Status: Abnormal   Collection Time: 06/27/15  9:51 AM  Result  Value Ref Range   WBC 12.8 (H) 4.0 - 10.5 K/uL   RBC 5.24 (H) 3.87 - 5.11 MIL/uL   Hemoglobin 16.4 (H) 12.0 - 15.0 g/dL   HCT 49.0 (H) 36.0 - 46.0 %   MCV 93.5 78.0 - 100.0 fL   MCH 31.3 26.0 - 34.0 pg   MCHC 33.5 30.0 - 36.0 g/dL   RDW 14.8 11.5 - 15.5 %   Platelets 611 (H) 150 - 400 K/uL  Ethanol     Status: None   Collection Time: 06/27/15  9:51 AM  Result Value Ref Range   Alcohol, Ethyl (B) <5 <5 mg/dL    Comment:        LOWEST DETECTABLE LIMIT FOR SERUM ALCOHOL IS 5 mg/dL FOR MEDICAL PURPOSES ONLY   Urine rapid drug screen (hosp performed)     Status: Abnormal   Collection Time: 06/27/15 10:17 AM  Result Value Ref Range   Opiates NONE DETECTED NONE DETECTED   Cocaine NONE DETECTED NONE DETECTED   Benzodiazepines POSITIVE (A) NONE DETECTED   Amphetamines NONE DETECTED NONE DETECTED   Tetrahydrocannabinol NONE DETECTED NONE DETECTED   Barbiturates NONE DETECTED NONE DETECTED    Comment:        DRUG SCREEN FOR MEDICAL PURPOSES ONLY.  IF CONFIRMATION IS NEEDED FOR ANY PURPOSE, NOTIFY LAB WITHIN 5 DAYS.        LOWEST DETECTABLE LIMITS FOR URINE DRUG SCREEN Drug Class       Cutoff (ng/mL) Amphetamine      1000 Barbiturate      200 Benzodiazepine   194 Tricyclics       174 Opiates          300 Cocaine          300 THC              50   Urinalysis, Routine w reflex microscopic     Status: Abnormal   Collection Time: 06/27/15 10:17 AM  Result Value Ref Range   Color, Urine AMBER (A) YELLOW  Comment: BIOCHEMICALS MAY BE AFFECTED BY COLOR   APPearance CLOUDY (A) CLEAR   Specific Gravity, Urine 1.022 1.005 - 1.030   pH 5.5 5.0 - 8.0   Glucose, UA NEGATIVE NEGATIVE mg/dL   Hgb urine dipstick LARGE (A) NEGATIVE   Bilirubin Urine MODERATE (A) NEGATIVE   Ketones, ur 40 (A) NEGATIVE mg/dL   Protein, ur NEGATIVE NEGATIVE mg/dL   Nitrite NEGATIVE NEGATIVE   Leukocytes, UA NEGATIVE NEGATIVE  Urine microscopic-add on     Status: Abnormal   Collection Time: 06/27/15  10:17 AM  Result Value Ref Range   Squamous Epithelial / LPF 0-5 (A) NONE SEEN   WBC, UA 0-5 0 - 5 WBC/hpf   RBC / HPF 0-5 0 - 5 RBC/hpf   Bacteria, UA FEW (A) NONE SEEN    Current Facility-Administered Medications  Medication Dose Route Frequency Provider Last Rate Last Dose  . acetaminophen (TYLENOL) tablet 650 mg  650 mg Oral Q4H PRN Davonna Belling, MD      . busPIRone (BUSPAR) tablet 30 mg  30 mg Oral BID Davonna Belling, MD   30 mg at 06/27/15 1416  . citalopram (CELEXA) tablet 40 mg  40 mg Oral Daily Davonna Belling, MD   40 mg at 06/27/15 1416  . DULoxetine (CYMBALTA) DR capsule 30 mg  30 mg Oral Daily Davonna Belling, MD   30 mg at 06/27/15 1417  . ibuprofen (ADVIL,MOTRIN) tablet 600 mg  600 mg Oral Q8H PRN Davonna Belling, MD      . ipratropium-albuterol (DUONEB) 0.5-2.5 (3) MG/3ML nebulizer solution 3 mL  3 mL Inhalation Q6H PRN Davonna Belling, MD      . LORazepam (ATIVAN) tablet 0.5 mg  0.5 mg Oral Q8H PRN Patrecia Pour, NP      . ondansetron [facility]) tablet 4 mg  4 mg Oral Q8H PRN Davonna Belling, MD      . QUEtiapine (SEROQUEL) tablet 200 mg  200 mg Oral QHS Davonna Belling, MD   200 mg at 06/27/15 2309  . QUEtiapine (SEROQUEL) tablet 25 mg  25 mg Oral TID Davonna Belling, MD   25 mg at 06/27/15 1624   Current Outpatient Prescriptions  Medication Sig Dispense Refill  . buprenorphine-naloxone (SUBOXONE) 8-2 MG SUBL SL tablet Place 1 tablet under the tongue every 12 (twelve) hours.    . busPIRone (BUSPAR) 15 MG tablet Take 2 tablets by mouth 2 (two) times daily. Prescribed 1 qd, pt states " i dont take it like that, maybe i take it twice a week"    . citalopram (CELEXA) 20 MG tablet Take 40 mg by mouth daily.    . clonazePAM (KLONOPIN) 0.5 MG tablet Take 0.5 mg by mouth 2 (two) times daily.     . COMBIVENT RESPIMAT 20-100 MCG/ACT AERS respimat Inhale 1-2 puffs into the lungs every 6 (six) hours as needed for wheezing or shortness of breath.   0  . DULoxetine  (CYMBALTA) 30 MG capsule Take 30 mg by mouth daily.    Marland Kitchen NARCAN 4 MG/0.1ML LIQD USE 1 SPRAY INTO NOSTRIL WITH OVERDOSE  0  . ondansetron (ZOFRAN) 8 MG tablet Take 8 mg by mouth every 6 (six) hours as needed for vomiting.     . pregabalin (LYRICA) 300 MG capsule Take 1 capsule (300 mg total) by mouth at bedtime. (Patient taking differently: Take 300 mg by mouth every 12 (twelve) hours as needed (pain). )    . QUEtiapine (SEROQUEL) 200 MG tablet Take 200 mg by  mouth at bedtime.    Marland Kitchen QUEtiapine (SEROQUEL) 25 MG tablet Take 25 mg by mouth 3 (three) times daily.    . Tapentadol HCl (NUCYNTA) 100 MG TABS Take 100 mg by mouth 5 (five) times daily.     Facility-Administered Medications Ordered in Other Encounters  Medication Dose Route Frequency Provider Last Rate Last Dose  . lactated ringers infusion    Continuous PRN Laretta Alstrom, CRNA        Musculoskeletal: Strength & Muscle Tone: Seen lying down in bed, not talking Gait & Station: seen in bed lying down Patient leans: see above  Psychiatric Specialty Exam: Review of Systems  Unable to perform ROS: mental acuity    Blood pressure 154/78, pulse 81, temperature 98 F (36.7 C), temperature source Axillary, resp. rate 18, SpO2 93 %.There is no weight on file to calculate BMI.  General Appearance: Casual and Disheveled  Eye Contact::  Minimal  Speech:  none  Volume:  none, not talking  Mood:  Euphoric  Affect:  Blunt and Congruent  Thought Process:  unable to assess, patient is not speaking or participating.  Orientation:  Other:  unable to obtain  Thought Content:  unable to obtain  Suicidal Thoughts:  unable to obtain  Homicidal Thoughts:  unable to obtain  Memory:  unable to obtain, patient is not speaking to staff or anybody  at this time.  Judgement:  Other:  unable to obtain  Insight:  unable to obtain  Psychomotor Activity:  Psychomotor Retardation  Concentration:  Fair  Recall:  unable to obtain  Fund of Knowledge:unable  to obtain  Language: unable to obtain  Akathisia:  No  Handed:  Right  AIMS (if indicated):     Assets:  Others:  unable to obtain  ADL's:  Impaired  Cognition: WNL  Sleep:      Treatment Plan Summary: Daily contact with patient to assess and evaluate symptoms and progress in treatment and Medication management  Disposition:  Accepted for admission and we will seek placement at any facility with available bed.   We have resumed  Home medications.  We will obtain a head ct ( already done)  Delfin Gant    PMHNP-BC 06/28/2015 5:54 PM Agree with NP assessment and plan

## 2015-06-28 NOTE — ED Notes (Signed)
Pt sitting up in bed with no signs of distress.  Awake but catatonic-like state.  Breakfast tray set up in front of her but she doesn't respond to it.  Will continue to monitor.

## 2015-06-29 ENCOUNTER — Emergency Department (HOSPITAL_COMMUNITY): Payer: Managed Care, Other (non HMO)

## 2015-06-29 LAB — CBC WITH DIFFERENTIAL/PLATELET
BASOS ABS: 0 10*3/uL (ref 0.0–0.1)
BASOS PCT: 0 %
EOS ABS: 0 10*3/uL (ref 0.0–0.7)
EOS PCT: 0 %
HCT: 44.2 % (ref 36.0–46.0)
HEMOGLOBIN: 14.7 g/dL (ref 12.0–15.0)
Lymphocytes Relative: 13 %
Lymphs Abs: 2 10*3/uL (ref 0.7–4.0)
MCH: 31.4 pg (ref 26.0–34.0)
MCHC: 33.3 g/dL (ref 30.0–36.0)
MCV: 94.4 fL (ref 78.0–100.0)
Monocytes Absolute: 0.8 10*3/uL (ref 0.1–1.0)
Monocytes Relative: 6 %
NEUTROS PCT: 81 %
Neutro Abs: 12.2 10*3/uL — ABNORMAL HIGH (ref 1.7–7.7)
PLATELETS: 472 10*3/uL — AB (ref 150–400)
RBC: 4.68 MIL/uL (ref 3.87–5.11)
RDW: 15.1 % (ref 11.5–15.5)
WBC: 15 10*3/uL — AB (ref 4.0–10.5)

## 2015-06-29 LAB — COMPREHENSIVE METABOLIC PANEL
ALBUMIN: 5 g/dL (ref 3.5–5.0)
ALK PHOS: 102 U/L (ref 38–126)
ALT: 10 U/L — AB (ref 14–54)
AST: 14 U/L — AB (ref 15–41)
Anion gap: 15 (ref 5–15)
BUN: 32 mg/dL — ABNORMAL HIGH (ref 6–20)
CHLORIDE: 109 mmol/L (ref 101–111)
CO2: 24 mmol/L (ref 22–32)
CREATININE: 0.73 mg/dL (ref 0.44–1.00)
Calcium: 10 mg/dL (ref 8.9–10.3)
GFR calc non Af Amer: >60 mL/min (ref >60)
GLUCOSE: 120 mg/dL — AB (ref 65–99)
Potassium: 3.7 mmol/L (ref 3.5–5.1)
SODIUM: 148 mmol/L — AB (ref 135–145)
Total Bilirubin: 0.9 mg/dL (ref 0.3–1.2)
Total Protein: 9.1 g/dL — ABNORMAL HIGH (ref 6.5–8.1)

## 2015-06-29 LAB — CBG MONITORING, ED: GLUCOSE-CAPILLARY: 124 mg/dL — AB (ref 65–99)

## 2015-06-29 LAB — CK: Total CK: 21 U/L — ABNORMAL LOW (ref 38–234)

## 2015-06-29 MED ORDER — LORAZEPAM 2 MG/ML IJ SOLN
1.0000 mg | Freq: Every day | INTRAMUSCULAR | Status: DC
  Administered 2015-06-29: 1 mg via INTRAVENOUS
  Filled 2015-06-29: qty 1

## 2015-06-29 MED ORDER — SODIUM CHLORIDE 0.9 % IV BOLUS (SEPSIS)
1000.0000 mL | Freq: Once | INTRAVENOUS | Status: AC
  Administered 2015-06-29: 1000 mL via INTRAVENOUS

## 2015-06-29 NOTE — BHH Counselor (Signed)
Patients husband, Fanny DanceJeff Dray,  left phone numbers to reach him as [phone] and [phone].  Davina PokeJoVea Dondrell Loudermilk, LCSW Therapeutic Triage Specialist [facility] 06/29/2015 6:47 PM

## 2015-06-29 NOTE — ED Notes (Signed)
Patient refused to eat.

## 2015-06-29 NOTE — Progress Notes (Signed)
CSW met with pt and pt's husband at bedside. Pt unresponsive and did not participate in meeting. Husband reports that pt is client of [facility] for past three weeks for help with opiate addiction and associated mental health issues. He does not know her diagnoses, other than that she is an "addict" and went to [facility] primarily for suboxone. Husband reports pt was prescribed "twelve medications, but I'm not sure what any of them are, except the suboxone. I also think they gave her xanax with suboxone and I've heard you're not supposed to do that. I'm just worried if this [her unresponsiveness] is being caused by the medicine. And what if it isn't reversible." He says she has never seen her like this.  CSW provided supportive counseling. Husband requests that we call [facility] to get a full recount of the medicines she is taking. CSW made psych team aware.    LCSWA Clinical Social Worker [facility] phone: [phone]   

## 2015-06-29 NOTE — ED Notes (Signed)
HOLD ON BMP

## 2015-06-29 NOTE — Consult Note (Signed)
Psychiatric Specialty Exam: Physical Exam  ROS  Blood pressure 156/84, pulse 98, temperature 98.9 F (37.2 C), temperature source Axillary, resp. rate 20, SpO2 96 %.There is no weight on file to calculate BMI.  General Appearance: Casual and Disheveled  Eye Contact:: Minimal  Speech: none  Volume: none, not talking  Mood: Euphoric  Affect: Blunt and Congruent  Thought Process: unable to assess, patient is not speaking or participating.  Orientation: Other: unable to obtain  Thought Content: unable to obtain  Suicidal Thoughts: unable to obtain  Homicidal Thoughts: unable to obtain  Memory: unable to obtain, patient is not speaking to staff or anybody at this time.  Judgement: Other: unable to obtain  Insight: unable to obtain  Psychomotor Activity: Psychomotor Retardation  Concentration: Fair  Recall: unable to obtain  Fund of Knowledge:unable to obtain  Language: unable to obtain  Akathisia: No  Handed: Right  AIMS (if indicated):    Assets: Others: unable to obtain  ADL's: Impaired  Cognition: WNL         Patient was seen today in her room still mute.  She is not talking and not eating or drinking.  Labs (BMP, CBC and CK) was done today.  Patient is not making any eye contact with staff and is not moving in the bed.  Patient looks emaciated.  Major depressive disorder, recurrent severe without psychotic features (HCC)   Plan:  Repeat cmp, cbc and ck was added.  We will continue to seek inpatient placement.  Patient was given a liter of NS today by EDP  Dahlia ByesJosephine Onuoha   PMHNP-BC  Agree with NP assessment and plan

## 2015-06-29 NOTE — Progress Notes (Signed)
Disposition CSW completed additional patient referrals to the following inpatient psych facilities:  [facility]  CSW will continue to follow patient for placement needs.  Seward SpeckLeo Purl Claytor [facility] Center - PrincetonCSW,LCAS Behavioral Health Disposition CSW [phone]

## 2015-06-29 NOTE — ED Notes (Signed)
MD at bedside.  Pt is nonverbal, will not eat, drink, or take meds Gave husbands phone number to NP, drew labs

## 2015-06-29 NOTE — ED Notes (Addendum)
Pt intermittently is in a catatonic state. She does mumble when asked questions. Pt did drink 240 cc of gatorade. She was placed in a hospital bed. Pt has rales on the left side and diminihsed breath sounds on the left. MD made aware that pts SATS today have been in the low 90's and day nurse placed pt on oxygen 2 liters. Portable x-ray done in the room at 7:45pm. MD made aware of elevated WBC count. Pt at this time does not appear in resp distress. (8pm)Pt does have a deep chest cough w/o production. 9pm _pt was given a duo neb due to coughing. She was also given an incentive spirometer. 9pm _pt is asleep but easily arousable.Respirations are regular and non labored. She remains on 2 liters nasal canula and HOB up almost 90 degrees.(10:15PM)

## 2015-06-30 DIAGNOSIS — F332 Major depressive disorder, recurrent severe without psychotic features: Secondary | ICD-10-CM | POA: Diagnosis not present

## 2015-06-30 LAB — CBG MONITORING, ED: Glucose-Capillary: 73 mg/dL (ref 65–99)

## 2015-06-30 LAB — AMMONIA: Ammonia: 40 umol/L — ABNORMAL HIGH (ref 9–35)

## 2015-06-30 MED ORDER — LACTULOSE 10 GM/15ML PO SOLN
30.0000 g | Freq: Once | ORAL | Status: AC
  Administered 2015-06-30: 30 g via ORAL
  Filled 2015-06-30: qty 45

## 2015-06-30 MED ORDER — LORAZEPAM 0.5 MG PO TABS
0.5000 mg | ORAL_TABLET | Freq: Two times a day (BID) | ORAL | Status: DC
  Administered 2015-06-30 – 2015-07-01 (×4): 0.5 mg via ORAL
  Filled 2015-06-30 (×4): qty 1

## 2015-06-30 MED ORDER — SODIUM CHLORIDE 0.9 % IV SOLN
INTRAVENOUS | Status: DC
  Administered 2015-06-30: 21:00:00 via INTRAVENOUS

## 2015-06-30 MED ORDER — DULOXETINE HCL 60 MG PO CPEP
60.0000 mg | ORAL_CAPSULE | Freq: Every day | ORAL | Status: DC
  Administered 2015-07-01: 60 mg via ORAL
  Filled 2015-06-30 (×2): qty 1

## 2015-06-30 MED ORDER — BUSPIRONE HCL 10 MG PO TABS
15.0000 mg | ORAL_TABLET | Freq: Two times a day (BID) | ORAL | Status: DC
  Administered 2015-06-30 – 2015-07-01 (×2): 15 mg via ORAL
  Filled 2015-06-30 (×2): qty 2

## 2015-06-30 MED ORDER — SODIUM CHLORIDE 0.9 % IV BOLUS (SEPSIS)
1000.0000 mL | Freq: Once | INTRAVENOUS | Status: AC
  Administered 2015-06-30: 1000 mL via INTRAVENOUS

## 2015-06-30 NOTE — ED Notes (Signed)
Bed: [id number] Expected date:  Expected time:  Means of arrival:  Comments: Room 31

## 2015-06-30 NOTE — Consult Note (Signed)
[name] Psych ED Progress Note  06/30/2015 11:20 AM Rachael Ramirez  MRN:  [id number] Subjective: depression, anxiety, catatonia Principal Problem: Major depressive disorder, recurrent severe without psychotic features ([place]) Diagnosis:   Patient Active Problem List   Diagnosis Date Noted  . Unspecified mood (affective) disorder ([place]) [F39] 06/28/2015  . Altered consciousness [R40.4]   . CAP (community acquired pneumonia) [J18.9] 01/07/2015  . Dehydration, mild [E86.0] 01/07/2015  . Tobacco abuse [Z72.0] 01/07/2015  . Acute asthmatic bronchitis [J45.901] 01/07/2015  . Major depressive disorder, recurrent severe without psychotic features ([place]) [F33.2]   . Overdose [T50.901A]   . Gait difficulty [R26.9] 02/11/2014  . Unresponsive episode [R40.4] 02/08/2014  . Acute back pain [M54.9] 09/23/2013  . Protein-calorie malnutrition, severe ([place]) [E43] 09/18/2013  . Acute respiratory failure with hypoxia ([place]) [J96.01] 09/18/2013  . Drug overdose [T50.901A] 09/14/2013  . History of suicide attempt [Z91.89] 09/14/2013  . Recurrent Major depressive disorder,severe [F32.9] 09/14/2013  . Anxiety [F41.9] 09/14/2013  . Compression fracture of L1 lumbar vertebra with delayed healing [S32.010G] 09/14/2013  . Lumbar herniated disc [M51.26] 09/14/2013  . Abnormal EKG [R94.31] 09/14/2013  . Suicide attempt by drug ingestion ([place]) [T50.902A] 09/14/2013  . Chronic low back pain [M54.5, G89.29] 08/22/2013  . HNP (herniated nucleus pulposus), lumbar [M51.26] 07/13/2013  . Bipolar disorder ([place]) [F31.9] 09/29/2012   Total Time spent with patient: 30 minutes  Past Psychiatric History: Bipolar depression, Anxiety disorder  Past Medical History:  Past Medical History  Diagnosis Date  . Back pain   . Asthma     uses Combivent daily as needed  . Insomnia     takes Trazodone nightly  . Anxiety     takes Klonopin daily as needed  . PONV (postoperative nausea and vomiting)   . Cough   . Spinal headache   .  Weakness     left leg  . GERD (gastroesophageal reflux disease)     takes Omeprazole daily  . Urinary urgency   . Bipolar 1 disorder ([place])     takes Lamictal daily  . Chronic low back pain     HNP and Radiculopathy  . MRSA (methicillin resistant Staphylococcus aureus)     Postive nasal swab  . Hypertension   . Collapsed lung 3/15    "while in hospital awaiting back OR"  . CAP (community acquired pneumonia) 01/07/2015    Past Surgical History  Procedure Laterality Date  . Percutaneous pinning Right 1992    "hand"  . Anterior cervical decomp/discectomy fusion  1996    "took bone off her hip"  . Elbow fracture surgery Left 1975  . Elbow fracture surgery Left 2002    "redo"  . Lumbar disc surgery  2008 X 2  . Posterior lumbar fusion  2008  . Hardware removal  2008    "took screw out of lower back"  . Epidural block injection    . Lumbar laminectomy/decompression microdiscectomy Left 07/13/2013    Procedure: Left Lumbar four-five microdiskectomy;  Surgeon: Winfield Cunas, MD;  Location: [place] NEURO ORS;  Service: Neurosurgery;  Laterality: Left;  Left Lumbar four-five microdiskectomy  . Lumbar wound debridement N/A 09/21/2013    Procedure: LUMBAR WOUND re-exploration and disectomy;  Surgeon: Winfield Cunas, MD;  Location: [place] NEURO ORS;  Service: Neurosurgery;  Laterality: N/A;  . Tonsillectomy    . Fracture surgery    . Back surgery     Family History:  Family History  Problem Relation Age of Onset  . Multiple sclerosis  Mother   . COPD Father   . Alcohol abuse Maternal Grandfather   . Alcohol abuse Brother   . Cirrhosis Brother    Family Psychiatric  History: Social History:  History  Alcohol Use No     History  Drug Use  . Yes    Comment: heroin    Social History   Social History  . Marital Status: Married    Spouse Name: N/A  . Number of Children: 2  . Years of Education: hs   Occupational History  . Disabled    Social History Main Topics  . Smoking status:  Current Every Day Smoker -- 1.00 packs/day for 18 years    Types: Cigarettes  . Smokeless tobacco: Never Used  . Alcohol Use: No  . Drug Use: Yes     Comment: heroin  . Sexual Activity: Yes   Other Topics Concern  . None   Social History Narrative    Sleep: Poor  Appetite:  Poor  Current Medications: Current Facility-Administered Medications  Medication Dose Route Frequency Provider Last Rate Last Dose  . acetaminophen (TYLENOL) tablet 650 mg  650 mg Oral Q4H PRN Davonna Belling, MD      . busPIRone (BUSPAR) tablet 15 mg  15 mg Oral BID Adan Beal      . [START ON 07/01/2015] DULoxetine (CYMBALTA) DR capsule 60 mg  60 mg Oral Daily Sophea Rackham      . ibuprofen (ADVIL,MOTRIN) tablet 600 mg  600 mg Oral Q8H PRN Davonna Belling, MD      . ipratropium-albuterol (DUONEB) 0.5-2.5 (3) MG/3ML nebulizer solution 3 mL  3 mL Inhalation Q6H PRN Davonna Belling, MD   3 mL at 06/29/15 2054  . LORazepam (ATIVAN) tablet 0.5 mg  0.5 mg Oral BID Minor Iden   0.5 mg at 06/30/15 1118  . ondansetron (ZOFRAN) tablet 4 mg  4 mg Oral Q8H PRN Davonna Belling, MD      . QUEtiapine (SEROQUEL) tablet 200 mg  200 mg Oral QHS Davonna Belling, MD   200 mg at 06/29/15 2053   Current Outpatient Prescriptions  Medication Sig Dispense Refill  . buprenorphine-naloxone (SUBOXONE) 8-2 MG SUBL SL tablet Place 1 tablet under the tongue every 12 (twelve) hours.    . busPIRone (BUSPAR) 15 MG tablet Take 2 tablets by mouth 2 (two) times daily. Prescribed 1 qd, pt states " i dont take it like that, maybe i take it twice a week"    . citalopram (CELEXA) 20 MG tablet Take 40 mg by mouth daily.    . clonazePAM (KLONOPIN) 0.5 MG tablet Take 0.5 mg by mouth 2 (two) times daily.     . COMBIVENT RESPIMAT 20-100 MCG/ACT AERS respimat Inhale 1-2 puffs into the lungs every 6 (six) hours as needed for wheezing or shortness of breath.   0  . DULoxetine (CYMBALTA) 30 MG capsule Take 30 mg by mouth daily.    Marland Kitchen NARCAN 4  MG/0.1ML LIQD USE 1 SPRAY INTO NOSTRIL WITH OVERDOSE  0  . ondansetron (ZOFRAN) 8 MG tablet Take 8 mg by mouth every 6 (six) hours as needed for vomiting.     . pregabalin (LYRICA) 300 MG capsule Take 1 capsule (300 mg total) by mouth at bedtime. (Patient taking differently: Take 300 mg by mouth every 12 (twelve) hours as needed (pain). )    . QUEtiapine (SEROQUEL) 200 MG tablet Take 200 mg by mouth at bedtime.    Marland Kitchen QUEtiapine (SEROQUEL) 25 MG tablet Take  25 mg by mouth 3 (three) times daily.    . Tapentadol HCl (NUCYNTA) 100 MG TABS Take 100 mg by mouth 5 (five) times daily.     Facility-Administered Medications Ordered in Other Encounters  Medication Dose Route Frequency Provider Last Rate Last Dose  . lactated ringers infusion    Continuous PRN Laretta Alstrom, CRNA        Lab Results:  Results for orders placed or performed during the hospital encounter of 06/27/15 (from the past 48 hour(s))  CBG monitoring, ED     Status: Abnormal   Collection Time: 06/29/15  6:22 AM  Result Value Ref Range   Glucose-Capillary 124 (H) 65 - 99 mg/dL  CBC with Differential     Status: Abnormal   Collection Time: 06/29/15 10:45 AM  Result Value Ref Range   WBC 15.0 (H) 4.0 - 10.5 K/uL   RBC 4.68 3.87 - 5.11 MIL/uL   Hemoglobin 14.7 12.0 - 15.0 g/dL   HCT 44.2 36.0 - 46.0 %   MCV 94.4 78.0 - 100.0 fL   MCH 31.4 26.0 - 34.0 pg   MCHC 33.3 30.0 - 36.0 g/dL   RDW 15.1 11.5 - 15.5 %   Platelets 472 (H) 150 - 400 K/uL   Neutrophils Relative % 81 %   Neutro Abs 12.2 (H) 1.7 - 7.7 K/uL   Lymphocytes Relative 13 %   Lymphs Abs 2.0 0.7 - 4.0 K/uL   Monocytes Relative 6 %   Monocytes Absolute 0.8 0.1 - 1.0 K/uL   Eosinophils Relative 0 %   Eosinophils Absolute 0.0 0.0 - 0.7 K/uL   Basophils Relative 0 %   Basophils Absolute 0.0 0.0 - 0.1 K/uL  Comprehensive metabolic panel     Status: Abnormal   Collection Time: 06/29/15 10:45 AM  Result Value Ref Range   Sodium 148 (H) 135 - 145 mmol/L   Potassium  3.7 3.5 - 5.1 mmol/L   Chloride 109 101 - 111 mmol/L   CO2 24 22 - 32 mmol/L   Glucose, Bld 120 (H) 65 - 99 mg/dL   BUN 32 (H) 6 - 20 mg/dL   Creatinine, Ser 0.73 0.44 - 1.00 mg/dL   Calcium 10.0 8.9 - 10.3 mg/dL   Total Protein 9.1 (H) 6.5 - 8.1 g/dL   Albumin 5.0 3.5 - 5.0 g/dL   AST 14 (L) 15 - 41 U/L   ALT 10 (L) 14 - 54 U/L   Alkaline Phosphatase 102 38 - 126 U/L   Total Bilirubin 0.9 0.3 - 1.2 mg/dL   GFR calc non Af Amer >60 >60 mL/min   GFR calc Af Amer >60 >60 mL/min    Comment: (NOTE) The eGFR has been calculated using the CKD EPI equation. This calculation has not been validated in all clinical situations. eGFR's persistently <60 mL/min signify possible Chronic Kidney Disease.    Anion gap 15 5 - 15  CK     Status: Abnormal   Collection Time: 06/29/15 10:45 AM  Result Value Ref Range   Total CK 21 (L) 38 - 234 U/L  CBG monitoring, ED     Status: None   Collection Time: 06/30/15 10:54 AM  Result Value Ref Range   Glucose-Capillary 73 65 - 99 mg/dL    Physical Findings: AIMS:  , ,  ,  ,    CIWA:    COWS:     Musculoskeletal: Strength & Muscle Tone: decreased Gait & Station: unsteady Patient leans: N/A  Psychiatric Specialty Exam: Review of Systems  Constitutional: Positive for malaise/fatigue.  HENT: Negative.   Eyes: Negative.   Respiratory: Negative.   Cardiovascular: Negative.   Gastrointestinal: Positive for nausea.  Genitourinary: Negative.   Musculoskeletal: Positive for myalgias.  Skin: Negative.   Neurological: Positive for weakness.  Endo/Heme/Allergies: Negative.   Psychiatric/Behavioral: Positive for depression. The patient is nervous/anxious.     Blood pressure 126/70, pulse 62, temperature 97.6 F (36.4 C), temperature source Oral, resp. rate 16, SpO2 96 %.There is no weight on file to calculate BMI.  General Appearance: Casual and hospital gown  Eye Contact::  Minimal  Speech:  Slow  Volume:  Decreased  Mood:  Anxious, Depressed  and Dysphoric  Affect:  Constricted  Thought Process:  Disorganized  Orientation:  Full (Time, Place, and Person)  Thought Content:  Rumination  Suicidal Thoughts:  No  Homicidal Thoughts:  No  Memory:  Immediate;   Fair Recent;   Fair Remote;   Fair  Judgement:  Impaired  Insight:  Shallow  Psychomotor Activity:  Decreased  Concentration:  Fair  Recall:  [place]: Fair  Akathisia:  No  Handed:  Right  AIMS (if indicated):     Assets:  Social Support  ADL's:  Intact  Cognition: WNL  Sleep:   poor    Patient seen, chart reviewed, case discussed with the treatment team. Patient was brought to the hospital with altered mental status, she has been mute for the past 2 days but has started communicating a bit today. Patient unable to recall the circumstances under which she was brought to the hospital. However, she reports poor appetite, being depressed, having low energy level, lack of motivation but denies suicidal thoughts. Patient denies AVH or delusional thinking. Patient reports history of Opioid use disorder and was on Suboxone until few days ago but patient has no sign of withdrawal symptoms. According to patient's husband his wife appeared to be doing well few hours before she was brought to the hospital.   Treatment Plan Summary: 1. Daily contact with patient to assess and evaluate symptoms and progress in treatment:  2. Medication management: -Decreased Seroquel to 291m Qhs for mood stabilization. -Discontinue Celexa  -Increase Cymbalta to 671mpo daily for depression. -Decrease Buspirone to 1526mid for anxiety.  3. Referral to Hospitalist for evaluation to rule out medical cause of Catatonia and lab review-elevated WBC  4. Patient will benefit from inpatient psychiatric admission when medically cleared.  AkiCorena PilgrimD 06/30/2015, 11:20 AM

## 2015-07-01 ENCOUNTER — Encounter (HOSPITAL_COMMUNITY): Payer: Self-pay | Admitting: General Practice

## 2015-07-01 ENCOUNTER — Inpatient Hospital Stay (HOSPITAL_COMMUNITY)
Admission: AD | Admit: 2015-07-01 | Discharge: 2015-07-03 | DRG: 885 | Disposition: A | Discharge status: Home / Self Care | Payer: 59 | Source: Intra-hospital | Attending: Psychiatry | Admitting: Psychiatry

## 2015-07-01 DIAGNOSIS — I10 Essential (primary) hypertension: Secondary | ICD-10-CM | POA: Diagnosis present

## 2015-07-01 DIAGNOSIS — K219 Gastro-esophageal reflux disease without esophagitis: Secondary | ICD-10-CM | POA: Diagnosis present

## 2015-07-01 DIAGNOSIS — F410 Panic disorder [episodic paroxysmal anxiety] without agoraphobia: Secondary | ICD-10-CM | POA: Diagnosis present

## 2015-07-01 DIAGNOSIS — F332 Major depressive disorder, recurrent severe without psychotic features: Principal | ICD-10-CM | POA: Diagnosis present

## 2015-07-01 DIAGNOSIS — F411 Generalized anxiety disorder: Secondary | ICD-10-CM | POA: Diagnosis present

## 2015-07-01 DIAGNOSIS — J45909 Unspecified asthma, uncomplicated: Secondary | ICD-10-CM | POA: Diagnosis present

## 2015-07-01 DIAGNOSIS — Z981 Arthrodesis status: Secondary | ICD-10-CM | POA: Diagnosis not present

## 2015-07-01 DIAGNOSIS — F1120 Opioid dependence, uncomplicated: Secondary | ICD-10-CM | POA: Diagnosis present

## 2015-07-01 DIAGNOSIS — F17210 Nicotine dependence, cigarettes, uncomplicated: Secondary | ICD-10-CM | POA: Diagnosis present

## 2015-07-01 DIAGNOSIS — Z825 Family history of asthma and other chronic lower respiratory diseases: Secondary | ICD-10-CM | POA: Diagnosis not present

## 2015-07-01 DIAGNOSIS — G4700 Insomnia, unspecified: Secondary | ICD-10-CM | POA: Diagnosis present

## 2015-07-01 DIAGNOSIS — Z820 Family history of epilepsy and other diseases of the nervous system: Secondary | ICD-10-CM

## 2015-07-01 LAB — AMMONIA: Ammonia: 18 umol/L (ref 9–35)

## 2015-07-01 MED ORDER — QUETIAPINE FUMARATE 200 MG PO TABS
200.0000 mg | ORAL_TABLET | Freq: Every day | ORAL | Status: DC
  Administered 2015-07-01 – 2015-07-02 (×2): 200 mg via ORAL
  Filled 2015-07-01 (×5): qty 1

## 2015-07-01 MED ORDER — ONDANSETRON HCL 4 MG PO TABS: 4.0000 mg | ORAL_TABLET | Freq: Three times a day (TID) | ORAL | Status: DC | PRN

## 2015-07-01 MED ORDER — IPRATROPIUM-ALBUTEROL 0.5-2.5 (3) MG/3ML IN SOLN: 3.0000 mL | Freq: Four times a day (QID) | RESPIRATORY_TRACT | Status: DC | PRN

## 2015-07-01 MED ORDER — DULOXETINE HCL 60 MG PO CPEP
60.0000 mg | ORAL_CAPSULE | Freq: Every day | ORAL | Status: DC
  Administered 2015-07-02 – 2015-07-03 (×2): 60 mg via ORAL
  Filled 2015-07-01 (×5): qty 1

## 2015-07-01 MED ORDER — LACTULOSE 10 GM/15ML PO SOLN
30.0000 g | Freq: Three times a day (TID) | ORAL | Status: DC
  Administered 2015-07-01 (×3): 30 g via ORAL
  Filled 2015-07-01 (×5): qty 45

## 2015-07-01 MED ORDER — BUSPIRONE HCL 15 MG PO TABS
15.0000 mg | ORAL_TABLET | Freq: Two times a day (BID) | ORAL | Status: DC
  Administered 2015-07-01 – 2015-07-03 (×4): 15 mg via ORAL
  Filled 2015-07-01 (×4): qty 1
  Filled 2015-07-01: qty 3
  Filled 2015-07-01 (×4): qty 1

## 2015-07-01 MED ORDER — LORAZEPAM 0.5 MG PO TABS
0.5000 mg | ORAL_TABLET | Freq: Two times a day (BID) | ORAL | Status: DC
  Administered 2015-07-01 – 2015-07-03 (×4): 0.5 mg via ORAL
  Filled 2015-07-01 (×4): qty 1

## 2015-07-01 MED ORDER — IBUPROFEN 600 MG PO TABS: 600.0000 mg | ORAL_TABLET | Freq: Three times a day (TID) | ORAL | Status: DC | PRN

## 2015-07-01 MED ORDER — LACTULOSE 10 GM/15ML PO SOLN
30.0000 g | Freq: Three times a day (TID) | ORAL | Status: DC
Start: 2015-07-01 — End: 2015-07-03
  Administered 2015-07-02: 30 g via ORAL
  Filled 2015-07-01 (×8): qty 45

## 2015-07-01 MED ORDER — ACETAMINOPHEN 325 MG PO TABS: 650.0000 mg | ORAL_TABLET | ORAL | Status: DC | PRN

## 2015-07-01 NOTE — ED Provider Notes (Signed)
00:50- I received a call from the psychiatric day, at 1800 hrs. She was concerned about encephalopathy, so ordered an ammonia level. Ammonia was slightly elevated. Patient also somewhat dehydrated based on laboratory work. I ordered lactulose, and IV fluids. At this time. The patient is alert, conversant and in no apparent distress. Repeat ammonia level ordered for morning. Have ordered every 8 hour lactulose. Patient should still be considered as a psychiatric hold Patient, and placed as previously planned.   Mancel BaleElliott Nashly Olsson, MD 07/01/15 [phone]

## 2015-07-01 NOTE — Progress Notes (Signed)
[facility]  [facility]  Patient's belongings were given to [facility] and patient left ambulatory without incident.

## 2015-07-01 NOTE — Tx Team (Signed)
Initial Interdisciplinary Treatment Plan   PATIENT STRESSORS: Substance abuse   PATIENT STRENGTHS: Communication skills General fund of knowledge   PROBLEM LIST: Problem List/Patient Goals Date to be addressed Date deferred Reason deferred Estimated date of resolution  Suboxone Use  07/01/2015           "I need sleep" 07/01/2015           No other goal identified                               DISCHARGE CRITERIA:  Improved stabilization in mood, thinking, and/or behavior Withdrawal symptoms are absent or subacute and managed without 24-hour nursing intervention  PRELIMINARY DISCHARGE PLAN: Attend PHP/IOP Outpatient therapy  PATIENT/FAMIILY INVOLVEMENT: This treatment plan has been presented to and reviewed with the patient, Cyndy FreezeRachel Myer.  The patient and family have been given the opportunity to ask questions and make suggestions.  Marzetta BoardDopson, Deston Bilyeu E 07/01/2015, 6:47 PM

## 2015-07-01 NOTE — ED Notes (Signed)
Bed: [facility]40 Expected date:  Expected time:  Means of arrival:  Comments: RM 18 

## 2015-07-01 NOTE — ED Notes (Signed)
Report given to Christa D., RN.  [facility]

## 2015-07-01 NOTE — BH Assessment (Signed)
BHH Assessment Progress Note  Per Thedore MinsMojeed Akintayo, MD, this pt requires psychiatric hospitalization at this time.  Berneice Heinrichina Tate, RN, [facility]  Pt has signed Voluntary Admission and Consent for Treatment, as well as Consent to Release Information, and signed forms [facility].  Pt's nurse, Rayfield CitizenCaroline, has been notified, and agrees to send original paperwork along with pt via [facility], and to call report to [phone].  Doylene Canninghomas Nayan Proch, MA Triage Specialist [phone]

## 2015-07-01 NOTE — BH Assessment (Signed)
Writer reviewing chart for [facility] BH placement consideration.  

## 2015-07-01 NOTE — Progress Notes (Signed)
Patient ID: Rachael Ramirez, female   DOB: 06/25/1971, 45 y.o.   MRN: [id number]  Rachael Ramirez is a 45 year old caucasian [facility] coming to ED with psychomotor retardation and eventually intermittent catatonia. Patient reports, "I don't know what happened" when inquired about how she came to be [facility]. She reports only prescription drug she has been taking, other than Klonopin, that she believes may have caused it is Suboxone. She reports, "I receive it from Dr. Loraine LericheMark." When inquired about it patient would not elaborate further instead she stated, "we are taking care of him." Patient denies SI/HI and A/V hallucinations. She denies any pain at this time. Patient has extensive past medical history please see consult for complete list. She at times can be confused, "stating why am I here?" Patient does report that after taking Lactulose patient has been having diarrhea. Patient encouraged to drink plenty of fluids and was given a meal after admission process and orientation to the unit. Q15 minute safety checks initiated and are maintained.

## 2015-07-01 NOTE — ED Notes (Signed)
Patient states she has been prescribed suboxone "a lot of it."  She denies SI/HI/AVH.  She states, "I really don't feel well."  Explained to patient that her amonia level was elevated when she came in.  Also explained to patient that she will be going to 300 hall.

## 2015-07-02 ENCOUNTER — Encounter (HOSPITAL_COMMUNITY): Payer: Self-pay | Admitting: Psychiatry

## 2015-07-02 DIAGNOSIS — F411 Generalized anxiety disorder: Secondary | ICD-10-CM | POA: Diagnosis present

## 2015-07-02 DIAGNOSIS — F332 Major depressive disorder, recurrent severe without psychotic features: Principal | ICD-10-CM

## 2015-07-02 DIAGNOSIS — F1120 Opioid dependence, uncomplicated: Secondary | ICD-10-CM

## 2015-07-02 MED ORDER — ENSURE ENLIVE PO LIQD
237.0000 mL | Freq: Two times a day (BID) | ORAL | Status: DC
  Administered 2015-07-02 (×2): 237 mL via ORAL

## 2015-07-02 NOTE — Tx Team (Signed)
Interdisciplinary Treatment Plan Update (Adult)  Date:  07/02/2015  Time Reviewed:  8:37 AM   Progress in Treatment: Attending groups: No. Participating in groups:  No. Taking medication as prescribed:  Yes. Tolerating medication:  Yes. Family/Significant othe contact made:  SPE required for this pt. Collateral contact would be helpful as well (from husband).  Patient understands diagnosis:  No. pt lacks insight at this time; confused; poor historian.  Discussing patient identified problems/goals with staff:  Yes. Medical problems stabilized or resolved:  Yes. Denies suicidal/homicidal ideation: Yes. Issues/concerns per patient self-inventory:  Other:  Discharge Plan or Barriers: CSW assessing. Pt lives at home with her husband. Has been going to [facility] for suboxone and mental health medications. Husband thinks she is having bad reaction to medication.  Reason for Continuation of Hospitalization: Depression Medication stabilization Withdrawal symptoms  Comments:  Rachael Ramirez is an 45 y.o. female who presents voluntarily to [facility] with her husband. Patient refused to participate in assessment and did not answer any questions.  He states that the patients mother lives near by and went to check on her due to her not answering the phone. He states that the patients mother called him at work and states "she's just sitting in a stare." Patients husband states that when he got home from work the patient was on the couch and was unresponsive. He states that he does not know what may have triggered this event. He states that the patients "goes to behavioral health on [facility] to get her Suboxone and klonopin" but he is not sure if she takes her medications as prescribed. He states that her medication changed about one month ago but he is not sure what she was previously on or what it is currently. Patients husband states that the patient has had several back surgeries and was addicted to  pain medications until about two years ago when she started using heroin. He states that she has been off of heroin and going to the suboxone clinic but is being "weaned off" and is now prescribed two per day and gets a weeks worth of suboxone at a time. He states that she has been off of heroin and on suboxone for the past three weeks. He states that the patient has been nauseous and has vomited three times since yesterday and he is not sure why. He states that the patient has a history of seizures "where she just sits still and blinks but nothing like this."    Estimated length of stay:  3-5 days   New goal(s): to develop effective aftercare plan.   Additional Comments:  Patient and CSW reviewed pt's identified goals and treatment plan. Patient verbalized understanding and agreed to treatment plan. CSW reviewed [facility] "Discharge Process and Patient Involvement" Form. Pt verbalized understanding of information provided and signed form.    Review of initial/current patient goals per problem list:  1. Goal(s): Patient will participate in aftercare plan  Met: No.   Target date: at discharge  As evidenced by: Patient will participate within aftercare plan AEB aftercare provider and housing plan at discharge being identified.  07/02/15: CSW assessing for appropriate referrals. Hx of medication management/suboxone maintenance for past 3 weeks at [facility].   2. Goal (s): Patient will exhibit decreased depressive symptoms and suicidal ideations.  Met: No.    Target date: at discharge  As evidenced by: Patient will utilize self rating of depression at 3 or below and demonstrate decreased signs of depression or  be deemed stable for discharge by MD.  07/02/15: Pt continues to present with depressed mood; demonstrating some confusion and lack of insight.   3. Goal(s): Patient will demonstrate decreased signs of withdrawal due to substance abuse  Met:No.   Target date:at discharge    As evidenced by: Patient will produce a CIWA/COWS score of 0, have stable vitals signs, and no symptoms of withdrawal.  07/02/15: Pt reports no signs of withdrawal; no CIWA/COWS score taken. High BP. Goal progressing.   Attendees: Patient:   07/02/2015 8:37 AM   Family:   07/02/2015 8:37 AM   Physician:  Dr. Carlton Adam, MD 07/02/2015 8:37 AM   Nursing:   Charise Carwin RN 07/02/2015 8:37 AM   Clinical Social Worker: Maxie Better, LCSW 07/02/2015 8:37 AM   Clinical Social Worker: Erasmo Downer Drinkard LCSWA; Peri Maris LCSWA 07/02/2015 8:37 AM   Other:  Gerline Legacy Nurse Case Manager 07/02/2015 8:37 AM   Other:  07/02/2015 8:37 AM   Other:   07/02/2015 8:37 AM   Other:  07/02/2015 8:37 AM   Other:  07/02/2015 8:37 AM   Other:  07/02/2015 8:37 AM    07/02/2015 8:37 AM    07/02/2015 8:37 AM    07/02/2015 8:37 AM    07/02/2015 8:37 AM    Scribe for Treatment Team:   Maxie Better, LCSW 07/02/2015 8:37 AM

## 2015-07-02 NOTE — Progress Notes (Deleted)
D   Pt is anxious and depressed   She continues to complain of throat pain and general discomfort    She interacts well with others and is compliant with treatment A   Verbal support given   Medications administered and effectiveness monitored  Q 15 min checks R   Pt safe at present

## 2015-07-02 NOTE — B[facility] Group Notes (Signed)
B[facility] LCSW Group Therapy  07/02/2015 3:46 PM  Type of Therapy:  Group Therapy  Participation Level:  Did Not Attend-pt invited. Chose to rest in bed.   Summary of Progress/Problems: Emotion Regulation: This group focused on both positive and negative emotion identification and allowed group members to process ways to identify feelings, regulate negative emotions, and find healthy ways to manage internal/external emotions. Group members were asked to reflect on a time when their reaction to an emotion led to a negative outcome and explored how alternative responses using emotion regulation would have benefited them. Group members were also asked to discuss a time when emotion regulation was utilized when a negative emotion was experienced.   Smart, Rachael Venters LCSW 07/02/2015, 3:46 PM

## 2015-07-02 NOTE — Progress Notes (Signed)
NUTRITION ASSESSMENT  Pt identified as at risk on the Malnutrition Screen Tool  INTERVENTION: 1. Educated patient on the importance of nutrition and encouraged intake of food and beverages. 2. Discussed weight goals. 3. Supplements: will order Ensure Enlive po BID, each supplement provides 350 kcal and 20 grams of protein   NUTRITION DIAGNOSIS: Inadequate oral intake related to current medical state as evidenced by notes indicating pt refusing to eat and drink.  Goal: Pt to meet >/= 90% of their estimated nutrition needs.  Monitor:  PO intake  Assessment:  Pt seen for MST. She has been in an intermittent catatonic state since admission, per notes. Per chart review, pt has gained 11 pounds in the past 6 months. Notes from admission to the ED state that pt was refusing to eat and drink on admission. Pt slightly more alert now but still remains isolated and withdrawn. Will trial Ensure to assist.  45 y.o. female  Height: Ht Readings from Last 1 Encounters:  07/01/15 5\' 2"  (1.575 m)    Weight: Wt Readings from Last 1 Encounters:  07/01/15 113 lb 8 oz (51.483 kg)    Weight Hx: Wt Readings from Last 10 Encounters:  07/01/15 113 lb 8 oz (51.483 kg)  01/07/15 102 lb 4.8 oz (46.403 kg)  11/08/14 107 lb (48.535 kg)  02/12/14 100 lb 1.4 oz (45.4 kg)  02/06/14 99 lb (44.906 kg)  02/05/14 99 lb (44.906 kg)  02/03/14 99 lb (44.906 kg)  01/10/14 98 lb (44.453 kg)  09/19/13 92 lb 13 oz (42.1 kg)  08/23/13 100 lb (45.36 kg)    BMI:  Body mass index is 20.75 kg/(m^2). Pt meets criteria for normal weight based on current BMI.  Estimated Nutritional Needs: Kcal: 25-30 kcal/kg Protein: > 1 gram protein/kg Fluid: 1 ml/kcal  Diet Order:   Pt is also offered choice of unit snacks mid-morning and mid-afternoon.  Pt is eating as desired.   Lab results and medications reviewed.      Trenton GammonJessica Hennesy Sobalvarro, RD, LDN Inpatient Clinical Dietitian Pager # [phone] After hours/weekend  pager # [phone]

## 2015-07-02 NOTE — [facility] Group Notes (Signed)
[facility]   07/02/2015 11:07 AM  Participation Quality:  Invited. Did not attend. Pt chose to remain in bed.   Smart, HeatherLCSW

## 2015-07-02 NOTE — Progress Notes (Signed)
D    Pt isolated to her room   She reports some mild to moderate withdrawal symptoms   She admits to some depression and anxiety    A   Verbal support given   Medications administered and effectiveness monitored   Q 15 min checks R   Pt safe at present

## 2015-07-02 NOTE — BHH Counselor (Signed)
Adult Comprehensive Assessment  Patient ID: Rachael FreezeRachel Ramirez, female   DOB: 12/10/1970, 45 y.o.   MRN: [id number]  Information Source: Information source: Patient  Current Stressors:  Educational / Learning stressors: High school graduate Employment / Job issues: on disability for past few years due to multiple back surgeries  Family Relationships: good relationship with husband, 2 adult children and grandchild Surveyor, quantityinancial / Lack of resources (include bankruptcy): disability income; husband's income Housing / Lack of housing: lives in home with husband for past 26 years/ [place],  Physical health (include injuries & life threatening diseases): degenerative disc disease-7 back surgeries.  Living/Environment/Situation:  Living Arrangements: Spouse/significant other  Family History:  Are you sexually active?: Yes What is your sexual orientation?: heterosexual Has your sexual activity been affected by drugs, alcohol, medication, or emotional stress?: no  Does patient have children?: Yes How many children?: 2 How is patient's relationship with their children?: 47 year old daughter and 47 year old son. good relationship with both. close to her grandchild as well.   Childhood History:  By whom was/is the patient raised?: Both parents Additional childhood history information: parents are married; pt's father "functioning alcoholic" but "it didn't affect our famiily." hx of substance abuse/alcoholism on mother's side of family. Description of patient's relationship with caregiver when they were a child: close to both parents Patient's description of current relationship with people who raised him/her: close to both parents  How were you disciplined when you got in trouble as a child/adolescent?: n/a  Does patient have siblings?: Yes Number of Siblings: 2 Description of patient's current relationship with siblings: both brother and sister struggle with substance abuse issues. brother has  mental illness/depression.  Did patient suffer any verbal/emotional/physical/sexual abuse as a child?: No Did patient suffer from severe childhood neglect?: No Has patient ever been sexually abused/assaulted/raped as an adolescent or adult?: No Was the patient ever a victim of a crime or a disaster?: No Witnessed domestic violence?: No Has patient been effected by domestic violence as an adult?: No  Education:  Highest grade of school patient has completed: high school graduate  Currently a student?: No Learning disability?: No  Employment/Work Situation:   Employment situation: On disability Why is patient on disability: degenerative back disease-7 prior back surgeries.  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  Has patient ever been in the Eli Lilly and Companymilitary?: No Has patient ever served in combat?: No Did You Receive Any Psychiatric Treatment/Services While in Equities traderthe Military?: No Are There Guns or Other Weapons in Your Home?: No Are These ComptrollerWeapons Safely Secured?: No Who Could Verify You Are Able To Have These Secured:: n/a   Financial Resources:   Financial resources: [employer] Corporationeceives SSDI, [employer] insurance, Income from spouse Does patient have a Lawyerrepresentative payee or guardian?: No  Alcohol/Substance Abuse:   What has been your use of drugs/alcohol within the last 12 months?: pt reports becoming addicted to pain medication after multiple back surgeries. Pt transitioned to heroin about 2 years ago (snort) not IV, and 3 weeks ago was placed on [place] [facility] [place]  If attempted suicide, did drugs/alcohol play a role in this?: Yes (OD on medication a few years ago "I was tired of being addicted to opiates." ) Alcohol/Substance Abuse Treatment Hx: Past Tx, Inpatient, Past  detox, Past Tx, Outpatient If yes, describe treatment: [facility] "about a year ago for Si"; [facility] for [facility] in the past. most recenlty suboxone maintainence and med management through [facility].  Has alcohol/substance abuse ever caused legal problems?: No (pt has court date for "driving with revoked license" "sometimes in February." )  Social Support System:   Patient's Community Support System: Fair Museum/gallery exhibitions officer System: some close friends; very supportive family/husband/kids Type of faith/religion: christian How does patient's faith help to cope with current illness?: prayer  Leisure/Recreation:   Leisure and Hobbies: spending time with her grandchild  Strengths/Needs:   What things does the patient do well?: motivated to get off opiates and suboxone.  In what areas does patient struggle / problems for patient: pain management; confusion/withdrawals   Discharge Plan:   Does patient have access to transportation?: Yes (pt has revoked license at this time. husband transports her to appts) Will patient be returning to same living situation after discharge?: Yes (home with husband) Currently receiving community mental health services: Yes (From Whom) ([facility]) If no, would patient like referral for services when discharged?: Yes (What county?) (Pt wants referral to another psychiatrist/counselor-possibly [place] [facility]/CSW assessing) Does patient have financial barriers related to discharge medications?: No Counselling psychologist insurance and income)  Summary/Recommendations:    Pt is 45 year old female living in [place], [place] with her husband. 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 Pt reports that she thinks the mental health medications and suboxone combination "made me confused; out of my mind."  Pt's husband found her in a daze at their home and took her to the hospital for assessment. Pt reports that she would like to get off suboxone and would like a referral to another provider for psychiatry and counseling. Pt reports hx of depression and diagnosis of bipolar disorder, although she does not agree with the ladder diagnosis. Recommendations of pt include: crisis stabilization, therapeutic milieu, encourage group attendance and participation, medication management for mood stabilization, and development of comprehensive mental wellness/sobriety plan. Pt plans to return home at d/c with her husband. CSW assessing for appropriate referrals. Pt is on disability due to degenerative disc disease.   Smart, Beyonca Wisz LCSW 07/02/2015 10:43 AM

## 2015-07-02 NOTE — H&P (Signed)
Psychiatric Admission Assessment Adult  Patient Identification: Rachael Ramirez MRN:  [id number] Date of Evaluation:  07/02/2015 Chief Complaint:  MDD RECURRENT SEVERE WITHOUT psychotic features Principal Diagnosis: <principal problem not specified> Diagnosis:   Patient Active Problem List   Diagnosis Date Noted  . MDD (major depressive disorder), recurrent episode, severe (HCC) [F33.2] 07/01/2015  . Unspecified mood (affective) disorder (HCC) [F39] 06/28/2015  . Altered consciousness [R40.4]   . CAP (community acquired pneumonia) [J18.9] 01/07/2015  . Dehydration, mild [E86.0] 01/07/2015  . Tobacco abuse [Z72.0] 01/07/2015  . Acute asthmatic bronchitis [J45.901] 01/07/2015  . Major depressive disorder, recurrent severe without psychotic features (HCC) [F33.2]   . Overdose [T50.901A]   . Gait difficulty [R26.9] 02/11/2014  . Unresponsive episode [R40.4] 02/08/2014  . Acute back pain [M54.9] 09/23/2013  . Protein-calorie malnutrition, severe (HCC) [E43] 09/18/2013  . Acute respiratory failure with hypoxia (HCC) [J96.01] 09/18/2013  . Drug overdose [T50.901A] 09/14/2013  . History of suicide attempt [Z91.89] 09/14/2013  . Recurrent Major depressive disorder,severe [F32.9] 09/14/2013  . Anxiety [F41.9] 09/14/2013  . Compression fracture of L1 lumbar vertebra with delayed healing [S32.010G] 09/14/2013  . Lumbar herniated disc [M51.26] 09/14/2013  . Abnormal EKG [R94.31] 09/14/2013  . Suicide attempt by drug ingestion (HCC) [T50.902A] 09/14/2013  . Chronic low back pain [M54.5, G89.29] 08/22/2013  . HNP (herniated nucleus pulposus), lumbar [M51.26] 07/13/2013  . Bipolar disorder (HCC) [F31.9] 09/29/2012   History of Present Illness:: 45 Y/O female who states she has had multiple back surgeries. States the opioids were not curbing the pain.  She used more and more. Started snorting heroin.. States that eventually she was placed on Suboxone, up to 24 mg daily. She has alos been using Klonopin  1 mg. Admits that she might have taken more of the Klonopin. States she States she has been diagnosed with Bipolar although she is not sure of that diagnosis. She does suffer from depression and anxiety. She does not remember what happened. She knows that she was admitted to the medical unit after she did not "communicate." she does not think she took more Suboxone but thinks she took more Klonopin. Denies that this was an OD and attempt to hurt herself.  The initial assessment is as follows: Patients husband states that the patient became unresponsive yesterday around 3:00PM. He states that the patients mother lives near by and went to check on her due to her not answering the phone. He states that the patients mother called him at work and states "she's just sitting in a stare." Patients husband states that when he got home from work the patient was on the couch and was unresponsive. He states that he does not know what may have triggered this event. He states that the patients "goes to behavioral health on [facility] to get her Suboxone and klonopin" but he is not sure if she takes her medications as prescribed. He states that her medication changed about one month ago but he is not sure what she was previously on or what it is currently. Patients husband states that the patient has had several back surgeries and was addicted to pain medications until about two years ago when she started using heroin. He states that she has been off of heroin and going to the suboxone clinic but is being "weaned off" and is now prescribed two per day and gets a weeks worth of suboxone at a time. He states that she has been off of heroin and on suboxone for the  past three weeks. He states that the patient has been nauseous and has vomited three times since yesterday and he is not sure why. He states that the patient has a history of seizures "where she just sits still and blinks but nothing like this."   Associated  Signs/Symptoms: Depression Symptoms:  depressed mood, anhedonia, fatigue, anxiety, panic attacks, loss of energy/fatigue, disturbed sleep, weight loss, (Hypo) Manic Symptoms:  denies Anxiety Symptoms:  Excessive Worry, Panic Symptoms, Psychotic Symptoms:  denies PTSD Symptoms: Negative Total Time spent with patient: 45 minutes  Past Psychiatric History:   Risk to Self: Is patient at risk for suicide?: No Risk to Others:  No Prior Inpatient Therapy:  was admitted to [facility] in the past suicidal attempt tired of feeling the way she was feeling. She was under the influence Prior Outpatient Therapy:  used to see [facility] then [facility]   Alcohol Screening: 1. How often do you have a drink containing alcohol?: Never 9. Have you or someone else been injured as a result of your drinking?: No 10. Has a relative or friend or a doctor or another health worker been concerned about your drinking or suggested you cut down?: No Alcohol Use Disorder Identification Test Final Score (AUDIT): 0 Brief Intervention: AUDIT score less than 7 or less-screening does not suggest unhealthy drinking-brief intervention not indicated Substance Abuse History in the last 12 months:  Yes.   Consequences of Substance Abuse: Negative Previous Psychotropic Medications: Yes Lamictal Seroquel Klonopin  Psychological Evaluations: No  Past Medical History:  Past Medical History  Diagnosis Date  . Back pain   . Asthma     uses Combivent daily as needed  . Insomnia     takes Trazodone nightly  . Anxiety     takes Klonopin daily as needed  . PONV (postoperative nausea and vomiting)   . Cough   . Spinal headache   . Weakness     left leg  . GERD (gastroesophageal reflux disease)     takes Omeprazole daily  . Urinary urgency   . Bipolar 1 disorder (HCC)     takes Lamictal daily  . Chronic low back pain     HNP and Radiculopathy  . MRSA (methicillin resistant Staphylococcus aureus)      Postive nasal swab  . Hypertension   . Collapsed lung 3/15    "while in hospital awaiting back OR"  . CAP (community acquired pneumonia) 01/07/2015    Past Surgical History  Procedure Laterality Date  . Percutaneous pinning Right 1992    "hand"  . Anterior cervical decomp/discectomy fusion  1996    "took bone off her hip"  . Elbow fracture surgery Left 1975  . Elbow fracture surgery Left 2002    "redo"  . Lumbar disc surgery  2008 X 2  . Posterior lumbar fusion  2008  . Hardware removal  2008    "took screw out of lower back"  . Epidural block injection    . Lumbar laminectomy/decompression microdiscectomy Left 07/13/2013    Procedure: Left Lumbar four-five microdiskectomy;  Surgeon: Carmela Hurt, MD;  Location: [facility];  Service: Neurosurgery;  Laterality: Left;  Left Lumbar four-five microdiskectomy  . Lumbar wound debridement N/A 09/21/2013    Procedure: LUMBAR WOUND re-exploration and disectomy;  Surgeon: Carmela Hurt, MD;  Location: [facility];  Service: Neurosurgery;  Laterality: N/A;  . Tonsillectomy    . Fracture surgery    . Back surgery  Family History:  Family History  Problem Relation Age of Onset  . Multiple sclerosis Mother   . COPD Father   . Alcohol abuse Maternal Grandfather   . Alcohol abuse Brother   . Cirrhosis Brother    Family Psychiatric  History: Sister and brother Bipolar alcohol abuse as above Social History:  History  Alcohol Use No     History  Drug Use  . Yes    Comment: heroin    Social History   Social History  . Marital Status: Married    Spouse Name: N/A  . Number of Children: 2  . Years of Education: hs   Occupational History  . Disabled    Social History Main Topics  . Smoking status: Current Every Day Smoker -- 1.00 packs/day for 18 years    Types: Cigarettes  . Smokeless tobacco: Never Used  . Alcohol Use: No  . Drug Use: Yes     Comment: heroin  . Sexual Activity: Yes   Other Topics Concern  .  None   Social History Narrative  On disability for her back. 12 th grade started working on health insurance until her disability. Married has 2 kids 24,26 Additional Social History:                         Allergies:   Allergies  Allergen Reactions  . Minocycline Nausea And Vomiting  . Tramadol Nausea And Vomiting   Lab Results:  Results for orders placed or performed during the hospital encounter of 06/27/15 (from the past 48 hour(s))  Ammonia     Status: Abnormal   Collection Time: 06/30/15 10:53 AM  Result Value Ref Range   Ammonia 40 (H) 9 - 35 umol/L  CBG monitoring, ED     Status: None   Collection Time: 06/30/15 10:54 AM  Result Value Ref Range   Glucose-Capillary 73 65 - 99 mg/dL  Ammonia     Status: None   Collection Time: 07/01/15  9:32 AM  Result Value Ref Range   Ammonia 18 9 - 35 umol/L    Metabolic Disorder Labs:  No results found for: HGBA1C, MPG No results found for: PROLACTIN No results found for: CHOL, TRIG, HDL, CHOLHDL, VLDL, LDLCALC  Current Medications: Current Facility-Administered Medications  Medication Dose Route Frequency Provider Last Rate Last Dose  . acetaminophen (TYLENOL) tablet 650 mg  650 mg Oral Q4H PRN Earney NavyJosephine C Onuoha, NP      . busPIRone (BUSPAR) tablet 15 mg  15 mg Oral BID Earney NavyJosephine C Onuoha, NP   15 mg at 07/02/15 0852  . DULoxetine (CYMBALTA) DR capsule 60 mg  60 mg Oral Daily Earney NavyJosephine C Onuoha, NP   60 mg at 07/02/15 0852  . feeding supplement (ENSURE ENLIVE) (ENSURE ENLIVE) liquid 237 mL  237 mL Oral BID BM Anderson MaltaJessica M Ostheim, RD      . ibuprofen (ADVIL,MOTRIN) tablet 600 mg  600 mg Oral Q8H PRN Earney NavyJosephine C Onuoha, NP      . ipratropium-albuterol (DUONEB) 0.5-2.5 (3) MG/3ML nebulizer solution 3 mL  3 mL Inhalation Q6H PRN Earney NavyJosephine C Onuoha, NP      . lactulose (CHRONULAC) 10 GM/15ML solution 30 g  30 g Oral 3 times per day Earney NavyJosephine C Onuoha, NP   30 g at 07/01/15 2206  . LORazepam (ATIVAN) tablet 0.5 mg  0.5 mg  Oral BID Earney NavyJosephine C Onuoha, NP   0.5 mg at 07/02/15 0852  . ondansetron (  ZOFRAN) tablet 4 mg  4 mg Oral Q8H PRN Earney Navy, NP      . QUEtiapine (SEROQUEL) tablet 200 mg  200 mg Oral QHS Earney Navy, NP   200 mg at 07/01/15 2204   Facility-Administered Medications Ordered in Other Encounters  Medication Dose Route Frequency Provider Last Rate Last Dose  . lactated ringers infusion    Continuous PRN Jeani Hawking, CRNA       PTA Medications: Prescriptions prior to admission  Medication Sig Dispense Refill Last Dose  . busPIRone (BUSPAR) 15 MG tablet Take 2 tablets by mouth 2 (two) times daily. Prescribed 1 qd, pt states " i dont take it like that, maybe i take it twice a week"   Past Week at Unknown time  . citalopram (CELEXA) 20 MG tablet Take 40 mg by mouth daily.   06/26/2015 at Unknown time  . clonazePAM (KLONOPIN) 0.5 MG tablet Take 0.5 mg by mouth 2 (two) times daily.    Past Week at Unknown time  . COMBIVENT RESPIMAT 20-100 MCG/ACT AERS respimat Inhale 1-2 puffs into the lungs every 6 (six) hours as needed for wheezing or shortness of breath.   0 Past Week at Unknown time  . DULoxetine (CYMBALTA) 30 MG capsule Take 30 mg by mouth daily.   06/23/2015 at Unknown time  . NARCAN 4 MG/0.1ML LIQD USE 1 SPRAY INTO NOSTRIL WITH OVERDOSE  0 unknown  . ondansetron (ZOFRAN) 8 MG tablet Take 8 mg by mouth every 6 (six) hours as needed for vomiting.    unknown  . QUEtiapine (SEROQUEL) 200 MG tablet Take 200 mg by mouth at bedtime.   Past Week at Unknown time    Musculoskeletal: Strength & Muscle Tone: within normal limits Gait & Station: normal Patient leans: normal  Psychiatric Specialty Exam: Physical Exam  Review of Systems  Constitutional: Positive for weight loss and malaise/fatigue.  HENT: Negative.   Eyes: Negative.   Respiratory: Positive for cough.        Almost a pack a day  Cardiovascular: Negative.   Gastrointestinal: Positive for diarrhea.  Genitourinary:  Negative.   Musculoskeletal: Positive for back pain.  Skin: Negative.   Neurological: Positive for weakness.  Endo/Heme/Allergies: Negative.   Psychiatric/Behavioral: Positive for substance abuse. The patient is nervous/anxious.     Blood pressure 99/63, pulse 106, temperature 98.4 F (36.9 C), temperature source Oral, resp. rate 16, height 5\' 2"  (1.575 m), weight 51.483 kg (113 lb 8 oz), SpO2 92 %.Body mass index is 20.75 kg/(m^2).  General Appearance: Fairly Groomed  Patent attorney::  Fair  Speech:  Clear and Coherent  Volume:  Normal  Mood:  Anxious and Depressed  Affect:  sad anxious worried  Thought Process:  Coherent and Goal Directed  Orientation:  Full (Time, Place, and Person)  Thought Content:  symptoms events worries concerns  Suicidal Thoughts:  No  Homicidal Thoughts:  No  Memory:  Immediate;   Fair Recent;   Fair Remote;   Fair  Judgement:  Fair  Insight:  Present and Shallow  Psychomotor Activity:  Decreased  Concentration:  Fair  Recall:  Poor  Fund of Knowledge:Fair  Language: Fair  Akathisia:  No  Handed:  Right  AIMS (if indicated):     Assets:  Desire for Improvement Housing Social Support  ADL's:  Intact  Cognition: WNL  Sleep:  Number of Hours: 7     Treatment Plan Summary: Daily contact with patient to assess and evaluate symptoms  and progress in treatment and Medication management Supportive approach/coping skills Opioid dependence: monitor for detox needs/work a relapse prevention plan Depression; continue the Cymbalta at 60 mg Anxiety; continue the Buspar 15 mg BID 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   Armin Yerger A 1/4/201710:06 AM

## 2015-07-02 NOTE — Progress Notes (Signed)
D-  Patient has been in her room for the majority of the shift.  Patient states that she is not having any SI, HI or AVH.  Patient has reported that she is going to discharge tomorrow and she is looking forward to returning home.  Patient has been compliant with medications.  A- assess patient for safety, offer medications as prescribed, engage patient in 1:1 staff talks   R-Patient able to contract for safety.

## 2015-07-02 NOTE — [facility] Suicide Risk Assessment (Signed)
[facility]   Nursing information obtained from:  Patient Demographic factors:  Caucasian Current Mental Status:  NA Loss Factors:  NA Historical Factors:  Impulsivity Risk Reduction Factors:  Living with another person, especially a relative Total Time spent with patient: 45 minutes Principal Problem: MDD (major depressive disorder), recurrent episode, severe (HCC) Diagnosis:   Patient Active Problem List   Diagnosis Date Noted  . Opioid type dependence, continuous (HCC) [F11.20] 07/02/2015  . Generalized anxiety disorder [F41.1] 07/02/2015  . MDD (major depressive disorder), recurrent episode, severe (HCC) [F33.2] 07/01/2015  . Unspecified mood (affective) disorder (HCC) [F39] 06/28/2015  . Altered consciousness [R40.4]   . CAP (community acquired pneumonia) [J18.9] 01/07/2015  . Dehydration, mild [E86.0] 01/07/2015  . Tobacco abuse [Z72.0] 01/07/2015  . Acute asthmatic bronchitis [J45.901] 01/07/2015  . Major depressive disorder, recurrent severe without psychotic features (HCC) [F33.2]   . Overdose [T50.901A]   . Gait difficulty [R26.9] 02/11/2014  . Unresponsive episode [R40.4] 02/08/2014  . Acute back pain [M54.9] 09/23/2013  . Protein-calorie malnutrition, severe (HCC) [E43] 09/18/2013  . Acute respiratory failure with hypoxia (HCC) [J96.01] 09/18/2013  . Drug overdose [T50.901A] 09/14/2013  . History of suicide attempt [Z91.89] 09/14/2013  . Recurrent Major depressive disorder,severe [F32.9] 09/14/2013  . Anxiety [F41.9] 09/14/2013  . Compression fracture of L1 lumbar vertebra with delayed healing [S32.010G] 09/14/2013  . Lumbar herniated disc [M51.26] 09/14/2013  . Abnormal EKG [R94.31] 09/14/2013  . Suicide attempt by drug ingestion (HCC) [T50.902A] 09/14/2013  . Chronic low back pain [M54.5, G89.29] 08/22/2013  . HNP (herniated nucleus pulposus), lumbar [M51.26] 07/13/2013  . Bipolar disorder (HCC) [F31.9] 09/29/2012     Continued  Clinical Symptoms:  Alcohol Use Disorder Identification Test Final Score (AUDIT): 0 The "Alcohol Use Disorders Identification Test", Guidelines for Use in Primary Care, Second Edition.  World Science writerHealth Organization [facility](WHO). Score between 0-7:  no or low risk or alcohol related problems. Score between 8-15:  moderate risk of alcohol related problems. Score between 16-19:  high risk of alcohol related problems. Score 20 or above:  warrants further diagnostic evaluation for alcohol dependence and treatment.   CLINICAL FACTORS:   Depression:   Comorbid alcohol abuse/dependence Alcohol/Substance Abuse/Dependencies  Psychiatric Specialty Exam: Physical Exam  ROS  Blood pressure 99/63, pulse 106, temperature 98.4 F (36.9 C), temperature source Oral, resp. rate 16, height 5\' 2"  (1.575 m), weight 51.483 kg (113 lb 8 oz), SpO2 92 %.Body mass index is 20.75 kg/(m^2).   COGNITIVE FEATURES THAT CONTRIBUTE TO RISK:  Closed-mindedness, Polarized thinking and Thought constriction (tunnel vision)    SUICIDE RISK: mild    PLAN OF CARE: see admission H and P  Medical Decision Making:  Review of Psycho-Social Stressors (1), Review or order clinical lab tests (1), Review of Medication Regimen & Side Effects (2) and Review of New Medication or Change in Dosage (2)  I certify that inpatient services furnished can reasonably be expected to improve the patient's condition.   Maximillion Gill A 07/02/2015, 5:02 PM

## 2015-07-02 NOTE — Progress Notes (Signed)
Recreation Therapy Notes  Date: 01.04.2017 Time: 9:30am Location: 300 Hall Group Room   Group Topic: Stress Management  Goal Area(s) Addresses:  Patient will actively participate in stress management techniques presented during session.   Behavioral Response: Did not attend.   Marykay Lexenise L Layten Aiken, LRT/CTRS        Jearl KlinefelterBlanchfield, William Laske L 07/02/2015 12:46 PM

## 2015-07-03 MED ORDER — DULOXETINE HCL 60 MG PO CPEP: 60.0000 mg | ORAL_CAPSULE | Freq: Every day | ORAL | Status: DC

## 2015-07-03 MED ORDER — BUSPIRONE HCL 15 MG PO TABS: 15.0000 mg | ORAL_TABLET | Freq: Two times a day (BID) | ORAL | Status: DC

## 2015-07-03 MED ORDER — LORAZEPAM 0.5 MG PO TABS: 0.5000 mg | ORAL_TABLET | Freq: Two times a day (BID) | ORAL | Status: DC

## 2015-07-03 MED ORDER — QUETIAPINE FUMARATE 200 MG PO TABS: 200.0000 mg | ORAL_TABLET | Freq: Every day | ORAL | Status: DC

## 2015-07-03 NOTE — Tx Team (Signed)
Interdisciplinary Treatment Plan Update (Adult)  Date:  07/03/2015  Time Reviewed:  10:20 AM   Progress in Treatment: Attending groups: No.  Participating in groups:  No. Taking medication as prescribed:  Yes. Tolerating medication:  Yes. Family/Significant othe contact made:  SPE completed with pt's husband.  Patient understands diagnosis:  Yes Discussing patient identified problems/goals with staff:  Yes. Medical problems stabilized or resolved:  Yes. Denies suicidal/homicidal ideation: Yes. Issues/concerns per patient self-inventory:  Other:  Discharge Plan or Barriers: Pt has appts made with [facility] in [place] for med management and therapy. Pt given information about [facility] as well to discuss with her husband. Brandon Melnick phone number provided if pt would like to schedule orientation.   Reason for Continuation of Hospitalization: none  Comments:  Rachael Ramirez is an 45 y.o. female who presents voluntarily to [place] with her husband. Patient refused to participate in assessment and did not answer any questions.  He states that the patients mother lives near by and went to check on her due to her not answering the phone. He states that the patients mother called him at work and states "she's just sitting in a stare." Patients husband states that when he got home from work the patient was on the couch and was unresponsive. He states that he does not know what may have triggered this event. He states that the patients "goes to behavioral health on [facility] to get her Suboxone and klonopin" but he is not sure if she takes her medications as prescribed. He states that her medication changed about one month ago but he is not sure what she was previously on or what it is currently. Patients husband states that the patient has had several back surgeries and was addicted to pain medications until about two years ago when she started using heroin. He states that she has been off of  heroin and going to the suboxone clinic but is being "weaned off" and is now prescribed two per day and gets a weeks worth of suboxone at a time. He states that she has been off of heroin and on suboxone for the past three weeks. He states that the patient has been nauseous and has vomited three times since yesterday and he is not sure why. He states that the patient has a history of seizures "where she just sits still and blinks but nothing like this."    Estimated length of stay:  D/c today  Additional Comments:  Patient and CSW reviewed pt's identified goals and treatment plan. Patient verbalized understanding and agreed to treatment plan. CSW reviewed [facility] "Discharge Process and Patient Involvement" Form. Pt verbalized understanding of information provided and signed form.    Review of initial/current patient goals per problem list:  1. Goal(s): Patient will participate in aftercare plan  Met: Yes  Target date: at discharge  As evidenced by: Patient will participate within aftercare plan AEB aftercare provider and housing plan at discharge being identified.  07/02/15: CSW assessing for appropriate referrals. Hx of medication management/suboxone maintenance for past 3 weeks at [facility].   07/03/15: Pt has appts scheduled with -outpatient. She plans to return home.   2. Goal (s): Patient will exhibit decreased depressive symptoms and suicidal ideations.  Met: Yes   Target date: at discharge  As evidenced by: Patient will utilize self rating of depression at 3 or below and demonstrate decreased signs of depression or be deemed stable for discharge by MD.  07/02/15:  Pt continues to present with depressed mood; demonstrating some confusion and lack of insight.   07/03/15: Pt rates depression as low today and reports that she feels ready for d/c. Pt reports lessoned confusion with improving insight.  3. Goal(s): Patient will demonstrate decreased signs of withdrawal  due to substance abuse  Met:Yes   Target date:at discharge   As evidenced by: Patient will produce a CIWA/COWS score of 0, have stable vitals signs, and no symptoms of withdrawal.  07/02/15: Pt reports no signs of withdrawal; no CIWA/COWS score taken. High BP. Goal progressing.   07/03/15: Pt reports no signs of withdrawal with stable vitals. Goal met.   Attendees: Patient:   07/03/2015 10:20 AM   Family:   07/03/2015 10:20 AM   Physician:  Dr. Carlton Adam, MD 07/03/2015 10:20 AM   Nursing:  Royal Piedra RN 07/03/2015 10:20 AM   Clinical Social Worker: Maxie Better, LCSW 07/03/2015 10:20 AM   Clinical Social Worker: Erasmo Downer Drinkard LCSWA; Peri Maris LCSWA 07/03/2015 10:20 AM   Other:  Gerline Legacy Nurse Case Manager 07/03/2015 10:20 AM   Other:  07/03/2015 10:20 AM   Other:   07/03/2015 10:20 AM   Other:  07/03/2015 10:20 AM   Other:  07/03/2015 10:20 AM   Other:  07/03/2015 10:20 AM    07/03/2015 10:20 AM    07/03/2015 10:20 AM    07/03/2015 10:20 AM    07/03/2015 10:20 AM    Scribe for Treatment Team:   Maxie Better, LCSW 07/03/2015 10:20 AM

## 2015-07-03 NOTE — Progress Notes (Signed)
Patient ID: Rachael Ramirez, female   DOB: 12/25/1970, 45 y.o.   MRN: [id number]  Pt currently presents with a flat affect and cooperative behavior. Per self inventory, pt rates depression, hopelessness and anxiety art a 0. Pt's daily goal is to "go home" and they intend to do so by "go home." Pt reports good sleep, a poor appetite, low energy and poor concentration. Pt reports her plan is to "go home to my husband and see a therapist."   Pt provided with medications per providers orders. Pt's labs and vitals were monitored throughout the day. Pt supported emotionally and encouraged to express concerns and questions. Pt educated on medications.  Pt's safety ensured with 15 minute and environmental checks. Pt currently denies SI/HI and A/V hallucinations. Pt verbally agrees to seek staff if SI/HI or A/VH occurs and to consult with staff before acting on these thoughts. Pt to be discharged today per MD orders. Will continue POC.

## 2015-07-03 NOTE — Progress Notes (Signed)
Patient ID: [name], female   DOB: 02/09/1971, 45 y.o.   MRN: [id number]  Pt discharged home with her husband. Pt was stable and appreciative at that time. All papers and prescriptions were given and valuables returned. Verbal understanding expressed. Denies SI/HI and A/VH. Pt given opportunity to express concerns and ask questions.

## 2015-07-03 NOTE — Discharge Summary (Signed)
Physician Discharge Summary Note  Patient:  Rachael FreezeRachel Coppernoll is an 45 y.o., female MRN:  [id number] DOB:  04/02/1971 Patient phone:  [phone] (home)  Patient address:   [address] [place] [id number],  Total Time spent with patient: 45 minutes  Date of Admission:  07/01/2015 Date of Discharge: 07/03/2015  Reason for Admission:   45 Y/O female who states she has had multiple back surgeries. States the opioids were not curbing the pain. She used more and more. Started snorting heroin.. States that eventually she was placed on Suboxone, up to 24 mg daily. She has alos been using Klonopin 1 mg. Admits that she might have taken more of the Klonopin. States she States she has been diagnosed with Bipolar although she is not sure of that diagnosis. She does suffer from depression and anxiety. She does not remember what happened. She knows that she was admitted to the medical unit after she did not "communicate." she does not think she took more Suboxone but thinks she took more Klonopin. Denies that this was an OD and attempt to hurt herself.  The initial assessment is as follows: Patients husband states that the patient became unresponsive yesterday around 3:00PM. He states that the patients mother lives near by and went to check on her due to her not answering the phone. He states that the patients mother called him at work and states "she's just sitting in a stare." Patients husband states that when he got home from work the patient was on the couch and was unresponsive. He states that he does not know what may have triggered this event. He states that the patients "goes to [facility] [address] to get her Suboxone and klonopin" but he is not sure if she takes her medications as prescribed. He states that her medication changed about one month ago but he is not sure what she was previously on or what it is currently. Patients husband states that the patient has had several back surgeries  and was addicted to pain medications until about two years ago when she started using heroin. He states that she has been off of heroin and going to the suboxone clinic but is being "weaned off" and is now prescribed two per day and gets a weeks worth of suboxone at a time. He states that she has been off of heroin and on suboxone for the past three weeks. He states that the patient has been nauseous and has vomited three times since yesterday and he is not sure why. He states that the patient has a history of seizures "where she just sits still and blinks but nothing like this."   Principal Problem: MDD (major depressive disorder), recurrent episode, severe (HCC) Discharge Diagnoses: Patient Active Problem List   Diagnosis Date Noted  . Opioid type dependence, continuous (HCC) [F11.20] 07/02/2015  . Generalized anxiety disorder [F41.1] 07/02/2015  . MDD (major depressive disorder), recurrent episode, severe (HCC) [F33.2] 07/01/2015  . Unspecified mood (affective) disorder (HCC) [F39] 06/28/2015  . Altered consciousness [R40.4]   . CAP (community acquired pneumonia) [J18.9] 01/07/2015  . Dehydration, mild [E86.0] 01/07/2015  . Tobacco abuse [Z72.0] 01/07/2015  . Acute asthmatic bronchitis [J45.901] 01/07/2015  . Major depressive disorder, recurrent severe without psychotic features (HCC) [F33.2]   . Overdose [T50.901A]   . Gait difficulty [R26.9] 02/11/2014  . Unresponsive episode [R40.4] 02/08/2014  . Acute back pain [M54.9] 09/23/2013  . Protein-calorie malnutrition, severe (HCC) [E43] 09/18/2013  . Acute respiratory  failure with hypoxia (HCC) [J96.01] 09/18/2013  . Drug overdose [T50.901A] 09/14/2013  . History of suicide attempt [Z91.89] 09/14/2013  . Recurrent Major depressive disorder,severe [F32.9] 09/14/2013  . Anxiety [F41.9] 09/14/2013  . Compression fracture of L1 lumbar vertebra with delayed healing [S32.010G] 09/14/2013  . Lumbar herniated disc [M51.26] 09/14/2013  .  Abnormal EKG [R94.31] 09/14/2013  . Suicide attempt by drug ingestion (HCC) [T50.902A] 09/14/2013  . Chronic low back pain [M54.5, G89.29] 08/22/2013  . HNP (herniated nucleus pulposus), lumbar [M51.26] 07/13/2013  . Bipolar disorder (HCC) [F31.9] 09/29/2012    Past Psychiatric History: See H&P  Past Medical History:  Past Medical History  Diagnosis Date  . Back pain   . Asthma     uses Combivent daily as needed  . Insomnia     takes Trazodone nightly  . Anxiety     takes Klonopin daily as needed  . PONV (postoperative nausea and vomiting)   . Cough   . Spinal headache   . Weakness     left leg  . GERD (gastroesophageal reflux disease)     takes Omeprazole daily  . Urinary urgency   . Bipolar 1 disorder (HCC)     takes Lamictal daily  . Chronic low back pain     HNP and Radiculopathy  . MRSA (methicillin resistant Staphylococcus aureus)     Postive nasal swab  . Hypertension   . Collapsed lung 3/15    "while in hospital awaiting back OR"  . CAP (community acquired pneumonia) 01/07/2015    Past Surgical History  Procedure Laterality Date  . Percutaneous pinning Right 1992    "hand"  . Anterior cervical decomp/discectomy fusion  1996    "took bone off her hip"  . Elbow fracture surgery Left 1975  . Elbow fracture surgery Left 2002    "redo"  . Lumbar disc surgery  2008 X 2  . Posterior lumbar fusion  2008  . Hardware removal  2008    "took screw out of lower back"  . Epidural block injection    . Lumbar laminectomy/decompression microdiscectomy Left 07/13/2013    Procedure: Left Lumbar four-five microdiskectomy;  Surgeon: Carmela Hurt, MD;  Location: [facility];  Service: Neurosurgery;  Laterality: Left;  Left Lumbar four-five microdiskectomy  . Lumbar wound debridement N/A 09/21/2013    Procedure: LUMBAR WOUND re-exploration and disectomy;  Surgeon: Carmela Hurt, MD;  Location: [facility];  Service: Neurosurgery;  Laterality: N/A;  . Tonsillectomy    .  Fracture surgery    . Back surgery     Family History:  Family History  Problem Relation Age of Onset  . Multiple sclerosis Mother   . COPD Father   . Alcohol abuse Maternal Grandfather   . Alcohol abuse Brother   . Cirrhosis Brother    Family Psychiatric  History: See H&P Social History:  History  Alcohol Use No     History  Drug Use  . Yes    Comment: heroin    Social History   Social History  . Marital Status: Married    Spouse Name: N/A  . Number of Children: 2  . Years of Education: hs   Occupational History  . Disabled    Social History Main Topics  . Smoking status: Current Every Day Smoker -- 1.00 packs/day for 18 years    Types: Cigarettes  . Smokeless tobacco: Never Used  . Alcohol Use: No  . Drug Use: Yes  Comment: heroin  . Sexual Activity: Yes   Other Topics Concern  . None   Social History Narrative    Hospital Course:   Endiya Klahr was admitted for MDD (major depressive disorder), recurrent episode, severe (HCC) , and crisis management.  Pt was treated discharged with the medications listed below under Medication List.  Medical problems were identified and treated as needed.  Home medications were restarted as appropriate.  Improvement was monitored by observation and Rachael Ramirez 's daily report of symptom reduction.  Emotional and mental status was monitored by daily self-inventory reports completed by Rachael Ramirez and clinical staff.         Darneisha Windhorst was evaluated by the treatment team for stability and plans for continued recovery upon discharge. Leonia Heatherly 's motivation was an integral factor for scheduling further treatment. Employment, transportation, bed availability, health status, family support, and any pending legal issues were also considered during hospital stay. Pt was offered further treatment options upon discharge including but not limited to Residential, Intensive Outpatient, and Outpatient treatment.  Terence Googe will follow  up with the services as listed below under Follow Up Information.     Upon completion of this admission the patient was both mentally and medically stable for discharge denying suicidal/homicidal ideation, auditory/visual/tactile hallucinations, delusional thoughts and paranoia.    Rachael Ramirez responded well to treatment with Buspar, Cymbalta, Ativan, Seroquel without adverse effects. Pt demonstrated improvement without reported or observed adverse effects to the point of stability appropriate for outpatient management. Pertinent labs include: UDS+ benzo,  Sodium 148 (asymptomatci), BUN 32. Reviewed CBC, CMP, BAL, and UDS; all unremarkable aside from noted exceptions.   Physical Findings: AIMS: Facial and Oral Movements Muscles of Facial Expression: None, normal Lips and Perioral Area: None, normal Jaw: None, normal Tongue: None, normal,Extremity Movements Upper (arms, wrists, hands, fingers): None, normal Lower (legs, knees, ankles, toes): None, normal, Trunk Movements Neck, shoulders, hips: None, normal, Overall Severity Severity of abnormal movements (highest score from questions above): None, normal Incapacitation due to abnormal movements: None, normal Patient's awareness of abnormal movements (rate only patient's report): No Awareness, Dental Status Current problems with teeth and/or dentures?: No Does patient usually wear dentures?: Yes  CIWA:    COWS:     Musculoskeletal: Strength & Muscle Tone: within normal limits Gait & Station: normal Patient leans: N/A  Psychiatric Specialty Exam: ROS  Blood pressure 92/75, pulse 113, temperature 98.5 F (36.9 C), temperature source Oral, resp. rate 16, height 5\' 2"  (1.575 m), weight 51.483 kg (113 lb 8 oz), SpO2 92 %.Body mass index is 20.75 kg/(m^2).  SEE MD PSE within the SRA   Have you used any form of tobacco in the last 30 days? (Cigarettes, Smokeless Tobacco, Cigars, and/or Pipes): Yes  Has this patient used any form of tobacco  in the last 30 days? (Cigarettes, Smokeless Tobacco, Cigars, and/or Pipes) Yes, No  Metabolic Disorder Labs:  No results found for: HGBA1C, MPG No results found for: PROLACTIN No results found for: CHOL, TRIG, HDL, CHOLHDL, VLDL, LDLCALC  See Psychiatric Specialty Exam and Suicide Risk Assessment completed by Attending Physician prior to discharge.  Discharge destination:  Home  Is patient on multiple antipsychotic therapies at discharge:  No   Has Patient had three or more failed trials of antipsychotic monotherapy by history:  No  Recommended Plan for Multiple Antipsychotic Therapies: NA     Medication List    STOP taking these medications  citalopram 20 MG tablet  Commonly known as:  CELEXA     clonazePAM 0.5 MG tablet  Commonly known as:  KLONOPIN     NARCAN 4 MG/0.1ML Liqd  Generic drug:  Naloxone HCl     ondansetron 8 MG tablet  Commonly known as:  ZOFRAN      TAKE these medications      Indication   busPIRone 15 MG tablet  Commonly known as:  BUSPAR  Take 1 tablet (15 mg total) by mouth 2 (two) times daily.   Indication:  Anxiety Disorder     COMBIVENT RESPIMAT 20-100 MCG/ACT Aers respimat  Generic drug:  Ipratropium-Albuterol  Inhale 1-2 puffs into the lungs every 6 (six) hours as needed for wheezing or shortness of breath.      DULoxetine 60 MG capsule  Commonly known as:  CYMBALTA  Take 1 capsule (60 mg total) by mouth daily.   Indication:  Generalized Anxiety Disorder, Major Depressive Disorder     LORazepam 0.5 MG tablet  Commonly known as:  ATIVAN  Take 1 tablet (0.5 mg total) by mouth 2 (two) times daily.   Indication:  Anxiousness associated with Depression     QUEtiapine 200 MG tablet  Commonly known as:  SEROQUEL  Take 1 tablet (200 mg total) by mouth at bedtime.   Indication:  mood stabilization           Follow-up Information    Follow up with [facility]  On 08/01/2015.   Why:  Appt on this date with Dr.  Lolly Mustache for medication management at 9:00AM. Please arrive at 8:00AM to complete new patient packet. Please bring insurance card with you to this appt.    Contact information:   [address] [place], [place] [address] Phone: [phone] Fax: [phone]      Follow up with [facility] On 08/18/2015.   Why:  Appt on this date for counseling at 8:45AM with Tomma Lightning.    Contact information:   [address] [place], [place] [address] Phone: [phone] Fax: [phone]      Follow up with [facility].   Why:  If you are interested in Chemical Dependancy Intensive Outpatient Counseling (Mon, Wed, Fri from 1pm-4pm) weekly with Charmian Muff, please call office to schedule orienation with Dewayne Hatch. Thank you. (Pamphlet in discharge paperwork)   Contact information:   [address] [place], [place] [address] Phone: [phone] Fax: [phone]      Follow-up recommendations:  Activity:  As tolerated Diet:  Heart healthy with low sodium.  Comments:   Take all medications as prescribed. Keep all follow-up appointments as scheduled.  Do not consume alcohol or use illegal drugs while on prescription medications. Report any adverse effects from your medications to your primary care provider promptly.  In the event of recurrent symptoms or worsening symptoms, call 911, a crisis hotline, or go to the nearest emergency department for evaluation.   Signed: Beau Fanny, FNP-BC 07/03/2015, 11:51 AM  I personally assessed the patient and formulated the plan Madie Reno A. Dub Mikes, M.D.

## 2015-07-03 NOTE — [facility] Suicide Risk Assessment (Signed)
[facility] INPATIENT:  Family/Significant Other Suicide Prevention Education  Suicide Prevention Education:  Education Completed; Rachael DanceJeff Ramirez (pt's husband) [phone] has been identified by the patient as the family member/significant other with whom the patient will be residing, and identified as the person(s) who will aid the patient in the event of a mental health crisis (suicidal ideations/suicide attempt).  With written consent from the patient, the family member/significant other has been provided the following suicide prevention education, prior to the and/or following the discharge of the patient.  The suicide prevention education provided includes the following:  Suicide risk factors  Suicide prevention and interventions  National Suicide Hotline telephone number  [facility] assessment telephone number  [facility] Emergency Assistance 911  [facility] and/or Residential Mobile [facility] telephone number  Request made of family/significant other to:  Remove weapons (e.g., guns, rifles, knives), all items previously/currently identified as safety concern.    Remove drugs/medications (over-the-counter, prescriptions, illicit drugs), all items previously/currently identified as a safety concern.  The family member/significant other verbalizes understanding of the suicide prevention education information provided.  The family member/significant other agrees to remove the items of safety concern listed above.  Smart, Myron Stankovich LCSW 07/03/2015, 10:17 AM

## 2015-07-03 NOTE — [facility] Suicide Risk Assessment (Signed)
[facility] Discharge Suicide Risk Assessment   Demographic Factors:  Caucasian  Total Time spent with patient: 30 minutes  Musculoskeletal: Strength & Muscle Tone: within normal limits Gait & Station: normal Patient leans: normal  Psychiatric Specialty Exam: Physical Exam  Review of Systems  Constitutional: Negative.   HENT: Negative.   Eyes: Negative.   Respiratory: Negative.   Cardiovascular: Negative.   Gastrointestinal: Negative.   Genitourinary: Negative.   Musculoskeletal: Negative.   Skin: Negative.   Neurological: Negative.   Endo/Heme/Allergies: Negative.   Psychiatric/Behavioral: Positive for substance abuse.    Blood pressure 92/75, pulse 113, temperature 98.5 F (36.9 C), temperature source Oral, resp. rate 16, height 5\' 2"  (1.575 m), weight 51.483 kg (113 lb 8 oz), SpO2 92 %.Body mass index is 20.75 kg/(m^2).  General Appearance: Fairly Groomed  Patent attorney::  Fair  Speech:  Clear and Coherent409  Volume:  Normal  Mood:  Euthymic  Affect:  Appropriate  Thought Process:  Coherent and Goal Directed  Orientation:  Full (Time, Place, and Person)  Thought Content:  plans as she moves on, relapse prevention plan  Suicidal Thoughts:  No  Homicidal Thoughts:  No  Memory:  Immediate;   Fair Recent;   Fair Remote;   Fair  Judgement:  Fair  Insight:  Present  Psychomotor Activity:  Normal  Concentration:  Fair  Recall:  [employer] of Knowledge:Fair  Language: Fair  Akathisia:  No  Handed:  Right  AIMS (if indicated):     Assets:  Desire for Improvement Housing Social Support  Sleep:  Number of Hours: 6.25  Cognition: WNL  ADL's:  Intact   Have you used any form of tobacco in the last 30 days? (Cigarettes, Smokeless Tobacco, Cigars, and/or Pipes): Yes  Has this patient used any form of tobacco in the last 30 days? (Cigarettes, Smokeless Tobacco, Cigars, and/or Pipes) Yes, A prescription for an FDA-approved tobacco cessation medication was offered at discharge  and the patient refused  Mental Status Per Nursing Assessment::   On Admission:  NA  Current Mental Status by Physician: In full contact with reality. There are no active SI plans or intent. There are no active S/S of withdrawal. She is willing and motivated to pursue outpatient treatment and be off the Suboxone and the benzodiazepines   Loss Factors: NA  Historical Factors: NA  Risk Reduction Factors:   Sense of responsibility to family, Living with another person, especially a relative and Positive social support  Continued Clinical Symptoms:  Depression:   Comorbid alcohol abuse/dependence Alcohol/Substance Abuse/Dependencies  Cognitive Features That Contribute To Risk:  Closed-mindedness, Polarized thinking and Thought constriction (tunnel vision)    Suicide Risk:  Minimal: No identifiable suicidal ideation.  Patients presenting with no risk factors but with morbid ruminations; may be classified as minimal risk based on the severity of the depressive symptoms  Principal Problem: MDD (major depressive disorder), recurrent episode, severe [facility]) Discharge Diagnoses:  Patient Active Problem List   Diagnosis Date Noted  . Opioid type dependence, continuous (HCC) [F11.20] 07/02/2015  . Generalized anxiety disorder [F41.1] 07/02/2015  . MDD (major depressive disorder), recurrent episode, severe (HCC) [F33.2] 07/01/2015  . Unspecified mood (affective) disorder (HCC) [F39] 06/28/2015  . Altered consciousness [R40.4]   . CAP (community acquired pneumonia) [J18.9] 01/07/2015  . Dehydration, mild [E86.0] 01/07/2015  . Tobacco abuse [Z72.0] 01/07/2015  . Acute asthmatic bronchitis [J45.901] 01/07/2015  . Major depressive disorder, recurrent severe without psychotic features (HCC) [F33.2]   . Overdose [  T50.901A]   . Gait difficulty [R26.9] 02/11/2014  . Unresponsive episode [R40.4] 02/08/2014  . Acute back pain [M54.9] 09/23/2013  . Protein-calorie malnutrition, severe (HCC) [E43]  09/18/2013  . Acute respiratory failure with hypoxia (HCC) [J96.01] 09/18/2013  . Drug overdose [T50.901A] 09/14/2013  . History of suicide attempt [Z91.89] 09/14/2013  . Recurrent Major depressive disorder,severe [F32.9] 09/14/2013  . Anxiety [F41.9] 09/14/2013  . Compression fracture of L1 lumbar vertebra with delayed healing [S32.010G] 09/14/2013  . Lumbar herniated disc [M51.26] 09/14/2013  . Abnormal EKG [R94.31] 09/14/2013  . Suicide attempt by drug ingestion (HCC) [T50.902A] 09/14/2013  . Chronic low back pain [M54.5, G89.29] 08/22/2013  . HNP (herniated nucleus pulposus), lumbar [M51.26] 07/13/2013  . Bipolar disorder (HCC) [F31.9] 09/29/2012    Follow-up Information    [facility]  On 08/01/2015.   Why:  Appt on this date with Dr. Lolly MustacheArfeen for medication management at 9:00AM. Please arrive at 8:00AM to complete new patient packet. Please bring insurance card with you to this appt.    Contact information:   [address] [place], [place] [address] Phone: [phone] Fax: [phone]      [facility] On 08/18/2015.   Why:  Appt on this date for counseling at 8:45AM with Tomma LightningFrankie.    Contact information:   [address] [place], [place] [address] Phone: [phone] Fax: [phone]      Follow up with [facility].   Why:  If you are interested in Chemical Dependancy Intensive Outpatient Counseling (Mon, Wed, Fri from 1pm-4pm) weekly with Charmian MuffAnn Evans, please call office to schedule orienation with Dewayne HatchAnn. Thank you. (Pamphlet in discharge paperwork)   Contact information:   [address] [place], [place] [address] Phone: [phone] Fax: [phone]      Plan Of Care/Follow-up recommendations:  Activity:  as tolerated Diet:  regular Follow up as above Is patient on multiple antipsychotic therapies at discharge:  No   Has Patient had three or more failed trials of antipsychotic  monotherapy by history:  No  Recommended Plan for Multiple Antipsychotic Therapies: NA    Jewelene Mairena A 07/03/2015, 1:46 PM

## 2015-07-03 NOTE — [facility] Group Notes (Signed)
[facility] Group Notes:  (Nursing/MHT/Case Management/Adjunct)  Date:  07/03/2015  Time:  0915  Type of Therapy:  Nurse Education  Participation Level:  Did Not Attend  Participation Quality:    Affect:    Cognitive:    Insight:    Engagement in Group:    Modes of Intervention:    Summary of Progress/Problems:  Rachael Ramirez, Rachael Ramirez 07/03/2015, 10:09 AM

## 2015-07-03 NOTE — Progress Notes (Signed)
D    Pt isolated to her room   She reports some mild to moderate withdrawal symptoms   She admits to some depression and anxiety   She reports feeling much better and is supposed to be discharged tomorrow A   Verbal support given   Medications administered and effectiveness monitored   Q 15 min checks R   Pt safe at present

## 2015-07-03 NOTE — Progress Notes (Signed)
  [facility] Adult Case Management Discharge Plan :  Will you be returning to the same living situation after discharge:  Yes,  home with husband At discharge, do you have transportation home?: Yes,  husband will transport pt home at 1:30PM Do you have the ability to pay for your medications: Yes,  [employer]  Release of information consent forms completed and submitted to medical records by CSW.  Patient to Follow up at: Follow-up Information    [facility]  On 08/01/2015.   Why:  Appt on this date with Dr. Lolly MustacheArfeen for medication management at 9:00AM. Please arrive at 8:00AM to complete new patient packet. Please bring insurance card with you to this appt.    Contact information:   [address] [place], [place] [address] Phone: [phone] Fax: [phone]      [facility] On 08/18/2015.   Why:  Appt on this date for counseling at 8:45AM with Tomma LightningFrankie.    Contact information:   [address] [place], [place] [address] Phone: [phone] Fax: [phone]      Follow up with [facility].   Why:  If you are interested in Chemical Dependancy Intensive Outpatient Counseling (Mon, Wed, Fri from 1pm-4pm) weekly with Charmian MuffAnn Evans, please call office to schedule orienation with Dewayne HatchAnn. Thank you. (Pamphlet in discharge paperwork)   Contact information:   [address] [place], [place] [address] Phone: [phone] Fax: [phone]      Next level of care provider has access to [facility] Link:no  Safety Planning and Suicide Prevention discussed: Yes,  SPE completed with pt's husband. Pt given SPI pamphlet and encouraged to share information with support network.   Have you used any form of tobacco in the last 30 days? (Cigarettes, Smokeless Tobacco, Cigars, and/or Pipes): Yes  Has patient been referred to the Quitline?: Patient refused referral  Patient has been referred for addiction treatment: Yes-pt is  thinking [facility] and has been given information and contact name/number for Charmian MuffAnn Evans.   Smart, Sabreen Kitchen LCSW 07/03/2015, 10:18 AM

## 2015-08-01 ENCOUNTER — Ambulatory Visit (HOSPITAL_COMMUNITY): Payer: Self-pay | Admitting: Psychiatry

## 2015-08-18 ENCOUNTER — Ambulatory Visit (HOSPITAL_COMMUNITY): Payer: Self-pay | Admitting: Clinical

## 2015-09-08 ENCOUNTER — Emergency Department (HOSPITAL_COMMUNITY)
Admission: EM | Admit: 2015-09-08 | Discharge: 2015-09-09 | Disposition: A | Discharge status: Other Acute Inpatient Hospital | Payer: Managed Care, Other (non HMO) | Attending: Emergency Medicine | Admitting: Emergency Medicine

## 2015-09-08 ENCOUNTER — Encounter (HOSPITAL_COMMUNITY): Payer: Self-pay

## 2015-09-08 DIAGNOSIS — K219 Gastro-esophageal reflux disease without esophagitis: Secondary | ICD-10-CM | POA: Diagnosis not present

## 2015-09-08 DIAGNOSIS — Z8614 Personal history of Methicillin resistant Staphylococcus aureus infection: Secondary | ICD-10-CM | POA: Insufficient documentation

## 2015-09-08 DIAGNOSIS — I10 Essential (primary) hypertension: Secondary | ICD-10-CM | POA: Insufficient documentation

## 2015-09-08 DIAGNOSIS — F319 Bipolar disorder, unspecified: Secondary | ICD-10-CM | POA: Diagnosis not present

## 2015-09-08 DIAGNOSIS — G8929 Other chronic pain: Secondary | ICD-10-CM | POA: Diagnosis not present

## 2015-09-08 DIAGNOSIS — T1491XA Suicide attempt, initial encounter: Secondary | ICD-10-CM

## 2015-09-08 DIAGNOSIS — T1491 Suicide attempt: Secondary | ICD-10-CM | POA: Diagnosis present

## 2015-09-08 DIAGNOSIS — Y9289 Other specified places as the place of occurrence of the external cause: Secondary | ICD-10-CM | POA: Diagnosis not present

## 2015-09-08 DIAGNOSIS — S51811A Laceration without foreign body of right forearm, initial encounter: Secondary | ICD-10-CM | POA: Diagnosis not present

## 2015-09-08 DIAGNOSIS — Z8701 Personal history of pneumonia (recurrent): Secondary | ICD-10-CM | POA: Diagnosis not present

## 2015-09-08 DIAGNOSIS — X781XXA Intentional self-harm by knife, initial encounter: Secondary | ICD-10-CM | POA: Diagnosis not present

## 2015-09-08 DIAGNOSIS — S51812A Laceration without foreign body of left forearm, initial encounter: Secondary | ICD-10-CM | POA: Insufficient documentation

## 2015-09-08 DIAGNOSIS — F17210 Nicotine dependence, cigarettes, uncomplicated: Secondary | ICD-10-CM | POA: Diagnosis not present

## 2015-09-08 DIAGNOSIS — Y9389 Activity, other specified: Secondary | ICD-10-CM | POA: Insufficient documentation

## 2015-09-08 DIAGNOSIS — F419 Anxiety disorder, unspecified: Secondary | ICD-10-CM | POA: Diagnosis not present

## 2015-09-08 DIAGNOSIS — Y998 Other external cause status: Secondary | ICD-10-CM | POA: Diagnosis not present

## 2015-09-08 DIAGNOSIS — F1110 Opioid abuse, uncomplicated: Secondary | ICD-10-CM | POA: Insufficient documentation

## 2015-09-08 DIAGNOSIS — G4700 Insomnia, unspecified: Secondary | ICD-10-CM | POA: Diagnosis not present

## 2015-09-08 DIAGNOSIS — Z3202 Encounter for pregnancy test, result negative: Secondary | ICD-10-CM | POA: Diagnosis not present

## 2015-09-08 DIAGNOSIS — J45909 Unspecified asthma, uncomplicated: Secondary | ICD-10-CM | POA: Diagnosis not present

## 2015-09-08 LAB — CBC WITH DIFFERENTIAL/PLATELET
BASOS PCT: 0 %
Basophils Absolute: 0 10*3/uL (ref 0.0–0.1)
Eosinophils Absolute: 0.2 10*3/uL (ref 0.0–0.7)
Eosinophils Relative: 3 %
HEMATOCRIT: 40.7 % (ref 36.0–46.0)
HEMOGLOBIN: 13.2 g/dL (ref 12.0–15.0)
LYMPHS PCT: 27 %
Lymphs Abs: 1.8 10*3/uL (ref 0.7–4.0)
MCH: 31.5 pg (ref 26.0–34.0)
MCHC: 32.4 g/dL (ref 30.0–36.0)
MCV: 97.1 fL (ref 78.0–100.0)
MONO ABS: 0.3 10*3/uL (ref 0.1–1.0)
MONOS PCT: 5 %
NEUTROS ABS: 4.3 10*3/uL (ref 1.7–7.7)
NEUTROS PCT: 65 %
Platelets: 254 10*3/uL (ref 150–400)
RBC: 4.19 MIL/uL (ref 3.87–5.11)
RDW: 16.2 % — AB (ref 11.5–15.5)
WBC: 6.6 10*3/uL (ref 4.0–10.5)

## 2015-09-08 LAB — URINALYSIS, ROUTINE W REFLEX MICROSCOPIC
BILIRUBIN URINE: NEGATIVE
Glucose, UA: NEGATIVE mg/dL
Hgb urine dipstick: NEGATIVE
Ketones, ur: NEGATIVE mg/dL
Leukocytes, UA: NEGATIVE
NITRITE: NEGATIVE
PH: 6.5 (ref 5.0–8.0)
Protein, ur: NEGATIVE mg/dL
SPECIFIC GRAVITY, URINE: 1.002 — AB (ref 1.005–1.030)

## 2015-09-08 LAB — RAPID URINE DRUG SCREEN, HOSP PERFORMED
AMPHETAMINES: NOT DETECTED
BARBITURATES: NOT DETECTED
Benzodiazepines: NOT DETECTED
COCAINE: NOT DETECTED
OPIATES: POSITIVE — AB
TETRAHYDROCANNABINOL: NOT DETECTED

## 2015-09-08 LAB — COMPREHENSIVE METABOLIC PANEL
ALBUMIN: 3.1 g/dL — AB (ref 3.5–5.0)
ALT: 11 U/L — ABNORMAL LOW (ref 14–54)
AST: 19 U/L (ref 15–41)
Alkaline Phosphatase: 97 U/L (ref 38–126)
Anion gap: 8 (ref 5–15)
CHLORIDE: 109 mmol/L (ref 101–111)
CO2: 26 mmol/L (ref 22–32)
Calcium: 8.7 mg/dL — ABNORMAL LOW (ref 8.9–10.3)
Creatinine, Ser: 0.54 mg/dL (ref 0.44–1.00)
GFR calc Af Amer: >60 mL/min (ref >60)
GFR calc non Af Amer: >60 mL/min (ref >60)
GLUCOSE: 88 mg/dL (ref 65–99)
POTASSIUM: 3.6 mmol/L (ref 3.5–5.1)
SODIUM: 143 mmol/L (ref 135–145)
Total Bilirubin: 0.6 mg/dL (ref 0.3–1.2)
Total Protein: 6.1 g/dL — ABNORMAL LOW (ref 6.5–8.1)

## 2015-09-08 LAB — PREGNANCY, URINE: PREG TEST UR: NEGATIVE

## 2015-09-08 LAB — ETHANOL: Alcohol, Ethyl (B): <5 mg/dL (ref <5)

## 2015-09-08 LAB — SALICYLATE LEVEL

## 2015-09-08 LAB — ACETAMINOPHEN LEVEL: Acetaminophen (Tylenol), Serum: <10 ug/mL — ABNORMAL LOW (ref 10–30)

## 2015-09-08 MED ORDER — ALUM & MAG HYDROXIDE-SIMETH 200-200-20 MG/5ML PO SUSP: 30.0000 mL | ORAL | Status: DC | PRN

## 2015-09-08 MED ORDER — ACETAMINOPHEN 325 MG PO TABS: 650.0000 mg | ORAL_TABLET | ORAL | Status: DC | PRN

## 2015-09-08 MED ORDER — IPRATROPIUM-ALBUTEROL 20-100 MCG/ACT IN AERS: 1.0000 | INHALATION_SPRAY | Freq: Four times a day (QID) | RESPIRATORY_TRACT | Status: DC | PRN

## 2015-09-08 MED ORDER — LORAZEPAM 1 MG PO TABS
1.0000 mg | ORAL_TABLET | Freq: Three times a day (TID) | ORAL | Status: DC | PRN
  Administered 2015-09-09: 1 mg via ORAL
  Filled 2015-09-08: qty 1

## 2015-09-08 MED ORDER — LIDOCAINE HCL 2 % IJ SOLN
20.0000 mL | Freq: Once | INTRAMUSCULAR | Status: AC
  Administered 2015-09-08: 400 mg via INTRADERMAL
  Filled 2015-09-08: qty 20

## 2015-09-08 MED ORDER — IPRATROPIUM-ALBUTEROL 0.5-2.5 (3) MG/3ML IN SOLN
3.0000 mL | Freq: Four times a day (QID) | RESPIRATORY_TRACT | Status: DC | PRN
  Administered 2015-09-08: 3 mL via RESPIRATORY_TRACT
  Filled 2015-09-08: qty 3

## 2015-09-08 MED ORDER — PREGABALIN 50 MG PO CAPS
300.0000 mg | ORAL_CAPSULE | Freq: Two times a day (BID) | ORAL | Status: DC
  Administered 2015-09-08 – 2015-09-09 (×2): 300 mg via ORAL
  Filled 2015-09-08 (×2): qty 6

## 2015-09-08 MED ORDER — QUETIAPINE FUMARATE 100 MG PO TABS
200.0000 mg | ORAL_TABLET | Freq: Every day | ORAL | Status: DC
  Administered 2015-09-08: 200 mg via ORAL
  Filled 2015-09-08: qty 2

## 2015-09-08 MED ORDER — IBUPROFEN 200 MG PO TABS: 600.0000 mg | ORAL_TABLET | Freq: Three times a day (TID) | ORAL | Status: DC | PRN

## 2015-09-08 MED ORDER — ONDANSETRON HCL 4 MG PO TABS: 4.0000 mg | ORAL_TABLET | Freq: Three times a day (TID) | ORAL | Status: DC | PRN

## 2015-09-08 NOTE — ED Notes (Signed)
Pt presents with SI, vertical lacerations to right and left forearm, self inflicted.  Dressings to bilateral arms intact and dry.  Feeling hopeless, denies HI or AVH.  Admits to previous SI attempt 2 yrs ago.  Pt reports last used Heroin at 8:30am this morning. AAO x 3, no acute distress noted, calm & cooperative, monitoring for safety, Q 15 min checks in effect.

## 2015-09-08 NOTE — ED Provider Notes (Signed)
LACERATION REPAIR Performed by: Lottie MusselKIRICHENKO, Aison Malveaux A Authorized by: Jaynie CrumbleKIRICHENKO, Danyl Deems A Consent: Verbal consent obtained. Risks and benefits: risks, benefits and alternatives were discussed Consent given by: patient Patient identity confirmed: provided demographic data Prepped and Draped in normal sterile fashion Wound explored  Laceration Location: right forearm  Laceration Length: 10cm  No Foreign Bodies seen or palpated  Anesthesia: local infiltration  Local anesthetic: lidocaine 2% wo epinephrine  Anesthetic total: 3 ml  Irrigation method: syringe Amount of cleaning: standard  Skin closure: prolene 5.0  Number of sutures: 10  Technique: simple interrupted  Patient tolerance: Patient tolerated the procedure well with no immediate complications.  LACERATION REPAIR Performed by: Lottie MusselKIRICHENKO, Damari Suastegui A Authorized by: Jaynie CrumbleKIRICHENKO, Inella Kuwahara A Consent: Verbal consent obtained. Risks and benefits: risks, benefits and alternatives were discussed Consent given by: patient Patient identity confirmed: provided demographic data Prepped and Draped in normal sterile fashion Wound explored  Laceration Location: left forearm  Laceration Length: 10cm  No Foreign Bodies seen or palpated  Anesthesia: local infiltration  Local anesthetic: lidocaine 2% wo epinephrine  Anesthetic total: 3 ml  Irrigation method: syringe Amount of cleaning: standard  Skin closure: prolene 5.0  Number of sutures: 8  Technique: simple interrupted  Patient tolerance: Patient tolerated the procedure well with no immediate complications.   Jaynie Crumbleatyana Cathline Dowen, PA-C 09/09/15 0117  Benjiman CoreNathan Pickering, MD 09/11/15 0700

## 2015-09-08 NOTE — ED Notes (Signed)
Patient belongings are blue pants, black shirt, grey tank, purse, black rubber bracelet, and cell phone. All items are in 1 patient belonging bag and placed behind nursing station across from patient room. Patient and items have been wanted by security. Patient is aware of items in bag and that staff has the items at the nursing station. Patient is also requesting that no visitors allowed and no information to be given out to anyone not even husband. Patient has been marked as XXX.

## 2015-09-08 NOTE — ED Provider Notes (Signed)
CSN: [id number]     Arrival date & time 09/08/15  1429 History   First MD Initiated Contact with Patient 09/08/15 1453     Chief Complaint  Patient presents with  . Suicide Attempt     HPI Patient presents to  ER after a suicide attempt. She  Cut both her forearms with a knife.No other injury. States she is a heroin addict. Denies other drug use. States that she did take a small amount of heroin this morning. Reportedly did have an episode of syncope while in the ambulance.States she does not see anyone for her psychiatric problems. States she is depressed partially because her family has lost their belief in her.  Past Medical History  Diagnosis Date  . Back pain   . Asthma     uses Combivent daily as needed  . Insomnia     takes Trazodone nightly  . Anxiety     takes Klonopin daily as needed  . PONV (postoperative nausea and vomiting)   . Cough   . Spinal headache   . Weakness     left leg  . GERD (gastroesophageal reflux disease)     takes Omeprazole daily  . Urinary urgency   . Bipolar 1 disorder (HCC)     takes Lamictal daily  . Chronic low back pain     HNP and Radiculopathy  . MRSA (methicillin resistant Staphylococcus aureus)     Postive nasal swab  . Hypertension   . Collapsed lung 3/15    "while in hospital awaiting back OR"  . CAP (community acquired pneumonia) 01/07/2015   Past Surgical History  Procedure Laterality Date  . Percutaneous pinning Right 1992    "hand"  . Anterior cervical decomp/discectomy fusion  1996    "took bone off her hip"  . Elbow fracture surgery Left 1975  . Elbow fracture surgery Left 2002    "redo"  . Lumbar disc surgery  2008 X 2  . Posterior lumbar fusion  2008  . Hardware removal  2008    "took screw out of lower back"  . Epidural block injection    . Lumbar laminectomy/decompression microdiscectomy Left 07/13/2013    Procedure: Left Lumbar four-five microdiskectomy;  Surgeon: Carmela Hurt, MD;  Location: [facility];   Service: Neurosurgery;  Laterality: Left;  Left Lumbar four-five microdiskectomy  . Lumbar wound debridement N/A 09/21/2013    Procedure: LUMBAR WOUND re-exploration and disectomy;  Surgeon: Carmela Hurt, MD;  Location: [facility];  Service: Neurosurgery;  Laterality: N/A;  . Tonsillectomy    . Fracture surgery    . Back surgery     Family History  Problem Relation Age of Onset  . Multiple sclerosis Mother   . COPD Father   . Alcohol abuse Maternal Grandfather   . Alcohol abuse Brother   . Cirrhosis Brother    Social History  Substance Use Topics  . Smoking status: Current Every Day Smoker -- 1.00 packs/day for 18 years    Types: Cigarettes  . Smokeless tobacco: Never Used  . Alcohol Use: No   OB History    No data available     Review of Systems  Constitutional: Negative for activity change and appetite change.  Eyes: Negative for pain.  Respiratory: Negative for chest tightness and shortness of breath.   Cardiovascular: Negative for chest pain and leg swelling.  Gastrointestinal: Negative for nausea, vomiting, abdominal pain and diarrhea.  Genitourinary: Negative for flank pain.  Musculoskeletal: Negative  for back pain and neck stiffness.  Skin: Negative for rash.  Neurological: Negative for weakness, numbness and headaches.  Psychiatric/Behavioral: Positive for suicidal ideas. Negative for behavioral problems.      Allergies  Minocycline and Tramadol  Home Medications   Prior to Admission medications   Medication Sig Start Date End Date Taking? Authorizing Provider  COMBIVENT RESPIMAT 20-100 MCG/ACT AERS respimat Inhale 1-2 puffs into the lungs every 6 (six) hours as needed for wheezing or shortness of breath.  10/15/14  Yes Historical Provider, MD  pregabalin (LYRICA) 300 MG capsule Take 300 mg by mouth 2 (two) times daily.   Yes Historical Provider, MD  QUEtiapine (SEROQUEL) 200 MG tablet Take 1 tablet (200 mg total) by mouth at bedtime. 07/03/15  Yes Beau Fanny, FNP  traZODone (DESYREL) 100 MG tablet Take 100 mg by mouth at bedtime.   Yes Historical Provider, MD  busPIRone (BUSPAR) 15 MG tablet Take 1 tablet (15 mg total) by mouth 2 (two) times daily. Patient not taking: Reported on 09/08/2015 07/03/15   Beau Fanny, FNP  DULoxetine (CYMBALTA) 60 MG capsule Take 1 capsule (60 mg total) by mouth daily. Patient not taking: Reported on 09/08/2015 07/03/15   Beau Fanny, FNP  LORazepam (ATIVAN) 0.5 MG tablet Take 1 tablet (0.5 mg total) by mouth 2 (two) times daily. Patient not taking: Reported on 09/08/2015 07/03/15   Everardo All Withrow, FNP   BP 140/86 mmHg  Pulse 88  Temp(Src) 98.4 F (36.9 C) (Oral)  Resp 16  SpO2 95% Physical Exam  Constitutional: She appears well-developed.  HENT:  Head: Atraumatic.  Pulmonary/Chest: Effort normal.  Abdominal: Soft. There is no tenderness.  Musculoskeletal: She exhibits no tenderness.  Skin: Skin is warm.  Long linear lacerations on bilateral forearms anteriorly. Approximately 10 cm on each side.good flexion-extensionand vascular activity in bilateral hands.  No visualized tendons. multiple tattoos on bilateral forearms also.  Psychiatric:  Patient appears depressed.    ED Course  Procedures (including critical care time) Labs Review Labs Reviewed  CBC WITH DIFFERENTIAL/PLATELET - Abnormal; Notable for the following:    RDW 16.2 (*)    All other components within normal limits  ACETAMINOPHEN LEVEL - Abnormal; Notable for the following:    Acetaminophen (Tylenol), Serum <10 (*)    All other components within normal limits  URINALYSIS, ROUTINE W REFLEX MICROSCOPIC (NOT AT [facility]) - Abnormal; Notable for the following:    Specific Gravity, Urine 1.002 (*)    All other components within normal limits  URINE RAPID DRUG SCREEN, HOSP PERFORMED - Abnormal; Notable for the following:    Opiates POSITIVE (*)    All other components within normal limits  COMPREHENSIVE METABOLIC PANEL - Abnormal; Notable  for the following:    BUN <5 (*)    Calcium 8.7 (*)    Total Protein 6.1 (*)    Albumin 3.1 (*)    ALT 11 (*)    All other components within normal limits  ETHANOL  PREGNANCY, URINE  SALICYLATE LEVEL    Imaging Review No results found. I have personally reviewed and evaluated these images and lab results as part of my medical decision-making.   EKG Interpretation None      MDM   Final diagnoses:  Forearm laceration, left, initial encounter  Forearm laceration, right, initial encounter  Suicide attempt [facility])  Heroin abuse    Patient with suicide attempt by cutting her forearms.Wounds closed.While in the ER showed episodes ofstaringand some decreased  respirations. She would still be responsive to stimulation with this. No seizure activity seen.This point she appears to medically cleared. To be seen by TTS.At this point the patient is voluntary and wants treatment, however I would not allow her to leavehe would IVC her if needed.    Benjiman CoreNathan Ambika Zettlemoyer, MD 09/08/15 [phone]

## 2015-09-08 NOTE — ED Notes (Signed)
Pt presents via EMS with c/o suicide attempt. Pt reported to EMS that she cut herself because she wanted to kill herself, multiple attempts at same in the past. Pt reports she used heroin this morning around 8:30. For EMS, pt did have a moment of unconsciousness in the ambulance but responded to the IV start and an ammonia inhalant. EMS reports her pupils were pinpoint during this moment of unconsciousness. Pt is awake at this time, able to answer questions for EMS.

## 2015-09-09 ENCOUNTER — Inpatient Hospital Stay (HOSPITAL_COMMUNITY)
Admission: AD | Admit: 2015-09-09 | Discharge: 2015-09-11 | DRG: 885 | Disposition: A | Discharge status: Home / Self Care | Payer: Managed Care, Other (non HMO) | Source: Intra-hospital | Attending: Psychiatry | Admitting: Psychiatry

## 2015-09-09 ENCOUNTER — Encounter (HOSPITAL_COMMUNITY): Payer: Self-pay

## 2015-09-09 DIAGNOSIS — Z981 Arthrodesis status: Secondary | ICD-10-CM | POA: Diagnosis not present

## 2015-09-09 DIAGNOSIS — Z818 Family history of other mental and behavioral disorders: Secondary | ICD-10-CM | POA: Diagnosis not present

## 2015-09-09 DIAGNOSIS — F332 Major depressive disorder, recurrent severe without psychotic features: Principal | ICD-10-CM | POA: Diagnosis present

## 2015-09-09 DIAGNOSIS — J45909 Unspecified asthma, uncomplicated: Secondary | ICD-10-CM | POA: Diagnosis present

## 2015-09-09 DIAGNOSIS — F1120 Opioid dependence, uncomplicated: Secondary | ICD-10-CM | POA: Diagnosis present

## 2015-09-09 DIAGNOSIS — F17210 Nicotine dependence, cigarettes, uncomplicated: Secondary | ICD-10-CM | POA: Diagnosis present

## 2015-09-09 DIAGNOSIS — K219 Gastro-esophageal reflux disease without esophagitis: Secondary | ICD-10-CM | POA: Diagnosis present

## 2015-09-09 DIAGNOSIS — F32A Depression, unspecified: Secondary | ICD-10-CM | POA: Diagnosis present

## 2015-09-09 DIAGNOSIS — F329 Major depressive disorder, single episode, unspecified: Secondary | ICD-10-CM | POA: Diagnosis present

## 2015-09-09 DIAGNOSIS — F410 Panic disorder [episodic paroxysmal anxiety] without agoraphobia: Secondary | ICD-10-CM | POA: Diagnosis present

## 2015-09-09 DIAGNOSIS — Z820 Family history of epilepsy and other diseases of the nervous system: Secondary | ICD-10-CM | POA: Diagnosis not present

## 2015-09-09 DIAGNOSIS — G4700 Insomnia, unspecified: Secondary | ICD-10-CM | POA: Diagnosis present

## 2015-09-09 DIAGNOSIS — F411 Generalized anxiety disorder: Secondary | ICD-10-CM | POA: Diagnosis present

## 2015-09-09 DIAGNOSIS — F1110 Opioid abuse, uncomplicated: Secondary | ICD-10-CM | POA: Diagnosis present

## 2015-09-09 DIAGNOSIS — Z825 Family history of asthma and other chronic lower respiratory diseases: Secondary | ICD-10-CM | POA: Diagnosis not present

## 2015-09-09 DIAGNOSIS — I10 Essential (primary) hypertension: Secondary | ICD-10-CM | POA: Diagnosis present

## 2015-09-09 DIAGNOSIS — S51812A Laceration without foreign body of left forearm, initial encounter: Secondary | ICD-10-CM | POA: Diagnosis not present

## 2015-09-09 MED ORDER — ALBUTEROL SULFATE HFA 108 (90 BASE) MCG/ACT IN AERS: 2.0000 | INHALATION_SPRAY | RESPIRATORY_TRACT | Status: DC | PRN

## 2015-09-09 MED ORDER — ALUM & MAG HYDROXIDE-SIMETH 200-200-20 MG/5ML PO SUSP: 30.0000 mL | ORAL | Status: DC | PRN

## 2015-09-09 MED ORDER — IBUPROFEN 600 MG PO TABS: 600.0000 mg | ORAL_TABLET | Freq: Three times a day (TID) | ORAL | Status: DC | PRN

## 2015-09-09 MED ORDER — ONDANSETRON HCL 4 MG PO TABS: 4.0000 mg | ORAL_TABLET | Freq: Three times a day (TID) | ORAL | Status: DC | PRN

## 2015-09-09 MED ORDER — IPRATROPIUM-ALBUTEROL 0.5-2.5 (3) MG/3ML IN SOLN: 3.0000 mL | Freq: Four times a day (QID) | RESPIRATORY_TRACT | Status: DC | PRN

## 2015-09-09 MED ORDER — METHOCARBAMOL 500 MG PO TABS
500.0000 mg | ORAL_TABLET | Freq: Three times a day (TID) | ORAL | Status: DC | PRN
  Administered 2015-09-09 – 2015-09-10 (×2): 500 mg via ORAL
  Filled 2015-09-09 (×2): qty 1

## 2015-09-09 MED ORDER — MAGNESIUM HYDROXIDE 400 MG/5ML PO SUSP: 30.0000 mL | Freq: Every day | ORAL | Status: DC | PRN

## 2015-09-09 MED ORDER — QUETIAPINE FUMARATE 200 MG PO TABS
200.0000 mg | ORAL_TABLET | Freq: Every day | ORAL | Status: DC
  Administered 2015-09-09 – 2015-09-10 (×2): 200 mg via ORAL
  Filled 2015-09-09 (×4): qty 1

## 2015-09-09 MED ORDER — NICOTINE 21 MG/24HR TD PT24
21.0000 mg | MEDICATED_PATCH | Freq: Every day | TRANSDERMAL | Status: DC
  Administered 2015-09-10 – 2015-09-11 (×2): 21 mg via TRANSDERMAL
  Filled 2015-09-09 (×6): qty 1

## 2015-09-09 MED ORDER — DICYCLOMINE HCL 20 MG PO TABS: 20.0000 mg | ORAL_TABLET | Freq: Four times a day (QID) | ORAL | Status: DC | PRN

## 2015-09-09 MED ORDER — ONDANSETRON 4 MG PO TBDP
4.0000 mg | ORAL_TABLET | Freq: Four times a day (QID) | ORAL | Status: DC | PRN
  Administered 2015-09-10: 4 mg via ORAL
  Filled 2015-09-09: qty 1

## 2015-09-09 MED ORDER — LOPERAMIDE HCL 2 MG PO CAPS
2.0000 mg | ORAL_CAPSULE | ORAL | Status: DC | PRN
  Administered 2015-09-11: 4 mg via ORAL
  Filled 2015-09-09: qty 2

## 2015-09-09 MED ORDER — ENSURE ENLIVE PO LIQD
237.0000 mL | Freq: Two times a day (BID) | ORAL | Status: DC
  Administered 2015-09-10 (×2): 237 mL via ORAL

## 2015-09-09 MED ORDER — ACETAMINOPHEN 325 MG PO TABS: 650.0000 mg | ORAL_TABLET | Freq: Four times a day (QID) | ORAL | Status: DC | PRN

## 2015-09-09 MED ORDER — HYDROXYZINE HCL 25 MG PO TABS
25.0000 mg | ORAL_TABLET | Freq: Four times a day (QID) | ORAL | Status: DC | PRN
  Administered 2015-09-10 (×2): 25 mg via ORAL

## 2015-09-09 MED ORDER — PREGABALIN 100 MG PO CAPS
300.0000 mg | ORAL_CAPSULE | Freq: Two times a day (BID) | ORAL | Status: DC
  Administered 2015-09-09 – 2015-09-11 (×4): 300 mg via ORAL
  Filled 2015-09-09 (×4): qty 3

## 2015-09-09 MED ORDER — LORAZEPAM 1 MG PO TABS
1.0000 mg | ORAL_TABLET | Freq: Three times a day (TID) | ORAL | Status: DC | PRN
  Administered 2015-09-09 – 2015-09-10 (×2): 1 mg via ORAL
  Filled 2015-09-09 (×2): qty 1

## 2015-09-09 MED ORDER — ALBUTEROL SULFATE HFA 108 (90 BASE) MCG/ACT IN AERS
2.0000 | INHALATION_SPRAY | RESPIRATORY_TRACT | Status: DC | PRN
  Administered 2015-09-09 (×2): 2 via RESPIRATORY_TRACT
  Filled 2015-09-09: qty 6.7

## 2015-09-09 NOTE — ED Notes (Signed)
Pt refusing prn medication for pain. Reports to this nurse "none of those medications work" Will continue to monitor.

## 2015-09-09 NOTE — Tx Team (Signed)
Initial Interdisciplinary Treatment Plan   PATIENT STRESSORS: Health problems Loss of grandmother, grandfather, father Marital or family conflict Substance abuse   PATIENT STRENGTHS: Ability for insight Average or above average intelligence Communication skills General fund of knowledge Motivation for treatment/growth   PROBLEM LIST: Problem List/Patient Goals Date to be addressed Date deferred Reason deferred Estimated date of resolution  "I can't be perfect like my husband and mother want me to be" 09/09/2015      "I have nothing to lose, If someone wants to kill themselves they find a way, right?" 09/09/2015     "want you guys to take care of my pain" 09/09/2015     "want to see my kids" 09/09/2015     "I've had 7 back surgeries" 09/09/2015     Using "a gram of heroin a day for two years" 09/09/2015                        DISCHARGE CRITERIA:  Adequate post-discharge living arrangements Improved stabilization in mood, thinking, and/or behavior Motivation to continue treatment in a less acute level of care Reduction of life-threatening or endangering symptoms to within safe limits Withdrawal symptoms are absent or subacute and managed without 24-hour nursing intervention  PRELIMINARY DISCHARGE PLAN: Attend aftercare/continuing care group Participate in family therapy  PATIENT/FAMIILY INVOLVEMENT: This treatment plan has been presented to and reviewed with the patient, Cyndy FreezeRachel Pacitti .The patient and family have been given the opportunity to ask questions and make suggestions.  Aurora Maskwyman, Tiquan Bouch E 09/09/2015, 4:23 PM

## 2015-09-09 NOTE — BHH Counselor (Signed)
Adult Comprehensive Assessment  Patient ID: Rachael Ramirez, female   DOB: March 28, 1971, 45 y.o.   MRN: [id number]  Information Source: Information source: Patient  Current Stressors:  Educational / Learning stressors: High school graduate Employment / Job issues: on disability for past few years due to multiple back surgeries  Family Relationships: good relationship with husband, 2 adult children and grandchild Surveyor, quantity / Lack of resources (include bankruptcy): disability income; husband's income Housing / Lack of housing: lives in home with husband for past 26 years/ [place], [place] Physical health (include injuries & life threatening diseases): degenerative disc disease-7 back surgeries.  Living/Environment/Situation:  Living Arrangements: Spouse/significant other  Family History:  Are you sexually active?: Yes What is your sexual orientation?: heterosexual Has your sexual activity been affected by drugs, alcohol, medication, or emotional stress?: no  Does patient have children?: Yes How many children?: 2 How is patient's relationship with their children?: 24 year old daughter and 60 year old son. good relationship with both. close to her grandchild as well.   Childhood History:  By whom was/is the patient raised?: Both parents Additional childhood history information: parents are married; pt's father "functioning alcoholic" but "it didn't affect our famiily." hx of substance abuse/alcoholism on mother's side of family. Description of patient's relationship with caregiver when they were a child: close to both parents Patient's description of current relationship with people who raised him/her: close to both parents  How were you disciplined when you got in trouble as a child/adolescent?: n/a  Does patient have siblings?: Yes Number of Siblings: 2 Description of patient's current relationship with siblings: both brother and sister struggle with substance abuse issues. brother  has mental illness/depression.  Did patient suffer any verbal/emotional/physical/sexual abuse as a child?: No Did patient suffer from severe childhood neglect?: No Has patient ever been sexually abused/assaulted/raped as an adolescent or adult?: No Was the patient ever a victim of a crime or a disaster?: No Witnessed domestic violence?: No Has patient been effected by domestic violence as an adult?: No  Education:  Highest grade of school patient has completed: high school graduate  Currently a student?: No Learning disability?: No  Employment/Work Situation:  Employment situation: On disability Why is patient on disability: degenerative back disease-7 prior back surgeries.  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 Ongoing use for past few years.  If attempted suicide, did drugs/alcohol play a role in this?: Yes (OD on medication a few years ago "I was tired of being addicted to opiates." ) pt was using heroin when she cut arms prior to this admission.  Alcohol/Substance Abuse Treatment Hx: Past Tx, Inpatient, Past detox, Past Tx, Outpatient If yes, describe treatment: [facility] "about a  year ago  for Si"; [facility] in the past. most recenlty suboxone maintainence and med [facility].  [facility] this year.  Has alcohol/substance abuse ever caused legal problems?: No.   Social Support System:  Patient's Community Support System: Fair Museum/gallery exhibitions officerDescribe Community Support System: some close friends; very supportive family/husband/kids Type of faith/religion: christian How does patient's faith help to cope with current illness?: prayer  Leisure/Recreation:  Leisure and Hobbies: spending time with her grandchild  Strengths/Needs:  What things does the patient do well?: motivated to get off opiates and suboxone.  In what areas does patient struggle / problems for patient: pain management; confusion/withdrawals   Discharge Plan:  Does patient have access to transportation?: Yes (pt has revoked license at this time. husband transports her to appts) Will patient be returning to same living situation after discharge?: Yes (home with husband) [facility] (Dr. Lolly MustacheArfeen and Tomma LightningFrankie for counseling) If no, would patient like referral for services when discharged?:Pt unsure at this time. CSW assessing. Does patient have financial barriers related to discharge medications?: No Counselling psychologist(Cign[employer] insurance and income)       Summary/Recommendations:   Summary and Recommendations (to be completed by the evaluator): Patient is 45 year old [place] and presents to the hospital seeking treatment for suicide attempt by cutting her arms, heroin abuse (1 gram daily), increased depression/anxiety, and for medication stabilization. [facility] similar issues (07/01/15-07/03/15). During last admission she was scheduled to see Dr. Lolly MustacheArfeen for medication management and Tomma LightningFrankie for [facility] [place]. Recommendations for patient include: crisis stabilization, therapeutic milieu, encourage group attendance and participation, medication management for withdrawals/mood stabilization, and development of comprehensive mental wellness/sobriety plan.   Trula SladeSmart, Cassandra Harbold LCSW  09/09/2015 4:17 PM

## 2015-09-09 NOTE — Progress Notes (Signed)
Recreation Therapy Notes  Animal-Assisted Activity (AAA) Program Checklist/Progress Notes Patient Eligibility Criteria Checklist & Daily Group note for Rec Tx Intervention  Date: 03.14.2017 Time: 2:45am Location: [address] Dayroom    AAA/T Program Assumption of Risk Form signed by Patient/ or Parent Legal Guardian yes  Patient is free of allergies or sever asthma yes  Patient reports no fear of animals yes  Patient reports no history of cruelty to animals yes  Patient understands his/her participation is voluntary yes  Behavioral Response: Did not attend.   Tobey Lippard L Bryce Cheever, LRT/CTRS        Rachael Ramirez L 09/09/2015 3:08 PM 

## 2015-09-09 NOTE — ED Notes (Signed)
[facility]. [facility] at facility to transport pt. Pt signed for personal belongings and personal belongings given to [facility]. Ambulatory out of facility.

## 2015-09-09 NOTE — BH Assessment (Signed)
B[facility] Assessment Progress Note  Per Dahlia ByesJosephine Onuoha, NP, this pt requires psychiatric hospitalization at this time.  Berneice Heinrichina Tate, RN, [facility] R[facility].  Pt has signed Voluntary Admission and Consent for Treatment, as well as Consent to Release Information to no one, and signed forms have been [facility]ed to [facility].  Pt's nurse, Morrie Sheldonshley, has been notified, and agrees to send original paperwork along with pt via [facility], and to call report to [phone].  Doylene Canninghomas Antonin Meininger, MA Triage Specialist [phone]
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
## 2015-09-09 NOTE — BH Assessment (Signed)
BHH Assessment Progress Note   Clinician attempted to assess patient.  She was too sleepy to participate in assessment at this time.

## 2015-09-09 NOTE — Progress Notes (Signed)
Patient did not attend the evening speaker AA meeting. Pt was notified that group was beginning but remained in bed.   

## 2015-09-09 NOTE — Progress Notes (Signed)
D:Patient in the bed on approach.  Patient states she is tired and not feeling well.  Patient states she has chronic back pain.  Patient states she wants to go home.  Patient did not state a goal for today.  Patient states she has anxiety and states she is depressed.  Patient denies SI/HI and denies AVH.   A: Staff to monitor Q 15 mins for safety.  Encouragement and support offered.  Scheduled medications administered per orders.  Robaxin administered prn for muscle spasms.   R: Patient remains safe on the unit.  Patient did not attend group tonight.  Patient not visible on the unit tonight.  Patient taking administered medications.

## 2015-09-10 ENCOUNTER — Encounter (HOSPITAL_COMMUNITY): Payer: Self-pay | Admitting: Psychiatry

## 2015-09-10 DIAGNOSIS — F1120 Opioid dependence, uncomplicated: Secondary | ICD-10-CM

## 2015-09-10 DIAGNOSIS — F332 Major depressive disorder, recurrent severe without psychotic features: Principal | ICD-10-CM
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
## 2015-09-10 NOTE — Progress Notes (Signed)
DAR NOTE: Pt present with flat affect and depressed mood in the unit. Pt has been isolating herself and has been bed most of the time and did not go for lunch. Pt denies physical pain, took all his meds as scheduled. As per self inventory, pt had a poor night sleep, poor appetite, low energy, and good concentration. Pt rate depression at 4, hopeless ness at 8, and anxiety at 9. Pt's safety ensured with 15 minute and environmental checks. Pt currently denies SI/HI and A/V hallucinations. Pt verbally agrees to seek staff if SI/HI or A/VH occurs and to consult with staff before acting on these thoughts. Will continue POC.

## 2015-09-10 NOTE — [facility] Group Notes (Signed)
[facility] LCSW Group Therapy  09/10/2015 1:10 PM  Type of Therapy:  Group Therapy  Participation Level:  Did Not Attend-pt invited. Chose to remain in bed.   Modes of Intervention:  Confrontation, Discussion, Education, Exploration, Problem-solving, Rapport Building, Socialization and Support  Summary of Progress/Problems: Today's Topic: Overcoming Obstacles. Patients identified one short term goal and potential obstacles in reaching this goal. Patients processed barriers involved in overcoming these obstacles. Patients identified steps necessary for overcoming these obstacles and explored motivation (internal and external) for facing these difficulties head on.   Smart, Kameren Baade LCSW 09/10/2015, 1:10 PM
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
## 2015-09-10 NOTE — Progress Notes (Signed)
NUTRITION ASSESSMENT  Pt identified as at risk on the Malnutrition Screen Tool  INTERVENTION: 1. Educated patient on the importance of nutrition and encouraged intake of food and beverages. 2. Discussed weight goals. 3. Supplements: continue Ensure Enlive po BID, each supplement provides 350 kcal and 20 grams of protein   NUTRITION DIAGNOSIS: No nutrition-related dx at this time.  Goal: Pt to meet >/= 90% of their estimated nutrition needs.  Monitor:  PO intake  Assessment:  Pt admitted with SI and self-inflicted injuries. She has hx of heroin and was using PTA. Per review, pt has been gaining weight over the past 10 months which is beneficial. She most recently gained 8 lbs in the past 2 months; also beneficial. Ensure Enlive has already been ordered BID for pt.  45 y.o. female  Height: Ht Readings from Last 1 Encounters:  09/09/15 5\' 3"  (1.6 m)    Weight: Wt Readings from Last 1 Encounters:  09/09/15 121 lb (54.885 kg)    Weight Hx: Wt Readings from Last 10 Encounters:  09/09/15 121 lb (54.885 kg)  07/01/15 113 lb 8 oz (51.483 kg)  01/07/15 102 lb 4.8 oz (46.403 kg)  11/08/14 107 lb (48.535 kg)  02/12/14 100 lb 1.4 oz (45.4 kg)  02/06/14 99 lb (44.906 kg)  02/05/14 99 lb (44.906 kg)  02/03/14 99 lb (44.906 kg)  01/10/14 98 lb (44.453 kg)  09/19/13 92 lb 13 oz (42.1 kg)    BMI:  Body mass index is 21.44 kg/(m^2). Pt meets criteria for normal weight based on current BMI.  Estimated Nutritional Needs: Kcal: 25-30 kcal/kg Protein: > 1 gram protein/kg Fluid: 1 ml/kcal  Diet Order: Diet regular Room service appropriate?: Yes; Fluid consistency:: Thin Pt is also offered choice of unit snacks mid-morning and mid-afternoon.  Pt is eating as desired.   Lab results and medications reviewed.     Trenton GammonJessica Tyke Outman, RD, LDN Inpatient Clinical Dietitian Pager # [phone] After hours/weekend pager # [phone]

## 2015-09-10 NOTE — B[facility] Suicide Risk Assessment (Signed)
[facility]   Nursing information obtained from:    Demographic factors:    Current Mental Status:    Loss Factors:    Historical Factors:    Risk Reduction Factors:     Total Time spent with patient: 45 minutes Principal Problem: Depression Diagnosis:   Patient Active Problem List   Diagnosis Date Noted  . Severe episode of recurrent major depressive disorder, without psychotic features (HCC) [F33.2]   . Opioid type dependence, continuous (HCC) [F11.20] 07/02/2015  . Generalized anxiety disorder [F41.1] 07/02/2015  . MDD (major depressive disorder), recurrent episode, severe (HCC) [F33.2] 07/01/2015  . Unspecified mood (affective) disorder (HCC) [F39] 06/28/2015  . Altered consciousness [R40.4]   . CAP (community acquired pneumonia) [J18.9] 01/07/2015  . Dehydration, mild [E86.0] 01/07/2015  . Tobacco abuse [Z72.0] 01/07/2015  . Acute asthmatic bronchitis [J45.901] 01/07/2015  . Major depressive disorder, recurrent severe without psychotic features (HCC) [F33.2]   . Overdose [T50.901A]   . Gait difficulty [R26.9] 02/11/2014  . Unresponsive episode [R40.4] 02/08/2014  . Acute back pain [M54.9] 09/23/2013  . Protein-calorie malnutrition, severe (HCC) [E43] 09/18/2013  . Acute respiratory failure with hypoxia (HCC) [J96.01] 09/18/2013  . Drug overdose [T50.901A] 09/14/2013  . History of suicide attempt [Z91.89] 09/14/2013  . Recurrent Major depressive disorder,severe [F32.9] 09/14/2013  . Anxiety [F41.9] 09/14/2013  . Compression fracture of L1 lumbar vertebra with delayed healing [S32.010G] 09/14/2013  . Lumbar herniated disc [M51.26] 09/14/2013  . Abnormal EKG [R94.31] 09/14/2013  . Suicide attempt by drug ingestion (HCC) [T50.902A] 09/14/2013  . Chronic low back pain [M54.5, G89.29] 08/22/2013  . HNP (herniated nucleus pulposus), lumbar [M51.26] 07/13/2013  . Bipolar disorder (HCC) [F31.9] 09/29/2012   Subjective Data: see admission H and  P  Continued Clinical Symptoms:  Alcohol Use Disorder Identification Test Final Score (AUDIT): 0 The "Alcohol Use Disorders Identification Test", Guidelines for Use in Primary Care, Second Edition.  World Science writerHealth Organization [facility](WHO). Score between 0-7:  no or low risk or alcohol related problems. Score between 8-15:  moderate risk of alcohol related problems. Score between 16-19:  high risk of alcohol related problems. Score 20 or above:  warrants further diagnostic evaluation for alcohol dependence and treatment.   CLINICAL FACTORS:   Depression:   Comorbid alcohol abuse/dependence Alcohol/Substance Abuse/Dependencies  Psychiatric Specialty Exam: ROS  Blood pressure 127/88, pulse 102, temperature 97.8 F (36.6 C), temperature source Oral, resp. rate 16, height 5\' 3"  (1.6 m), weight 54.885 kg (121 lb), SpO2 96 %.Body mass index is 21.44 kg/(m^2).  COGNITIVE FEATURES THAT CONTRIBUTE TO RISK:  Closed-mindedness, Polarized thinking and Thought constriction (tunnel vision)    SUICIDE RISK:   Moderate:  Frequent suicidal ideation with limited intensity, and duration, some specificity in terms of plans, no associated intent, good self-control, limited dysphoria/symptomatology, some risk factors present, and identifiable protective factors, including available and accessible social support.  PLAN OF CARE: see admission H and P  I certify that inpatient services furnished can reasonably be expected to improve the patient's condition.   Rachael FeeLUGO,Latisa Belay A, MD 09/10/2015, 7:57 PM

## 2015-09-10 NOTE — [facility] Group Notes (Signed)
[facility]   09/10/2015 10:53 AM  Participation Quality:  Invited. DID NOT ATTEND. Pt chose to rest in bed   Smart, Rachael Weaber LCSW

## 2015-09-10 NOTE — Progress Notes (Signed)
Pt did not attend evening NA group, pt was in bed.

## 2015-09-10 NOTE — Progress Notes (Signed)
Recreation Therapy Notes  Date: 03.15.2017 Time: 9:30am Location: 300 Hall Group Room   Group Topic: Stress Management  Goal Area(s) Addresses:  Patient will actively participate in stress management techniques presented during session.   Behavioral Response: Did not attend.   Lillybeth Tal L Issaic Welliver, LRT/CTRS        Sina Lucchesi L 09/10/2015 1:50 PM 

## 2015-09-11 MED ORDER — DICYCLOMINE HCL 20 MG PO TABS
20.0000 mg | ORAL_TABLET | Freq: Four times a day (QID) | ORAL | Status: DC | PRN
Start: 2015-09-11 — End: 2015-12-25

## 2015-09-11 MED ORDER — NICOTINE 21 MG/24HR TD PT24: 21.0000 mg | MEDICATED_PATCH | Freq: Every day | TRANSDERMAL | Status: DC

## 2015-09-11 MED ORDER — PREGABALIN 300 MG PO CAPS: 300.0000 mg | ORAL_CAPSULE | Freq: Two times a day (BID) | ORAL | Status: AC

## 2015-09-11 MED ORDER — HYDROXYZINE HCL 25 MG PO TABS: 25.0000 mg | ORAL_TABLET | Freq: Four times a day (QID) | ORAL | Status: DC | PRN

## 2015-09-11 MED ORDER — QUETIAPINE FUMARATE 200 MG PO TABS: 200.0000 mg | ORAL_TABLET | Freq: Every day | ORAL | Status: AC

## 2015-09-11 NOTE — Progress Notes (Signed)
Rachael Ramirez has been visible on the unit.  She is very quiet and reserved.  She denies SI/HI or A/V hallucinations.  She had c/o of diarrhea and imodium given with good relief.  She denies any pain or discomfort and appears to be in no physical distress.  She took her AM medication without difficulty.  She completed her self inventory and reports that her depression is 4/10 and her anxiety and hopelessness are 5/10.  She didn't report on the paperwork what her goal is for today but in talking she stated that she wants "to find hope."  Encouraged continued participation in group and unit activities.  Q 15 minute checks maintained for safety.  We will continue to monitor the progress towards her goals.

## 2015-09-11 NOTE — Progress Notes (Signed)
  [facility] :  Will you be returning to the same living situation after discharge:  Yes,  home with husband At discharge, do you have transportation home?: Yes,  husband Do you have the ability to pay for your medications: Yes,  [employer]  Release of information consent forms completed and submitted to medical records by CSW.  Patient to Follow up at: Follow-up Information    Follow up with [facility]  On 10/28/2015.   Why:  Appt at 10:30AM on this date with Dr. Pincus SanesAgawal for medication management. Please call within 48 hours of appt to cancel or reschedule if needed.    Contact information:   [address] [place], [place] [address] Phone: [phone] Fax: [phone]      Follow up with [facility] On 10/09/2015.   Why:  Arrive on this date at 1:15PM to complete new patient paperwork. [facility]. Please call within 48 hours of appt to cancel or reschedule if needed. MAKE SURE TO BRING INSURANCE CARD TO THIS APPT. Thank you.      Contact information:   [address] [place], [place] [address] Phone: [phone] Fax: [phone]      Next level of care provider has access [facility] Link:no  Safety Planning and Suicide Prevention discussed: Yes,  SPE completed with pt's husband. SPI pamphlet and mobile crisis information provided to pt and she was encouraged to share this with her family.   Have you used any form of tobacco in the last 30 days? (Cigarettes, Smokeless Tobacco, Cigars, and/or Pipes): Yes  Has patient been referred to the Quitline?: Patient refused referral  Patient has been referred for addiction treatment: Yes  Smart, Luisangel Wainright LCSW 09/11/2015, 11:29 AM
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
## 2015-09-11 NOTE — Progress Notes (Signed)
During most of the shift, pt has been in bed.  She did not attend evening group.  When writer spoke with her at the start of the shift, she had gotten up and came to the nurse's station wanting to know when she could get her night medications.  Pt was informed that night meds were given after 9 pm.  She returned to her room and went back to bed.  Writer checked her MAR and saw that she could get prn meds and informed pt at that time.  She received Robaxin, Ativan 1 mg, and Zofran for nausea.  At regular med pass, pt received her scheduled Seroquel 200 mg.  Pt remained in her bed until time for patients to go to bed.  Then she got up requesting more medication at which time she received Vistaril 25 mig which was the only prn available at that time.  Ten minutes later, pt was back at the nurse's station asking for more medication, saying that writer had said she would get orders for more meds which this Clinical research associatewriter never told her.  Pt was informed that she had received all the medications that she could have at that time.  Pt was irritable, saying that writer would not give pt what she needed.  Pt was reminded of all the medications that she had received since 2045.  Pt returned to her room and eventually went to sleep.  Pt denies SI/HI/AVH on assessment.  Support and encouragement offered.  Discharge plans are in process.  Safety maintained with q15 minute checks.

## 2015-09-11 NOTE — Discharge Summary (Signed)
Physician Discharge Summary Note  Patient:  Rachael Ramirez is an 45 y.o., female MRN:  [id number] DOB:  06-Jan-1971 Patient phone:  [phone] (home)  Patient address:   [address] [place] [address],  Total Time spent with patient: 30 minutes  Date of Admission:  09/09/2015 Date of Discharge: 09/11/2015  Reason for Admission:  Depression  Principal Problem: Major depressive disorder, recurrent severe without psychotic features [facility]) Discharge Diagnoses: Patient Active Problem List   Diagnosis Date Noted  . Severe episode of recurrent major depressive disorder, without psychotic features (HCC) [F33.2]   . Opioid type dependence, continuous (HCC) [F11.20] 07/02/2015  . Generalized anxiety disorder [F41.1] 07/02/2015  . MDD (major depressive disorder), recurrent episode, severe (HCC) [F33.2] 07/01/2015  . Unspecified mood (affective) disorder (HCC) [F39] 06/28/2015  . Altered consciousness [R40.4]   . CAP (community acquired pneumonia) [J18.9] 01/07/2015  . Dehydration, mild [E86.0] 01/07/2015  . Tobacco abuse [Z72.0] 01/07/2015  . Acute asthmatic bronchitis [J45.901] 01/07/2015  . Major depressive disorder, recurrent severe without psychotic features (HCC) [F33.2]   . Overdose [T50.901A]   . Gait difficulty [R26.9] 02/11/2014  . Unresponsive episode [R40.4] 02/08/2014  . Acute back pain [M54.9] 09/23/2013  . Protein-calorie malnutrition, severe (HCC) [E43] 09/18/2013  . Acute respiratory failure with hypoxia (HCC) [J96.01] 09/18/2013  . Drug overdose [T50.901A] 09/14/2013  . History of suicide attempt [Z91.89] 09/14/2013  . Anxiety [F41.9] 09/14/2013  . Compression fracture of L1 lumbar vertebra with delayed healing [S32.010G] 09/14/2013  . Lumbar herniated disc [M51.26] 09/14/2013  . Abnormal EKG [R94.31] 09/14/2013  . Suicide attempt by drug ingestion (HCC) [T50.902A] 09/14/2013  . Chronic low back pain [M54.5, G89.29] 08/22/2013  . HNP (herniated nucleus pulposus),  lumbar [M51.26] 07/13/2013  . Bipolar disorder (HCC) [F31.9] 09/29/2012    Past Psychiatric History:  See above noted   Past Medical History:  Past Medical History  Diagnosis Date  . Back pain   . Asthma     uses Combivent daily as needed  . Insomnia     takes Trazodone nightly  . Anxiety     takes Klonopin daily as needed  . PONV (postoperative nausea and vomiting)   . Cough   . Spinal headache   . Weakness     left leg  . GERD (gastroesophageal reflux disease)     takes Omeprazole daily  . Urinary urgency   . Bipolar 1 disorder (HCC)     takes Lamictal daily  . Chronic low back pain     HNP and Radiculopathy  . MRSA (methicillin resistant Staphylococcus aureus)     Postive nasal swab  . Hypertension   . Collapsed lung 3/15    "while in hospital awaiting back OR"  . CAP (community acquired pneumonia) 01/07/2015    Past Surgical History  Procedure Laterality Date  . Percutaneous pinning Right 1992    "hand"  . Anterior cervical decomp/discectomy fusion  1996    "took bone off her hip"  . Elbow fracture surgery Left 1975  . Elbow fracture surgery Left 2002    "redo"  . Lumbar disc surgery  2008 X 2  . Posterior lumbar fusion  2008  . Hardware removal  2008    "took screw out of lower back"  . Epidural block injection    . Lumbar laminectomy/decompression microdiscectomy Left 07/13/2013    Procedure: Left Lumbar four-five microdiskectomy;  Surgeon: Carmela Hurt, MD;  Location: [facility];  Service: Neurosurgery;  Laterality: Left;  Left Lumbar four-five microdiskectomy  . Lumbar wound debridement N/A 09/21/2013    Procedure: LUMBAR WOUND re-exploration and disectomy;  Surgeon: Carmela Hurt, MD;  Location: [facility];  Service: Neurosurgery;  Laterality: N/A;  . Tonsillectomy    . Fracture surgery    . Back surgery     Family History:  Family History  Problem Relation Age of Onset  . Multiple sclerosis Mother   . COPD Father   . Alcohol abuse Maternal  Grandfather   . Alcohol abuse Brother   . Cirrhosis Brother    Family Psychiatric  History: see above noted Social History:  History  Alcohol Use No     History  Drug Use  . Yes  . Special: Heroin    Comment: heroin    Social History   Social History  . Marital Status: Married    Spouse Name: N/A  . Number of Children: 2  . Years of Education: hs   Occupational History  . Disabled    Social History Main Topics  . Smoking status: Current Every Day Smoker -- 1.00 packs/day for 18 years    Types: Cigarettes  . Smokeless tobacco: Never Used  . Alcohol Use: No  . Drug Use: Yes    Special: Heroin     Comment: heroin  . Sexual Activity: Yes   Other Topics Concern  . None   Social History Narrative    Hospital Course:  Rachael Ramirez, 45 y.o. female. Patient has a history of Bipolar I Disorder and Anxiety. She presented to [facility] via EMS after self inflicted lacerations to right and left forearms. Patient reported that she was trying to end her life because, "My family gave up on me".  Patient has made a previous suicide attempt by overdose resulting in a medical admission.  Patient also hospitalized at [facility] 2x's in the past for inpatient mental health treatment.    Rachael Ramirez was admitted for Major depressive disorder, recurrent severe without psychotic features (HCC) and crisis management.  She was treated Seroquel 200 mg HS and Lyrica for pain.  Medical problems were identified and treated as needed.  Home medications were restarted as appropriate.  Improvement was monitored by observation and Rachael Ramirez daily report of symptom reduction.  Emotional and mental status was monitored by daily self inventory reports completed by Rachael Ramirez and clinical staff.  Patient reported continued improvement, denied any new concerns.  Patient had been compliant on medications and denied side effects.  Support and encouragement was provided.    Patient did well during inpatient stay.  At  time of discharge, patient rated both depression and anxiety levels to be manageable and minimal.  Patient was able to identify the triggers of emotional crises and de-stabilizations.        Aviva Wolfer was evaluated by the treatment team for stability and plans for continued recovery upon discharge.  She was offered further treatment options upon discharge including Residential, Intensive Outpatient and Outpatient treatment.  She will follow up with agencies listed below for medication management and counseling.  Encouraged patient to maintain satisfactory support network and home environment.  Advised to adhere to medication compliance and outpatient treatment follow up.      Carnella Fryman motivation was an integral factor for scheduling further treatment.  Employment, transportation, bed availability, health status, family support, and any pending legal issues were also considered during her hospital stay.  Upon completion of this admission the patient was both mentally and  medically stable for discharge denying suicidal/homicidal ideation, auditory/visual/tactile hallucinations, delusional thoughts and paranoia.      Physical Findings: AIMS: Facial and Oral Movements Muscles of Facial Expression: None, normal Lips and Perioral Area: None, normal Jaw: None, normal Tongue: None, normal,Extremity Movements Upper (arms, wrists, hands, fingers): None, normal Lower (legs, knees, ankles, toes): None, normal, Trunk Movements Neck, shoulders, hips: None, normal, Overall Severity Severity of abnormal movements (highest score from questions above): Minimal Incapacitation due to abnormal movements: None, normal Patient's awareness of abnormal movements (rate only patient's report): Aware, no distress, Dental Status Current problems with teeth and/or dentures?: Yes Does patient usually wear dentures?: Yes  CIWA:    COWS:  COWS Total Score: 4  Musculoskeletal: Strength & Muscle Tone: within normal  limits Gait & Station: normal Patient leans: N/A  Psychiatric Specialty Exam:  SEE MD SRA Review of Systems  Neurological: Positive for speech change.    Blood pressure 122/87, pulse 101, temperature 97.7 F (36.5 C), temperature source Oral, resp. rate 16, height 5\' 3"  (1.6 m), weight 54.885 kg (121 lb), SpO2 96 %.Body mass index is 21.44 kg/(m^2).  Have you used any form of tobacco in the last 30 days? (Cigarettes, Smokeless Tobacco, Cigars, and/or Pipes): Yes  Has this patient used any form of tobacco in the last 30 days? (Cigarettes, Smokeless Tobacco, Cigars, and/or Pipes) Yes, Rx given  Blood Alcohol level:  Lab Results  Component Value Date   ETH <5 09/08/2015   ETH <5 06/27/2015    Metabolic Disorder Labs:  No results found for: HGBA1C, MPG No results found for: PROLACTIN No results found for: CHOL, TRIG, HDL, CHOLHDL, VLDL, LDLCALC  See Psychiatric Specialty Exam and Suicide Risk Assessment completed by Attending Physician prior to discharge.  Discharge destination:  Home  Is patient on multiple antipsychotic therapies at discharge:  No   Has Patient had three or more failed trials of antipsychotic monotherapy by history:  No  Recommended Plan for Multiple Antipsychotic Therapies: NA     Medication List    STOP taking these medications        busPIRone 15 MG tablet  Commonly known as:  BUSPAR     COMBIVENT RESPIMAT 20-100 MCG/ACT Aers respimat  Generic drug:  Ipratropium-Albuterol     DULoxetine 60 MG capsule  Commonly known as:  CYMBALTA     LORazepam 0.5 MG tablet  Commonly known as:  ATIVAN     traZODone 100 MG tablet  Commonly known as:  DESYREL      TAKE these medications      Indication   dicyclomine 20 MG tablet  Commonly known as:  BENTYL  Take 1 tablet (20 mg total) by mouth every 6 (six) hours as needed for spasms (abdominal cramping).   Indication:  Irritable Bowel Syndrome     hydrOXYzine 25 MG tablet  Commonly known as:   ATARAX/VISTARIL  Take 1 tablet (25 mg total) by mouth every 6 (six) hours as needed for anxiety.   Indication:  Anxiety Neurosis     nicotine 21 mg/24hr patch  Commonly known as:  NICODERM CQ - dosed in mg/24 hours  Place 1 patch (21 mg total) onto the skin daily.   Indication:  Nicotine Addiction     pregabalin 300 MG capsule  Commonly known as:  LYRICA  Take 1 capsule (300 mg total) by mouth 2 (two) times daily.   Indication:  Generalized Anxiety Disorder     QUEtiapine 200 MG tablet  Commonly  known as:  SEROQUEL  Take 1 tablet (200 mg total) by mouth at bedtime.   Indication:  mood stabilization           Follow-up Information    Follow up with [facility]  On 10/28/2015.   Why:  Appt at 10:30AM on this date with Dr. Pincus Sanes for medication management. Please call within 48 hours of appt to cancel or reschedule if needed.    Contact information:   [address] [place], [place] [address] Phone: [phone] Fax: [phone]      Follow up with [facility] On 10/09/2015.   Why:  Arrive on this date at 1:15PM to complete new patient paperwork. Appt at 2:00PM with [facility] for counseling. Please call within 48 hours of appt to cancel or reschedule if needed. MAKE SURE TO BRING INSURANCE CARD TO THIS APPT. Thank you.      Contact information:   [address] [place], [place] [address] Phone: [phone] Fax: [phone]      Follow-up recommendations:  Activity:  as tol Diet:  as tol  Comments:   1.  Take all your medications as prescribed.   2.  Report any adverse side effects to outpatient provider. 3.  Patient instructed to not use alcohol or illegal drugs while on prescription medicines. 4.  In the event of worsening symptoms, instructed patient to call 911, the crisis hotline or go to nearest emergency room for evaluation of symptoms.  Signed: Lindwood Qua, NP [facility] 09/11/2015, 1:41 PM  I personally assessed the patient and  formulated the plan Madie Reno A. Dub Mikes, M.D.

## 2015-09-11 NOTE — [facility] Suicide Risk Assessment (Signed)
[facility] Discharge Suicide Risk Assessment   Principal Problem: Major depressive disorder, recurrent severe without psychotic features [facility]) Discharge Diagnoses:  Patient Active Problem List   Diagnosis Date Noted  . Severe episode of recurrent major depressive disorder, without psychotic features (HCC) [F33.2]   . Opioid type dependence, continuous (HCC) [F11.20] 07/02/2015  . Generalized anxiety disorder [F41.1] 07/02/2015  . MDD (major depressive disorder), recurrent episode, severe (HCC) [F33.2] 07/01/2015  . Unspecified mood (affective) disorder (HCC) [F39] 06/28/2015  . Altered consciousness [R40.4]   . CAP (community acquired pneumonia) [J18.9] 01/07/2015  . Dehydration, mild [E86.0] 01/07/2015  . Tobacco abuse [Z72.0] 01/07/2015  . Acute asthmatic bronchitis [J45.901] 01/07/2015  . Major depressive disorder, recurrent severe without psychotic features (HCC) [F33.2]   . Overdose [T50.901A]   . Gait difficulty [R26.9] 02/11/2014  . Unresponsive episode [R40.4] 02/08/2014  . Acute back pain [M54.9] 09/23/2013  . Protein-calorie malnutrition, severe (HCC) [E43] 09/18/2013  . Acute respiratory failure with hypoxia (HCC) [J96.01] 09/18/2013  . Drug overdose [T50.901A] 09/14/2013  . History of suicide attempt [Z91.89] 09/14/2013  . Anxiety [F41.9] 09/14/2013  . Compression fracture of L1 lumbar vertebra with delayed healing [S32.010G] 09/14/2013  . Lumbar herniated disc [M51.26] 09/14/2013  . Abnormal EKG [R94.31] 09/14/2013  . Suicide attempt by drug ingestion (HCC) [T50.902A] 09/14/2013  . Chronic low back pain [M54.5, G89.29] 08/22/2013  . HNP (herniated nucleus pulposus), lumbar [M51.26] 07/13/2013  . Bipolar disorder (HCC) [F31.9] 09/29/2012    Total Time spent with patient: 20 minutes  Musculoskeletal: Strength & Muscle Tone: within normal limits Gait & Station: normal Patient leans: Right  Psychiatric Specialty Exam: Review of Systems  Constitutional: Negative.    HENT: Negative.   Eyes: Negative.   Respiratory: Negative.   Cardiovascular: Negative.   Genitourinary: Negative.   Musculoskeletal:       Hand pain  Skin: Negative.   Neurological: Negative.   Endo/Heme/Allergies: Negative.   Psychiatric/Behavioral: Positive for substance abuse.    Blood pressure 122/87, pulse 101, temperature 97.7 F (36.5 C), temperature source Oral, resp. rate 16, height  (1.6 m), weight 54.885 kg (121 lb), SpO2 96 %.Body mass index is 21.44 kg/(m^2).  General Appearance: Fairly Groomed  Patent attorney::  Fair  Speech:  Clear and Coherent409  Volume:  Normal  Mood:  Euthymic  Affect:  Appropriate  Thought Process:  Coherent and Goal Directed  Orientation:  Full (Time, Place, and Person)  Thought Content:  plans as she moves on, relapse prevention plan  Suicidal Thoughts:  No  Homicidal Thoughts:  No  Memory:  Immediate;   Fair Recent;   Fair Remote;   Fair  Judgement:  Fair  Insight:  Present  Psychomotor Activity:  Normal  Concentration:  Fair  Recall:  [employer] of Knowledge:Fair  Language: Fair  Akathisia:  No  Handed:  Right  AIMS (if indicated):     Assets:  Desire for Improvement  Sleep:  Number of Hours: 5  Cognition: WNL  ADL's:  Intact  In full contact with reality. There are no active SI plans or intent. There are no active S/S of withdrawal. She is willing and motivated to pursue outpatient treatment and work on long term abstinence Mental Status Per Nursing Assessment::   On Admission:     Demographic Factors:  Caucasian  Loss Factors: none identified  Historical Factors: none identified   Risk Reduction Factors:   Sense of responsibility to family, Living with another person, especially a relative and  Positive social support  Continued Clinical Symptoms:  Alcohol/Substance Abuse/Dependencies  Cognitive Features That Contribute To Risk:  None    Suicide Risk:  Minimal: No identifiable suicidal ideation.  Patients  presenting with no risk factors but with morbid ruminations; may be classified as minimal risk based on the severity of the depressive symptoms  Follow-up Information    Follow up with [facility]  On 10/28/2015.   Why:  Appt at 10:30AM on this date with Dr. Pincus SanesAgawal for medication management. Please call within 48 hours of appt to cancel or reschedule if needed.    Contact information:   [address] [place], [place] [address] Phone: [phone] Fax: [phone]      Follow up with [facility] On 10/09/2015.   Why:  Arrive on this date at 1:15PM to complete new patient paperwork. [facility]. Please call within 48 hours of appt to cancel or reschedule if needed. MAKE SURE TO BRING INSURANCE CARD TO THIS APPT. Thank you.      Contact information:   [address] [place], [place] [address] Phone: [phone] Fax: [phone]      Plan Of Care/Follow-up recommendations:  Activity:  as tolerated Diet:  regular Follow up as above Dennys Guin A, MD 09/11/2015, 2:10 PM

## 2015-09-11 NOTE — Progress Notes (Signed)
Pt. D/C from the unit to lobby accompanied by husband.  She was pleasant and cooperative. She voiced no SI/HI or A/V halluciations.  Pt. Denies any pain or discomfort.  D/C instructions and medications reviewed with pt.  Pt. verbalized understanding of medications and d/c instructions.   Prescriptions and follow up appointments sent with patient.  All belongings (from locker 31-green cell phone, rubber bracelet, black pocketbook, black sunglasses, hair brush, ploy grip, checkbook, [employer] insurance cards-2, $20.00, Eucerin cream, 2 lighters and loose change) returned to pt. Q 15 min checks maintained until discharge.  Pt. Left the unit in no apparent distress.

## 2015-09-11 NOTE — [facility] Group Notes (Signed)
[facility] Group Notes:  (Nursing/MHT/Case Management/Adjunct)  Date:  09/11/2015  Time:  930am  Type of Therapy:  Nurse Education  Participation Level:  Minimal  Participation Quality:  Attentive  Affect:  Blunted  Cognitive:  Alert and Appropriate  Insight:  Improving  Engagement in Group:  Lacking  Modes of Intervention:  Discussion and Education  Summary of Progress/Problems:  Group topic was Leisure and lifestyle changes.  Discussed making positive changes with leisure activities.  Discussed importance of daily goals.  She voiced that her goal for today is "to find hope."  Norm ParcelHeather V Aminah Zabawa 09/11/2015, 1:17 PM

## 2015-09-11 NOTE — [facility] Suicide Risk Assessment (Signed)
[facility] INPATIENT:  Family/Significant Other Suicide Prevention Education  Suicide Prevention Education:  Education Completed; Rachael Ramirez (pt's husband) [phone] has been identified by the patient as the family member/significant other with whom the patient will be residing, and identified as the person(s) who will aid the patient in the event of a mental health crisis (suicidal ideations/suicide attempt).  With written consent from the patient, the family member/significant other has been provided the following suicide prevention education, prior to the and/or following the discharge of the patient.  The suicide prevention education provided includes the following:  Suicide risk factors  Suicide prevention and interventions  National Suicide Hotline telephone number  [facility] assessment telephone number  [facility] Emergency Assistance 911  [facility] and/or Residential Mobile Crisis Unit telephone number  Request made of family/significant other to:  Remove weapons (e.g., guns, rifles, knives), all items previously/currently identified as safety concern.    Remove drugs/medications (over-the-counter, prescriptions, illicit drugs), all items previously/currently identified as a safety concern.  The family member/significant other verbalizes understanding of the suicide prevention education information provided.  The family member/significant other agrees to remove the items of safety concern listed above.  Smart, Rachael Rothgeb LCSW 09/11/2015, 11:23 AM

## 2015-10-09 ENCOUNTER — Ambulatory Visit (HOSPITAL_COMMUNITY): Payer: Self-pay | Admitting: Clinical

## 2015-10-28 ENCOUNTER — Ambulatory Visit (HOSPITAL_COMMUNITY): Payer: Self-pay | Admitting: Psychiatry

## 2015-12-11 ENCOUNTER — Encounter (HOSPITAL_COMMUNITY): Payer: Self-pay | Admitting: *Deleted

## 2015-12-11 ENCOUNTER — Emergency Department (HOSPITAL_COMMUNITY): Payer: Managed Care, Other (non HMO)

## 2015-12-11 ENCOUNTER — Emergency Department (HOSPITAL_COMMUNITY)
Admission: EM | Admit: 2015-12-11 | Discharge: 2015-12-11 | Discharge status: Against Medical Advice | Payer: Managed Care, Other (non HMO) | Attending: Emergency Medicine | Admitting: Emergency Medicine

## 2015-12-11 DIAGNOSIS — J45909 Unspecified asthma, uncomplicated: Secondary | ICD-10-CM | POA: Diagnosis not present

## 2015-12-11 DIAGNOSIS — T426X1A Poisoning by other antiepileptic and sedative-hypnotic drugs, accidental (unintentional), initial encounter: Secondary | ICD-10-CM | POA: Insufficient documentation

## 2015-12-11 DIAGNOSIS — T424X1A Poisoning by benzodiazepines, accidental (unintentional), initial encounter: Secondary | ICD-10-CM | POA: Insufficient documentation

## 2015-12-11 DIAGNOSIS — F17210 Nicotine dependence, cigarettes, uncomplicated: Secondary | ICD-10-CM | POA: Insufficient documentation

## 2015-12-11 DIAGNOSIS — T402X1A Poisoning by other opioids, accidental (unintentional), initial encounter: Secondary | ICD-10-CM | POA: Diagnosis not present

## 2015-12-11 DIAGNOSIS — R404 Transient alteration of awareness: Secondary | ICD-10-CM

## 2015-12-11 DIAGNOSIS — I10 Essential (primary) hypertension: Secondary | ICD-10-CM | POA: Diagnosis not present

## 2015-12-11 DIAGNOSIS — Z79899 Other long term (current) drug therapy: Secondary | ICD-10-CM | POA: Insufficient documentation

## 2015-12-11 DIAGNOSIS — R4182 Altered mental status, unspecified: Secondary | ICD-10-CM | POA: Diagnosis present

## 2015-12-11 DIAGNOSIS — R4189 Other symptoms and signs involving cognitive functions and awareness: Secondary | ICD-10-CM

## 2015-12-11 LAB — I-STAT CHEM 8, ED
BUN: 4 mg/dL — AB (ref 6–20)
CALCIUM ION: 1.11 mmol/L — AB (ref 1.12–1.23)
CREATININE: 0.8 mg/dL (ref 0.44–1.00)
Chloride: 103 mmol/L (ref 101–111)
Glucose, Bld: 92 mg/dL (ref 65–99)
HEMATOCRIT: 40 % (ref 36.0–46.0)
Hemoglobin: 13.6 g/dL (ref 12.0–15.0)
Potassium: 3.2 mmol/L — ABNORMAL LOW (ref 3.5–5.1)
Sodium: 142 mmol/L (ref 135–145)
TCO2: 28 mmol/L (ref 0–100)

## 2015-12-11 LAB — CBC WITH DIFFERENTIAL/PLATELET
BASOS ABS: 0 10*3/uL (ref 0.0–0.1)
BASOS PCT: 1 %
EOS ABS: 0.1 10*3/uL (ref 0.0–0.7)
EOS PCT: 2 %
HCT: 37.6 % (ref 36.0–46.0)
HEMOGLOBIN: 12.3 g/dL (ref 12.0–15.0)
LYMPHS ABS: 1.2 10*3/uL (ref 0.7–4.0)
Lymphocytes Relative: 29 %
MCH: 31.5 pg (ref 26.0–34.0)
MCHC: 32.7 g/dL (ref 30.0–36.0)
MCV: 96.2 fL (ref 78.0–100.0)
Monocytes Absolute: 0.3 10*3/uL (ref 0.1–1.0)
Monocytes Relative: 8 %
NEUTROS PCT: 60 %
Neutro Abs: 2.4 10*3/uL (ref 1.7–7.7)
PLATELETS: 216 10*3/uL (ref 150–400)
RBC: 3.91 MIL/uL (ref 3.87–5.11)
RDW: 16 % — ABNORMAL HIGH (ref 11.5–15.5)
WBC: 4 10*3/uL (ref 4.0–10.5)

## 2015-12-11 LAB — I-STAT BETA HCG BLOOD, ED (MC, WL, AP ONLY)

## 2015-12-11 LAB — URINE MICROSCOPIC-ADD ON: WBC, UA: NONE SEEN WBC/hpf (ref 0–5)

## 2015-12-11 LAB — RAPID URINE DRUG SCREEN, HOSP PERFORMED
AMPHETAMINES: POSITIVE — AB
BARBITURATES: NOT DETECTED
BENZODIAZEPINES: POSITIVE — AB
Cocaine: NOT DETECTED
Opiates: NOT DETECTED
Tetrahydrocannabinol: NOT DETECTED

## 2015-12-11 LAB — SALICYLATE LEVEL: Salicylate Lvl: <4 mg/dL (ref 2.8–30.0)

## 2015-12-11 LAB — COMPREHENSIVE METABOLIC PANEL
ALBUMIN: 3.3 g/dL — AB (ref 3.5–5.0)
ALK PHOS: 62 U/L (ref 38–126)
ALT: 14 U/L (ref 14–54)
AST: 18 U/L (ref 15–41)
Anion gap: 9 (ref 5–15)
BUN: <5 mg/dL — ABNORMAL LOW (ref 6–20)
CALCIUM: 8.7 mg/dL — AB (ref 8.9–10.3)
CHLORIDE: 108 mmol/L (ref 101–111)
CO2: 25 mmol/L (ref 22–32)
Creatinine, Ser: 0.77 mg/dL (ref 0.44–1.00)
GFR calc non Af Amer: >60 mL/min (ref >60)
GLUCOSE: 91 mg/dL (ref 65–99)
Potassium: 3.2 mmol/L — ABNORMAL LOW (ref 3.5–5.1)
SODIUM: 142 mmol/L (ref 135–145)
Total Bilirubin: 0.3 mg/dL (ref 0.3–1.2)
Total Protein: 6 g/dL — ABNORMAL LOW (ref 6.5–8.1)

## 2015-12-11 LAB — URINALYSIS, ROUTINE W REFLEX MICROSCOPIC
BILIRUBIN URINE: NEGATIVE
GLUCOSE, UA: NEGATIVE mg/dL
KETONES UR: NEGATIVE mg/dL
Leukocytes, UA: NEGATIVE
Nitrite: NEGATIVE
PH: 6 (ref 5.0–8.0)
Protein, ur: NEGATIVE mg/dL
Specific Gravity, Urine: 1.006 (ref 1.005–1.030)

## 2015-12-11 LAB — I-STAT VENOUS BLOOD GAS, ED
ACID-BASE EXCESS: 5 mmol/L — AB (ref 0.0–2.0)
BICARBONATE: 30 meq/L — AB (ref 20.0–24.0)
O2 SAT: 90 %
PO2 VEN: 58 mmHg — AB (ref 31.0–45.0)
TCO2: 31 mmol/L (ref 0–100)
pCO2, Ven: 45.9 mmHg (ref 45.0–50.0)
pH, Ven: 7.424 — ABNORMAL HIGH (ref 7.250–7.300)

## 2015-12-11 LAB — ETHANOL: Alcohol, Ethyl (B): <5 mg/dL (ref <5)

## 2015-12-11 LAB — CBG MONITORING, ED: GLUCOSE-CAPILLARY: 101 mg/dL — AB (ref 65–99)

## 2015-12-11 LAB — ACETAMINOPHEN LEVEL: Acetaminophen (Tylenol), Serum: <10 ug/mL — ABNORMAL LOW (ref 10–30)

## 2015-12-11 MED ORDER — NALOXONE HCL 2 MG/2ML IJ SOSY
PREFILLED_SYRINGE | INTRAMUSCULAR | Status: AC
  Filled 2015-12-11: qty 2

## 2015-12-11 MED ORDER — PROPOFOL 1000 MG/100ML IV EMUL
5.0000 ug/kg/min | Freq: Once | INTRAVENOUS | Status: AC
  Administered 2015-12-11: 1000 mg via INTRAVENOUS
  Administered 2015-12-11: 40 ug/kg/min via INTRAVENOUS

## 2015-12-11 MED ORDER — SODIUM CHLORIDE 0.9 % IV BOLUS (SEPSIS)
1000.0000 mL | Freq: Once | INTRAVENOUS | Status: AC
  Administered 2015-12-11: 1000 mL via INTRAVENOUS

## 2015-12-11 MED ORDER — ETOMIDATE 2 MG/ML IV SOLN
INTRAVENOUS | Status: DC | PRN
Start: 2015-12-11 — End: 2015-12-11
  Administered 2015-12-11: 10 mg via INTRAVENOUS

## 2015-12-11 MED ORDER — NALOXONE HCL 2 MG/2ML IJ SOSY
2.0000 mg | PREFILLED_SYRINGE | Freq: Once | INTRAMUSCULAR | Status: AC
  Administered 2015-12-11: 2 mg via INTRAVENOUS

## 2015-12-11 MED ORDER — NALOXONE HCL 0.4 MG/ML IJ SOLN
INTRAMUSCULAR | Status: AC
  Filled 2015-12-11: qty 1

## 2015-12-11 MED ORDER — NALOXONE HCL 0.4 MG/ML IJ SOLN
0.4000 mg | Freq: Once | INTRAMUSCULAR | Status: AC
Start: 2015-12-11 — End: 2015-12-11
  Administered 2015-12-11: 0.4 mg via INTRAVENOUS

## 2015-12-11 MED ORDER — MAGIC MOUTHWASH
5.0000 mL | Freq: Once | ORAL | Status: DC
  Filled 2015-12-11: qty 5

## 2015-12-11 MED ORDER — SUCCINYLCHOLINE CHLORIDE 20 MG/ML IJ SOLN
INTRAMUSCULAR | Status: DC | PRN
  Administered 2015-12-11: 100 mg via INTRAVENOUS

## 2015-12-11 MED ORDER — PROPOFOL 1000 MG/100ML IV EMUL
INTRAVENOUS | Status: AC
  Administered 2015-12-11: 1000 mg via INTRAVENOUS
  Filled 2015-12-11: qty 100

## 2015-12-11 MED ORDER — NALOXONE HCL 2 MG/2ML IJ SOSY
2.0000 mg | PREFILLED_SYRINGE | Freq: Once | INTRAMUSCULAR | Status: AC
  Administered 2015-12-11: 2 mg via INTRAVENOUS
  Filled 2015-12-11: qty 2

## 2015-12-11 NOTE — ED Notes (Signed)
Pt denies attempting to harm herself, denies intentional overdose, denies taking too much medication

## 2015-12-11 NOTE — ED Provider Notes (Addendum)
CSN: [id number]     Arrival date & time 12/11/15  1244 History   First MD Initiated Contact with Patient 12/11/15 1257     No chief complaint on file.    (Consider location/radiation/quality/duration/timing/severity/associated sxs/prior Treatment) HPI Comments: 45 year old female with history of drug abuse, bipolar disorder presents as a presumed overdose. Per EMS they were called out to the home and family said that the patient had been awake and normal sitting on the couch when suddenly she went unresponsive. EMS bagged the patient for 20 minutes. They gave her some Narcan with improvement in respirations and she was able to breathe on her own. No reported trauma.   Past Medical History  Diagnosis Date  . Back pain   . Asthma     uses Combivent daily as needed  . Insomnia     takes Trazodone nightly  . Anxiety     takes Klonopin daily as needed  . PONV (postoperative nausea and vomiting)   . Cough   . Spinal headache   . Weakness     left leg  . GERD (gastroesophageal reflux disease)     takes Omeprazole daily  . Urinary urgency   . Bipolar 1 disorder (HCC)     takes Lamictal daily  . Chronic low back pain     HNP and Radiculopathy  . MRSA (methicillin resistant Staphylococcus aureus)     Postive nasal swab  . Hypertension   . Collapsed lung 3/15    "while in hospital awaiting back OR"  . CAP (community acquired pneumonia) 01/07/2015   Past Surgical History  Procedure Laterality Date  . Percutaneous pinning Right 1992    "hand"  . Anterior cervical decomp/discectomy fusion  1996    "took bone off her hip"  . Elbow fracture surgery Left 1975  . Elbow fracture surgery Left 2002    "redo"  . Lumbar disc surgery  2008 X 2  . Posterior lumbar fusion  2008  . Hardware removal  2008    "took screw out of lower back"  . Epidural block injection    . Lumbar laminectomy/decompression microdiscectomy Left 07/13/2013    Procedure: Left Lumbar four-five microdiskectomy;   Surgeon: Carmela Hurt, MD;  Location: [facility];  Service: Neurosurgery;  Laterality: Left;  Left Lumbar four-five microdiskectomy  . Lumbar wound debridement N/A 09/21/2013    Procedure: LUMBAR WOUND re-exploration and disectomy;  Surgeon: Carmela Hurt, MD;  Location: [facility];  Service: Neurosurgery;  Laterality: N/A;  . Tonsillectomy    . Fracture surgery    . Back surgery     Family History  Problem Relation Age of Onset  . Multiple sclerosis Mother   . COPD Father   . Alcohol abuse Maternal Grandfather   . Alcohol abuse Brother   . Cirrhosis Brother    Social History  Substance Use Topics  . Smoking status: Current Every Day Smoker -- 1.00 packs/day for 18 years    Types: Cigarettes  . Smokeless tobacco: Never Used  . Alcohol Use: No   OB History    No data available     Review of Systems  Unable to perform ROS: Mental status change      Allergies  Minocycline and Tramadol  Home Medications   Prior to Admission medications   Medication Sig Start Date End Date Taking? Authorizing Provider  dicyclomine (BENTYL) 20 MG tablet Take 1 tablet (20 mg total) by mouth every 6 (six) hours as needed  for spasms (abdominal cramping). 09/11/15   Adonis Brook, NP  hydrOXYzine (ATARAX/VISTARIL) 25 MG tablet Take 1 tablet (25 mg total) by mouth every 6 (six) hours as needed for anxiety. 09/11/15   Adonis Brook, NP  nicotine (NICODERM CQ - DOSED IN MG/24 HOURS) 21 mg/24hr patch Place 1 patch (21 mg total) onto the skin daily. 09/11/15   Adonis Brook, NP  pregabalin (LYRICA) 300 MG capsule Take 1 capsule (300 mg total) by mouth 2 (two) times daily. 09/11/15   Adonis Brook, NP  QUEtiapine (SEROQUEL) 200 MG tablet Take 1 tablet (200 mg total) by mouth at bedtime. 09/11/15   Adonis Brook, NP   BP 115/97 mmHg  Pulse 80  Temp(Src) 98.3 F (36.8 C) (Oral)  Resp 18  Wt 121 lb 4.1 oz (55 kg)  SpO2 100%  LMP  Physical Exam  Constitutional: She appears well-developed and  well-nourished.  HENT:  Head: Normocephalic and atraumatic.  Right Ear: External ear normal.  Left Ear: External ear normal.  Nose: Nose normal.  Mouth/Throat: Oropharynx is clear and moist. No oropharyngeal exudate.  Eyes: EOM are normal. Pupils are equal, round, and reactive to light.  Neck: Normal range of motion. Neck supple.  Cardiovascular: Normal rate, regular rhythm, normal heart sounds and intact distal pulses.   No murmur heard. Pulmonary/Chest: Effort normal. Bradypnea noted. She has decreased breath sounds. She has no wheezes. She has no rales.  Abdominal: Soft. She exhibits no distension. There is no tenderness.  Musculoskeletal: Normal range of motion. She exhibits no edema or tenderness.  Neurological: She exhibits abnormal muscle tone. GCS eye subscore is 4. GCS verbal subscore is 1. GCS motor subscore is 1.  Skin: Skin is warm and dry. No rash noted.  Vitals reviewed.   ED Course  .Intubation Date/Time: 12/11/2015 3:17 PM Performed by: Tyrone Apple ROE Authorized by: Leta Baptist Consent: The procedure was performed in an emergent situation. Required items: required blood products, implants, devices, and special equipment available Patient identity confirmed: arm band Indications: airway protection Intubation method: video-assisted Patient status: paralyzed (RSI) Preoxygenation: BVM Sedatives: etomidate Paralytic: succinylcholine Laryngoscope size: Miller 3 Tube size: 7.5 mm Tube type: cuffed Number of attempts: 2 Ventilation between attempts: BVM Cricoid pressure: no Cords visualized: yes Post-procedure assessment: chest rise and ETCO2 monitor Breath sounds: equal Cuff inflated: yes ETT to lip: 22 cm Tube secured with: ETT holder Chest x-ray interpreted by me and radiologist. Chest x-ray findings: endotracheal tube in appropriate position Patient tolerance: Patient tolerated the procedure well with no immediate complications   CRITICAL  CARE Performed by: Larna Daughters   Total critical care time: 75 minutes  Critical care time was exclusive of separately billable procedures and treating other patients.  Critical care was necessary to treat or prevent imminent or life-threatening deterioration.  Critical care was time spent personally by me on the following activities: development of treatment plan with patient and/or surrogate as well as nursing, discussions with consultants, evaluation of patient's response to treatment, examination of patient, obtaining history from patient or surrogate, ordering and performing treatments and interventions, ordering and review of laboratory studies, ordering and review of radiographic studies, pulse oximetry and re-evaluation of patient's condition.     (including critical care time) Labs Review Labs Reviewed  CBC WITH DIFFERENTIAL/PLATELET - Abnormal; Notable for the following:    RDW 16.0 (*)    All other components within normal limits  COMPREHENSIVE METABOLIC PANEL - Abnormal; Notable for the following:  Potassium 3.2 (*)    BUN <5 (*)    Calcium 8.7 (*)    Total Protein 6.0 (*)    Albumin 3.3 (*)    All other components within normal limits  ACETAMINOPHEN LEVEL - Abnormal; Notable for the following:    Acetaminophen (Tylenol), Serum <10 (*)    All other components within normal limits  I-STAT VENOUS BLOOD GAS, ED - Abnormal; Notable for the following:    pH, Ven 7.424 (*)    pO2, Ven 58.0 (*)    Bicarbonate 30.0 (*)    Acid-Base Excess 5.0 (*)    All other components within normal limits  I-STAT CHEM 8, ED - Abnormal; Notable for the following:    Potassium 3.2 (*)    BUN 4 (*)    Calcium, Ion 1.11 (*)    All other components within normal limits  CBG MONITORING, ED - Abnormal; Notable for the following:    Glucose-Capillary 101 (*)    All other components within normal limits  ETHANOL  SALICYLATE LEVEL  URINE RAPID DRUG SCREEN, HOSP PERFORMED  URINALYSIS,  ROUTINE W REFLEX MICROSCOPIC (NOT AT [facility])  BLOOD GAS, VENOUS  I-STAT BETA HCG BLOOD, ED ([facility], [facility], [facility] ONLY)    Imaging Review Ct Head Wo Contrast  12/11/2015  CLINICAL DATA:  Unresponsive with concern for drug overdose EXAM: CT HEAD WITHOUT CONTRAST TECHNIQUE: Contiguous axial images were obtained from the base of the skull through the vertex without intravenous contrast. COMPARISON:  June 28, 2015 FINDINGS: The ventricles are normal in size and configuration. There is no intracranial mass, hemorrhage, extra-axial fluid collection, or midline shift. Gray-white compartments are normal. No acute infarct evident. The bony calvarium appears intact. The mastoid air cells are clear. No intraorbital lesions are evident. There is PICC thickening in several ethmoid air cells bilaterally. There is obstruction of the right near ease with nasal turbinate edema on the right. IMPRESSION: Obstruction right areas with nasal tripped in the right. There is opacification several ethmoid air cells chronic. There is no intracranial mass, hemorrhage, or focal gray - white compartment lesions/evidence of acute infarct. Electronically Signed   By: Bretta BangWilliam  Woodruff III M.D.   On: 12/11/2015 14:33   Dg Chest Portable 1 View  12/11/2015  CLINICAL DATA:  Endotracheal tube placement EXAM: PORTABLE CHEST 1 VIEW COMPARISON:  06/29/2015 FINDINGS: Cardiomediastinal silhouette is stable. No acute infiltrate or pulmonary edema. There is NG tube in place. The tip of the NG tube is not included in the film probable within distal stomach. Endotracheal tube in place with tip 3 cm above the carina. No pneumothorax. IMPRESSION: No active disease.  Endotracheal and NG tube in place. Electronically Signed   By: Natasha MeadLiviu  Pop M.D.   On: 12/11/2015 14:10   I have personally reviewed and evaluated these images and lab results as part of my medical decision-making.   EKG Interpretation None      MDM  Patient presented to the emergency  department unresponsive.  Patient with multiple medications at home including Lyrica, hydrocodone/acetaminophen, Klonopin which have all been filled within the last days and are essentially empty. We attempted to wake the patient multiple times and gave her multiple doses of Narcan without her becoming unresponsive in the emergency department. After a period of observation the decision was made to intubate her for airway protection. Patient was intubated using RSI. Shortly after intubation the patient became combative trying to pull out her ET tube. She continued to try to pull  out her ET tube despite being on propofol. She then started interacting answering questions and following commands. Patient appeared stable and alert. At this time the patient was extubated successfully. She tolerated extubation well. She remained alert oriented and awake. She was following commands. She could tell me where exactly she was as well as the exact day. She continued to not denied taking any extra medications. I called to have the patient admitted to medicine for observation and continued management. Patient requested to be discharged AMA because we would not give her pain medication. I told her that she could have died earlier and that she had to have EMS breathe for her. She expressed understanding of the grave nature of what happened earlier but continued to request to leave AMA. I asked that she stay and be evaluated by TTS. She denied any suicidal or or homicidal ideations. I told the patient I was concerned in that medically she should stay for observation and further management. She continued to refuse to stay. At this time I do not have any criteria to IVC the patient. Despite multiple attempts to convince the patient to stay she continued to request to leave AMA and was signed out AMA while I was checking on another patient. Final diagnoses:  None    1. Episode of unresponsiveness    Leta Baptist,  MD 12/11/15 1659  Leta Baptist, MD 12/11/15 [phone]

## 2015-12-11 NOTE — ED Notes (Signed)
Doctor at bedside.

## 2015-12-11 NOTE — ED Notes (Signed)
Dr Cyndie ChimeNguyen does not feel pt meets ivc criteria.
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
## 2015-12-11 NOTE — ED Notes (Signed)
Patient following commands. Ordered to stopped propofol per EDP at bedside. Doctor assessing patient to extubate patient.

## 2015-12-11 NOTE — ED Notes (Signed)
Rachael ChimeNguyen, MD at bedside speaking with pt who is wanting to leave AMA, will continue to monitor

## 2015-12-11 NOTE — ED Notes (Signed)
Pt in via [facility] per report pt noted to go unresponsive while sitting on the sofa, witnessed by family, reported hx of similar episodes per family, pt vitals WNL except RR which pt required 20 mins of assisted ventilations via BVM, pt GCS 8 upon arrival to ED, pt reported to have empty Hydrocodone bottles & Klonopin bottle at bedside with other medications

## 2015-12-11 NOTE — Procedures (Signed)
Extubation Procedure Note  Patient Details:   Name: Rachael Ramirez DOB: 09/10/1970 MRN: [id number]   Airway Documentation:     Evaluation  O2 sats: stable throughout Complications: No apparent complications Patient did tolerate procedure well. Bilateral Breath Sounds: Clear   Yes   Patient was extubated due to being alert and oriented per ED MD. Cuff leak was heard. No stridor was noted. Patient was placed on a 4L [place]. RN and MD were at the bedside with RT during extubation. RT will continue to monitor.  Darolyn Ruashley M Navaeh Kehres 12/11/2015, 3:10 PM

## 2015-12-23 ENCOUNTER — Emergency Department (HOSPITAL_COMMUNITY): Payer: Managed Care, Other (non HMO)

## 2015-12-23 ENCOUNTER — Emergency Department (HOSPITAL_COMMUNITY)
Admission: EM | Admit: 2015-12-23 | Discharge: 2015-12-23 | Disposition: A | Discharge status: Home / Self Care | Payer: Managed Care, Other (non HMO) | Attending: Emergency Medicine | Admitting: Emergency Medicine

## 2015-12-23 ENCOUNTER — Encounter (HOSPITAL_COMMUNITY): Payer: Self-pay | Admitting: Emergency Medicine

## 2015-12-23 DIAGNOSIS — F17210 Nicotine dependence, cigarettes, uncomplicated: Secondary | ICD-10-CM | POA: Diagnosis not present

## 2015-12-23 DIAGNOSIS — M546 Pain in thoracic spine: Secondary | ICD-10-CM

## 2015-12-23 DIAGNOSIS — F319 Bipolar disorder, unspecified: Secondary | ICD-10-CM | POA: Diagnosis not present

## 2015-12-23 DIAGNOSIS — M5442 Lumbago with sciatica, left side: Secondary | ICD-10-CM | POA: Diagnosis not present

## 2015-12-23 DIAGNOSIS — J45909 Unspecified asthma, uncomplicated: Secondary | ICD-10-CM | POA: Diagnosis not present

## 2015-12-23 DIAGNOSIS — Z79899 Other long term (current) drug therapy: Secondary | ICD-10-CM | POA: Diagnosis not present

## 2015-12-23 DIAGNOSIS — M25852 Other specified joint disorders, left hip: Secondary | ICD-10-CM | POA: Diagnosis not present

## 2015-12-23 DIAGNOSIS — I10 Essential (primary) hypertension: Secondary | ICD-10-CM | POA: Insufficient documentation

## 2015-12-23 DIAGNOSIS — M549 Dorsalgia, unspecified: Secondary | ICD-10-CM | POA: Diagnosis present

## 2015-12-23 LAB — POC URINE PREG, ED: Preg Test, Ur: NEGATIVE

## 2015-12-23 MED ORDER — HYDROMORPHONE HCL 2 MG/ML IJ SOLN
2.0000 mg | Freq: Once | INTRAMUSCULAR | Status: AC
Start: 2015-12-23 — End: 2015-12-23
  Administered 2015-12-23: 2 mg via INTRAMUSCULAR
  Filled 2015-12-23: qty 1

## 2015-12-23 MED ORDER — NAPROXEN 500 MG PO TABS
500.0000 mg | ORAL_TABLET | Freq: Once | ORAL | Status: AC
  Administered 2015-12-23: 500 mg via ORAL
  Filled 2015-12-23: qty 1

## 2015-12-23 MED ORDER — METHOCARBAMOL 500 MG PO TABS: 500.0000 mg | ORAL_TABLET | Freq: Two times a day (BID) | ORAL | Status: AC

## 2015-12-23 MED ORDER — NAPROXEN 500 MG PO TABS: 500.0000 mg | ORAL_TABLET | Freq: Two times a day (BID) | ORAL | Status: AC | PRN

## 2015-12-23 MED ORDER — DIAZEPAM 2 MG PO TABS
2.0000 mg | ORAL_TABLET | Freq: Once | ORAL | Status: AC
  Administered 2015-12-23: 2 mg via ORAL
  Filled 2015-12-23: qty 1
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
## 2015-12-23 NOTE — ED Notes (Signed)
Bed: WA06 Expected date:  Expected time:  Means of arrival:  Comments: EMS-back pain 

## 2015-12-23 NOTE — ED Notes (Signed)
Per EMS-states chronic back pain-was seen for the same on the 15th-drugs found in system so PCP will not give pain meds-called EMS for same symptoms last night but refused transport

## 2015-12-23 NOTE — Discharge Instructions (Signed)
We saw you in the ER for back pain. The imaging in the exam is normal, and our exam don't indicate any spinal cord involvement - and thus we feel comfortable sending you home. Please take the meds prescribed for the next 2 days, take the muscle relaxant as needed, and see your primary care doctor for further pain control.  Please use the back exercises to strengthen the back muscles.   Back Pain, Adult Back pain is very common in adults.The cause of back pain is rarely dangerous and the pain often gets better over time.The cause of your back pain may not be known. Some common causes of back pain include:  Strain of the muscles or ligaments supporting the spine.  Wear and tear (degeneration) of the spinal disks.  Arthritis.  Direct injury to the back. For many people, back pain may return. Since back pain is rarely dangerous, most people can learn to manage this condition on their own. HOME CARE INSTRUCTIONS Watch your back pain for any changes. The following actions may help to lessen any discomfort you are feeling:  Remain active. It is stressful on your back to sit or stand in one place for long periods of time. Do not sit, drive, or stand in one place for more than 30 minutes at a time. Take short walks on even surfaces as soon as you are able.Try to increase the length of time you walk each day.  Exercise regularly as directed by your health care provider. Exercise helps your back heal faster. It also helps avoid future injury by keeping your muscles strong and flexible.  Do not stay in bed.Resting more than 1-2 days can delay your recovery.  Pay attention to your body when you bend and lift. The most comfortable positions are those that put less stress on your recovering back. Always use proper lifting techniques, including:  Bending your knees.  Keeping the load close to your body.  Avoiding twisting.  Find a comfortable position to sleep. Use a firm mattress and lie on  your side with your knees slightly bent. If you lie on your back, put a pillow under your knees.  Avoid feeling anxious or stressed.Stress increases muscle tension and can worsen back pain.It is important to recognize when you are anxious or stressed and learn ways to manage it, such as with exercise.  Take medicines only as directed by your health care provider. Over-the-counter medicines to reduce pain and inflammation are often the most helpful.Your health care provider may prescribe muscle relaxant drugs.These medicines help dull your pain so you can more quickly return to your normal activities and healthy exercise.  Apply ice to the injured area:  Put ice in a plastic bag.  Place a towel between your skin and the bag.  Leave the ice on for 20 minutes, 2-3 times a day for the first 2-3 days. After that, ice and heat may be alternated to reduce pain and spasms.  Maintain a healthy weight. Excess weight puts extra stress on your back and makes it difficult to maintain good posture. SEEK MEDICAL CARE IF:  You have pain that is not relieved with rest or medicine.  You have increasing pain going down into the legs or buttocks.  You have pain that does not improve in one week.  You have night pain.  You lose weight.  You have a fever or chills. SEEK IMMEDIATE MEDICAL CARE IF:   You develop new bowel or bladder control problems.  You  have unusual weakness or numbness in your arms or legs.  You develop nausea or vomiting.  You develop abdominal pain.  You feel faint.   This information is not intended to replace advice given to you by your health care provider. Make sure you discuss any questions you have with your health care provider.   Document Released: 06/14/2005 Document Revised: 07/05/2014 Document Reviewed: 10/16/2013 Elsevier Interactive Patient Education [employer]2016 [employer] Inc[employer]

## 2015-12-24 ENCOUNTER — Encounter (HOSPITAL_COMMUNITY): Payer: Self-pay | Admitting: Emergency Medicine

## 2015-12-24 ENCOUNTER — Emergency Department (HOSPITAL_COMMUNITY)
Admission: EM | Admit: 2015-12-24 | Discharge: 2015-12-24 | Disposition: A | Discharge status: Home / Self Care | Payer: Managed Care, Other (non HMO) | Source: Home / Self Care | Attending: Emergency Medicine | Admitting: Emergency Medicine

## 2015-12-24 ENCOUNTER — Emergency Department (HOSPITAL_COMMUNITY)
Admission: EM | Admit: 2015-12-24 | Discharge: 2015-12-24 | Disposition: A | Discharge status: Home / Self Care | Payer: Managed Care, Other (non HMO) | Attending: Emergency Medicine | Admitting: Emergency Medicine

## 2015-12-24 ENCOUNTER — Emergency Department (HOSPITAL_COMMUNITY): Payer: Managed Care, Other (non HMO)

## 2015-12-24 DIAGNOSIS — W19XXXA Unspecified fall, initial encounter: Secondary | ICD-10-CM

## 2015-12-24 DIAGNOSIS — R531 Weakness: Secondary | ICD-10-CM

## 2015-12-24 DIAGNOSIS — I10 Essential (primary) hypertension: Secondary | ICD-10-CM | POA: Insufficient documentation

## 2015-12-24 DIAGNOSIS — Z79899 Other long term (current) drug therapy: Secondary | ICD-10-CM

## 2015-12-24 DIAGNOSIS — F17210 Nicotine dependence, cigarettes, uncomplicated: Secondary | ICD-10-CM | POA: Insufficient documentation

## 2015-12-24 DIAGNOSIS — M549 Dorsalgia, unspecified: Secondary | ICD-10-CM | POA: Insufficient documentation

## 2015-12-24 DIAGNOSIS — Y939 Activity, unspecified: Secondary | ICD-10-CM | POA: Insufficient documentation

## 2015-12-24 DIAGNOSIS — F1190 Opioid use, unspecified, uncomplicated: Secondary | ICD-10-CM | POA: Insufficient documentation

## 2015-12-24 DIAGNOSIS — G8929 Other chronic pain: Secondary | ICD-10-CM | POA: Insufficient documentation

## 2015-12-24 DIAGNOSIS — J45909 Unspecified asthma, uncomplicated: Secondary | ICD-10-CM

## 2015-12-24 DIAGNOSIS — T148 Other injury of unspecified body region: Secondary | ICD-10-CM | POA: Diagnosis not present

## 2015-12-24 DIAGNOSIS — F319 Bipolar disorder, unspecified: Secondary | ICD-10-CM | POA: Diagnosis not present

## 2015-12-24 DIAGNOSIS — Y999 Unspecified external cause status: Secondary | ICD-10-CM | POA: Insufficient documentation

## 2015-12-24 DIAGNOSIS — Y929 Unspecified place or not applicable: Secondary | ICD-10-CM | POA: Diagnosis not present

## 2015-12-24 DIAGNOSIS — T148XXA Other injury of unspecified body region, initial encounter: Secondary | ICD-10-CM

## 2015-12-24 DIAGNOSIS — R51 Headache: Secondary | ICD-10-CM | POA: Diagnosis present

## 2015-12-24 MED ORDER — KETOROLAC TROMETHAMINE 15 MG/ML IJ SOLN
30.0000 mg | Freq: Once | INTRAMUSCULAR | Status: AC
  Administered 2015-12-24: 30 mg via INTRAMUSCULAR
  Filled 2015-12-24: qty 2

## 2015-12-24 MED ORDER — AMMONIA AROMATIC IN INHA
RESPIRATORY_TRACT | Status: AC
  Administered 2015-12-24: 12:00:00
  Filled 2015-12-24: qty 30

## 2015-12-24 MED ORDER — ONDANSETRON 4 MG PO TBDP
4.0000 mg | ORAL_TABLET | Freq: Once | ORAL | Status: AC
  Administered 2015-12-24: 4 mg via ORAL
  Filled 2015-12-24: qty 1

## 2015-12-24 NOTE — Discharge Instructions (Signed)

## 2015-12-24 NOTE — ED Notes (Addendum)
Per EMS, pt c/o back pain. Pt seen yesterday in ED for the same. Pt called EMS 2 separate times today, first time did not want to be brought to ED. Pt appears disoriented. EMS reports that police was reporting patient for drug paraphernalia charges when they arrived.
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
## 2015-12-24 NOTE — ED Notes (Signed)
Pt c/o back pain, seen multiple times for same, seen today and cleared for same.

## 2015-12-24 NOTE — ED Notes (Signed)
Patient crawled over side rail of stretcher and fell to floor. She had become agitated because she did not have her cell phone. Patient reached for bedside table and fell slowly striking her left hip/buttock/thigh. Patient asked to stay in floor but denied injury or new pain. Patient assisted back to stretcher and given cell phone. Safety huddle performed, safety zone portal entered. Dr. Rubin PayorPickering notified.

## 2015-12-24 NOTE — Discharge Instructions (Signed)
[name] - as we discussed, please see your doctor tomorrow as planned.  Please consider alternative ways of getting your pain in control besides pain meds.  See your doctor tomorrow. I wish you the very best!!

## 2015-12-24 NOTE — ED Notes (Signed)
Observed pt walking without difficulty through ED doors toward registration.

## 2015-12-24 NOTE — ED Notes (Signed)
Patient not cooperating lying in floor

## 2015-12-24 NOTE — ED Notes (Signed)
Bed: [facility] Expected date:  Expected time:  Means of arrival:  Comments: 

## 2015-12-24 NOTE — ED Notes (Signed)
Went over discharge teaching with patient. She verbalized that she has an appointment [facility] tomorrow. Pt requested to be discharged to lobby. She has called a ride.

## 2015-12-24 NOTE — ED Provider Notes (Signed)
CSN: [id number]     Arrival date & time 12/24/15  1500 History   First MD Initiated Contact with Patient 12/24/15 1501     Chief Complaint  Patient presents with  . Back Pain     (Consider location/radiation/quality/duration/timing/severity/associated sxs/prior Treatment) HPI Comments: Pt comes in with cc of fall.  Pt was discharged from the ER. She reports that she fell down post discharge. She is unsure where she fell or how she fell. ER staff noted that pt did walk out of the ER, came back in the ER, and then just fell in front of registration - and to them it almost looked like an intentional fall. Pt has no new pain anywhere besides headache. She has no bleeding.  Patient is a 45 y.o. female presenting with back pain. The history is provided by the patient.  Back Pain Associated symptoms: headaches     Past Medical History  Diagnosis Date  . Back pain   . Asthma     uses Combivent daily as needed  . Insomnia     takes Trazodone nightly  . Anxiety     takes Klonopin daily as needed  . PONV (postoperative nausea and vomiting)   . Cough   . Spinal headache   . Weakness     left leg  . GERD (gastroesophageal reflux disease)     takes Omeprazole daily  . Urinary urgency   . Bipolar 1 disorder (HCC)     takes Lamictal daily  . Chronic low back pain     HNP and Radiculopathy  . MRSA (methicillin resistant Staphylococcus aureus)     Postive nasal swab  . Hypertension   . Collapsed lung 3/15    "while in hospital awaiting back OR"  . CAP (community acquired pneumonia) 01/07/2015   Past Surgical History  Procedure Laterality Date  . Percutaneous pinning Right 1992    "hand"  . Anterior cervical decomp/discectomy fusion  1996    "took bone off her hip"  . Elbow fracture surgery Left 1975  . Elbow fracture surgery Left 2002    "redo"  . Lumbar disc surgery  2008 X 2  . Posterior lumbar fusion  2008  . Hardware removal  2008    "took screw out of lower back"  .  Epidural block injection    . Lumbar laminectomy/decompression microdiscectomy Left 07/13/2013    Procedure: Left Lumbar four-five microdiskectomy;  Surgeon: Carmela HurtKyle L Cabbell, MD;  Location: [facility];  Service: Neurosurgery;  Laterality: Left;  Left Lumbar four-five microdiskectomy  . Lumbar wound debridement N/A 09/21/2013    Procedure: LUMBAR WOUND re-exploration and disectomy;  Surgeon: Carmela HurtKyle L Cabbell, MD;  Location: [facility];  Service: Neurosurgery;  Laterality: N/A;  . Tonsillectomy    . Fracture surgery    . Back surgery     Family History  Problem Relation Age of Onset  . Multiple sclerosis Mother   . COPD Father   . Alcohol abuse Maternal Grandfather   . Alcohol abuse Brother   . Cirrhosis Brother    Social History  Substance Use Topics  . Smoking status: Current Every Day Smoker -- 1.00 packs/day for 18 years    Types: Cigarettes  . Smokeless tobacco: Never Used  . Alcohol Use: No   OB History    No data available     Review of Systems  Constitutional: Negative for activity change.  Musculoskeletal: Positive for back pain.  Skin: Negative for wound.  Neurological: Positive for headaches.      Allergies  Minocycline and Tramadol  Home Medications   Prior to Admission medications   Medication Sig Start Date End Date Taking? Authorizing Provider  clonazePAM (KLONOPIN) 1 MG tablet Take 1 mg by mouth 3 (three) times daily as needed for anxiety.  12/04/15   Historical Provider, MD  dicyclomine (BENTYL) 20 MG tablet Take 1 tablet (20 mg total) by mouth every 6 (six) hours as needed for spasms (abdominal cramping). Patient not taking: Reported on 12/23/2015 09/11/15   Adonis Brook, NP  hydrOXYzine (ATARAX/VISTARIL) 25 MG tablet Take 1 tablet (25 mg total) by mouth every 6 (six) hours as needed for anxiety. Patient not taking: Reported on 12/23/2015 09/11/15   Adonis Brook, NP  methocarbamol (ROBAXIN) 500 MG tablet Take 1 tablet (500 mg total) by mouth 2 (two) times  daily. 12/23/15   Derwood Kaplan, MD  naproxen (NAPROSYN) 500 MG tablet Take 1 tablet (500 mg total) by mouth 2 (two) times daily as needed for moderate pain (spasm). 12/23/15   Derwood Kaplan, MD  naproxen sodium (ALEVE) 220 MG tablet Take 440 mg by mouth every 8 (eight) hours as needed (For pain.).    Historical Provider, MD  nicotine (NICODERM CQ - DOSED IN MG/24 HOURS) 21 mg/24hr patch Place 1 patch (21 mg total) onto the skin daily. Patient not taking: Reported on 12/23/2015 09/11/15   Adonis Brook, NP  pregabalin (LYRICA) 300 MG capsule Take 1 capsule (300 mg total) by mouth 2 (two) times daily. 09/11/15   Adonis Brook, NP  PROAIR RESPICLICK [phone] Base) MCG/ACT AEPB Inhale 1-2 puffs into the lungs every 4 (four) hours as needed (For wheezing or shortness of breath.).  11/15/15   Historical Provider, MD  QUEtiapine (SEROQUEL) 200 MG tablet Take 1 tablet (200 mg total) by mouth at bedtime. 09/11/15   Adonis Brook, NP   BP 111/66 mmHg  Pulse 73  Temp(Src) 98.6 F (37 C) (Oral)  Resp 14  SpO2 94%  LMP 12/02/2015 Physical Exam  Constitutional: She is oriented to person, place, and time. She appears well-developed.  HENT:  Head: Normocephalic and atraumatic.  Eyes: EOM are normal.  Neck: Normal range of motion. Neck supple.  Cardiovascular: Normal rate.   Pulmonary/Chest: Effort normal.  Abdominal: Bowel sounds are normal.  Musculoskeletal:  Large hematoma to the head  Neurological: She is alert and oriented to person, place, and time.  Skin: Skin is warm and dry.  Nursing note and vitals reviewed.   ED Course  Procedures (including critical care time) Labs Review Labs Reviewed - No data to display  Imaging Review Dg Thoracic Spine 2 View  12/23/2015  CLINICAL DATA:  Chronic mid low back pain.  Multiple surgeries. EXAM: THORACIC SPINE 2 VIEWS COMPARISON:  Chest x-ray 12/11/2015 FINDINGS: Mild compression fracture through the superior endplate of L1, which appears stable since  prior study. No acute fracture. No malalignment. IMPRESSION: Mild compression fracture through the superior endplate of L1, stable since prior chest x-ray. Electronically Signed   By: Charlett Nose M.D.   On: 12/23/2015 12:21   Dg Lumbar Spine Complete  12/23/2015  CLINICAL DATA:  Chronic mid low back pain.  No known injury. EXAM: LUMBAR SPINE - COMPLETE 4+ VIEW COMPARISON:  07/25/2014 FINDINGS: Mild stable compression fracture through the superior endplate of L1. Postoperative changes from posterior fusion at L5-S1. Degenerative disc disease at L4-5. No acute fracture or subluxation. IMPRESSION: Mild chronic L1 compression fracture. Postoperative changes  in the lower lumbar spine. No acute findings. Electronically Signed   By: Charlett NoseKevin  Dover M.D.   On: 12/23/2015 12:21   Ct Head Wo Contrast  12/24/2015  CLINICAL DATA:  Fall, history hypertension, smoking, bipolar disorder, EXAM: CT HEAD WITHOUT CONTRAST TECHNIQUE: Contiguous axial images were obtained from the base of the skull through the vertex without intravenous contrast. COMPARISON:  12/11/2015 FINDINGS: Normal ventricular morphology. No midline shift or mass effect. Beam hardening artifacts from earrings at skullbase. Otherwise normal appearance of brain parenchyma. No intracranial hemorrhage, mass lesion, evidence acute infarction or extra-axial fluid collection. Visualized paranasal sinuses mastoid air cells clear. Skull intact. IMPRESSION: No acute intracranial abnormalities. Electronically Signed   By: Ulyses SouthwardMark  Boles M.D.   On: 12/24/2015 16:33   I have personally reviewed and evaluated these images and lab results as part of my medical decision-making.   EKG Interpretation None      MDM   Final diagnoses:  Hematoma  Fall, initial encounter    Fall - pt not sure why she fell, but she fell in the registration area and it was witnessed by the staff. Pt doesn't recall any details around the fall -could be concussion. CT head is neg, ordered  for large hematoma.  Pt advised to see her doctor tomorrow as planned.     Derwood KaplanAnkit Camara Renstrom, MD 12/24/15 [phone]

## 2015-12-24 NOTE — ED Notes (Signed)
Bed: [facility] Expected date:  Expected time:  Means of arrival:  Comments: Cyndy FreezeRachel Crounse
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
## 2015-12-24 NOTE — ED Notes (Signed)
Discharge instructions and follow up care reviewed with patient. Patient verbalized understanding. 

## 2015-12-25 ENCOUNTER — Encounter: Payer: Self-pay | Admitting: Neurology

## 2015-12-25 ENCOUNTER — Ambulatory Visit (INDEPENDENT_AMBULATORY_CARE_PROVIDER_SITE_OTHER): Payer: Managed Care, Other (non HMO) | Admitting: Neurology

## 2015-12-25 VITALS — BP 91/61 | HR 80 | Ht 62.0 in | Wt 108.0 lb

## 2015-12-25 DIAGNOSIS — R269 Unspecified abnormalities of gait and mobility: Secondary | ICD-10-CM | POA: Diagnosis not present

## 2015-12-25 DIAGNOSIS — M545 Low back pain, unspecified: Secondary | ICD-10-CM

## 2015-12-25 DIAGNOSIS — G8929 Other chronic pain: Secondary | ICD-10-CM

## 2015-12-25 MED ORDER — NORTRIPTYLINE HCL 25 MG PO CAPS: ORAL_CAPSULE | ORAL | Status: AC

## 2015-12-25 NOTE — Progress Notes (Signed)
Reason for visit: Chronic low back pain  Referring physician: Dr. Bevely Palmer is a 45 y.o. female  History of present illness:  [name] is a 45 year old right-handed white female with a history of chronic low back pain. The patient is had multiple surgeries on her low back on her cervical spine. 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 The patient has a limping gait on the left leg. She has ongoing severe pain, she claims that she is not sleeping well at night because of this. She has been falling on occasion. She has had multiple visits to the emergency room for the low back issues, she has been seen on June 15, 27th of June, and June 28. She is on Lyrica, methocarbamol, clonazepam, and she had been getting some hydrocodone through her primary care physician. She has been followed by Dr. Franky Macho, but she was discharged from the practice for multiple no shows. The patient claims that she has been seen through a pain center previously, but she did not continue the visits because she did not feel like waiting long periods of time in the waiting room. The patient has a history of addiction to opiate medications. She claims that she does not do any other illicit drugs, but a urine drug screen on June 15 showed amphetamines. The patient indicates that she does not know how this substance got in her urine. She denies any issues controlling the bowels or the bladder. She claims that she might black out at times secondary to severe pain. She is sent to this office for an evaluation.  Past Medical History  Diagnosis Date  . Back pain   . Asthma     uses Combivent daily as needed  . Insomnia     takes Trazodone nightly  . Anxiety     takes Klonopin daily as needed  . PONV (postoperative nausea and vomiting)   . Cough   . Spinal headache   .  Weakness     left leg  . GERD (gastroesophageal reflux disease)     takes Omeprazole daily  . Urinary urgency   . Bipolar 1 disorder (HCC)     takes Lamictal daily  . Chronic low back pain     HNP and Radiculopathy  . MRSA (methicillin resistant Staphylococcus aureus)     Postive nasal swab  . Hypertension   . Collapsed lung 3/15    "while in hospital awaiting back OR"  . CAP (community acquired pneumonia) 01/07/2015    Past Surgical History  Procedure Laterality Date  . Percutaneous pinning Right 1992    "hand"  . Anterior cervical decomp/discectomy fusion  1996    "took bone off her hip"  . Elbow fracture surgery Left 1975  . Elbow fracture surgery Left 2002    "redo"  . Lumbar disc surgery  2008 X 2  . Posterior lumbar fusion  2008  . Hardware removal  2008    "took screw out of lower back"  . Epidural block injection    . Lumbar laminectomy/decompression microdiscectomy Left 07/13/2013    Procedure: Left Lumbar four-five microdiskectomy;  Surgeon: Carmela Hurt, MD;  Location: [facility];  Service: Neurosurgery;  Laterality: Left;  Left Lumbar four-five microdiskectomy  . Lumbar wound debridement N/A 09/21/2013    Procedure: LUMBAR WOUND re-exploration  and disectomy;  Surgeon: Carmela HurtKyle L Cabbell, MD;  Location: [facility];  Service: Neurosurgery;  Laterality: N/A;  . Tonsillectomy    . Fracture surgery    . Back surgery      Family History  Problem Relation Age of Onset  . Multiple sclerosis Mother   . COPD Father   . Alcohol abuse Maternal Grandfather   . Alcohol abuse Brother   . Cirrhosis Brother     Social history:  reports that she has been smoking Cigarettes.  She has a 18 pack-year smoking history. She has never used smokeless tobacco. She reports that she uses illicit drugs (Heroin). She reports that she does not drink alcohol.  Medications:  Prior to Admission medications   Medication Sig Start Date End Date Taking? Authorizing Provider  clonazePAM  (KLONOPIN) 1 MG tablet Take 1 mg by mouth 3 (three) times daily as needed for anxiety.  12/04/15  Yes Historical Provider, MD  LORazepam (ATIVAN) 0.5 MG tablet TK 1 T PO  BID PRF ANXIETY 11/11/15  Yes Historical Provider, MD  methocarbamol (ROBAXIN) 500 MG tablet Take 1 tablet (500 mg total) by mouth 2 (two) times daily. 12/23/15  Yes Derwood KaplanAnkit Nanavati, MD  naproxen (NAPROSYN) 500 MG tablet Take 1 tablet (500 mg total) by mouth 2 (two) times daily as needed for moderate pain (spasm). 12/23/15  Yes Derwood KaplanAnkit Nanavati, MD  pregabalin (LYRICA) 300 MG capsule Take 1 capsule (300 mg total) by mouth 2 (two) times daily. 09/11/15  Yes Adonis BrookSheila Agustin, NP  PROAIR RESPICLICK [phone](90 Base) MCG/ACT AEPB Inhale 1-2 puffs into the lungs every 4 (four) hours as needed (For wheezing or shortness of breath.).  11/15/15  Yes Historical Provider, MD  QUEtiapine (SEROQUEL) 200 MG tablet Take 1 tablet (200 mg total) by mouth at bedtime. 09/11/15  Yes Adonis BrookSheila Agustin, NP  HYDROcodone-acetaminophen (NORCO/VICODIN) 5-325 MG tablet Reported on 12/25/2015 12/04/15   Historical Provider, MD  ondansetron (ZOFRAN) 8 MG tablet Reported on 12/25/2015 10/30/15   Historical Provider, MD      Allergies  Allergen Reactions  . Minocycline Nausea And Vomiting  . Tramadol Nausea And Vomiting    ROS:  Out of a complete 14 system review of symptoms, the patient complains only of the following symptoms, and all other reviewed systems are negative.  Fatigue Wheezing Joint pain, achy muscles Numbness, weakness, dizziness, passing out Depression, anxiety, not enough sleep, decreased energy, change in appetite, disinterest in activities Insomnia, restless legs  Blood pressure 91/61, pulse 80, height 5\' 2"  (1.575 m), weight 108 lb (48.988 kg), last menstrual period 12/02/2015.  Physical Exam  General: The patient is alert and cooperative at the time of the examination.  Eyes: Pupils are equal, round, and reactive to light. Discs are flat  bilaterally.  Neck: The neck is supple, no carotid bruits are noted.  Respiratory: The respiratory examination is clear.  Cardiovascular: The cardiovascular examination reveals a regular rate and rhythm, no obvious murmurs or rubs are noted.  Skin: Extremities are without significant edema.  Neurologic Exam  Mental status: The patient is alert and oriented x 3 at the time of the examination. The patient has apparent normal recent and remote memory, with an apparently normal attention span and concentration ability.  Cranial nerves: Facial symmetry is present. There is good sensation of the face to pinprick and soft touch bilaterally. The strength of the facial muscles and the muscles to head turning and shoulder shrug are normal bilaterally. Speech is well enunciated,  no aphasia or dysarthria is noted. Extraocular movements are full. Visual fields are full. The tongue is midline, and the patient has symmetric elevation of the soft palate. No obvious hearing deficits are noted.  Motor: The motor testing reveals 5 over 5 strength of all 4 extremities, with exception of some giveaway type weakness of both lower extremities, left greater than right. Good symmetric motor tone is noted throughout.  Sensory: Sensory testing is intact to pinprick, soft touch, vibration sensation, and position sense on all 4 extremities, with exception of some decrease in pinprick, soft touch, vibration and position sense on the left foot and leg. No evidence of extinction is noted.  Coordination: Cerebellar testing reveals good finger-nose-finger and heel-to-shin bilaterally.  Gait and station: Gait is associated with a limping type gait on the left leg. Tandem gait is unsteady. Romberg is negative. The patient is not able to walk on heels and the toes on either side.  Reflexes: Deep tendon reflexes are symmetric and normal bilaterally, with exception of some depression of the left ankle jerk reflex. Toes are  downgoing bilaterally.   Assessment/Plan:  1. Chronic low back pain, "failed back syndrome"  2. History of opiate addiction  The patient is having ongoing significant low back pain. She is quite focused today upon getting a prescription for hydrocodone, she claims that her primary care physician is no longer giving her any of this medication. Given her history, and recent urine drug screen with amphetamines present, I think that giving this patient any controlled substance is not wise. The patient will be given nortriptyline 25 mg to take one night for 2 weeks, then take 50 mg at night. The patient will be set up for physical therapy for neuromuscular therapy. [facility] a spinal stimulator. She is unable to return to her neurosurgeon as she has been discharged from that practice. She will follow-up here in 4 months. The patient was quite upset today when the prescription for hydrocodone was refused.  Marlan Palau. Keith Willis MD 12/25/2015 7:39 PM  [facility] [address] [place], [place] [id number]  Phone [phone] Fax [phone]

## 2015-12-25 NOTE — Patient Instructions (Signed)

## 2016-01-31 ENCOUNTER — Emergency Department (HOSPITAL_COMMUNITY)
Admission: EM | Admit: 2016-01-31 | Discharge: 2016-02-01 | Disposition: A | Discharge status: Home / Self Care | Payer: Managed Care, Other (non HMO) | Attending: Emergency Medicine | Admitting: Emergency Medicine

## 2016-01-31 ENCOUNTER — Emergency Department (HOSPITAL_COMMUNITY): Payer: Managed Care, Other (non HMO)

## 2016-01-31 ENCOUNTER — Encounter (HOSPITAL_COMMUNITY): Payer: Self-pay

## 2016-01-31 DIAGNOSIS — Y999 Unspecified external cause status: Secondary | ICD-10-CM | POA: Diagnosis not present

## 2016-01-31 DIAGNOSIS — Z79899 Other long term (current) drug therapy: Secondary | ICD-10-CM | POA: Insufficient documentation

## 2016-01-31 DIAGNOSIS — T148XXA Other injury of unspecified body region, initial encounter: Secondary | ICD-10-CM

## 2016-01-31 DIAGNOSIS — I10 Essential (primary) hypertension: Secondary | ICD-10-CM | POA: Insufficient documentation

## 2016-01-31 DIAGNOSIS — Y92019 Unspecified place in single-family (private) house as the place of occurrence of the external cause: Secondary | ICD-10-CM | POA: Diagnosis not present

## 2016-01-31 DIAGNOSIS — S0083XA Contusion of other part of head, initial encounter: Secondary | ICD-10-CM | POA: Insufficient documentation

## 2016-01-31 DIAGNOSIS — R51 Headache: Secondary | ICD-10-CM | POA: Diagnosis present

## 2016-01-31 DIAGNOSIS — F17210 Nicotine dependence, cigarettes, uncomplicated: Secondary | ICD-10-CM | POA: Insufficient documentation

## 2016-01-31 DIAGNOSIS — Y939 Activity, unspecified: Secondary | ICD-10-CM | POA: Insufficient documentation
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
## 2016-03-15 ENCOUNTER — Encounter (HOSPITAL_COMMUNITY): Payer: Self-pay | Admitting: Emergency Medicine

## 2016-03-15 ENCOUNTER — Emergency Department (HOSPITAL_COMMUNITY)
Admission: EM | Admit: 2016-03-15 | Discharge: 2016-03-15 | Disposition: A | Discharge status: Home / Self Care | Payer: Managed Care, Other (non HMO) | Attending: Emergency Medicine | Admitting: Emergency Medicine

## 2016-03-15 ENCOUNTER — Emergency Department (HOSPITAL_COMMUNITY): Payer: Managed Care, Other (non HMO)

## 2016-03-15 DIAGNOSIS — Z79899 Other long term (current) drug therapy: Secondary | ICD-10-CM | POA: Diagnosis not present

## 2016-03-15 DIAGNOSIS — I10 Essential (primary) hypertension: Secondary | ICD-10-CM | POA: Insufficient documentation

## 2016-03-15 DIAGNOSIS — R0789 Other chest pain: Secondary | ICD-10-CM | POA: Diagnosis not present

## 2016-03-15 DIAGNOSIS — J45909 Unspecified asthma, uncomplicated: Secondary | ICD-10-CM | POA: Diagnosis not present

## 2016-03-15 DIAGNOSIS — F17210 Nicotine dependence, cigarettes, uncomplicated: Secondary | ICD-10-CM | POA: Insufficient documentation

## 2016-03-15 DIAGNOSIS — R0602 Shortness of breath: Secondary | ICD-10-CM | POA: Insufficient documentation

## 2016-03-15 HISTORY — DX: Other psychoactive substance abuse, uncomplicated: F19.10

## 2016-03-15 LAB — CBC WITH DIFFERENTIAL/PLATELET
Basophils Absolute: 0 10*3/uL (ref 0.0–0.1)
Basophils Relative: 0 %
EOS ABS: 0 10*3/uL (ref 0.0–0.7)
Eosinophils Relative: 0 %
HCT: 42.6 % (ref 36.0–46.0)
HEMOGLOBIN: 14.2 g/dL (ref 12.0–15.0)
LYMPHS ABS: 2.8 10*3/uL (ref 0.7–4.0)
Lymphocytes Relative: 34 %
MCH: 31.7 pg (ref 26.0–34.0)
MCHC: 33.3 g/dL (ref 30.0–36.0)
MCV: 95.1 fL (ref 78.0–100.0)
Monocytes Absolute: 0.4 10*3/uL (ref 0.1–1.0)
Monocytes Relative: 5 %
NEUTROS PCT: 61 %
Neutro Abs: 5.2 10*3/uL (ref 1.7–7.7)
PLATELETS: 341 10*3/uL (ref 150–400)
RBC: 4.48 MIL/uL (ref 3.87–5.11)
RDW: 14.3 % (ref 11.5–15.5)
WBC: 8.4 10*3/uL (ref 4.0–10.5)

## 2016-03-15 LAB — URINALYSIS, ROUTINE W REFLEX MICROSCOPIC
Bilirubin Urine: NEGATIVE
GLUCOSE, UA: NEGATIVE mg/dL
Hgb urine dipstick: NEGATIVE
Ketones, ur: NEGATIVE mg/dL
LEUKOCYTES UA: NEGATIVE
Nitrite: NEGATIVE
PH: 7.5 (ref 5.0–8.0)
PROTEIN: NEGATIVE mg/dL
Specific Gravity, Urine: 1.005 (ref 1.005–1.030)

## 2016-03-15 LAB — COMPREHENSIVE METABOLIC PANEL
ALT: 9 U/L — ABNORMAL LOW (ref 14–54)
ANION GAP: 10 (ref 5–15)
AST: 18 U/L (ref 15–41)
Albumin: 4.1 g/dL (ref 3.5–5.0)
Alkaline Phosphatase: 81 U/L (ref 38–126)
BUN: 9 mg/dL (ref 6–20)
CHLORIDE: 110 mmol/L (ref 101–111)
CO2: 22 mmol/L (ref 22–32)
Calcium: 9.7 mg/dL (ref 8.9–10.3)
Creatinine, Ser: 0.74 mg/dL (ref 0.44–1.00)
Glucose, Bld: 107 mg/dL — ABNORMAL HIGH (ref 65–99)
POTASSIUM: 3.6 mmol/L (ref 3.5–5.1)
Sodium: 142 mmol/L (ref 135–145)
Total Bilirubin: 0.6 mg/dL (ref 0.3–1.2)
Total Protein: 7 g/dL (ref 6.5–8.1)

## 2016-03-15 LAB — RAPID URINE DRUG SCREEN, HOSP PERFORMED
Amphetamines: NOT DETECTED
BENZODIAZEPINES: NOT DETECTED
Barbiturates: NOT DETECTED
COCAINE: NOT DETECTED
Opiates: NOT DETECTED
Tetrahydrocannabinol: NOT DETECTED

## 2016-03-15 LAB — POC URINE PREG, ED: Preg Test, Ur: NEGATIVE

## 2016-03-15 LAB — I-STAT TROPONIN, ED: TROPONIN I, POC: 0 ng/mL (ref 0.00–0.08)

## 2016-03-15 MED ORDER — HYDROXYZINE HCL 25 MG PO TABS
25.0000 mg | ORAL_TABLET | Freq: Once | ORAL | Status: AC
  Administered 2016-03-15: 25 mg via ORAL
  Filled 2016-03-15: qty 1

## 2016-03-15 MED ORDER — HYDROXYZINE HCL 25 MG PO TABS: 25.0000 mg | ORAL_TABLET | Freq: Three times a day (TID) | ORAL | 0 refills | Status: AC | PRN

## 2016-03-15 NOTE — Discharge Instructions (Signed)
Take the prescribed medication as directed. Follow-up with one of the local clinics so you can establish with a psychologist or a counselor-- I think it may help you to meet with someone regularly to discuss your issues. Return to the ED for new or worsening symptoms-- specifically if you developed any suicidal or homicidal ideation or any hallucinations.

## 2016-03-15 NOTE — ED Notes (Signed)
Provider at bedside

## 2016-03-15 NOTE — ED Triage Notes (Signed)
Pt arrives from home via [facility] c/o CP x 1 week, describes pain as tightness with SOB.  Pt denies cough, fever, chills, LOC.  Pt reports recent changes to meds and recent loss of brother.  Resp e/u.

## 2016-03-15 NOTE — ED Provider Notes (Signed)
[facility]-EMERGENCY DEPT Provider Note   CSN: [id number] Arrival date & time: 03/15/16  1117     History   Chief Complaint Chief Complaint  Patient presents with  . Chest Pain    HPI Rachael Ramirez is a 45 y.o. female.  The history is provided by the patient and medical records.  Chest Pain    45 y.o. F with hx of anxiety, asthma, chronic back pain, bipolar disorder, GERD, MRSA, presenting to the ED for Chest tightness and shortness of breath. She reports this is been ongoing for the past 3 weeks. States onset coincided with the loss of her brother. She states she and her brother were very close for most of her life. She states they actually live together and saw each other most days. She states his body was discovered across the street from their home, cause of death was undetermined. She does report he has a history of IV drug use. Patient states when she feels the chest tightness is localized to her midsternal region and feels as though her chest is "constricted". She denies any cough, nasal congestion, sore throat, fever, or other upper respiratory symptoms. She has no known cardiac history. States doctors believe her father had a heart attack but she is not entirely sure of this. She reports she is a daily smoker and has cut back since her symptoms began. Patient states she is concerned that this may be due to component of her anxiety. She reports she recently underwent rehabilitation for heroin use. She states she was snorting heroin, never injecting. She also reports occasional marijuana use. States since rehabilitation she has not been using any illicit drugs or alcohol. She states her doctor has taken her off of all of her benzos. States she feels that this may be causing her symptoms. She has not had any abdominal pain, nausea, vomiting, diarrhea, tremors, or seizures. She does report lack of sleep and decreased appetite since the loss of her brother. She states she does not necessarily feel  depressed, but feels she cannot get her mind to "relax". She denies any suicidal or homicidal ideation. No auditory or visual hallucinations.  Of note, patient also reports her husband of 24 years just left her a few weeks ago as well for unknown reasons.    Past Medical History:  Diagnosis Date  . Anxiety    takes Klonopin daily as needed  . Asthma    uses Combivent daily as needed  . Back pain   . Bipolar 1 disorder (HCC)    takes Lamictal daily  . CAP (community acquired pneumonia) 01/07/2015  . Chronic low back pain    HNP and Radiculopathy  . Collapsed lung 3/15   "while in hospital awaiting back OR"  . Cough   . GERD (gastroesophageal reflux disease)    takes Omeprazole daily  . Hypertension   . Insomnia    takes Trazodone nightly  . MRSA (methicillin resistant Staphylococcus aureus)    Postive nasal swab  . PONV (postoperative nausea and vomiting)   . Spinal headache   . Urinary urgency   . Weakness    left leg    Patient Active Problem List   Diagnosis Date Noted  . Severe episode of recurrent major depressive disorder, without psychotic features (HCC)   . Opioid type dependence, continuous (HCC) 07/02/2015  . Generalized anxiety disorder 07/02/2015  . MDD (major depressive disorder), recurrent episode, severe (HCC) 07/01/2015  . Unspecified mood (affective) disorder (HCC) 06/28/2015  .  Altered consciousness   . CAP (community acquired pneumonia) 01/07/2015  . Dehydration, mild 01/07/2015  . Tobacco abuse 01/07/2015  . Acute asthmatic bronchitis 01/07/2015  . Major depressive disorder, recurrent severe without psychotic features (HCC)   . Overdose   . Gait difficulty 02/11/2014  . Unresponsive episode 02/08/2014  . Acute back pain 09/23/2013  . Protein-calorie malnutrition, severe (HCC) 09/18/2013  . Acute respiratory failure with hypoxia (HCC) 09/18/2013  . Drug overdose 09/14/2013  . History of suicide attempt 09/14/2013  . Anxiety 09/14/2013  .  Compression fracture of L1 lumbar vertebra with delayed healing 09/14/2013  . Lumbar herniated disc 09/14/2013  . Abnormal EKG 09/14/2013  . Suicide attempt by drug ingestion (HCC) 09/14/2013  . Chronic low back pain 08/22/2013  . HNP (herniated nucleus pulposus), lumbar 07/13/2013  . Bipolar disorder (HCC) 09/29/2012    Past Surgical History:  Procedure Laterality Date  . ANTERIOR CERVICAL DECOMP/DISCECTOMY FUSION  1996   "took bone off her hip"  . BACK SURGERY    . ELBOW FRACTURE SURGERY Left 1975  . ELBOW FRACTURE SURGERY Left 2002   "redo"  . EPIDURAL BLOCK INJECTION    . FRACTURE SURGERY    . HARDWARE REMOVAL  2008   "took screw out of lower back"  . LUMBAR DISC SURGERY  2008 X 2  . LUMBAR LAMINECTOMY/DECOMPRESSION MICRODISCECTOMY Left 07/13/2013   Procedure: Left Lumbar four-five microdiskectomy;  Surgeon: Carmela Hurt, MD;  Location: [facility];  Service: Neurosurgery;  Laterality: Left;  Left Lumbar four-five microdiskectomy  . LUMBAR WOUND DEBRIDEMENT N/A 09/21/2013   Procedure: LUMBAR WOUND re-exploration and disectomy;  Surgeon: Carmela Hurt, MD;  Location: [facility];  Service: Neurosurgery;  Laterality: N/A;  . PERCUTANEOUS PINNING Right 1992   "hand"  . POSTERIOR LUMBAR FUSION  2008  . TONSILLECTOMY      OB History    No data available       Home Medications    Prior to Admission medications   Medication Sig Start Date End Date Taking? Authorizing Provider  clonazePAM (KLONOPIN) 1 MG tablet Take 1 mg by mouth 3 (three) times daily as needed for anxiety.  12/04/15   Historical Provider, MD  methocarbamol (ROBAXIN) 500 MG tablet Take 1 tablet (500 mg total) by mouth 2 (two) times daily. 12/23/15   Derwood Kaplan, MD  naproxen (NAPROSYN) 500 MG tablet Take 1 tablet (500 mg total) by mouth 2 (two) times daily as needed for moderate pain (spasm). 12/23/15   Derwood Kaplan, MD  nortriptyline (PAMELOR) 25 MG capsule One capsule at night for 2 weeks, then take 2  capsules at night 12/25/15   York Spaniel, MD  ondansetron [facility]) 8 MG tablet Reported on 12/25/2015 10/30/15   Historical Provider, MD  pregabalin (LYRICA) 300 MG capsule Take 1 capsule (300 mg total) by mouth 2 (two) times daily. 09/11/15   Adonis Brook, NP  PROAIR RESPICLICK 108 [phone] Base) MCG/ACT AEPB Inhale 1-2 puffs into the lungs every 4 (four) hours as needed (For wheezing or shortness of breath.).  11/15/15   Historical Provider, MD  QUEtiapine (SEROQUEL) 200 MG tablet Take 1 tablet (200 mg total) by mouth at bedtime. 09/11/15   Adonis Brook, NP    Family History Family History  Problem Relation Age of Onset  . Multiple sclerosis Mother   . COPD Father   . Alcohol abuse Maternal Grandfather   . Alcohol abuse Brother   . Cirrhosis Brother  Social History Social History  Substance Use Topics  . Smoking status: Current Every Day Smoker    Packs/day: 1.00    Years: 18.00    Types: Cigarettes  . Smokeless tobacco: Never Used  . Alcohol use No     Allergies   Minocycline and Tramadol   Review of Systems Review of Systems  Cardiovascular: Positive for chest pain.  All other systems reviewed and are negative.    Physical Exam Updated Vital Signs BP 146/97 (BP Location: Right Arm)   Pulse 70   Temp 99.4 F (37.4 C) (Oral) Comment: Simultaneous filing. User may not have seen previous data. Comment (Src): Simultaneous filing. User may not have seen previous data.  Resp 18   Ht 5\' 2"  (1.575 m)   Wt 47.6 kg   LMP 02/13/2016   SpO2 99%   BMI 19.20 kg/m   Physical Exam  Constitutional: She is oriented to person, place, and time. She appears well-developed and well-nourished.  HENT:  Head: Normocephalic and atraumatic.  Mouth/Throat: Oropharynx is clear and moist.  Eyes: Conjunctivae and EOM are normal. Pupils are equal, round, and reactive to light.  Neck: Normal range of motion.  Cardiovascular: Normal rate, regular rhythm and normal heart sounds.   No  rubs or murmurs  Pulmonary/Chest: Effort normal and breath sounds normal.  Abdominal: Soft. Bowel sounds are normal.  Musculoskeletal: Normal range of motion.  Neurological: She is alert and oriented to person, place, and time.  Skin: Skin is warm and dry.  Psychiatric: Her mood appears anxious. She is not actively hallucinating. She expresses no homicidal and no suicidal ideation. She expresses no suicidal plans and no homicidal plans.  Anxious appearing, intermittently tearful during exam Denies SI/HI/AVH  Nursing note and vitals reviewed.    ED Treatments / Results  Labs (all labs ordered are listed, but only abnormal results are displayed) Labs Reviewed  COMPREHENSIVE METABOLIC PANEL - Abnormal; Notable for the following:       Result Value   Glucose, Bld 107 (*)    ALT 9 (*)    All other components within normal limits  CBC WITH DIFFERENTIAL/PLATELET  URINE RAPID DRUG SCREEN, HOSP PERFORMED  URINALYSIS, ROUTINE W REFLEX MICROSCOPIC (NOT AT [facility])  HIV ANTIBODY (ROUTINE TESTING)  HEPATITIS PANEL, ACUTE  I-STAT TROPOININ, ED  POC URINE PREG, ED    EKG  EKG Interpretation  Date/Time:  Monday March 15 2016 11:21:28 EDT Ventricular Rate:  79 PR Interval:    QRS Duration: 82 QT Interval:  402 QTC Calculation: 461 R Axis:   62 Text Interpretation:  Sinus rhythm Borderline short PR interval Probable anterior infarct, old Abnormal ekg Confirmed by Gerhard Munch  MD [phone]) on 03/15/2016 11:33:39 AM Also confirmed by Gerhard Munch  MD ([id number]), editor [place], Cala Bradford [phone])  on 03/15/2016 12:32:20 PM       Radiology Dg Chest 2 View  Result Date: 03/15/2016 CLINICAL DATA:  Chest tightness and shortness of breath. EXAM: CHEST  2 VIEW COMPARISON:  12/11/2015 chest radiograph. FINDINGS: Stable cardiomediastinal silhouette with normal heart size. No pneumothorax. No pleural effusion. Lungs appear clear, with no acute consolidative airspace disease and no pulmonary edema.  IMPRESSION: No active cardiopulmonary disease. Electronically Signed   By: Delbert Phenix M.D.   On: 03/15/2016 12:27    Procedures Procedures (including critical care time)  Medications Ordered in ED Medications  hydrOXYzine (ATARAX/VISTARIL) tablet 25 mg (25 mg Oral Given 03/15/16 1325)     Initial Impression / Assessment  and Plan / ED Course  I have reviewed the triage vital signs and the nursing notes.  Pertinent labs & imaging results that were available during my care of the patient were reviewed by me and considered in my medical decision making (see chart for details).  Clinical Course   45 year old female here with chest tightness and shortness of breath. Been ongoing for the past 3 weeks coinciding with the death of her brother. She does report history of anxiety. Her EKG is nonischemic. Labwork is reassuring. HIV and hepatitis panel sent at patient's request (reports interaction with IVDU recently and wanted to be tested) and are pending at this time.  CXR is clear.  Patient was anxious and intermittently tearful during exam, this can be expected given recent events. Her drug screen is negative. She was given a dose of Vistaril here which did seem to help calm her nerves and anxiety.  With negative work-up after 3 weeks of ongoing symptoms, feel ACS, PE, dissection, or other acute cardiac event less likely.  Her symptoms do seem anxiety related.  After talking with patient for brief period, patient states she seemed to feel better about simply being able to express her thoughts and feelings. Continues to deny and SI/HI/AVH and does not appear to be a danger to herself.  She states she has been referred to a psychiatrist in the past, however she was not comfortable with them and felt as if they were just "putting her on meds". I feel that she may benefit from a psychologist and/or counselor to speak with about her issues-- given resource guide for this. Feel comfortable prescribing her a  small supply of Vistaril to help with her symptoms. Patient was advised that she'll be contacted if her HIV or hepatitis results are positive. Otherwise she may follow up with her primary care doctor.  Final Clinical Impressions(s) / ED Diagnoses   Final diagnoses:  Chest tightness  Shortness of breath    New Prescriptions New Prescriptions   HYDROXYZINE (ATARAX/VISTARIL) 25 MG TABLET    Take 1 tablet (25 mg total) by mouth 3 (three) times daily as needed.     Garlon Hatchet, PA-C 03/15/16 1503    Gerhard Munch, MD 03/15/16 [phone]

## 2016-03-16 LAB — HEPATITIS PANEL, ACUTE
HEP A IGM: NEGATIVE
HEP B C IGM: NEGATIVE
Hepatitis B Surface Ag: NEGATIVE

## 2016-03-16 LAB — HIV ANTIBODY (ROUTINE TESTING W REFLEX): HIV SCREEN 4TH GENERATION: NONREACTIVE

## 2016-04-28 ENCOUNTER — Ambulatory Visit: Payer: Managed Care, Other (non HMO) | Admitting: Neurology

## 2016-04-28 ENCOUNTER — Telehealth: Payer: Self-pay

## 2016-04-28 NOTE — Telephone Encounter (Signed)
Pt no-showed her follow-up appt this morning. 

## 2016-05-04 ENCOUNTER — Encounter: Payer: Self-pay | Admitting: Neurology

## 2016-05-11 ENCOUNTER — Telehealth: Payer: Self-pay | Admitting: *Deleted

## 2016-05-11 NOTE — Telephone Encounter (Signed)
[facility] on 05/11/16 to [phone].

## 2017-05-06 ENCOUNTER — Emergency Department (HOSPITAL_COMMUNITY): Payer: Managed Care, Other (non HMO)

## 2017-05-06 ENCOUNTER — Other Ambulatory Visit: Payer: Self-pay

## 2017-05-06 ENCOUNTER — Observation Stay (HOSPITAL_COMMUNITY)
Admission: EM | Admit: 2017-05-06 | Discharge: 2017-05-08 | Disposition: A | Discharge status: Home / Self Care | Payer: Managed Care, Other (non HMO) | Attending: Internal Medicine | Admitting: Internal Medicine

## 2017-05-06 ENCOUNTER — Encounter (HOSPITAL_COMMUNITY): Payer: Self-pay

## 2017-05-06 DIAGNOSIS — K219 Gastro-esophageal reflux disease without esophagitis: Secondary | ICD-10-CM | POA: Diagnosis not present

## 2017-05-06 DIAGNOSIS — Z836 Family history of other diseases of the respiratory system: Secondary | ICD-10-CM

## 2017-05-06 DIAGNOSIS — Z881 Allergy status to other antibiotic agents status: Secondary | ICD-10-CM

## 2017-05-06 DIAGNOSIS — Z820 Family history of epilepsy and other diseases of the nervous system: Secondary | ICD-10-CM

## 2017-05-06 DIAGNOSIS — I10 Essential (primary) hypertension: Secondary | ICD-10-CM | POA: Diagnosis not present

## 2017-05-06 DIAGNOSIS — J40 Bronchitis, not specified as acute or chronic: Secondary | ICD-10-CM | POA: Insufficient documentation

## 2017-05-06 DIAGNOSIS — B9789 Other viral agents as the cause of diseases classified elsewhere: Secondary | ICD-10-CM | POA: Diagnosis not present

## 2017-05-06 DIAGNOSIS — J4551 Severe persistent asthma with (acute) exacerbation: Secondary | ICD-10-CM | POA: Diagnosis not present

## 2017-05-06 DIAGNOSIS — Z811 Family history of alcohol abuse and dependence: Secondary | ICD-10-CM

## 2017-05-06 DIAGNOSIS — F339 Major depressive disorder, recurrent, unspecified: Secondary | ICD-10-CM | POA: Diagnosis not present

## 2017-05-06 DIAGNOSIS — M549 Dorsalgia, unspecified: Secondary | ICD-10-CM

## 2017-05-06 DIAGNOSIS — F419 Anxiety disorder, unspecified: Secondary | ICD-10-CM | POA: Insufficient documentation

## 2017-05-06 DIAGNOSIS — F1111 Opioid abuse, in remission: Secondary | ICD-10-CM | POA: Diagnosis not present

## 2017-05-06 DIAGNOSIS — F332 Major depressive disorder, recurrent severe without psychotic features: Secondary | ICD-10-CM | POA: Diagnosis present

## 2017-05-06 DIAGNOSIS — F319 Bipolar disorder, unspecified: Secondary | ICD-10-CM | POA: Insufficient documentation

## 2017-05-06 DIAGNOSIS — Z79899 Other long term (current) drug therapy: Secondary | ICD-10-CM | POA: Diagnosis not present

## 2017-05-06 DIAGNOSIS — G4700 Insomnia, unspecified: Secondary | ICD-10-CM | POA: Insufficient documentation

## 2017-05-06 DIAGNOSIS — G8929 Other chronic pain: Secondary | ICD-10-CM

## 2017-05-06 DIAGNOSIS — F17210 Nicotine dependence, cigarettes, uncomplicated: Secondary | ICD-10-CM | POA: Insufficient documentation

## 2017-05-06 DIAGNOSIS — E43 Unspecified severe protein-calorie malnutrition: Secondary | ICD-10-CM | POA: Insufficient documentation

## 2017-05-06 DIAGNOSIS — J45901 Unspecified asthma with (acute) exacerbation: Secondary | ICD-10-CM

## 2017-05-06 DIAGNOSIS — E860 Dehydration: Secondary | ICD-10-CM | POA: Diagnosis not present

## 2017-05-06 DIAGNOSIS — R9431 Abnormal electrocardiogram [ECG] [EKG]: Secondary | ICD-10-CM | POA: Diagnosis not present

## 2017-05-06 DIAGNOSIS — R000 Tachycardia, unspecified: Secondary | ICD-10-CM

## 2017-05-06 DIAGNOSIS — M5126 Other intervertebral disc displacement, lumbar region: Secondary | ICD-10-CM | POA: Insufficient documentation

## 2017-05-06 DIAGNOSIS — E872 Acidosis: Secondary | ICD-10-CM

## 2017-05-06 DIAGNOSIS — J9601 Acute respiratory failure with hypoxia: Secondary | ICD-10-CM | POA: Insufficient documentation

## 2017-05-06 DIAGNOSIS — Z91410 Personal history of adult physical and sexual abuse: Secondary | ICD-10-CM | POA: Diagnosis not present

## 2017-05-06 DIAGNOSIS — F1120 Opioid dependence, uncomplicated: Secondary | ICD-10-CM | POA: Diagnosis present

## 2017-05-06 DIAGNOSIS — Z8379 Family history of other diseases of the digestive system: Secondary | ICD-10-CM

## 2017-05-06 DIAGNOSIS — Z888 Allergy status to other drugs, medicaments and biological substances status: Secondary | ICD-10-CM

## 2017-05-06 LAB — CBC WITH DIFFERENTIAL/PLATELET
BASOS ABS: 0 10*3/uL (ref 0.0–0.1)
BASOS PCT: 0 %
EOS ABS: 0.1 10*3/uL (ref 0.0–0.7)
EOS PCT: 2 %
HCT: 38.8 % (ref 36.0–46.0)
Hemoglobin: 12.6 g/dL (ref 12.0–15.0)
Lymphocytes Relative: 11 %
Lymphs Abs: 0.8 10*3/uL (ref 0.7–4.0)
MCH: 30.2 pg (ref 26.0–34.0)
MCHC: 32.5 g/dL (ref 30.0–36.0)
MCV: 93 fL (ref 78.0–100.0)
MONO ABS: 0.6 10*3/uL (ref 0.1–1.0)
Monocytes Relative: 8 %
Neutro Abs: 5.9 10*3/uL (ref 1.7–7.7)
Neutrophils Relative %: 79 %
PLATELETS: 206 10*3/uL (ref 150–400)
RBC: 4.17 MIL/uL (ref 3.87–5.11)
RDW: 14.3 % (ref 11.5–15.5)
WBC: 7.4 10*3/uL (ref 4.0–10.5)

## 2017-05-06 LAB — COMPREHENSIVE METABOLIC PANEL
ALBUMIN: 3.3 g/dL — AB (ref 3.5–5.0)
ALT: 25 U/L (ref 14–54)
ANION GAP: 8 (ref 5–15)
AST: 44 U/L — ABNORMAL HIGH (ref 15–41)
Alkaline Phosphatase: 92 U/L (ref 38–126)
BUN: 7 mg/dL (ref 6–20)
CHLORIDE: 104 mmol/L (ref 101–111)
CO2: 26 mmol/L (ref 22–32)
Calcium: 8.6 mg/dL — ABNORMAL LOW (ref 8.9–10.3)
Creatinine, Ser: 0.77 mg/dL (ref 0.44–1.00)
GFR calc non Af Amer: >60 mL/min (ref >60)
Glucose, Bld: 119 mg/dL — ABNORMAL HIGH (ref 65–99)
Potassium: 3.7 mmol/L (ref 3.5–5.1)
SODIUM: 138 mmol/L (ref 135–145)
Total Bilirubin: 0.9 mg/dL (ref 0.3–1.2)
Total Protein: 6.5 g/dL (ref 6.5–8.1)

## 2017-05-06 LAB — I-STAT ARTERIAL BLOOD GAS, ED
Acid-Base Excess: 2 mmol/L (ref 0.0–2.0)
BICARBONATE: 27.9 mmol/L (ref 20.0–28.0)
O2 SAT: 93 %
PCO2 ART: 50.4 mmHg — AB (ref 32.0–48.0)
PH ART: 7.351 (ref 7.350–7.450)
PO2 ART: 69 mmHg — AB (ref 83.0–108.0)
Patient temperature: 98.5
TCO2: 29 mmol/L (ref 22–32)

## 2017-05-06 LAB — INFLUENZA PANEL BY PCR (TYPE A & B)
Influenza A By PCR: NEGATIVE
Influenza B By PCR: NEGATIVE

## 2017-05-06 LAB — I-STAT CG4 LACTIC ACID, ED: Lactic Acid, Venous: 0.77 mmol/L (ref 0.5–1.9)

## 2017-05-06 MED ORDER — SODIUM CHLORIDE 0.9 % IV BOLUS (SEPSIS)
1000.0000 mL | Freq: Once | INTRAVENOUS | Status: AC
  Administered 2017-05-06: 1000 mL via INTRAVENOUS

## 2017-05-06 MED ORDER — PREDNISONE 20 MG PO TABS
40.0000 mg | ORAL_TABLET | Freq: Every day | ORAL | Status: DC
  Administered 2017-05-07 – 2017-05-08 (×2): 40 mg via ORAL
  Filled 2017-05-06 (×2): qty 2

## 2017-05-06 MED ORDER — IPRATROPIUM-ALBUTEROL 0.5-2.5 (3) MG/3ML IN SOLN
3.0000 mL | Freq: Four times a day (QID) | RESPIRATORY_TRACT | Status: DC
  Administered 2017-05-06 – 2017-05-08 (×6): 3 mL via RESPIRATORY_TRACT
  Filled 2017-05-06 (×7): qty 3

## 2017-05-06 MED ORDER — TRAZODONE HCL 100 MG PO TABS
100.0000 mg | ORAL_TABLET | Freq: Every evening | ORAL | Status: DC | PRN
  Administered 2017-05-07: 100 mg via ORAL
  Filled 2017-05-06: qty 1

## 2017-05-06 MED ORDER — ALBUTEROL (5 MG/ML) CONTINUOUS INHALATION SOLN
10.0000 mg/h | INHALATION_SOLUTION | RESPIRATORY_TRACT | Status: DC
  Administered 2017-05-06: 10 mg/h via RESPIRATORY_TRACT
  Filled 2017-05-06: qty 2

## 2017-05-06 MED ORDER — ALBUTEROL SULFATE (2.5 MG/3ML) 0.083% IN NEBU: 2.5000 mg | INHALATION_SOLUTION | RESPIRATORY_TRACT | Status: DC | PRN

## 2017-05-06 MED ORDER — METHYLPREDNISOLONE SODIUM SUCC 125 MG IJ SOLR
125.0000 mg | Freq: Once | INTRAMUSCULAR | Status: AC
  Administered 2017-05-06: 125 mg via INTRAVENOUS
  Filled 2017-05-06: qty 2

## 2017-05-06 MED ORDER — MAGNESIUM SULFATE 2 GM/50ML IV SOLN
2.0000 g | Freq: Once | INTRAVENOUS | Status: AC
  Administered 2017-05-06: 2 g via INTRAVENOUS
  Filled 2017-05-06: qty 50

## 2017-05-06 MED ORDER — IPRATROPIUM BROMIDE 0.02 % IN SOLN
0.5000 mg | Freq: Once | RESPIRATORY_TRACT | Status: AC
  Administered 2017-05-06: 0.5 mg via RESPIRATORY_TRACT
  Filled 2017-05-06: qty 2.5

## 2017-05-06 MED ORDER — GUAIFENESIN ER 600 MG PO TB12: 1200.0000 mg | ORAL_TABLET | Freq: Two times a day (BID) | ORAL | Status: DC | PRN

## 2017-05-06 MED ORDER — PANTOPRAZOLE SODIUM 40 MG PO TBEC
40.0000 mg | DELAYED_RELEASE_TABLET | Freq: Every day | ORAL | Status: DC
  Administered 2017-05-06 – 2017-05-08 (×3): 40 mg via ORAL
  Filled 2017-05-06 (×3): qty 1

## 2017-05-06 MED ORDER — ENOXAPARIN SODIUM 40 MG/0.4ML ~~LOC~~ SOLN
40.0000 mg | SUBCUTANEOUS | Status: DC
  Administered 2017-05-06 – 2017-05-08 (×3): 40 mg via SUBCUTANEOUS
  Filled 2017-05-06 (×3): qty 0.4

## 2017-05-06 MED ORDER — SODIUM CHLORIDE 0.9 % IV SOLN
1000.0000 mL | INTRAVENOUS | Status: DC
  Administered 2017-05-06 – 2017-05-07 (×2): 1000 mL via INTRAVENOUS

## 2017-05-06 MED ORDER — BUPRENORPHINE HCL 2 MG SL SUBL
4.0000 mg | SUBLINGUAL_TABLET | Freq: Every day | SUBLINGUAL | Status: DC
  Administered 2017-05-07: 4 mg via SUBLINGUAL
  Filled 2017-05-06 (×2): qty 2

## 2017-05-06 MED ORDER — PREGABALIN 100 MG PO CAPS
300.0000 mg | ORAL_CAPSULE | Freq: Two times a day (BID) | ORAL | Status: DC
  Administered 2017-05-06 – 2017-05-08 (×4): 300 mg via ORAL
  Filled 2017-05-06 (×4): qty 3

## 2017-05-06 NOTE — ED Provider Notes (Signed)
[facility] Provider [facility]  CSN: [id number] Arrival date & time: 05/06/17 1249  Chief Complaint(s) Shortness of Breath  HPI Rachael Ramirez is a 46 y.o. female with an extensive past medical history listed below including bipolar, asthma, prior community-acquired pneumonia and pneumothorax who presents to the emergency department with 4 days of productive cough and 2 days of worsening shortness of breath. She does endorse myalgias. She denies any noted fevers, chills, nausea, vomiting, chest pain, abdominal pain, diarrhea, urinary symptoms.  No hemoptysis.  No unilateral leg pain or swelling.  No prior history of DVT.  Patient does have a history of chronic pain and prior substance abuse, she states that she used to use heroin but has not used in a year.  She is currently on Suboxone therapy.  Denies any other recreational drugs.  Denies any other physical complaints.  HPI  Past Medical History Past Medical History:  Diagnosis Date  . Anxiety    takes Klonopin daily as needed  . Asthma    uses Combivent daily as needed  . Back pain   . Bipolar 1 disorder (HCC)    takes Lamictal daily  . CAP (community acquired pneumonia) 01/07/2015  . Chronic low back pain    HNP and Radiculopathy  . Collapsed lung 3/15   "while in hospital awaiting back OR"  . Cough   . GERD (gastroesophageal reflux disease)    takes Omeprazole daily  . Hypertension   . Insomnia    takes Trazodone nightly  . MRSA (methicillin resistant Staphylococcus aureus)    Postive nasal swab  . PONV (postoperative nausea and vomiting)   . Spinal headache   . Substance abuse (HCC)   . Urinary urgency   . Weakness    left leg   Patient Active Problem List   Diagnosis Date Noted  . Severe episode of recurrent major depressive disorder, without psychotic features (HCC)   . Opioid type dependence, continuous (HCC) 07/02/2015  . Generalized anxiety disorder 07/02/2015  . MDD (major  depressive disorder), recurrent episode, severe (HCC) 07/01/2015  . Unspecified mood (affective) disorder (HCC) 06/28/2015  . Altered consciousness   . CAP (community acquired pneumonia) 01/07/2015  . Dehydration, mild 01/07/2015  . Tobacco abuse 01/07/2015  . Acute asthmatic bronchitis 01/07/2015  . Major depressive disorder, recurrent severe without psychotic features (HCC)   . Overdose   . Gait difficulty 02/11/2014  . Unresponsive episode 02/08/2014  . Acute back pain 09/23/2013  . Protein-calorie malnutrition, severe (HCC) 09/18/2013  . Acute respiratory failure with hypoxia (HCC) 09/18/2013  . Drug overdose 09/14/2013  . History of suicide attempt 09/14/2013  . Anxiety 09/14/2013  . Compression fracture of L1 lumbar vertebra with delayed healing 09/14/2013  . Lumbar herniated disc 09/14/2013  . Abnormal EKG 09/14/2013  . Suicide attempt by drug ingestion (HCC) 09/14/2013  . Chronic low back pain 08/22/2013  . HNP (herniated nucleus pulposus), lumbar 07/13/2013  . Bipolar disorder (HCC) 09/29/2012   Home Medication(s) Prior to Admission medications   Medication Sig Start Date End Date Taking? Authorizing Provider  clonazePAM (KLONOPIN) 1 MG tablet Take 1 mg by mouth 3 (three) times daily as needed for anxiety.  12/04/15   [provider]  hydrOXYzine (ATARAX/VISTARIL) 25 MG tablet Take 1 tablet (25 mg total) by mouth 3 (three) times daily as needed. 03/15/16   Garlon HatchetSanders, Lisa M, PA-C  methocarbamol (ROBAXIN) 500 MG tablet Take 1 tablet (500 mg total) by mouth 2 (two) times  daily. 12/23/15   Derwood Kaplan, MD  naproxen (NAPROSYN) 500 MG tablet Take 1 tablet (500 mg total) by mouth 2 (two) times daily as needed for moderate pain (spasm). 12/23/15   Derwood Kaplan, MD  nortriptyline (PAMELOR) 25 MG capsule One capsule at night for 2 weeks, then take 2 capsules at night 12/25/15   York Spaniel, MD  ondansetron [facility]) 8 MG tablet Reported on 12/25/2015 10/30/15   [provider]  pregabalin (LYRICA) 300 MG capsule Take 1 capsule (300 mg total) by mouth 2 (two) times daily. 09/11/15   Adonis Brook, NP  PROAIR RESPICLICK 108 (90 Base) MCG/ACT AEPB Inhale 1-2 puffs into the lungs every 4 (four) hours as needed (For wheezing or shortness of breath.).  11/15/15   [provider]  QUEtiapine (SEROQUEL) 200 MG tablet Take 1 tablet (200 mg total) by mouth at bedtime. 09/11/15   Adonis Brook, NP                                                                                                                                    Past Surgical History Past Surgical History:  Procedure Laterality Date  . ANTERIOR CERVICAL DECOMP/DISCECTOMY FUSION  1996   "took bone off her hip"  . BACK SURGERY    . ELBOW FRACTURE SURGERY Left 1975  . ELBOW FRACTURE SURGERY Left 2002   "redo"  . EPIDURAL BLOCK INJECTION    . FRACTURE SURGERY    . HARDWARE REMOVAL  2008   "took screw out of lower back"  . LUMBAR DISC SURGERY  2008 X 2  . PERCUTANEOUS PINNING Right 1992   "hand"  . POSTERIOR LUMBAR FUSION  2008  . TONSILLECTOMY     Family History Family History  Problem Relation Age of Onset  . Multiple sclerosis Mother   . COPD Father   . Alcohol abuse Maternal Grandfather   . Alcohol abuse Brother   . Cirrhosis Brother     Social History Social History   Tobacco Use  . Smoking status: Current Every Day Smoker    Packs/day: 0.50    Years: 18.00    Pack years: 9.00    Types: Cigarettes  . Smokeless tobacco: Never Used  Substance Use Topics  . Alcohol use: No  . Drug use: Yes    Types: Heroin, IV, Marijuana    Comment: pt states heroin last use 02/13/16, last marijuana use  last week   Allergies Minocycline and Tramadol  Review of Systems Review of Systems All other systems are reviewed and are negative for acute change except as noted in the HPI  Physical Exam Vital Signs  I have reviewed the triage vital signs BP 105/64 (BP Location: Right  Arm)   Pulse (!) 108   Temp 98.5 F (36.9 C) (Oral)   Resp 18   Ht 5\' 2"  (1.575 m)   Wt 47.2 kg (104 lb)  SpO2 (!) 80%   BMI 19.02 kg/m   Physical Exam  Constitutional: She is oriented to person, place, and time. She appears well-developed and well-nourished. No distress.  HENT:  Head: Normocephalic and atraumatic.  Nose: Nose normal.  Edentulous  Eyes: Conjunctivae and EOM are normal. Pupils are equal, round, and reactive to light. Right eye exhibits no discharge. Left eye exhibits no discharge. No scleral icterus.  Neck: Normal range of motion. Neck supple.  Cardiovascular: Normal rate and regular rhythm. Exam reveals no gallop and no friction rub.  No murmur heard. Pulmonary/Chest: Effort normal. No stridor. Tachypnea noted. No respiratory distress. She has wheezes (  Expiratory wheezes and prolonged exporatory phase). She has rhonchi in the left middle field. She has rales in the left middle field and the left lower field.  Abdominal: Soft. She exhibits no distension. There is no tenderness.  Musculoskeletal: She exhibits no edema or tenderness.  Neurological: She is alert and oriented to person, place, and time.  Skin: Skin is warm and dry. No rash noted. She is not diaphoretic. No erythema.  Psychiatric: She has a normal mood and affect.  Vitals reviewed.   ED Results and Treatments Labs (all labs ordered are listed, but only abnormal results are displayed) Labs Reviewed  COMPREHENSIVE METABOLIC PANEL - Abnormal; Notable for the following components:      Result Value   Glucose, Bld 119 (*)    Calcium 8.6 (*)    Albumin 3.3 (*)    AST 44 (*)    All other components within normal limits  CULTURE, BLOOD (ROUTINE X 2)  CULTURE, BLOOD (ROUTINE X 2)  CBC WITH DIFFERENTIAL/PLATELET  URINALYSIS, ROUTINE W REFLEX MICROSCOPIC  BLOOD GAS, ARTERIAL  I-STAT CG4 LACTIC ACID, ED  I-STAT CG4 LACTIC ACID, ED                                                                                                                          EKG  EKG Interpretation  Date/Time:    Ventricular Rate:    PR Interval:    QRS Duration:   QT Interval:    QTC Calculation:   R Axis:     Text Interpretation:        Radiology Dg Chest 2 View  Result Date: 05/06/2017 CLINICAL DATA:  Cough and shortness of breath today. Oxygen desaturation. EXAM: CHEST  2 VIEW COMPARISON:  CT chest 01/07/2015.  PA and lateral chest 03/15/2016. FINDINGS: The lungs are clear. Heart size is normal. No pneumothorax or pleural effusion. No acute bony abnormality. IMPRESSION: No acute disease. Electronically Signed   By: Drusilla Kannerhomas  Dalessio M.D.   On: 05/06/2017 14:43   Pertinent labs & imaging results that were available during my care of the patient were reviewed by me and considered in my medical decision making (see chart for details).  Medications Ordered in ED Medications  0.9 %  sodium chloride infusion (not administered)  albuterol (PROVENTIL,VENTOLIN) solution continuous neb (10  mg/hr Nebulization New Bag/Given 05/06/17 1345)  albuterol (PROVENTIL,VENTOLIN) solution continuous neb (not administered)  magnesium sulfate IVPB 2 g 50 mL (not administered)  methylPREDNISolone sodium succinate (SOLU-MEDROL) 125 mg/2 mL injection 125 mg (not administered)  ipratropium (ATROVENT) nebulizer solution 0.5 mg (0.5 mg Nebulization Given 05/06/17 1345)                                                                                                                                    Procedures Procedures  (including critical care time)  Medical Decision Making / ED Course I have reviewed the nursing notes for this encounter and the patient's prior records (if available in EHR or on provided paperwork).    Patient with bronchitic lung sounds and evidence of asthma exacerbation.  Placed on supplemental oxygen and given breathing treatments, Solu-Medrol.  Chest x-ray without evidence of pneumonia.  Labs without  leukocytosis, renal insufficiency, electrolyte derangements.  Lactic acid within normal limits.  Concern for likely viral process.  Given the myalgias, will send for influenza.  Following breathing treatment patient reported improvement shortness of breath but she still has prolonged expiratory phase and requires supplemental O2 as she desats into the high 80s without oxygen.  We will give the patient additional breathing treatment and give magnesium.  Will discuss admission for further management.  Final Clinical Impression(s) / ED Diagnoses Final diagnoses:  Severe persistent asthma with exacerbation  Bronchitis      This chart was dictated using voice recognition software.  Despite best efforts to proofread,  errors can occur which can change the documentation meaning.   Nira Conn, MD 05/06/17 1550

## 2017-05-06 NOTE — ED Triage Notes (Signed)
Pt from home with cough and shortness of breath. Pt has some wheezing EMS gave pt neb treatment x2 with improvement. Pt sats on arrival on room air is 87%
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
## 2017-05-06 NOTE — ED Notes (Signed)
Attempted report and nurse would not answer her phone.

## 2017-05-07 DIAGNOSIS — B9789 Other viral agents as the cause of diseases classified elsewhere: Secondary | ICD-10-CM | POA: Diagnosis not present

## 2017-05-07 DIAGNOSIS — R9431 Abnormal electrocardiogram [ECG] [EKG]: Secondary | ICD-10-CM | POA: Diagnosis not present

## 2017-05-07 DIAGNOSIS — F339 Major depressive disorder, recurrent, unspecified: Secondary | ICD-10-CM | POA: Diagnosis not present

## 2017-05-07 DIAGNOSIS — Z91410 Personal history of adult physical and sexual abuse: Secondary | ICD-10-CM | POA: Diagnosis not present

## 2017-05-07 DIAGNOSIS — J45901 Unspecified asthma with (acute) exacerbation: Secondary | ICD-10-CM | POA: Diagnosis not present

## 2017-05-07 DIAGNOSIS — F1911 Other psychoactive substance abuse, in remission: Secondary | ICD-10-CM

## 2017-05-07 DIAGNOSIS — J9601 Acute respiratory failure with hypoxia: Secondary | ICD-10-CM | POA: Diagnosis not present

## 2017-05-07 DIAGNOSIS — G8929 Other chronic pain: Secondary | ICD-10-CM | POA: Diagnosis not present

## 2017-05-07 LAB — CBC
HCT: 36.5 % (ref 36.0–46.0)
Hemoglobin: 11.8 g/dL — ABNORMAL LOW (ref 12.0–15.0)
MCH: 30.1 pg (ref 26.0–34.0)
MCHC: 32.3 g/dL (ref 30.0–36.0)
MCV: 93.1 fL (ref 78.0–100.0)
PLATELETS: 204 10*3/uL (ref 150–400)
RBC: 3.92 MIL/uL (ref 3.87–5.11)
RDW: 14.3 % (ref 11.5–15.5)
WBC: 4.8 10*3/uL (ref 4.0–10.5)

## 2017-05-07 LAB — BASIC METABOLIC PANEL
Anion gap: 5 (ref 5–15)
BUN: 5 mg/dL — ABNORMAL LOW (ref 6–20)
CO2: 25 mmol/L (ref 22–32)
CREATININE: 0.6 mg/dL (ref 0.44–1.00)
Calcium: 7.6 mg/dL — ABNORMAL LOW (ref 8.9–10.3)
Chloride: 112 mmol/L — ABNORMAL HIGH (ref 101–111)
Glucose, Bld: 143 mg/dL — ABNORMAL HIGH (ref 65–99)
Potassium: 3.2 mmol/L — ABNORMAL LOW (ref 3.5–5.1)
SODIUM: 142 mmol/L (ref 135–145)

## 2017-05-07 LAB — HIV ANTIBODY (ROUTINE TESTING W REFLEX): HIV Screen 4th Generation wRfx: NONREACTIVE

## 2017-05-07 MED ORDER — POTASSIUM CHLORIDE CRYS ER 20 MEQ PO TBCR
40.0000 meq | EXTENDED_RELEASE_TABLET | Freq: Two times a day (BID) | ORAL | Status: AC
  Administered 2017-05-07 (×2): 40 meq via ORAL
  Filled 2017-05-07 (×2): qty 2

## 2017-05-07 MED ORDER — CLONAZEPAM 1 MG PO TABS
1.0000 mg | ORAL_TABLET | Freq: Three times a day (TID) | ORAL | Status: DC | PRN
Start: 2017-05-07 — End: 2017-05-08

## 2017-05-07 MED ORDER — ACETAMINOPHEN 325 MG PO TABS
650.0000 mg | ORAL_TABLET | Freq: Once | ORAL | Status: AC
  Administered 2017-05-07: 650 mg via ORAL
  Filled 2017-05-07: qty 2

## 2017-05-07 NOTE — Progress Notes (Signed)
continous pulse ox 97 percent on 3 liters- will titrate o2 per primary RN
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
## 2017-05-08 LAB — BASIC METABOLIC PANEL
ANION GAP: 6 (ref 5–15)
BUN: 9 mg/dL (ref 6–20)
CHLORIDE: 110 mmol/L (ref 101–111)
CO2: 24 mmol/L (ref 22–32)
Calcium: 8.7 mg/dL — ABNORMAL LOW (ref 8.9–10.3)
Creatinine, Ser: 0.59 mg/dL (ref 0.44–1.00)
GFR calc Af Amer: >60 mL/min (ref >60)
GFR calc non Af Amer: >60 mL/min (ref >60)
Glucose, Bld: 81 mg/dL (ref 65–99)
POTASSIUM: 4.7 mmol/L (ref 3.5–5.1)
Sodium: 140 mmol/L (ref 135–145)

## 2017-05-08 MED ORDER — FLUTICASONE PROPIONATE 50 MCG/ACT NA SUSP
2.0000 | Freq: Every day | NASAL | Status: DC
  Administered 2017-05-08: 2 via NASAL
  Filled 2017-05-08: qty 16

## 2017-05-08 MED ORDER — COMBIVENT RESPIMAT 20-100 MCG/ACT IN AERS: 2.0000 | INHALATION_SPRAY | Freq: Four times a day (QID) | RESPIRATORY_TRACT | 1 refills | Status: AC | PRN

## 2017-05-08 MED ORDER — PREDNISONE 20 MG PO TABS: 40.0000 mg | ORAL_TABLET | Freq: Every day | ORAL | 0 refills | Status: AC

## 2017-05-08 MED ORDER — IPRATROPIUM-ALBUTEROL 0.5-2.5 (3) MG/3ML IN SOLN
3.0000 mL | Freq: Three times a day (TID) | RESPIRATORY_TRACT | Status: DC
  Administered 2017-05-08: 3 mL via RESPIRATORY_TRACT
  Filled 2017-05-08 (×2): qty 3

## 2017-05-08 MED ORDER — FLUTICASONE PROPIONATE 50 MCG/ACT NA SUSP: 2.0000 | Freq: Every day | NASAL | 2 refills | Status: AC
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
## 2017-05-08 NOTE — Discharge Summary (Signed)
Name: Rachael Ramirez MRN: [id number] DOB: 06/01/1971 46 y.o. PCP: Aida PufferLittle, James, MD  Date of Admission: 05/06/2017 12:49 PM Date of Discharge: 05/08/2017 Attending Physician: Burns SpainButcher, Elizabeth A, MD  Discharge Diagnosis: 1. Asthma Exacerbation   Discharge Medications: Allergies as of 05/08/2017      Reactions   Minocycline Nausea And Vomiting   Tramadol Nausea And Vomiting      Medication List    TAKE these medications   buPROPion 300 MG 24 hr tablet Commonly known as:  WELLBUTRIN XL Take 300 mg daily by mouth.   clonazePAM 1 MG tablet Commonly known as:  KLONOPIN Take 1 mg by mouth 3 (three) times daily as needed for anxiety.   COMBIVENT RESPIMAT 20-100 MCG/ACT Aers respimat Generic drug:  Ipratropium-Albuterol Inhale 2 puffs every 6 (six) hours as needed into the lungs. What changed:  when to take this   fluticasone 50 MCG/ACT nasal spray Commonly known as:  FLONASE Place 2 sprays daily into both nostrils.   hydrOXYzine 25 MG tablet Commonly known as:  ATARAX/VISTARIL Take 1 tablet (25 mg total) by mouth 3 (three) times daily as needed.   methocarbamol 500 MG tablet Commonly known as:  ROBAXIN Take 1 tablet (500 mg total) by mouth 2 (two) times daily.   naproxen 500 MG tablet Commonly known as:  NAPROSYN Take 1 tablet (500 mg total) by mouth 2 (two) times daily as needed for moderate pain (spasm).   nortriptyline 25 MG capsule Commonly known as:  PAMELOR One capsule at night for 2 weeks, then take 2 capsules at night   omeprazole 20 MG capsule Commonly known as:  PRILOSEC Take 20 mg daily by mouth.   predniSONE 20 MG tablet Commonly known as:  DELTASONE Take 2 tablets (40 mg total) daily with breakfast by mouth. Start taking on:  05/09/2017   pregabalin 300 MG capsule Commonly known as:  LYRICA Take 1 capsule (300 mg total) by mouth 2 (two) times daily. What changed:  when to take this   PROAIR RESPICLICK 108 (90 Base) MCG/ACT Aepb Generic drug:   Albuterol Sulfate Inhale 1-2 puffs into the lungs every 4 (four) hours as needed (For wheezing or shortness of breath.).   QUEtiapine 200 MG tablet Commonly known as:  SEROQUEL Take 1 tablet (200 mg total) by mouth at bedtime.   SUBOXONE 8-2 MG Film Generic drug:  Buprenorphine HCl-Naloxone HCl Take 1 Film 2 (two) times daily by mouth.   traZODone 100 MG tablet Commonly known as:  DESYREL Take 100 mg at bedtime by mouth.   Vitamin D (Ergocalciferol) 50000 units Caps capsule Commonly known as:  DRISDOL Take 50,000 Units once a week by mouth.       Disposition and follow-up:   Rachael Ramirez [facility] St[facility].  At the hospital follow up visit please address:  1.  Asthma Exacerbation: Discharged on her home combivent inhaler PRN and a prescription to complete a 5 day course of prednisone 40 mg qd (3 days left). Recommend repeat PFTs after resolution of acute illness. Consider adding inhaled corticosteroid for maintenance therapy if symptoms remain uncontrolled.   2.  Labs / imaging needed at time of follow-up: None  3.  Pending labs/ test needing follow-up: PFTs  Follow-up Appointments:   Hospital Course by problem list:  1. Acute Asthma Exacerbation: Likely secondary to viral URI. Patient has a history of asthma currently prescribed Combivent PRN but no maintenance inhalers. She presented with 1  week of productive cough and progressively worsening SOB and wheezing. On arrival she was found to be hypoxic to the low 80s requiring 6L of high flow nasal cannula to maintain oxygenation. She was otherwise hemodynamically stable with diffuse end expiratory wheezing heard in all lung fields. CXR was negative for infiltrate or consolidation. In the ED she was given albuterol, atrovent, IV solumedrol, IV magnesium, and 1L NS bolus. Hypoxia and wheezing persistent and she was admitted for further care. She remained afebrile without  leukocytosis. Flu panel was negative. She was treated with scheduled duonebs q6h and prednisone 40 mg daily. Oxygenation slowly improved over the next 48 hours and she was successfully weaned off oxygen. She was discharged with a prescription for prednsione 40 mg to complete a 5 day course (3 days left), refill of her home combivent inhaler PRN, and daily flonase for her sinus congestion. She was instructed to follow up with her primary care doctor in the next 2 weeks.   Discharge Vitals:   BP 140/81 (BP Location: Right Arm)   Pulse (!) 54   Temp 98.3 F (36.8 C) (Oral)   Resp 14   Ht 5\' 2"  (1.575 m)   Wt 119 lb 8 oz (54.2 kg)   SpO2 94%   BMI 21.86 kg/m   Pertinent Labs, Studies, and Procedures:   05/06/2017 CXR 2 View:  FINDINGS: The lungs are clear. Heart size is normal. No pneumothorax or pleural effusion. No acute bony abnormality.  IMPRESSION: No acute disease.  Influenza PCR:  Ref. Range 05/06/2017 18:51  Influenza A By PCR Latest Ref Range: NEGATIVE  NEGATIVE  Influenza B By PCR Latest Ref Range: NEGATIVE  NEGATIVE   CMP / BMP  Ref. Range 05/06/2017  05/07/2017  05/08/2017   Sodium Latest Ref Range: 135 - 145 mmol/L 138 142 140  Potassium Latest Ref Range: 3.5 - 5.1 mmol/L 3.7 3.2 (L) 4.7  Chloride Latest Ref Range: 101 - 111 mmol/L 104 112 (H) 110  CO2 Latest Ref Range: 22 - 32 mmol/L 26 25 24   Glucose Latest Ref Range: 65 - 99 mg/dL 161119 (H) 096143 (H) 81  BUN Latest Ref Range: 6 - 20 mg/dL 7 5 (L) 9  Creatinine Latest Ref Range: 0.44 - 1.00 mg/dL 0.450.77 4.090.60 8.110.59  Calcium Latest Ref Range: 8.9 - 10.3 mg/dL 8.6 (L) 7.6 (L) 8.7 (L)  Anion gap Latest Ref Range: 5 - 15  8 5 6   Alkaline Phosphatase Latest Ref Range: 38 - 126 U/L 92    Albumin Latest Ref Range: 3.5 - 5.0 g/dL 3.3 (L)    AST Latest Ref Range: 15 - 41 U/L 44 (H)    ALT Latest Ref Range: 14 - 54 U/L 25    Total Protein Latest Ref Range: 6.5 - 8.1 g/dL 6.5    Total Bilirubin Latest Ref Range: 0.3 - 1.2 mg/dL  0.9    GFR, Est Non African American Latest Ref Range: >60 mL/min >60 >60 >60  GFR, Est African American Latest Ref Range: >60 mL/min >60 >60 >60   CBC:  Ref. Range 05/06/2017 13:58 05/07/2017 04:06  WBC Latest Ref Range: 4.0 - 10.5 K/uL 7.4 4.8  RBC Latest Ref Range: 3.87 - 5.11 MIL/uL 4.17 3.92  Hemoglobin Latest Ref Range: 12.0 - 15.0 g/dL 91.412.6 78.211.8 (L)  HCT Latest Ref Range: 36.0 - 46.0 % 38.8 36.5  MCV Latest Ref Range: 78.0 - 100.0 fL 93.0 93.1  MCH Latest Ref Range: 26.0 - 34.0 pg 30.2  30.1  MCHC Latest Ref Range: 30.0 - 36.0 g/dL 96.0 45.4  RDW Latest Ref Range: 11.5 - 15.5 % 14.3 14.3  Platelets Latest Ref Range: 150 - 400 K/uL 206 204    Discharge Instructions: Discharge Instructions    Call MD for:  difficulty breathing, headache or visual disturbances   Complete by:  As directed    Call MD for:  persistant dizziness or light-headedness   Complete by:  As directed    Call MD for:  temperature >100.4   Complete by:  As directed    Diet - low sodium heart healthy   Complete by:  As directed    Discharge instructions   Complete by:  As directed    Rachael Ramirez,  It was a pleasure taking care of you. I am glad you are feeling better. Please continue to take prednisone 40 mg daily starting tomorrow with breakfast to complete your course (3 days remaining). You may continue to use your home combivent inhaler every 6 hours as need for shortness of breath or wheezing. Please follow up with your primary care provider in the next 1-2 weeks. Thank you!  - Dr. Antony Contras   Increase activity slowly   Complete by:  As directed       Signed: Reymundo Poll, MD 05/08/2017, 3:24 PM   Pager: [phone]

## 2017-05-08 NOTE — Progress Notes (Signed)
SATURATION QUALIFICATIONS: (This note is used to comply with regulatory documentation for home oxygen)  Patient Saturations on Room Air at Rest = 94%  Patient Saturations on Room Air while Ambulating = 92-94%  Patient Saturations on 1 Liter of oxygen while Ambulating = 95%  Please briefly explain why patient needs home oxygen:

## 2017-05-08 NOTE — Progress Notes (Addendum)
CSW was contacted by patients RN in regards to patients discharge.  RN stated patient has decided to discharge to her mothers home in [place] [phone]([address], [place] [phone]). CSW assisted patient with cab voucher to home to her mothers house with Social Work [employer] approval. CSW gave cab voucher to RN to call once patient was dressed and ready.  CSW signing off as patient no longer has social work needs  6:30:  CSW offered patient cab voucher but per RN but will have Husband pick her up from hospital and take her to adult sons home. RN advised patient not to let husband pick her up from hospital and that the hospital would be able to arrange safer means of transportation. Patient declined and stated she will go her her husband and she will stay with son.  Marrianne MoodAshley Aeryn Medici, MSW,  [employer] [phone]

## 2017-05-08 NOTE — Progress Notes (Signed)
Rachael Ramirez to be D/C'd Home per MD order.  Discussed with the patient and all questions fully answered.  VSS, Skin clean, dry and intact without evidence of skin break down, no evidence of skin tears noted. IV catheter discontinued intact. Site without signs and symptoms of complications. Dressing and pressure applied.  An After Visit Summary was printed and given to the patient. Patient received prescription.  Allergies as of 05/08/2017      Reactions   Minocycline Nausea And Vomiting   Tramadol Nausea And Vomiting      Medication List    TAKE these medications   buPROPion 300 MG 24 hr tablet Commonly known as:  WELLBUTRIN XL Take 300 mg daily by mouth.   clonazePAM 1 MG tablet Commonly known as:  KLONOPIN Take 1 mg by mouth 3 (three) times daily as needed for anxiety.   COMBIVENT RESPIMAT 20-100 MCG/ACT Aers respimat Generic drug:  Ipratropium-Albuterol Inhale 2 puffs every 6 (six) hours as needed into the lungs. What changed:  when to take this   fluticasone 50 MCG/ACT nasal spray Commonly known as:  FLONASE Place 2 sprays daily into both nostrils.   hydrOXYzine 25 MG tablet Commonly known as:  ATARAX/VISTARIL Take 1 tablet (25 mg total) by mouth 3 (three) times daily as needed.   methocarbamol 500 MG tablet Commonly known as:  ROBAXIN Take 1 tablet (500 mg total) by mouth 2 (two) times daily.   naproxen 500 MG tablet Commonly known as:  NAPROSYN Take 1 tablet (500 mg total) by mouth 2 (two) times daily as needed for moderate pain (spasm).   nortriptyline 25 MG capsule Commonly known as:  PAMELOR One capsule at night for 2 weeks, then take 2 capsules at night   omeprazole 20 MG capsule Commonly known as:  PRILOSEC Take 20 mg daily by mouth.   predniSONE 20 MG tablet Commonly known as:  DELTASONE Take 2 tablets (40 mg total) daily with breakfast by mouth. Start taking on:  05/09/2017   pregabalin 300 MG capsule Commonly known as:  LYRICA Take 1 capsule  (300 mg total) by mouth 2 (two) times daily. What changed:  when to take this   PROAIR RESPICLICK 108 (90 Base) MCG/ACT Aepb Generic drug:  Albuterol Sulfate Inhale 1-2 puffs into the lungs every 4 (four) hours as needed (For wheezing or shortness of breath.).   QUEtiapine 200 MG tablet Commonly known as:  SEROQUEL Take 1 tablet (200 mg total) by mouth at bedtime.   SUBOXONE 8-2 MG Film Generic drug:  Buprenorphine HCl-Naloxone HCl Take 1 Film 2 (two) times daily by mouth.   traZODone 100 MG tablet Commonly known as:  DESYREL Take 100 mg at bedtime by mouth.   Vitamin D (Ergocalciferol) 50000 units Caps capsule Commonly known as:  DRISDOL Take 50,000 Units once a week by mouth.      D/c education completed with patient/family including follow up instructions, medication list, d/c activities limitations if indicated, with other d/c instructions as indicated by MD - patient able to verbalize understanding, all questions fully answered.   Patient instructed to return to ED, call 911, or call MD for any changes in condition.   Patient escorted via WC, and D/C home via private auto.  Rachael Ramirez  Rachael Ramirez 05/08/2017 6:57 PM
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
## 2017-05-08 NOTE — Progress Notes (Signed)
Pt was offered a a ride with a taxi to her mother house but she declined stating that husband is going to come and drop her off with her son. Advised patient that it would not be safe but patients wants to continue with plan.

## 2017-05-11 LAB — CULTURE, BLOOD (ROUTINE X 2)
CULTURE: NO GROWTH
Culture: NO GROWTH
SPECIAL REQUESTS: ADEQUATE
Special Requests: ADEQUATE

## 2017-09-28 ENCOUNTER — Other Ambulatory Visit (HOSPITAL_COMMUNITY): Payer: Self-pay

## 2017-09-28 ENCOUNTER — Other Ambulatory Visit: Payer: Self-pay

## 2017-10-07 ENCOUNTER — Encounter (HOSPITAL_COMMUNITY): Payer: Self-pay | Admitting: Emergency Medicine

## 2017-10-07 ENCOUNTER — Emergency Department (HOSPITAL_COMMUNITY)
Admission: EM | Admit: 2017-10-07 | Discharge: 2017-10-08 | Disposition: A | Discharge status: Home / Self Care | Payer: Managed Care, Other (non HMO) | Attending: Emergency Medicine | Admitting: Emergency Medicine

## 2017-10-07 ENCOUNTER — Other Ambulatory Visit: Payer: Self-pay

## 2017-10-07 DIAGNOSIS — R1084 Generalized abdominal pain: Secondary | ICD-10-CM | POA: Diagnosis not present

## 2017-10-07 DIAGNOSIS — F17210 Nicotine dependence, cigarettes, uncomplicated: Secondary | ICD-10-CM | POA: Diagnosis not present

## 2017-10-07 DIAGNOSIS — Z79899 Other long term (current) drug therapy: Secondary | ICD-10-CM | POA: Diagnosis not present

## 2017-10-07 DIAGNOSIS — J189 Pneumonia, unspecified organism: Secondary | ICD-10-CM | POA: Diagnosis not present

## 2017-10-07 DIAGNOSIS — J45909 Unspecified asthma, uncomplicated: Secondary | ICD-10-CM | POA: Insufficient documentation

## 2017-10-07 DIAGNOSIS — R109 Unspecified abdominal pain: Secondary | ICD-10-CM | POA: Diagnosis present

## 2017-10-07 DIAGNOSIS — J181 Lobar pneumonia, unspecified organism: Secondary | ICD-10-CM

## 2017-10-07 LAB — COMPREHENSIVE METABOLIC PANEL
ALBUMIN: 3.5 g/dL (ref 3.5–5.0)
ALK PHOS: 111 U/L (ref 38–126)
ALT: 12 U/L — AB (ref 14–54)
AST: 26 U/L (ref 15–41)
Anion gap: 10 (ref 5–15)
BILIRUBIN TOTAL: 0.4 mg/dL (ref 0.3–1.2)
BUN: 10 mg/dL (ref 6–20)
CO2: 24 mmol/L (ref 22–32)
CREATININE: 0.76 mg/dL (ref 0.44–1.00)
Calcium: 9.1 mg/dL (ref 8.9–10.3)
Chloride: 104 mmol/L (ref 101–111)
GFR calc Af Amer: >60 mL/min (ref >60)
GFR calc non Af Amer: >60 mL/min (ref >60)
GLUCOSE: 112 mg/dL — AB (ref 65–99)
Potassium: 3.9 mmol/L (ref 3.5–5.1)
Sodium: 138 mmol/L (ref 135–145)
TOTAL PROTEIN: 7.1 g/dL (ref 6.5–8.1)

## 2017-10-07 LAB — CBC
HCT: 37.9 % (ref 36.0–46.0)
Hemoglobin: 12.4 g/dL (ref 12.0–15.0)
MCH: 30 pg (ref 26.0–34.0)
MCHC: 32.7 g/dL (ref 30.0–36.0)
MCV: 91.8 fL (ref 78.0–100.0)
Platelets: 245 10*3/uL (ref 150–400)
RBC: 4.13 MIL/uL (ref 3.87–5.11)
RDW: 14.5 % (ref 11.5–15.5)
WBC: 19.5 10*3/uL — ABNORMAL HIGH (ref 4.0–10.5)

## 2017-10-07 LAB — URINALYSIS, ROUTINE W REFLEX MICROSCOPIC
BILIRUBIN URINE: NEGATIVE
Glucose, UA: NEGATIVE mg/dL
Ketones, ur: NEGATIVE mg/dL
Leukocytes, UA: NEGATIVE
NITRITE: NEGATIVE
PH: 8 (ref 5.0–8.0)
Protein, ur: NEGATIVE mg/dL
SPECIFIC GRAVITY, URINE: 1.006 (ref 1.005–1.030)

## 2017-10-07 LAB — I-STAT BETA HCG BLOOD, ED (MC, WL, AP ONLY): I-stat hCG, quantitative: <5 m[IU]/mL (ref <5)

## 2017-10-07 LAB — LIPASE, BLOOD: Lipase: 18 U/L (ref 11–51)

## 2017-10-07 LAB — I-STAT TROPONIN, ED: Troponin i, poc: 0 ng/mL (ref 0.00–0.08)

## 2017-10-07 MED ORDER — IOPAMIDOL (ISOVUE-300) INJECTION 61%
INTRAVENOUS | Status: AC
  Filled 2017-10-07: qty 100

## 2017-10-07 NOTE — ED Triage Notes (Signed)
Pt from home. Reports 3 weeks of abdominal pain and unexplained weight gain 8-10 lb in last 2 weeks. . Was seen at urgent care today and sent here d/t abdominal distention. Feels "light headed" and like she will "pass out".  SHOB with exertion. Not eating.

## 2017-10-07 NOTE — ED Provider Notes (Signed)
Patient placed in Quick Look pathway, seen and evaluated   Chief Complaint: Abdominal pain, malaise  HPI:   47 year old female presents with worsening abdominal pain over the past 3 weeks with distention and weight gain.  Past medical history significant for bipolar disorder, chronic back pain, tobacco abuse, asthma, anxiety, opioid dependence.  She states that her symptoms have progressively worsened.  She has difficulty telling me where the pain is and states that it seems to be on the left side and shoots across to the other side.  She reports a fever of 103 today.  She went to urgent care and was advised to come to the emergency department.  She has had nausea without vomiting.  No change in bowel movement.  She has had urinary hesitancy.  She reports generalized malaise, headaches, lightheadedness.  No prior abdominal surgeries or history of colonoscopy  ROS: +abdominal pain, distension, fever, urinary hesitancy, nausea, malaise, lightheaded, headache  -chest pain, cough, diarrhea  Physical Exam:   Gen: No distress. Odd, withdrawn affect  Neuro: Awake and Alert  Skin: Warm    Focused Exam: Heart: Regular rate and rhythm    Lungs: Mild expiratory wheezes    Abdomen: Generalized tenderness.  The patient reports that her abdomen is distended however it seems that this is just adipose tissue   Initiation of care has begun. The patient has been counseled on the process, plan, and necessity for staying for the completion/evaluation, and the remainder of the medical screening examination    Rachael Ramirez, Rachael Peretti Marie, PA-C 10/07/17 2058    Benjiman CorePickering, Nathan, MD 10/07/17 2221

## 2017-10-08 ENCOUNTER — Emergency Department (HOSPITAL_COMMUNITY): Payer: Managed Care, Other (non HMO)

## 2017-10-08 MED ORDER — SODIUM CHLORIDE 0.9 % IV BOLUS
1000.0000 mL | Freq: Once | INTRAVENOUS | Status: AC
  Administered 2017-10-08: 1000 mL via INTRAVENOUS

## 2017-10-08 MED ORDER — ALBUTEROL SULFATE HFA 108 (90 BASE) MCG/ACT IN AERS
2.0000 | INHALATION_SPRAY | RESPIRATORY_TRACT | Status: DC | PRN
  Administered 2017-10-08: 2 via RESPIRATORY_TRACT
  Filled 2017-10-08: qty 6.7

## 2017-10-08 MED ORDER — AEROCHAMBER PLUS FLO-VU LARGE MISC
1.0000 | Freq: Once | Status: AC
  Administered 2017-10-08: 1

## 2017-10-08 MED ORDER — SODIUM CHLORIDE 0.9 % IV BOLUS
1000.0000 mL | Freq: Once | INTRAVENOUS | Status: AC
Start: 2017-10-08 — End: 2017-10-08
  Administered 2017-10-08: 1000 mL via INTRAVENOUS

## 2017-10-08 MED ORDER — AEROCHAMBER PLUS FLO-VU LARGE MISC
Status: AC
  Filled 2017-10-08: qty 1

## 2017-10-08 MED ORDER — AMOXICILLIN-POT CLAVULANATE 875-125 MG PO TABS: 1.0000 | ORAL_TABLET | Freq: Two times a day (BID) | ORAL | 0 refills | Status: AC

## 2017-10-08 MED ORDER — IOPAMIDOL (ISOVUE-300) INJECTION 61%
100.0000 mL | Freq: Once | INTRAVENOUS | Status: AC | PRN
  Administered 2017-10-08: 100 mL via INTRAVENOUS

## 2017-10-08 NOTE — ED Notes (Signed)
Pt. Ambulated to and from bathroom. O2 satswere between 90-93%. Pt. Stated she did not feel short of breath of dizzy.

## 2017-10-08 NOTE — ED Provider Notes (Signed)
[facility] EMERGENCY DEPARTMENT Provider Note   CSN: [id number] Arrival date & time: 10/07/17  2016     History   Chief Complaint Chief Complaint  Patient presents with  . Abdominal Pain    HPI Rachael Ramirez is a 47 y.o. female with a hx of anxiety, asthma, chronic back pain, bipolar disorder, GERD, HTN, substance abuse presents to the Emergency Department complaining of gradual, persistent, progressively worsening generalized and LLQ abd pain onset 3 weeks ago. Associated symptoms include abd distension.  Pt reports taking ibuprofen and tylenol without relief.  Pt reports last BM was 2-3 days ago.  She reports her BMs are normally hard and sometimes small.  No aggravating or alleviating factors.  She reports some decreased appetite because this causes her bloating to be worse. Pt denies EtOH usage.  Pt denies abd surgeries.  Pt states she came tonight because her mother made her.  Pt denies IVDU in the last 2 years.    The history is provided by the patient and medical records. No language interpreter was used.    Past Medical History:  Diagnosis Date  . Anxiety    takes Klonopin daily as needed  . Asthma    uses Combivent daily as needed  . Back pain   . Bipolar 1 disorder (HCC)    takes Lamictal daily  . CAP (community acquired pneumonia) 01/07/2015  . Chronic low back pain    HNP and Radiculopathy  . Collapsed lung 3/15   "while in hospital awaiting back OR"  . Cough   . GERD (gastroesophageal reflux disease)    takes Omeprazole daily  . Hypertension   . Insomnia    takes Trazodone nightly  . MRSA (methicillin resistant Staphylococcus aureus)    Postive nasal swab  . PONV (postoperative nausea and vomiting)   . Spinal headache   . Substance abuse (HCC)   . Urinary urgency   . Weakness    left leg    Patient Active Problem List   Diagnosis Date Noted  . Opioid type dependence, continuous (HCC) 07/02/2015  . Generalized anxiety disorder  07/02/2015  . Tobacco abuse 01/07/2015  . Major depressive disorder, recurrent severe without psychotic features (HCC)   . Protein-calorie malnutrition, severe (HCC) 09/18/2013  . Acute respiratory failure with hypoxia (HCC) 09/18/2013  . History of suicide attempt 09/14/2013  . Compression fracture of L1 lumbar vertebra with delayed healing 09/14/2013  . Chronic low back pain 08/22/2013  . Bipolar disorder (HCC) 09/29/2012    Past Surgical History:  Procedure Laterality Date  . ANTERIOR CERVICAL DECOMP/DISCECTOMY FUSION  1996   "took bone off her hip"  . BACK SURGERY    . ELBOW FRACTURE SURGERY Left 1975  . ELBOW FRACTURE SURGERY Left 2002   "redo"  . EPIDURAL BLOCK INJECTION    . FRACTURE SURGERY    . HARDWARE REMOVAL  2008   "took screw out of lower back"  . LUMBAR DISC SURGERY  2008 X 2  . LUMBAR LAMINECTOMY/DECOMPRESSION MICRODISCECTOMY Left 07/13/2013   Procedure: Left Lumbar four-five microdiskectomy;  Surgeon: Carmela Hurt, MD;  Location: [facility];  Service: Neurosurgery;  Laterality: Left;  Left Lumbar four-five microdiskectomy  . LUMBAR WOUND DEBRIDEMENT N/A 09/21/2013   Procedure: LUMBAR WOUND re-exploration and disectomy;  Surgeon: Carmela Hurt, MD;  Location: [facility];  Service: Neurosurgery;  Laterality: N/A;  . PERCUTANEOUS PINNING Right 1992   "hand"  . POSTERIOR LUMBAR FUSION  2008  .  TONSILLECTOMY       OB History   None      Home Medications    Prior to Admission medications   Medication Sig Start Date End Date Taking? Authorizing Provider  buPROPion (WELLBUTRIN XL) 300 MG 24 hr tablet Take 300 mg daily by mouth. 05/02/17   [provider]  clonazePAM (KLONOPIN) 1 MG tablet Take 1 mg by mouth 3 (three) times daily as needed for anxiety.  12/04/15   [provider]  COMBIVENT RESPIMAT 20-100 MCG/ACT AERS respimat Inhale 2 puffs every 6 (six) hours as needed into the lungs. 05/08/17   Reymundo Poll, MD  fluticasone (FLONASE)  50 MCG/ACT nasal spray Place 2 sprays daily into both nostrils. 05/08/17   Reymundo Poll, MD  hydrOXYzine (ATARAX/VISTARIL) 25 MG tablet Take 1 tablet (25 mg total) by mouth 3 (three) times daily as needed. Patient not taking: Reported on 05/06/2017 03/15/16   Garlon Hatchet, PA-C  methocarbamol (ROBAXIN) 500 MG tablet Take 1 tablet (500 mg total) by mouth 2 (two) times daily. Patient not taking: Reported on 05/06/2017 12/23/15   Derwood Kaplan, MD  naproxen (NAPROSYN) 500 MG tablet Take 1 tablet (500 mg total) by mouth 2 (two) times daily as needed for moderate pain (spasm). Patient not taking: Reported on 05/06/2017 12/23/15   Derwood Kaplan, MD  nortriptyline (PAMELOR) 25 MG capsule One capsule at night for 2 weeks, then take 2 capsules at night Patient not taking: Reported on 05/06/2017 12/25/15   York Spaniel, MD  omeprazole (PRILOSEC) 20 MG capsule Take 20 mg daily by mouth.    [provider]  predniSONE (DELTASONE) 20 MG tablet Take 2 tablets (40 mg total) daily with breakfast by mouth. 05/09/17   Reymundo Poll, MD  pregabalin (LYRICA) 300 MG capsule Take 1 capsule (300 mg total) by mouth 2 (two) times daily. Patient taking differently: Take 300 mg 3 (three) times daily by mouth.  09/11/15   Adonis Brook, NP  PROAIR RESPICLICK 108 (90 Base) MCG/ACT AEPB Inhale 1-2 puffs into the lungs every 4 (four) hours as needed (For wheezing or shortness of breath.).  11/15/15   [provider]  QUEtiapine (SEROQUEL) 200 MG tablet Take 1 tablet (200 mg total) by mouth at bedtime. Patient not taking: Reported on 05/06/2017 09/11/15   Adonis Brook, NP  SUBOXONE 8-2 MG FILM Take 1 Film 2 (two) times daily by mouth. 04/21/17   [provider]  traZODone (DESYREL) 100 MG tablet Take 100 mg at bedtime by mouth.    [provider]  Vitamin D, Ergocalciferol, (DRISDOL) 50000 units CAPS capsule Take 50,000 Units once a week by mouth. 05/02/17   [provider]    Family History Family History  Problem Relation Age of Onset  . Multiple sclerosis Mother   . COPD Father   . Alcohol abuse Maternal Grandfather   . Alcohol abuse Brother   . Cirrhosis Brother     Social History Social History   Tobacco Use  . Smoking status: Current Every Day Smoker    Packs/day: 0.50    Years: 18.00    Pack years: 9.00    Types: Cigarettes  . Smokeless tobacco: Never Used  Substance Use Topics  . Alcohol use: No  . Drug use: Yes    Types: Heroin, IV, Marijuana    Comment: pt states heroin last use 02/13/16, last marijuana use  last week     Allergies   Minocycline and Tramadol  Review of Systems Review of Systems  Constitutional: Negative for appetite change, diaphoresis, fatigue, fever and unexpected weight change.  HENT: Negative for mouth sores.   Eyes: Negative for visual disturbance.  Respiratory: Positive for cough. Negative for chest tightness, shortness of breath and wheezing.   Cardiovascular: Negative for chest pain.  Gastrointestinal: Positive for abdominal distention and abdominal pain. Negative for constipation, diarrhea, nausea and vomiting.  Endocrine: Negative for polydipsia, polyphagia and polyuria.  Genitourinary: Negative for dysuria, frequency, hematuria and urgency.  Musculoskeletal: Negative for back pain and neck stiffness.  Skin: Negative for rash.  Allergic/Immunologic: Negative for immunocompromised state.  Neurological: Negative for syncope, light-headedness and headaches.  Hematological: Does not bruise/bleed easily.  Psychiatric/Behavioral: Negative for sleep disturbance. The patient is not nervous/anxious.      Physical Exam Updated Vital Signs BP 112/82 (BP Location: Right Arm)   Pulse 77   Temp 98.3 F (36.8 C) (Oral)   Resp 16   SpO2 97%   Physical Exam  Constitutional: She appears well-developed and well-nourished. No distress.  Awake, alert, nontoxic appearance  HENT:  Head: Normocephalic  and atraumatic.  Mouth/Throat: Oropharynx is clear and moist. No oropharyngeal exudate.  Eyes: Conjunctivae are normal. No scleral icterus.  Neck: Normal range of motion. Neck supple.  Cardiovascular: Normal rate, regular rhythm and intact distal pulses.  Pulmonary/Chest: Effort normal. No respiratory distress. She has decreased breath sounds in the right middle field and the right lower field. She has no wheezes. She has rales in the right lower field.  Equal chest expansion Decreased breath sounds with rales in the right lower fields  Abdominal: Soft. Bowel sounds are normal. She exhibits distension. She exhibits no mass. There is generalized tenderness. There is no rebound and no guarding.  Musculoskeletal: Normal range of motion. She exhibits no edema.  Neurological: She is alert.  Speech is clear and goal oriented Moves extremities without ataxia  Skin: Skin is warm and dry. She is not diaphoretic.  No splinter hemorrhages No petechiae  Psychiatric: She has a normal mood and affect.  Nursing note and vitals reviewed.    ED Treatments / Results  Labs (all labs ordered are listed, but only abnormal results are displayed) Labs Reviewed  COMPREHENSIVE METABOLIC PANEL - Abnormal; Notable for the following components:      Result Value   Glucose, Bld 112 (*)    ALT 12 (*)    All other components within normal limits  CBC - Abnormal; Notable for the following components:   WBC 19.5 (*)    All other components within normal limits  URINALYSIS, ROUTINE W REFLEX MICROSCOPIC - Abnormal; Notable for the following components:   Color, Urine STRAW (*)    Hgb urine dipstick SMALL (*)    Bacteria, UA RARE (*)    Squamous Epithelial / LPF 0-5 (*)    All other components within normal limits  CULTURE, BLOOD (ROUTINE X 2)  CULTURE, BLOOD (ROUTINE X 2)  LIPASE, BLOOD  I-STAT TROPONIN, ED  I-STAT BETA HCG BLOOD, ED ([facility], [facility], [facility] ONLY)    EKG EKG Interpretation  Date/Time:  Saturday  October 08 2017 07:02:18 EDT Ventricular Rate:  86 PR Interval:    QRS Duration: 103 QT Interval:  383 QTC Calculation: 459 R Axis:   80 Text Interpretation:  Sinus rhythm No significant change was found Confirmed by Glynn Octaveancour, Stephen [phone]([id number]) on 10/08/2017 7:05:20 AM   Radiology Ct Abdomen Pelvis W Contrast  Result Date: 10/08/2017 CLINICAL DATA:  Abdominal  pain for 3 weeks. Unexplained weight loss for 2 weeks. History of lumbar spine surgery, polysubstance abuse. EXAM: CT ABDOMEN AND PELVIS WITH CONTRAST TECHNIQUE: Multidetector CT imaging of the abdomen and pelvis was performed using the standard protocol following bolus administration of intravenous contrast. CONTRAST:  ISOVUE-300 IOPAMIDOL (ISOVUE-300) INJECTION 61% COMPARISON:  Lumbar spine radiographs December 23, 2015 CT lumbar spine Oct 29, 2011 FINDINGS: LOWER CHEST: Patchy consolidation RIGHT lower lobe with ground-glass component. Included heart size is normal. No pericardial effusions. HEPATOBILIARY: Liver and gallbladder are normal. PANCREAS: Normal. SPLEEN: Normal. ADRENALS/URINARY TRACT: Kidneys are orthotopic, demonstrating symmetric enhancement. No nephrolithiasis, hydronephrosis or solid renal masses. The unopacified ureters are normal in course and caliber. Delayed imaging through the kidneys demonstrates symmetric prompt contrast excretion within the proximal urinary collecting system. Urinary bladder is well distended and unremarkable. Normal adrenal glands. STOMACH/BOWEL: The stomach, small and large bowel are normal in course and caliber without inflammatory changes. Moderate to large amount of retained large bowel stool. Normal appendix. VASCULAR/LYMPHATIC: Aortoiliac vessels are normal in course and caliber. Mild calcific atherosclerosis. No lymphadenopathy by CT size criteria. REPRODUCTIVE: Normal. OTHER: No intraperitoneal free fluid or free air. MUSCULOSKELETAL: Nonacute. Worsening grade 1 L4-5 retrolisthesis. Status post L5-S1  PLIF with arthrodesis. Severe LEFT L4-5 neural foraminal narrowing. IMPRESSION: 1. Multifocal RIGHT lower lobe pneumonia. 2. Moderate to large amount of retained large bowel stool without bowel obstruction nor acute intra-abdominopelvic process. 3. L5-S1 PLIF with arthrodesis. 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 Aortic Atherosclerosis (ICD10-I70.0). Electronically Signed   By: Awilda Metro M.D.   On: 10/08/2017 02:49    Procedures Procedures (including critical care time)  Medications Ordered in ED Medications  iopamidol (ISOVUE-300) 61 % injection (has no administration in time range)  albuterol (PROVENTIL HFA;VENTOLIN HFA) 108 (90 Base) MCG/ACT inhaler 2 puff (2 puffs Inhalation Given 10/08/17 0628)  sodium chloride 0.9 % bolus 1,000 mL (1,000 mLs Intravenous New Bag/Given 10/08/17 0651)  iopamidol (ISOVUE-300) 61 % injection 100 mL (100 mLs Intravenous Contrast Given 10/08/17 0225)  AEROCHAMBER PLUS FLO-VU LARGE MISC 1 each (1 each Other Given 10/08/17 1610)     Initial Impression / Assessment and Plan / ED Course  I have reviewed the triage vital signs and the nursing notes.  Pertinent labs & imaging results that were available during my care of the patient were reviewed by me and considered in my medical decision making (see chart for details).  Clinical Course as of Oct 08 704  Sat Oct 08, 2017  0646 Pt ambulates with oxygen saturations > 90%.   [HM]  0647 Pt is sleeping, but BP is soft.  Will give some fluids.    BP: 90/62 [HM]  0705 Sniffing it leukocytosis.  Suspect this is secondary to her pneumonia.  WBC(!): 19.5 [HM]    Clinical Course User Index [HM] Marinna Blane, Boyd Kerbs    Patient presents with several weeks of generalized abdominal pain.  Abdomen is somewhat distended but soft on exam.  No rebound or guarding.  Patient is afebrile without tachycardia.  CT scan shows large stool  burden which is likely the cause of her pain.  No other acute abnormality noted on CT scan including appendicitis, colitis, cholecystitis.  I personally evaluated these images.  Of note, patient does have right lower lobe pneumonia as noted on CT scan.  I suspect this is likely an aspiration pneumonia given her drug history and somnolence here in the  emergency department.  She will be treated with Augmentin.  Patient without cardiac murmur.  Doubt endocarditis at this time.  Patient ambulates here in the emergency department with oxygen saturations above 90%.  On repeat exam, she is sleeping and blood pressure is somewhat soft.  She will received fluids.  The patient was discussed with and seen by Dr. Manus Gunning who agrees with the treatment plan.  7:03 AM At shift change, care transferred to [facility], PA-C who will monitor blood pressure, reevaluate and determine disposition.   Final Clinical Impressions(s) / ED Diagnoses   Final diagnoses:  Generalized abdominal pain  Community acquired pneumonia of right lower lobe of lung [facility])    ED Discharge Orders    None       Mardene Sayer Boyd Kerbs 10/08/17 1610    Glynn Octave, MD 10/08/17 [id number]

## 2017-10-08 NOTE — ED Notes (Signed)
edp at bedside  

## 2017-10-08 NOTE — ED Notes (Signed)
Pt stable, ambulatory, and verbalizes understanding of d/c instructions.  

## 2017-10-08 NOTE — ED Notes (Signed)
Pt ambulated to restroom with steady gait. No dizziness or lightheadedness reported.

## 2017-10-08 NOTE — Discharge Instructions (Addendum)
Return here as needed. Follow up with your doctor. °

## 2017-10-13 LAB — CULTURE, BLOOD (ROUTINE X 2)
CULTURE: NO GROWTH
CULTURE: NO GROWTH

## 2019-06-07 ENCOUNTER — Emergency Department (HOSPITAL_COMMUNITY): Payer: Self-pay

## 2019-06-07 ENCOUNTER — Emergency Department (HOSPITAL_COMMUNITY)
Admission: EM | Admit: 2019-06-07 | Discharge: 2019-06-07 | Disposition: A | Discharge status: Home / Self Care | Payer: Self-pay | Attending: Emergency Medicine | Admitting: Emergency Medicine

## 2019-06-07 ENCOUNTER — Other Ambulatory Visit: Payer: Self-pay

## 2019-06-07 ENCOUNTER — Encounter (HOSPITAL_COMMUNITY): Payer: Self-pay | Admitting: *Deleted

## 2019-06-07 DIAGNOSIS — Z8709 Personal history of other diseases of the respiratory system: Secondary | ICD-10-CM | POA: Insufficient documentation

## 2019-06-07 DIAGNOSIS — F17210 Nicotine dependence, cigarettes, uncomplicated: Secondary | ICD-10-CM | POA: Insufficient documentation

## 2019-06-07 DIAGNOSIS — I10 Essential (primary) hypertension: Secondary | ICD-10-CM | POA: Insufficient documentation

## 2019-06-07 DIAGNOSIS — R42 Dizziness and giddiness: Secondary | ICD-10-CM | POA: Insufficient documentation

## 2019-06-07 DIAGNOSIS — Z79899 Other long term (current) drug therapy: Secondary | ICD-10-CM | POA: Insufficient documentation

## 2019-06-07 DIAGNOSIS — M545 Low back pain: Secondary | ICD-10-CM | POA: Insufficient documentation

## 2019-06-07 DIAGNOSIS — G8929 Other chronic pain: Secondary | ICD-10-CM

## 2019-06-07 DIAGNOSIS — F1290 Cannabis use, unspecified, uncomplicated: Secondary | ICD-10-CM | POA: Insufficient documentation

## 2019-06-07 DIAGNOSIS — R0789 Other chest pain: Secondary | ICD-10-CM | POA: Insufficient documentation

## 2019-06-07 LAB — CBC WITH DIFFERENTIAL/PLATELET
Abs Immature Granulocytes: 0.02 10*3/uL (ref 0.00–0.07)
Basophils Absolute: 0 10*3/uL (ref 0.0–0.1)
Basophils Relative: 0 %
Eosinophils Absolute: 0.2 10*3/uL (ref 0.0–0.5)
Eosinophils Relative: 2 %
HCT: 38.5 % (ref 36.0–46.0)
Hemoglobin: 12.4 g/dL (ref 12.0–15.0)
Immature Granulocytes: 0 %
Lymphocytes Relative: 38 %
Lymphs Abs: 2.9 10*3/uL (ref 0.7–4.0)
MCH: 30.5 pg (ref 26.0–34.0)
MCHC: 32.2 g/dL (ref 30.0–36.0)
MCV: 94.6 fL (ref 80.0–100.0)
Monocytes Absolute: 0.4 10*3/uL (ref 0.1–1.0)
Monocytes Relative: 5 %
Neutro Abs: 4.3 10*3/uL (ref 1.7–7.7)
Neutrophils Relative %: 55 %
Platelets: 242 10*3/uL (ref 150–400)
RBC: 4.07 MIL/uL (ref 3.87–5.11)
RDW: 13.5 % (ref 11.5–15.5)
WBC: 7.8 10*3/uL (ref 4.0–10.5)
nRBC: 0 % (ref 0.0–0.2)

## 2019-06-07 LAB — BASIC METABOLIC PANEL
Anion gap: 8 (ref 5–15)
BUN: 13 mg/dL (ref 6–20)
CO2: 27 mmol/L (ref 22–32)
Calcium: 8.6 mg/dL — ABNORMAL LOW (ref 8.9–10.3)
Chloride: 105 mmol/L (ref 98–111)
Creatinine, Ser: 0.71 mg/dL (ref 0.44–1.00)
GFR calc Af Amer: >60 mL/min (ref >60)
GFR calc non Af Amer: >60 mL/min (ref >60)
Glucose, Bld: 88 mg/dL (ref 70–99)
Potassium: 4.2 mmol/L (ref 3.5–5.1)
Sodium: 140 mmol/L (ref 135–145)

## 2019-06-07 LAB — TROPONIN I (HIGH SENSITIVITY)
Troponin I (High Sensitivity): 3 ng/L (ref <18)
Troponin I (High Sensitivity): <2 ng/L (ref <18)

## 2019-06-07 MED ORDER — MECLIZINE HCL 25 MG PO TABS
25.0000 mg | ORAL_TABLET | Freq: Once | ORAL | Status: AC
  Administered 2019-06-07: 20:00:00 25 mg via ORAL
  Filled 2019-06-07: qty 1

## 2019-06-07 MED ORDER — SODIUM CHLORIDE 0.9 % IV BOLUS
1000.0000 mL | Freq: Once | INTRAVENOUS | Status: AC
  Administered 2019-06-07: 16:00:00 1000 mL via INTRAVENOUS

## 2019-06-07 NOTE — ED Notes (Signed)
Patient ambulated well; no issues, gait steady

## 2019-06-07 NOTE — ED Provider Notes (Signed)
[facility] EMERGENCY DEPARTMENT Provider Note   CSN: [id number] Arrival date & time: 06/07/19  1437     History Chief Complaint  Patient presents with  . Chest Pain    Rachael Ramirez is a 48 y.o. female with past medical history of asthma, chronic low back pain, hypertension, substance abuse, presenting to the emergency department via EMS with multiple complaints.  Patient is complaining of chest tightness that began this morning radiating to bilateral shoulders.  No associated shortness of breath, nausea, diaphoresis.  She states this morning upon awakening she felt somewhat dizzy as well that was worse when she stood up.  She describes this as room spinning dizziness.  She did not have associated nausea, vision changes, or headache.  She also is complaining of worsening chronic low back pain since yesterday.  She treats her pain with Suboxone.  No new injuries or strenuous activity.  Denies new numbness or weakness in extremities, bowel or bladder incontinence, saddle paresthesia.  The history is provided by the patient.       Past Medical History:  Diagnosis Date  . Anxiety    takes Klonopin daily as needed  . Asthma    uses Combivent daily as needed  . Back pain   . Bipolar 1 disorder (HCC)    takes Lamictal daily  . CAP (community acquired pneumonia) 01/07/2015  . Chronic low back pain    HNP and Radiculopathy  . Collapsed lung 3/15   "while in hospital awaiting back OR"  . Cough   . GERD (gastroesophageal reflux disease)    takes Omeprazole daily  . Hypertension   . Insomnia    takes Trazodone nightly  . MRSA (methicillin resistant Staphylococcus aureus)    Postive nasal swab  . PONV (postoperative nausea and vomiting)   . Spinal headache   . Substance abuse (HCC)   . Urinary urgency   . Weakness    left leg    Patient Active Problem List   Diagnosis Date Noted  . Opioid type dependence, continuous (HCC) 07/02/2015  . Generalized anxiety  disorder 07/02/2015  . Tobacco abuse 01/07/2015  . Major depressive disorder, recurrent severe without psychotic features (HCC)   . Protein-calorie malnutrition, severe (HCC) 09/18/2013  . Acute respiratory failure with hypoxia (HCC) 09/18/2013  . History of suicide attempt 09/14/2013  . Compression fracture of L1 lumbar vertebra with delayed healing 09/14/2013  . Chronic low back pain 08/22/2013  . Bipolar disorder (HCC) 09/29/2012    Past Surgical History:  Procedure Laterality Date  . ANTERIOR CERVICAL DECOMP/DISCECTOMY FUSION  1996   "took bone off her hip"  . BACK SURGERY    . ELBOW FRACTURE SURGERY Left 1975  . ELBOW FRACTURE SURGERY Left 2002   "redo"  . EPIDURAL BLOCK INJECTION    . FRACTURE SURGERY    . HARDWARE REMOVAL  2008   "took screw out of lower back"  . LUMBAR DISC SURGERY  2008 X 2  . LUMBAR LAMINECTOMY/DECOMPRESSION MICRODISCECTOMY Left 07/13/2013   Procedure: Left Lumbar four-five microdiskectomy;  Surgeon: Carmela Hurt, MD;  Location: [facility];  Service: Neurosurgery;  Laterality: Left;  Left Lumbar four-five microdiskectomy  . LUMBAR WOUND DEBRIDEMENT N/A 09/21/2013   Procedure: LUMBAR WOUND re-exploration and disectomy;  Surgeon: Carmela Hurt, MD;  Location: [facility];  Service: Neurosurgery;  Laterality: N/A;  . PERCUTANEOUS PINNING Right 1992   "hand"  . POSTERIOR LUMBAR FUSION  2008  . TONSILLECTOMY  OB History   No obstetric history on file.     Family History  Problem Relation Age of Onset  . Multiple sclerosis Mother   . COPD Father   . Alcohol abuse Maternal Grandfather   . Alcohol abuse Brother   . Cirrhosis Brother     Social History   Tobacco Use  . Smoking status: Current Every Day Smoker    Packs/day: 0.50    Years: 18.00    Pack years: 9.00    Types: Cigarettes  . Smokeless tobacco: Never Used  Substance Use Topics  . Alcohol use: No  . Drug use: Yes    Types: Heroin, IV, Marijuana    Comment: pt states  heroin last use 02/13/16, last marijuana use  last week    Home Medications Prior to Admission medications   Medication Sig Start Date End Date Taking? Authorizing Provider  ANORO ELLIPTA 62.5-25 MCG/INH AEPB Inhale 1 puff into the lungs daily. 05/23/19  Yes [provider]  clonazePAM (KLONOPIN) 1 MG tablet Take 1 mg by mouth 2 (two) times daily.  12/04/15  Yes [provider]  omeprazole (PRILOSEC) 20 MG capsule Take 20 mg daily by mouth.   Yes [provider]  pregabalin (LYRICA) 300 MG capsule Take 1 capsule (300 mg total) by mouth 2 (two) times daily. Patient taking differently: Take 300 mg 3 (three) times daily by mouth.  09/11/15  Yes Adonis Brook, NP  PROAIR RESPICLICK 108 (90 Base) MCG/ACT AEPB Inhale 1-2 puffs into the lungs every 4 (four) hours as needed (For wheezing or shortness of breath.).  11/15/15  Yes [provider]  SUBOXONE 8-2 MG FILM Take 1 Film 2 (two) times daily by mouth. 04/21/17  Yes [provider]  traZODone (DESYREL) 100 MG tablet Take 100 mg at bedtime by mouth.   Yes [provider]  amoxicillin-clavulanate (AUGMENTIN) 875-125 MG tablet Take 1 tablet by mouth 2 (two) times daily. One po bid x 7 days Patient not taking: Reported on 06/07/2019 10/08/17   Muthersbaugh, Dahlia Client, PA-C  COMBIVENT RESPIMAT 20-100 MCG/ACT AERS respimat Inhale 2 puffs every 6 (six) hours as needed into the lungs. Patient not taking: Reported on 06/07/2019 05/08/17   Reymundo Poll, MD  fluticasone [facility]) 50 MCG/ACT nasal spray Place 2 sprays daily into both nostrils. Patient not taking: Reported on 06/07/2019 05/08/17   Reymundo Poll, MD  hydrOXYzine (ATARAX/VISTARIL) 25 MG tablet Take 1 tablet (25 mg total) by mouth 3 (three) times daily as needed. Patient not taking: Reported on 05/06/2017 03/15/16   Garlon Hatchet, PA-C  methocarbamol (ROBAXIN) 500 MG tablet Take 1 tablet (500 mg total) by mouth 2 (two) times daily. Patient  not taking: Reported on 05/06/2017 12/23/15   Derwood Kaplan, MD  naproxen (NAPROSYN) 500 MG tablet Take 1 tablet (500 mg total) by mouth 2 (two) times daily as needed for moderate pain (spasm). Patient not taking: Reported on 05/06/2017 12/23/15   Derwood Kaplan, MD  nortriptyline (PAMELOR) 25 MG capsule One capsule at night for 2 weeks, then take 2 capsules at night Patient not taking: Reported on 05/06/2017 12/25/15   York Spaniel, MD  predniSONE (DELTASONE) 20 MG tablet Take 2 tablets (40 mg total) daily with breakfast by mouth. Patient not taking: Reported on 06/07/2019 05/09/17   Reymundo Poll, MD  QUEtiapine (SEROQUEL) 200 MG tablet Take 1 tablet (200 mg total) by mouth at bedtime. Patient not taking: Reported on 06/07/2019 09/11/15   Adonis Brook, NP  Allergies    Minocycline and Tramadol  Review of Systems   Review of Systems  Cardiovascular: Positive for chest pain.  Musculoskeletal: Positive for back pain.  Neurological: Positive for dizziness.  All other systems reviewed and are negative.   Physical Exam Updated Vital Signs BP 106/67   Pulse (!) 56   Temp 98.1 F (36.7 C) (Oral)   Resp 12   Ht 5\' 2"  (1.575 m)   Wt 54 kg   SpO2 97%   BMI 21.77 kg/m   Physical Exam Vitals and nursing note reviewed.  Constitutional:      General: She is not in acute distress.    Appearance: She is well-developed. She is not ill-appearing.  HENT:     Head: Normocephalic and atraumatic.  Eyes:     Conjunctiva/sclera: Conjunctivae normal.     Comments: B/l pupillary dilation, reactive  Cardiovascular:     Rate and Rhythm: Normal rate and regular rhythm.  Pulmonary:     Effort: Pulmonary effort is normal. No respiratory distress.     Comments: Fine crackles b/l bases Abdominal:     General: Bowel sounds are normal.     Palpations: Abdomen is soft.     Tenderness: There is no abdominal tenderness.  Musculoskeletal:     Comments: Generalized TTP to lumbar back. No  skin changes.   Skin:    General: Skin is warm.  Neurological:     Mental Status: She is alert.     Comments: Spontaneously moving all extremities without difficulty. Mental Status:  Alert, oriented, thought content appropriate, able to give a coherent history. Speech fluent without evidence of aphasia. Able to follow 2 step commands without difficulty.  Cranial Nerves intact, no nystagmus. Motor:  Normal tone. 5/5 in upper and lower extremities bilaterally including strong and equal grip strength and dorsiflexion/plantar flexion Sensory: grossly normal in all extremities.  Cerebellar: normal finger-to-nose with bilateral upper extremities CV: distal pulses palpable throughout    Psychiatric:        Behavior: Behavior normal.     ED Results / Procedures / Treatments   Labs (all labs ordered are listed, but only abnormal results are displayed) Labs Reviewed  BASIC METABOLIC PANEL - Abnormal; Notable for the following components:      Result Value   Calcium 8.6 (*)    All other components within normal limits  CBC WITH DIFFERENTIAL/PLATELET  TROPONIN I (HIGH SENSITIVITY)  TROPONIN I (HIGH SENSITIVITY)    EKG EKG Interpretation  Date/Time:  Thursday June 07 2019 14:52:07 EST Ventricular Rate:  62 PR Interval:    QRS Duration: 98 QT Interval:  426 QTC Calculation: 433 R Axis:   66 Text Interpretation: Sinus rhythm Baseline wander Confirmed by Gerlene Fee [phone]) on 06/07/2019 3:16:53 PM   Radiology DG Chest 2 View  Result Date: 06/07/2019 CLINICAL DATA:  Chest pain, low back pain, worsened pain with movement, hypertension, smoker EXAM: CHEST - 2 VIEW COMPARISON:  05/06/2017 FINDINGS: Normal heart size, mediastinal contours, and pulmonary vascularity. Lungs clear. No pulmonary infiltrate, pleural effusion, or pneumothorax. Osseous structures unremarkable. IMPRESSION: No acute abnormalities. Electronically Signed   By: Lavonia Dana M.D.   On: 06/07/2019 16:29     Procedures Procedures (including critical care time)  Medications Ordered in ED Medications  sodium chloride 0.9 % bolus 1,000 mL (0 mLs Intravenous Stopped 06/07/19 1804)  meclizine (ANTIVERT) tablet 25 mg (25 mg Oral Given 06/07/19 2004)    ED Course  I have reviewed the  triage vital signs and the nursing notes.  Pertinent labs & imaging results that were available during my care of the patient were reviewed by me and considered in my medical decision making (see chart for details).    MDM Rules/Calculators/A&P                       Patient presenting to the ED with multiple complaints including chest tightness that began this morning, intermittent dizziness and worsening of her chronic low back pain.  Presentation is not concerning for cardiac or pulmonary etiology of symptoms.  Low heart score. Heart sounds normal.  Vital signs are stable, afebrile.  EKG is unchanged from previous.  Chest x-ray without acute findings.  Cardiac enzymes are normal, no electrolyte abnormalities.  CBC within normal limits.  No focal neuro deficits to suggest central cause of patient's dizziness.  She is treated in the ED with IV fluids and meclizine, ambulated well without difficulty.  On reevaluation patient states she is ready for discharge. Instructed to follow-up with PCP regarding visit today.  Strict return precautions discussed.  She is agreeable to plan and safe for discharge.  Patient discussed with and evaluated by Dr. Pilar PlateBero, who agrees with work-up and care plan for discharge.  Discussed results, findings, treatment and follow up. Patient advised of return precautions. Patient verbalized understanding and agreed with plan.  Final Clinical Impression(s) / ED Diagnoses Final diagnoses:  Atypical chest pain  Chronic bilateral low back pain, unspecified whether sciatica present  Dizziness    Rx / DC Orders ED Discharge Orders    None       Yuvan Medinger, [place] N, PA-C 06/07/19 2154     Sabas SousBero, Michael M, MD 06/08/19 [phone]

## 2019-06-07 NOTE — Discharge Instructions (Signed)
Please follow-up closely with your primary care provider regarding your visit today. Stay hydrated.  Return to the ER for new or worsening symptoms; including worsening chest pain, shortness of breath, pain that radiates to the arm or neck, pain or shortness of breath worsened with exertion.

## 2019-06-07 NOTE — ED Triage Notes (Signed)
Patient presents to ED via [place] c/o chest pain with radiation to both shoulders and into her back mainly lower back. States she has chronic back pain and has had 7 surgeries on her back and 1 on her neck. States pain is work with movement. C/o headache and dizziness. States she takes suboxide bid for chronic pain. Patient is alert oriented.

## 2019-10-08 ENCOUNTER — Ambulatory Visit (INDEPENDENT_AMBULATORY_CARE_PROVIDER_SITE_OTHER): Payer: Managed Care, Other (non HMO) | Admitting: Otolaryngology

## 2019-11-12 ENCOUNTER — Emergency Department (HOSPITAL_COMMUNITY): Payer: Managed Care, Other (non HMO)

## 2019-11-12 ENCOUNTER — Other Ambulatory Visit: Payer: Self-pay

## 2019-11-12 ENCOUNTER — Encounter (HOSPITAL_COMMUNITY): Payer: Self-pay

## 2019-11-12 ENCOUNTER — Emergency Department (HOSPITAL_COMMUNITY)
Admission: EM | Admit: 2019-11-12 | Discharge: 2019-11-12 | Disposition: A | Discharge status: Home / Self Care | Payer: Managed Care, Other (non HMO) | Attending: Emergency Medicine | Admitting: Emergency Medicine

## 2019-11-12 DIAGNOSIS — J45909 Unspecified asthma, uncomplicated: Secondary | ICD-10-CM | POA: Diagnosis not present

## 2019-11-12 DIAGNOSIS — R519 Headache, unspecified: Secondary | ICD-10-CM | POA: Insufficient documentation

## 2019-11-12 DIAGNOSIS — Z79899 Other long term (current) drug therapy: Secondary | ICD-10-CM | POA: Insufficient documentation

## 2019-11-12 DIAGNOSIS — F17210 Nicotine dependence, cigarettes, uncomplicated: Secondary | ICD-10-CM | POA: Diagnosis not present

## 2019-11-12 DIAGNOSIS — I10 Essential (primary) hypertension: Secondary | ICD-10-CM | POA: Diagnosis not present

## 2019-11-12 DIAGNOSIS — R531 Weakness: Secondary | ICD-10-CM | POA: Insufficient documentation

## 2019-11-12 DIAGNOSIS — R791 Abnormal coagulation profile: Secondary | ICD-10-CM | POA: Insufficient documentation

## 2019-11-12 LAB — COMPREHENSIVE METABOLIC PANEL
ALT: 9 U/L (ref 0–44)
AST: 16 U/L (ref 15–41)
Albumin: 3.6 g/dL (ref 3.5–5.0)
Alkaline Phosphatase: 87 U/L (ref 38–126)
Anion gap: 11 (ref 5–15)
BUN: 6 mg/dL (ref 6–20)
CO2: 23 mmol/L (ref 22–32)
Calcium: 9 mg/dL (ref 8.9–10.3)
Chloride: 106 mmol/L (ref 98–111)
Creatinine, Ser: 0.71 mg/dL (ref 0.44–1.00)
GFR calc Af Amer: >60 mL/min (ref >60)
GFR calc non Af Amer: >60 mL/min (ref >60)
Glucose, Bld: 102 mg/dL — ABNORMAL HIGH (ref 70–99)
Potassium: 3.9 mmol/L (ref 3.5–5.1)
Sodium: 140 mmol/L (ref 135–145)
Total Bilirubin: 0.2 mg/dL — ABNORMAL LOW (ref 0.3–1.2)
Total Protein: 6.8 g/dL (ref 6.5–8.1)

## 2019-11-12 LAB — DIFFERENTIAL
Abs Immature Granulocytes: 0.03 10*3/uL (ref 0.00–0.07)
Basophils Absolute: 0 10*3/uL (ref 0.0–0.1)
Basophils Relative: 0 %
Eosinophils Absolute: 0.1 10*3/uL (ref 0.0–0.5)
Eosinophils Relative: 1 %
Immature Granulocytes: 0 %
Lymphocytes Relative: 21 %
Lymphs Abs: 1.5 10*3/uL (ref 0.7–4.0)
Monocytes Absolute: 0.4 10*3/uL (ref 0.1–1.0)
Monocytes Relative: 6 %
Neutro Abs: 5.3 10*3/uL (ref 1.7–7.7)
Neutrophils Relative %: 72 %

## 2019-11-12 LAB — RAPID URINE DRUG SCREEN, HOSP PERFORMED
Amphetamines: NOT DETECTED
Barbiturates: NOT DETECTED
Benzodiazepines: NOT DETECTED
Cocaine: NOT DETECTED
Opiates: NOT DETECTED
Tetrahydrocannabinol: NOT DETECTED

## 2019-11-12 LAB — PROTIME-INR
INR: 0.9 (ref 0.8–1.2)
Prothrombin Time: 12 seconds (ref 11.4–15.2)

## 2019-11-12 LAB — CBC
HCT: 44.6 % (ref 36.0–46.0)
Hemoglobin: 14 g/dL (ref 12.0–15.0)
MCH: 30.4 pg (ref 26.0–34.0)
MCHC: 31.4 g/dL (ref 30.0–36.0)
MCV: 97 fL (ref 80.0–100.0)
Platelets: 255 10*3/uL (ref 150–400)
RBC: 4.6 MIL/uL (ref 3.87–5.11)
RDW: 13.3 % (ref 11.5–15.5)
WBC: 7.3 10*3/uL (ref 4.0–10.5)
nRBC: 0 % (ref 0.0–0.2)

## 2019-11-12 LAB — APTT: aPTT: 36 seconds (ref 24–36)

## 2019-11-12 LAB — I-STAT BETA HCG BLOOD, ED (MC, WL, AP ONLY): I-stat hCG, quantitative: <5 m[IU]/mL (ref <5)

## 2019-11-12 LAB — CBG MONITORING, ED
Glucose-Capillary: 91 mg/dL (ref 70–99)
Glucose-Capillary: 90 mg/dL (ref 70–99)

## 2019-11-12 MED ORDER — KETOROLAC TROMETHAMINE 30 MG/ML IJ SOLN
30.0000 mg | Freq: Once | INTRAMUSCULAR | Status: AC
  Administered 2019-11-12: 30 mg via INTRAVENOUS
  Filled 2019-11-12: qty 1

## 2019-11-12 MED ORDER — DIPHENHYDRAMINE HCL 50 MG/ML IJ SOLN
25.0000 mg | Freq: Once | INTRAMUSCULAR | Status: AC
  Administered 2019-11-12: 25 mg via INTRAVENOUS
  Filled 2019-11-12: qty 1

## 2019-11-12 MED ORDER — METOCLOPRAMIDE HCL 5 MG/ML IJ SOLN
10.0000 mg | Freq: Once | INTRAMUSCULAR | Status: AC
  Administered 2019-11-12: 10 mg via INTRAVENOUS
  Filled 2019-11-12: qty 2

## 2019-11-12 MED ORDER — SODIUM CHLORIDE 0.9% FLUSH
3.0000 mL | Freq: Once | INTRAVENOUS | Status: AC
Start: 2019-11-12 — End: 2019-11-12
  Administered 2019-11-12: 3 mL via INTRAVENOUS

## 2019-11-12 MED ORDER — SODIUM CHLORIDE 0.9 % IV BOLUS
1000.0000 mL | Freq: Once | INTRAVENOUS | Status: AC
  Administered 2019-11-12: 1000 mL via INTRAVENOUS

## 2019-11-12 NOTE — ED Provider Notes (Signed)
[facility] EMERGENCY DEPARTMENT Provider Note   CSN: [id number] Arrival date & time: 11/12/19  1040     History Chief Complaint  Patient presents with  . Headache  . Weakness    Rachael Ramirez is a 49 y.o. female with pertinent past medical history of bipolar 1, anxiety, hypertension, substance abuse that presents to the emergency department today via EMS for headache and weakness.  Patient states that her headache started last night, describes it as on the right side of her head, into her eye.  States that the headache has been getting worse, no sudden onset headache.  States that her headache is an 8/10.  Has not taken anything for it.  States that she also has some associated left eye blurriness along with left arm weakness and numbness.  States that this occurred at the same time as her headache.  Has never had a stroke before.  Denies any history of migraines.  Also admits to some dizziness and lightheadedness.  Denies any chest pain, shortness of breath, syncope, back pain, neck pain, abdominal pain, nausea, vomiting.  States that she was in normal health yesterday before this happened.  Denies any fevers or chills.  Denies any IV drug use, alcohol use.  Has never had anything like this happen her before.  HPI     Past Medical History:  Diagnosis Date  . Anxiety    takes Klonopin daily as needed  . Asthma    uses Combivent daily as needed  . Back pain   . Bipolar 1 disorder ([place])    takes Lamictal daily  . CAP (community acquired pneumonia) 01/07/2015  . Chronic low back pain    HNP and Radiculopathy  . Collapsed lung 3/15   "while in hospital awaiting back OR"  . Cough   . GERD (gastroesophageal reflux disease)    takes Omeprazole daily  . Hypertension   . Insomnia    takes Trazodone nightly  . MRSA (methicillin resistant Staphylococcus aureus)    Postive nasal swab  . PONV (postoperative nausea and vomiting)   . Spinal headache   . Substance abuse  ([place])   . Urinary urgency   . Weakness    left leg    Patient Active Problem List   Diagnosis Date Noted  . Opioid type dependence, continuous ([place]) 07/02/2015  . Generalized anxiety disorder 07/02/2015  . Tobacco abuse 01/07/2015  . Major depressive disorder, recurrent severe without psychotic features ([place])   . Protein-calorie malnutrition, severe ([place]) 09/18/2013  . Acute respiratory failure with hypoxia ([place]) 09/18/2013  . History of suicide attempt 09/14/2013  . Compression fracture of L1 lumbar vertebra with delayed healing 09/14/2013  . Chronic low back pain 08/22/2013  . Bipolar disorder ([place]) 09/29/2012    Past Surgical History:  Procedure Laterality Date  . ANTERIOR CERVICAL DECOMP/DISCECTOMY FUSION  1996   "took bone off her hip"  . BACK SURGERY    . ELBOW FRACTURE SURGERY Left 1975  . ELBOW FRACTURE SURGERY Left 2002   "redo"  . EPIDURAL BLOCK INJECTION    . FRACTURE SURGERY    . HARDWARE REMOVAL  2008   "took screw out of lower back"  . [place] SURGERY  2008 X 2  . LUMBAR LAMINECTOMY/DECOMPRESSION MICRODISCECTOMY Left 07/13/2013   Procedure: Left Lumbar four-five microdiskectomy;  Surgeon: Winfield Cunas, MD;  Location: [facility];  Service: Neurosurgery;  Laterality: Left;  Left Lumbar four-five microdiskectomy  . LUMBAR WOUND DEBRIDEMENT N/A  09/21/2013   Procedure: LUMBAR WOUND re-exploration and disectomy;  Surgeon: Carmela Hurt, MD;  Location: [facility];  Service: Neurosurgery;  Laterality: N/A;  . PERCUTANEOUS PINNING Right 1992   "hand"  . POSTERIOR LUMBAR FUSION  2008  . TONSILLECTOMY       OB History   No obstetric history on file.     Family History  Problem Relation Age of Onset  . Multiple sclerosis Mother   . COPD Father   . Alcohol abuse Maternal Grandfather   . Alcohol abuse Brother   . Cirrhosis Brother     Social History   Tobacco Use  . Smoking status: Current Every Day Smoker    Packs/day: 0.50    Years: 18.00     Pack years: 9.00    Types: Cigarettes  . Smokeless tobacco: Never Used  Substance Use Topics  . Alcohol use: No  . Drug use: Yes    Types: Heroin, IV, Marijuana    Comment: pt states heroin last use 02/13/16, last marijuana use  last week    Home Medications Prior to Admission medications   Medication Sig Start Date End Date Taking? Authorizing Provider  amoxicillin-clavulanate (AUGMENTIN) 875-125 MG tablet Take 1 tablet by mouth 2 (two) times daily. One po bid x 7 days Patient not taking: Reported on 06/07/2019 10/08/17   Muthersbaugh, Dahlia Client, PA-C  ANORO ELLIPTA 62.5-25 MCG/INH AEPB Inhale 1 puff into the lungs daily. 05/23/19   [provider]  clonazePAM (KLONOPIN) 1 MG tablet Take 1 mg by mouth 2 (two) times daily.  12/04/15   [provider]  COMBIVENT RESPIMAT 20-100 MCG/ACT AERS respimat Inhale 2 puffs every 6 (six) hours as needed into the lungs. Patient not taking: Reported on 06/07/2019 05/08/17   Reymundo Poll, MD  fluticasone [facility]) 50 MCG/ACT nasal spray Place 2 sprays daily into both nostrils. Patient not taking: Reported on 06/07/2019 05/08/17   Reymundo Poll, MD  hydrOXYzine (ATARAX/VISTARIL) 25 MG tablet Take 1 tablet (25 mg total) by mouth 3 (three) times daily as needed. Patient not taking: Reported on 05/06/2017 03/15/16   Garlon Hatchet, PA-C  methocarbamol (ROBAXIN) 500 MG tablet Take 1 tablet (500 mg total) by mouth 2 (two) times daily. Patient not taking: Reported on 05/06/2017 12/23/15   Derwood Kaplan, MD  naproxen (NAPROSYN) 500 MG tablet Take 1 tablet (500 mg total) by mouth 2 (two) times daily as needed for moderate pain (spasm). Patient not taking: Reported on 05/06/2017 12/23/15   Derwood Kaplan, MD  nortriptyline (PAMELOR) 25 MG capsule One capsule at night for 2 weeks, then take 2 capsules at night Patient not taking: Reported on 05/06/2017 12/25/15   York Spaniel, MD  omeprazole (PRILOSEC) 20 MG capsule Take 20 mg daily by  mouth.    [provider]  predniSONE (DELTASONE) 20 MG tablet Take 2 tablets (40 mg total) daily with breakfast by mouth. Patient not taking: Reported on 06/07/2019 05/09/17   Reymundo Poll, MD  pregabalin (LYRICA) 300 MG capsule Take 1 capsule (300 mg total) by mouth 2 (two) times daily. Patient taking differently: Take 300 mg 3 (three) times daily by mouth.  09/11/15   Adonis Brook, NP  PROAIR RESPICLICK 108 (90 Base) MCG/ACT AEPB Inhale 1-2 puffs into the lungs every 4 (four) hours as needed (For wheezing or shortness of breath.).  11/15/15   [provider]  QUEtiapine (SEROQUEL) 200 MG tablet Take 1 tablet (200 mg total) by mouth at bedtime. Patient  not taking: Reported on 06/07/2019 09/11/15   Adonis BrookAgustin, Sheila, NP  SUBOXONE 8-2 MG FILM Take 1 Film 2 (two) times daily by mouth. 04/21/17   [provider]  traZODone (DESYREL) 100 MG tablet Take 100 mg at bedtime by mouth.    [provider]    Allergies    Minocycline and Tramadol  Review of Systems   Review of Systems  Constitutional: Negative for chills, diaphoresis, fatigue and fever.  HENT: Negative for congestion, sore throat and trouble swallowing.   Eyes: Positive for visual disturbance (L). Negative for pain.  Respiratory: Negative for cough, shortness of breath and wheezing.   Cardiovascular: Negative for chest pain, palpitations and leg swelling.  Gastrointestinal: Negative for abdominal distention, abdominal pain, diarrhea, nausea and vomiting.  Genitourinary: Negative for difficulty urinating.  Musculoskeletal: Negative for back pain, neck pain and neck stiffness.  Skin: Negative for pallor.  Neurological: Positive for dizziness, weakness (L arm), light-headedness, numbness (L arm) and headaches (R sided ). Negative for tremors, seizures, syncope, facial asymmetry and speech difficulty.  Psychiatric/Behavioral: Negative for confusion.    Physical Exam Updated Vital Signs BP  113/70   Pulse (!) 49   Temp 98.1 F (36.7 C) (Oral)   Resp 15   Ht 5\' 2"  (1.575 m)   Wt 56.2 kg   SpO2 96%   BMI 22.68 kg/m   Physical Exam Constitutional:      General: She is not in acute distress.    Appearance: Normal appearance. She is not ill-appearing, toxic-appearing or diaphoretic.  HENT:     Head: Normocephalic and atraumatic.     Mouth/Throat:     Mouth: Mucous membranes are moist.     Pharynx: Oropharynx is clear.  Eyes:     General: Visual field deficit (Peripheral vision not intact on left eye. Nomral right eye. ) present. No scleral icterus.    Extraocular Movements: Extraocular movements intact.     Right eye: Normal extraocular motion and no nystagmus.     Left eye: Normal extraocular motion and no nystagmus.     Pupils: Pupils are equal, round, and reactive to light.     Right eye: Pupil is reactive.     Left eye: Pupil is reactive.     Comments: Constricted  Neck:     Meningeal: Brudzinski's sign and Kernig's sign absent.  Cardiovascular:     Rate and Rhythm: Normal rate and regular rhythm.     Pulses: Normal pulses.     Heart sounds: Normal heart sounds.  Pulmonary:     Effort: Pulmonary effort is normal. No respiratory distress.     Breath sounds: Normal breath sounds. No stridor. No wheezing, rhonchi or rales.  Chest:     Chest wall: No tenderness.  Abdominal:     General: Abdomen is flat. There is no distension.     Palpations: Abdomen is soft.     Tenderness: There is no abdominal tenderness. There is no guarding or rebound.  Musculoskeletal:        General: No swelling or tenderness. Normal range of motion.     Cervical back: Normal range of motion and neck supple. No rigidity.     Right lower leg: No edema.     Left lower leg: No edema.  Lymphadenopathy:     Cervical: No cervical adenopathy.  Skin:    General: Skin is warm and dry.     Capillary Refill: Capillary refill takes less than 2 seconds.  Coloration: Skin is not pale.    Neurological:     General: No focal deficit present.     Mental Status: She is alert and oriented to person, place, and time.     Cranial Nerves: No cranial nerve deficit, dysarthria or facial asymmetry.     Sensory: Sensory deficit (Pt states that she feels differently on left upper extermity compared to right upper. No abnomralities on lowew extremities. ) present.     Motor: Weakness (4/5 stregth on left upper extremity compared to right 5/5. However pt was able to use full stregth on both sides to lift herself and slide her pants off for examination) present. No tremor, abnormal muscle tone, seizure activity or pronator drift.     Coordination: Romberg sign negative. Coordination normal. Finger-Nose-Finger Test normal. Rapid alternating movements normal.     Gait: Gait is intact.     Deep Tendon Reflexes:     Reflex Scores:      Brachioradialis reflexes are 2+ on the right side and 2+ on the left side.      Patellar reflexes are 2+ on the right side and 2+ on the left side.      Achilles reflexes are 2+ on the right side and 2+ on the left side. Psychiatric:        Mood and Affect: Affect is flat.     ED Results / Procedures / Treatments   Labs (all labs ordered are listed, but only abnormal results are displayed) Labs Reviewed  COMPREHENSIVE METABOLIC PANEL - Abnormal; Notable for the following components:      Result Value   Glucose, Bld 102 (*)    Total Bilirubin 0.2 (*)    All other components within normal limits  PROTIME-INR  APTT  CBC  DIFFERENTIAL  RAPID URINE DRUG SCREEN, HOSP PERFORMED  CBG MONITORING, ED  I-STAT BETA HCG BLOOD, ED ([facility], [facility], [facility] ONLY)  CBG MONITORING, ED    EKG EKG Interpretation  Date/Time:  Monday Nov 12 2019 10:51:21 EDT Ventricular Rate:  84 PR Interval:  156 QRS Duration: 80 QT Interval:  368 QTC Calculation: 434 R Axis:   67 Text Interpretation: Normal sinus rhythm Cannot rule out Anterior infarct , age undetermined Abnormal ECG No  significant change since prior 12/20 Confirmed by Meridee Score [phone]) on 11/12/2019 1:45:13 PM   Radiology CT HEAD WO CONTRAST  Result Date: 11/12/2019 CLINICAL DATA:  Headache and generalized weakness since yesterday. EXAM: CT HEAD WITHOUT CONTRAST TECHNIQUE: Contiguous axial images were obtained from the base of the skull through the vertex without intravenous contrast. COMPARISON:  12/24/2015 FINDINGS: Brain: The ventricles are normal in size and configuration. No extra-axial fluid collections are identified. The gray-white differentiation is maintained. No CT findings for acute hemispheric infarction or intracranial hemorrhage. No mass lesions. The brainstem and cerebellum are normal. Vascular: No hyperdense vessels or obvious aneurysm. Skull: No acute skull fracture. No bone lesion. Sinuses/Orbits: The paranasal sinuses and mastoid air cells are clear. The globes are intact. Other: No scalp lesions, laceration or hematoma. IMPRESSION: Normal head CT. Electronically Signed   By: Rudie Meyer M.D.   On: 11/12/2019 12:02   MR Brain Wo Contrast (neuro protocol)  Result Date: 11/12/2019 CLINICAL DATA:  49 year old female with headache and left side weakness since yesterday. EXAM: MRI HEAD WITHOUT CONTRAST TECHNIQUE: Multiplanar, multiecho pulse sequences of the brain and surrounding structures were obtained without intravenous contrast. COMPARISON:  Head CT earlier today, and earlier. FINDINGS: Brain: No restricted diffusion  to suggest acute infarction. No midline shift, mass effect, evidence of mass lesion, ventriculomegaly, extra-axial collection or acute intracranial hemorrhage. Cervicomedullary junction and pituitary are within normal limits. Wallace Cullens and white matter signal is within normal limits for age throughout the brain. No convincing encephalomalacia or chronic cerebral blood products. Vascular: Major intracranial vascular flow voids are preserved. The left vertebral artery appears dominant.  Skull and upper cervical spine: Partially visible cervical spine disc and endplate degeneration. Up to mild degenerative cervical spinal stenosis at C4-C5. Visualized bone marrow signal is within normal limits. Sinuses/Orbits: Negative orbits. Paranasal Visualized paranasal sinuses and mastoids are stable and well pneumatized. Other: Visible internal auditory structures appear normal. Normal stylomastoid foramina. Scalp and face soft tissues appear within normal limits. IMPRESSION: 1. No acute intracranial abnormality, normal for age noncontrast MRI appearance of the brain. 2. Partially visible cervical spine degeneration with mild degenerative spinal stenosis suspected at C4-C5. Electronically Signed   By: Odessa Fleming M.D.   On: 11/12/2019 19:28    Procedures Procedures (including critical care time)  Medications Ordered in ED Medications  sodium chloride flush (NS) 0.9 % injection 3 mL (3 mLs Intravenous Given 11/12/19 1528)  metoCLOPramide (REGLAN) injection 10 mg (10 mg Intravenous Given 11/12/19 1522)  diphenhydrAMINE (BENADRYL) injection 25 mg (25 mg Intravenous Given 11/12/19 1522)  ketorolac (TORADOL) 30 MG/ML injection 30 mg (30 mg Intravenous Given 11/12/19 1522)  sodium chloride 0.9 % bolus 1,000 mL (0 mLs Intravenous Stopped 11/12/19 2041)    ED Course  I have reviewed the triage vital signs and the nursing notes.  Pertinent labs & imaging results that were available during my care of the patient were reviewed by me and considered in my medical decision making (see chart for details).  Clinical Course as of Nov 12 949  Mon Nov 12, 2019  1404 Alkaline Phosphatase: 105 [SP]  6067 49 year old female here with complaint of headache, left arm weakness and numbness, confusion that started yesterday.  When I asked her what she thought started this, she states she has been under a lot of stress.  She is a very effort dependent exam.  Initial CT negative.  Will review with neurology for  recommendations.   [MB]    Clinical Course User Index [MB] Terrilee Files, MD [SP] Farrel Gordon, PA-C   MDM Rules/Calculators/A&P                     Tamiki Kuba is a 49 y.o. female with pertinent past medical history of bipolar 1, anxiety, hypertension, substance abuse that presents to the emergency department today via EMS for  headache and weakness. On PE isnt giving full effort on exam. L sided weakness and sensation changes in upper extremity and left sided blurry vision along with R sided HA.   Emergent considerations for headache include subarachnoid hemorrhage, meningitis, temporal arteritis, glaucoma, cerebral ischemia, carotid/vertebral dissection, intracranial tumor, Venous sinus thrombosis, carbon monoxide poisoning, acute or chronic subdural hemorrhage.  Other considerations include: Migraine, Cluster headache, Hypertension, Caffeine, alcohol, or drug withdrawal, Pseudotumor cerebri, Arteriovenous malformation, Head injury, Neurocysticercosis, Post-lumbar puncture, Preeclampsia, Tension headache, Sinusitis, Cervical arthritis, Refractive error causing strain, Dental abscess, Otitis media, Temporomandibular joint syndrome, Depression, Somatoform disorder (eg, somatization) Trigeminal neuralgia, Glossopharyngeal neuralgia.  Initial interventions will scan brain before giving HA medication.   ECG interpreted by me demonstrated sinus rhythm. CT head with no intracranial abnormality. Labs demonstrated normal CBC, CMP, normal coagulation studies, negative pregnancy test.  .9:51 AM spoke to  Dr. Jerrell Belfast who recommended MRI of the brain and migraine cocktail and discharge if negative.   MRI brain was negative. Upon reassessment patient is much better.  States that pain is 0/10. Ready for discharge. Was able to get out of bed using both arms, no weakness noted.   Given the above findings, my suspicion is that Pt had an atypical migraine. Pain is now 0/10. Was able to walk around the  hospital without difficulty. Do not think LP is needed at this time, pt is afebrile and has no meningeal signs. Do not think SAH is occurring, pt did not have immediate onset HA. Will have pt follow up with outpatient neurology.   .Doubt need for further emergent work up at this time. I explained the diagnosis and have given explicit precautions to return to the ER including for any other new or worsening symptoms. The patient understands and accepts the medical plan as it's been dictated and I have answered their questions. Discharge instructions concerning home care and prescriptions have been given. The patient is STABLE and is discharged to home in good condition.   The plan for this patient was discussed with Dr. Charm Barges, who voiced agreement and who oversaw evaluation and treatment of this patient.  Final Clinical Impression(s) / ED Diagnoses Final diagnoses:  Generalized weakness  Acute intractable headache, unspecified headache type    Rx / DC Orders ED Discharge Orders    None       Farrel Gordon, PA-C 11/13/19 [id number]    Terrilee Files, MD 11/13/19 1335

## 2019-11-12 NOTE — ED Triage Notes (Signed)
Pt from home with ems for headache that started yesterday and generalized weakness. Upon exam during triage significant weakness noted to left arm compared to right, unknown LSN. Pupils pinpoint. No slurred speech or facial droop noted. Pt a.o. 20G LAC

## 2019-11-12 NOTE — Discharge Instructions (Addendum)
You were seen today for headache and some weakness.  All of your blood work and CT scan and MRI are reassuring.  I want you to follow-up with outpatient neurology.  Use a migraine headache guide attached.  You could be having migraines.  Get plenty of rest and drink plenty of water.  Come back to the emergency room if you have any worsening symptoms.  I want you to follow-up with your PCP as soon as possible.  Use Tylenol as directed on the bottle for pain.

## 2019-12-19 ENCOUNTER — Other Ambulatory Visit: Payer: Self-pay

## 2019-12-19 ENCOUNTER — Emergency Department (HOSPITAL_COMMUNITY)
Admission: EM | Admit: 2019-12-19 | Discharge: 2019-12-20 | Disposition: A | Discharge status: Home / Self Care | Payer: Managed Care, Other (non HMO) | Attending: Emergency Medicine | Admitting: Emergency Medicine

## 2019-12-19 ENCOUNTER — Encounter (HOSPITAL_COMMUNITY): Payer: Self-pay | Admitting: Emergency Medicine

## 2019-12-19 DIAGNOSIS — Z5321 Procedure and treatment not carried out due to patient leaving prior to being seen by health care provider: Secondary | ICD-10-CM | POA: Diagnosis not present

## 2019-12-19 DIAGNOSIS — R102 Pelvic and perineal pain: Secondary | ICD-10-CM | POA: Diagnosis not present
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
## 2019-12-20 NOTE — ED Notes (Signed)
Pt called x3 for vital recheck no answer 

## 2020-01-15 ENCOUNTER — Emergency Department (HOSPITAL_COMMUNITY): Payer: Managed Care, Other (non HMO)

## 2020-01-15 ENCOUNTER — Inpatient Hospital Stay (HOSPITAL_COMMUNITY)
Admission: EM | Admit: 2020-01-15 | Discharge: 2020-01-16 | DRG: 189 | Discharge status: Against Medical Advice | Payer: Managed Care, Other (non HMO) | Attending: Internal Medicine | Admitting: Internal Medicine

## 2020-01-15 DIAGNOSIS — Z5329 Procedure and treatment not carried out because of patient's decision for other reasons: Secondary | ICD-10-CM | POA: Diagnosis not present

## 2020-01-15 DIAGNOSIS — F332 Major depressive disorder, recurrent severe without psychotic features: Secondary | ICD-10-CM

## 2020-01-15 DIAGNOSIS — Z5321 Procedure and treatment not carried out due to patient leaving prior to being seen by health care provider: Secondary | ICD-10-CM | POA: Diagnosis not present

## 2020-01-15 DIAGNOSIS — F1910 Other psychoactive substance abuse, uncomplicated: Secondary | ICD-10-CM | POA: Diagnosis present

## 2020-01-15 DIAGNOSIS — Z7952 Long term (current) use of systemic steroids: Secondary | ICD-10-CM | POA: Diagnosis not present

## 2020-01-15 DIAGNOSIS — Z820 Family history of epilepsy and other diseases of the nervous system: Secondary | ICD-10-CM

## 2020-01-15 DIAGNOSIS — Z791 Long term (current) use of non-steroidal anti-inflammatories (NSAID): Secondary | ICD-10-CM

## 2020-01-15 DIAGNOSIS — R0989 Other specified symptoms and signs involving the circulatory and respiratory systems: Secondary | ICD-10-CM | POA: Diagnosis present

## 2020-01-15 DIAGNOSIS — K219 Gastro-esophageal reflux disease without esophagitis: Secondary | ICD-10-CM | POA: Diagnosis present

## 2020-01-15 DIAGNOSIS — J189 Pneumonia, unspecified organism: Secondary | ICD-10-CM

## 2020-01-15 DIAGNOSIS — R197 Diarrhea, unspecified: Secondary | ICD-10-CM | POA: Diagnosis present

## 2020-01-15 DIAGNOSIS — F319 Bipolar disorder, unspecified: Secondary | ICD-10-CM | POA: Diagnosis present

## 2020-01-15 DIAGNOSIS — J45909 Unspecified asthma, uncomplicated: Secondary | ICD-10-CM | POA: Diagnosis present

## 2020-01-15 DIAGNOSIS — Z825 Family history of asthma and other chronic lower respiratory diseases: Secondary | ICD-10-CM

## 2020-01-15 DIAGNOSIS — Z79899 Other long term (current) drug therapy: Secondary | ICD-10-CM

## 2020-01-15 DIAGNOSIS — F17210 Nicotine dependence, cigarettes, uncomplicated: Secondary | ICD-10-CM | POA: Diagnosis present

## 2020-01-15 DIAGNOSIS — Z888 Allergy status to other drugs, medicaments and biological substances status: Secondary | ICD-10-CM

## 2020-01-15 DIAGNOSIS — Z811 Family history of alcohol abuse and dependence: Secondary | ICD-10-CM | POA: Diagnosis not present

## 2020-01-15 DIAGNOSIS — Z20822 Contact with and (suspected) exposure to covid-19: Secondary | ICD-10-CM | POA: Diagnosis present

## 2020-01-15 DIAGNOSIS — R001 Bradycardia, unspecified: Secondary | ICD-10-CM | POA: Diagnosis present

## 2020-01-15 DIAGNOSIS — Z7951 Long term (current) use of inhaled steroids: Secondary | ICD-10-CM | POA: Diagnosis not present

## 2020-01-15 DIAGNOSIS — F411 Generalized anxiety disorder: Secondary | ICD-10-CM | POA: Diagnosis present

## 2020-01-15 DIAGNOSIS — I959 Hypotension, unspecified: Secondary | ICD-10-CM | POA: Diagnosis present

## 2020-01-15 DIAGNOSIS — R0602 Shortness of breath: Secondary | ICD-10-CM | POA: Diagnosis present

## 2020-01-15 DIAGNOSIS — J9601 Acute respiratory failure with hypoxia: Secondary | ICD-10-CM | POA: Diagnosis not present

## 2020-01-15 DIAGNOSIS — G8929 Other chronic pain: Secondary | ICD-10-CM | POA: Diagnosis present

## 2020-01-15 DIAGNOSIS — G9341 Metabolic encephalopathy: Secondary | ICD-10-CM

## 2020-01-15 DIAGNOSIS — I10 Essential (primary) hypertension: Secondary | ICD-10-CM | POA: Diagnosis present

## 2020-01-15 LAB — URINALYSIS, ROUTINE W REFLEX MICROSCOPIC
Bilirubin Urine: NEGATIVE
Glucose, UA: NEGATIVE mg/dL
Hgb urine dipstick: NEGATIVE
Ketones, ur: NEGATIVE mg/dL
Leukocytes,Ua: NEGATIVE
Nitrite: NEGATIVE
Protein, ur: NEGATIVE mg/dL
Specific Gravity, Urine: 1.003 — ABNORMAL LOW (ref 1.005–1.030)
pH: 7 (ref 5.0–8.0)

## 2020-01-15 LAB — RAPID URINE DRUG SCREEN, HOSP PERFORMED
Amphetamines: NOT DETECTED
Barbiturates: NOT DETECTED
Benzodiazepines: NOT DETECTED
Cocaine: NOT DETECTED
Opiates: NOT DETECTED
Tetrahydrocannabinol: NOT DETECTED

## 2020-01-15 LAB — I-STAT VENOUS BLOOD GAS, ED
Acid-Base Excess: 4 mmol/L — ABNORMAL HIGH (ref 0.0–2.0)
Bicarbonate: 30.6 mmol/L — ABNORMAL HIGH (ref 20.0–28.0)
Calcium, Ion: 1.12 mmol/L — ABNORMAL LOW (ref 1.15–1.40)
HCT: 43 % (ref 36.0–46.0)
Hemoglobin: 14.6 g/dL (ref 12.0–15.0)
O2 Saturation: 37 %
Potassium: 4 mmol/L (ref 3.5–5.1)
Sodium: 142 mmol/L (ref 135–145)
TCO2: 32 mmol/L (ref 22–32)
pCO2, Ven: 51.8 mmHg (ref 44.0–60.0)
pH, Ven: 7.379 (ref 7.250–7.430)
pO2, Ven: 23 mmHg — CL (ref 32.0–45.0)

## 2020-01-15 LAB — CBC WITH DIFFERENTIAL/PLATELET
Abs Immature Granulocytes: 0.01 10*3/uL (ref 0.00–0.07)
Basophils Absolute: 0 10*3/uL (ref 0.0–0.1)
Basophils Relative: 0 %
Eosinophils Absolute: 0.2 10*3/uL (ref 0.0–0.5)
Eosinophils Relative: 3 %
HCT: 44.8 % (ref 36.0–46.0)
Hemoglobin: 13.9 g/dL (ref 12.0–15.0)
Immature Granulocytes: 0 %
Lymphocytes Relative: 38 %
Lymphs Abs: 2.7 10*3/uL (ref 0.7–4.0)
MCH: 30 pg (ref 26.0–34.0)
MCHC: 31 g/dL (ref 30.0–36.0)
MCV: 96.6 fL (ref 80.0–100.0)
Monocytes Absolute: 0.5 10*3/uL (ref 0.1–1.0)
Monocytes Relative: 7 %
Neutro Abs: 3.7 10*3/uL (ref 1.7–7.7)
Neutrophils Relative %: 52 %
Platelets: 263 10*3/uL (ref 150–400)
RBC: 4.64 MIL/uL (ref 3.87–5.11)
RDW: 13.2 % (ref 11.5–15.5)
WBC: 7.1 10*3/uL (ref 4.0–10.5)
nRBC: 0 % (ref 0.0–0.2)

## 2020-01-15 LAB — BASIC METABOLIC PANEL
Anion gap: 8 (ref 5–15)
BUN: 9 mg/dL (ref 6–20)
CO2: 26 mmol/L (ref 22–32)
Calcium: 8.9 mg/dL (ref 8.9–10.3)
Chloride: 107 mmol/L (ref 98–111)
Creatinine, Ser: 0.75 mg/dL (ref 0.44–1.00)
GFR calc Af Amer: >60 mL/min (ref >60)
GFR calc non Af Amer: >60 mL/min (ref >60)
Glucose, Bld: 86 mg/dL (ref 70–99)
Potassium: 4.6 mmol/L (ref 3.5–5.1)
Sodium: 141 mmol/L (ref 135–145)

## 2020-01-15 LAB — SARS CORONAVIRUS 2 BY RT PCR (HOSPITAL ORDER, PERFORMED IN ~~LOC~~ HOSPITAL LAB): SARS Coronavirus 2: NEGATIVE

## 2020-01-15 MED ORDER — SODIUM CHLORIDE 0.9 % IV SOLN
500.0000 mg | Freq: Once | INTRAVENOUS | Status: AC
  Administered 2020-01-15: 500 mg via INTRAVENOUS
  Filled 2020-01-15: qty 500

## 2020-01-15 MED ORDER — SODIUM CHLORIDE 0.9 % IV SOLN
1.0000 g | Freq: Once | INTRAVENOUS | Status: AC
  Administered 2020-01-15: 1 g via INTRAVENOUS
  Filled 2020-01-15: qty 10

## 2020-01-15 MED ORDER — IPRATROPIUM BROMIDE HFA 17 MCG/ACT IN AERS
2.0000 | INHALATION_SPRAY | Freq: Once | RESPIRATORY_TRACT | Status: AC
  Administered 2020-01-15: 2 via RESPIRATORY_TRACT
  Filled 2020-01-15: qty 12.9

## 2020-01-15 MED ORDER — BUPRENORPHINE HCL 8 MG SL SUBL: 8.0000 mg | SUBLINGUAL_TABLET | Freq: Two times a day (BID) | SUBLINGUAL | Status: DC

## 2020-01-15 MED ORDER — PREGABALIN 75 MG PO CAPS
300.0000 mg | ORAL_CAPSULE | Freq: Three times a day (TID) | ORAL | Status: DC
  Administered 2020-01-16: 300 mg via ORAL
  Filled 2020-01-15: qty 4

## 2020-01-15 MED ORDER — METHYLPREDNISOLONE SODIUM SUCC 125 MG IJ SOLR
125.0000 mg | Freq: Once | INTRAMUSCULAR | Status: AC
  Administered 2020-01-15: 125 mg via INTRAVENOUS
  Filled 2020-01-15: qty 2

## 2020-01-15 MED ORDER — IBUPROFEN 200 MG PO TABS
200.0000 mg | ORAL_TABLET | Freq: Four times a day (QID) | ORAL | Status: DC | PRN
  Administered 2020-01-16: 400 mg via ORAL
  Filled 2020-01-15: qty 2

## 2020-01-15 MED ORDER — ONDANSETRON HCL 4 MG/2ML IJ SOLN
4.0000 mg | Freq: Once | INTRAMUSCULAR | Status: AC
  Administered 2020-01-15: 4 mg via INTRAVENOUS
  Filled 2020-01-15: qty 2

## 2020-01-15 MED ORDER — ENOXAPARIN SODIUM 40 MG/0.4ML ~~LOC~~ SOLN: 40.0000 mg | SUBCUTANEOUS | Status: DC

## 2020-01-15 MED ORDER — ALBUTEROL SULFATE (2.5 MG/3ML) 0.083% IN NEBU: 3.0000 mL | INHALATION_SOLUTION | RESPIRATORY_TRACT | Status: DC | PRN

## 2020-01-15 MED ORDER — PANTOPRAZOLE SODIUM 40 MG PO TBEC: 40.0000 mg | DELAYED_RELEASE_TABLET | Freq: Every day | ORAL | Status: DC

## 2020-01-15 MED ORDER — SODIUM CHLORIDE 0.9 % IV SOLN
500.0000 mg | INTRAVENOUS | Status: DC
  Filled 2020-01-15: qty 500

## 2020-01-15 MED ORDER — SODIUM CHLORIDE 0.9 % IV SOLN
2.0000 g | INTRAVENOUS | Status: DC
  Filled 2020-01-15: qty 20

## 2020-01-15 MED ORDER — SODIUM CHLORIDE 0.9 % IV BOLUS
1000.0000 mL | Freq: Once | INTRAVENOUS | Status: AC
  Administered 2020-01-15: 1000 mL via INTRAVENOUS

## 2020-01-15 MED ORDER — IOHEXOL 350 MG/ML SOLN
100.0000 mL | Freq: Once | INTRAVENOUS | Status: AC | PRN
  Administered 2020-01-15: 75 mL via INTRAVENOUS

## 2020-01-15 MED ORDER — ALBUTEROL SULFATE HFA 108 (90 BASE) MCG/ACT IN AERS
2.0000 | INHALATION_SPRAY | Freq: Once | RESPIRATORY_TRACT | Status: AC
  Administered 2020-01-15: 2 via RESPIRATORY_TRACT
  Filled 2020-01-15: qty 6.7

## 2020-01-15 MED ORDER — SODIUM CHLORIDE 0.9 % IV SOLN: INTRAVENOUS | Status: DC

## 2020-01-15 MED ORDER — CLONAZEPAM 1 MG PO TABS
1.0000 mg | ORAL_TABLET | Freq: Two times a day (BID) | ORAL | Status: DC | PRN
  Administered 2020-01-16: 1 mg via ORAL
  Filled 2020-01-15: qty 1

## 2020-01-15 MED ORDER — CYCLOBENZAPRINE HCL 10 MG PO TABS: 10.0000 mg | ORAL_TABLET | Freq: Three times a day (TID) | ORAL | Status: DC | PRN

## 2020-01-15 NOTE — ED Notes (Signed)
Pt ambulated halfway around stretcher bed within room. C/O dizziness with walking. This NT then instructed the pt to lay back down in bed in order to prevent a potential fall from dizziness. O2sat remained in the mid to high 90s during ambulation.

## 2020-01-15 NOTE — ED Triage Notes (Signed)
Came in via EMS c/o of worsening cough x [redacted] week along w/ weakness and shortness of breath. Per EMS; patient is difficult to stay arouse and Sp02 dropped to 80s. Patient stated she is on Lyrica, Clonipin and Trazodone and endorsed hx of chronic neck pain.

## 2020-01-15 NOTE — ED Provider Notes (Signed)
[facility] EMERGENCY DEPARTMENT Provider Note   CSN: [id number] Arrival date & time: 01/15/20  1421    History SOB  Rachael Ramirez is a 49 y.o. female with past medical history significant for anxiety, asthma, bipolar,, chronic back pain, hypertension who presents for evaluation of cough and SOB. Did not receive COVID vaccine. States he has been using her inhaler without relief.  Uses Combivent daily however is unsure if she has a rescue inhaler.  Cough productive of yellow sputum.  Denies headache, lightheadedness, dizziness, chest pain, shortness of breath, dysuria, rashes or lesions.  She denies any polysubstance use.  She does take trazodone and Klonopin daily.  On EMS arrival patient was sleepy.  Had oxygen saturations into the 80s.  Not use home oxygen.  States she has some chronic neck pain which is at baseline.  Denies any neck stiffness or neck rigidity.  Has not had a fever at home.  Has had some generalized chills and myalgias.  No diarrhea, recent antibiotic use.  Denies additional aggravating or relieving factors.  History from patient and past medical records.  No interpreter used.  HPI     Past Medical History:  Diagnosis Date  . Anxiety    takes Klonopin daily as needed  . Asthma    uses Combivent daily as needed  . Back pain   . Bipolar 1 disorder (HCC)    takes Lamictal daily  . CAP (community acquired pneumonia) 01/07/2015  . Chronic low back pain    HNP and Radiculopathy  . Collapsed lung 3/15   "while in hospital awaiting back OR"  . Cough   . GERD (gastroesophageal reflux disease)    takes Omeprazole daily  . Hypertension   . Insomnia    takes Trazodone nightly  . MRSA (methicillin resistant Staphylococcus aureus)    Postive nasal swab  . PONV (postoperative nausea and vomiting)   . Spinal headache   . Substance abuse (HCC)   . Urinary urgency   . Weakness    left leg    Patient Active Problem List   Diagnosis Date Noted  . Opioid  type dependence, continuous (HCC) 07/02/2015  . Generalized anxiety disorder 07/02/2015  . Tobacco abuse 01/07/2015  . Major depressive disorder, recurrent severe without psychotic features (HCC)   . Protein-calorie malnutrition, severe (HCC) 09/18/2013  . Acute respiratory failure with hypoxia (HCC) 09/18/2013  . History of suicide attempt 09/14/2013  . Compression fracture of L1 lumbar vertebra with delayed healing 09/14/2013  . Chronic low back pain 08/22/2013  . Bipolar disorder (HCC) 09/29/2012    Past Surgical History:  Procedure Laterality Date  . ANTERIOR CERVICAL DECOMP/DISCECTOMY FUSION  1996   "took bone off her hip"  . BACK SURGERY    . ELBOW FRACTURE SURGERY Left 1975  . ELBOW FRACTURE SURGERY Left 2002   "redo"  . EPIDURAL BLOCK INJECTION    . FRACTURE SURGERY    . HARDWARE REMOVAL  2008   "took screw out of lower back"  . LUMBAR DISC SURGERY  2008 X 2  . LUMBAR LAMINECTOMY/DECOMPRESSION MICRODISCECTOMY Left 07/13/2013   Procedure: Left Lumbar four-five microdiskectomy;  Surgeon: Carmela Hurt, MD;  Location: [facility];  Service: Neurosurgery;  Laterality: Left;  Left Lumbar four-five microdiskectomy  . LUMBAR WOUND DEBRIDEMENT N/A 09/21/2013   Procedure: LUMBAR WOUND re-exploration and disectomy;  Surgeon: Carmela Hurt, MD;  Location: [facility];  Service: Neurosurgery;  Laterality: N/A;  . PERCUTANEOUS PINNING Right  1992   "hand"  . POSTERIOR LUMBAR FUSION  2008  . TONSILLECTOMY       OB History   No obstetric history on file.     Family History  Problem Relation Age of Onset  . Multiple sclerosis Mother   . COPD Father   . Alcohol abuse Maternal Grandfather   . Alcohol abuse Brother   . Cirrhosis Brother     Social History   Tobacco Use  . Smoking status: Current Every Day Smoker    Packs/day: 0.50    Years: 18.00    Pack years: 9.00    Types: Cigarettes  . Smokeless tobacco: Never Used  Substance Use Topics  . Alcohol use: No  . Drug  use: Yes    Types: Heroin, IV, Marijuana    Comment: pt states heroin last use 02/13/16, last marijuana use  last week    Home Medications Prior to Admission medications   Medication Sig Start Date End Date Taking? Authorizing Provider  albuterol (VENTOLIN HFA) 108 (90 Base) MCG/ACT inhaler Inhale 2 puffs into the lungs every 6 (six) hours as needed for wheezing or shortness of breath.   Yes [provider]  clonazePAM (KLONOPIN) 1 MG tablet Take 1 mg by mouth 2 (two) times daily as needed for anxiety.  12/04/15  Yes [provider]  ibuprofen (ADVIL) 200 MG tablet Take 200-800 mg by mouth every 6 (six) hours as needed (for pain).   Yes [provider]  omeprazole (PRILOSEC) 20 MG capsule Take 20 mg by mouth daily before breakfast.    Yes [provider]  ondansetron (ZOFRAN) 4 MG tablet Take 4 mg by mouth 3 (three) times daily as needed for nausea or vomiting.  10/18/19  Yes [provider]  pregabalin (LYRICA) 300 MG capsule Take 1 capsule (300 mg total) by mouth 2 (two) times daily. Patient taking differently: Take 300 mg 3 (three) times daily by mouth.  09/11/15  Yes Adonis Brook, NP  SUBOXONE 8-2 MG FILM Place 1 Film under the tongue 2 (two) times daily.  04/21/17  Yes [provider]  traZODone (DESYREL) 100 MG tablet Take 100 mg at bedtime by mouth.   Yes [provider]  amoxicillin-clavulanate (AUGMENTIN) 875-125 MG tablet Take 1 tablet by mouth 2 (two) times daily. One po bid x 7 days Patient not taking: Reported on 01/15/2020 10/08/17   Muthersbaugh, Dahlia Client, PA-C  ANORO ELLIPTA 62.5-25 MCG/INH AEPB Inhale 1 puff into the lungs daily. Patient not taking: Reported on 01/15/2020 05/23/19   [provider]  azithromycin (ZITHROMAX) 250 MG tablet Take 250 mg by mouth as directed. 01/15/20   [provider]  COMBIVENT RESPIMAT 20-100 MCG/ACT AERS respimat Inhale 2 puffs every 6 (six) hours as needed into the  lungs. Patient not taking: Reported on 01/15/2020 05/08/17   Reymundo Poll, MD  cyclobenzaprine (FLEXERIL) 10 MG tablet Take 10 mg by mouth 3 (three) times daily. 01/15/20   [provider]  fluticasone (FLONASE) 50 MCG/ACT nasal spray Place 2 sprays daily into both nostrils. Patient not taking: Reported on 01/15/2020 05/08/17   Reymundo Poll, MD  hydrOXYzine (ATARAX/VISTARIL) 25 MG tablet Take 1 tablet (25 mg total) by mouth 3 (three) times daily as needed. Patient not taking: Reported on 01/15/2020 03/15/16   Garlon Hatchet, PA-C  methocarbamol (ROBAXIN) 500 MG tablet Take 1 tablet (500 mg total) by mouth 2 (two) times daily. Patient not taking: Reported on 01/15/2020 12/23/15   Rhunette Croft,  Ankit, MD  naproxen (NAPROSYN) 500 MG tablet Take 1 tablet (500 mg total) by mouth 2 (two) times daily as needed for moderate pain (spasm). Patient not taking: Reported on 01/15/2020 12/23/15   Derwood Kaplan, MD  nortriptyline (PAMELOR) 25 MG capsule One capsule at night for 2 weeks, then take 2 capsules at night Patient not taking: Reported on 01/15/2020 12/25/15   York Spaniel, MD  predniSONE (DELTASONE) 20 MG tablet Take 2 tablets (40 mg total) daily with breakfast by mouth. Patient not taking: Reported on 01/15/2020 05/09/17   Reymundo Poll, MD  QUEtiapine (SEROQUEL) 200 MG tablet Take 1 tablet (200 mg total) by mouth at bedtime. Patient not taking: Reported on 01/15/2020 09/11/15   Adonis Brook, NP    Allergies    Minocycline and Tramadol  Review of Systems   Review of Systems  Constitutional: Negative.   HENT: Negative.   Eyes: Negative.   Respiratory: Positive for cough, shortness of breath and wheezing. Negative for apnea, choking, chest tightness and stridor.   Cardiovascular: Negative.   Gastrointestinal: Positive for abdominal pain (Chronic abd pain).  Genitourinary: Negative.   Musculoskeletal: Negative.   Skin: Negative.   Neurological: Positive for weakness  (Generalized). Negative for dizziness, tremors, seizures, syncope, facial asymmetry, speech difficulty, light-headedness, numbness and headaches.  All other systems reviewed and are negative.   Physical Exam Updated Vital Signs BP 101/73   Pulse 69   Temp 97.8 F (36.6 C) (Oral)   Resp 12   LMP 02/13/2016   SpO2 95%   Physical Exam Vitals and nursing note reviewed.  Constitutional:      General: She is not in acute distress.    Appearance: She is well-developed. She is not ill-appearing or toxic-appearing.  HENT:     Head: Normocephalic and atraumatic.     Nose: Nose normal.     Mouth/Throat:     Mouth: Mucous membranes are moist.  Eyes:     Pupils: Pupils are equal, round, and reactive to light.  Neck:     Trachea: Trachea and phonation normal.     Meningeal: Brudzinski's sign and Kernig's sign absent.     Comments: No meningismus.  Cardiovascular:     Rate and Rhythm: Normal rate.     Pulses: Normal pulses.     Heart sounds: Normal heart sounds.  Pulmonary:     Effort: No respiratory distress.     Comments: Rhonchi and wheeze expiratory bilaterally.  Wet cough.  2 L oxygen via nasal cannula. No retractions. Abdominal:     General: Bowel sounds are normal. There is no distension.     Tenderness: There is no abdominal tenderness. There is no right CVA tenderness, left CVA tenderness, guarding or rebound.  Musculoskeletal:        General: Normal range of motion.     Cervical back: Full passive range of motion without pain and normal range of motion.     Comments: Moves all 4 extremities. Head tilted to left side.  Skin:    General: Skin is warm and dry.     Comments: Brisk cap refill. No edema, erythema, warmth  Neurological:     Mental Status: She is alert.     Comments: 4/5 strength bilaterally. CN 2-12 grossly intact    ED Results / Procedures / Treatments   Labs (all labs ordered are listed, but only abnormal results are displayed) Labs Reviewed   URINALYSIS, ROUTINE W REFLEX MICROSCOPIC - Abnormal; Notable for the following  components:      Result Value   Color, Urine STRAW (*)    Specific Gravity, Urine 1.003 (*)    All other components within normal limits  I-STAT VENOUS BLOOD GAS, ED - Abnormal; Notable for the following components:   pO2, Ven 23.0 (*)    Bicarbonate 30.6 (*)    Acid-Base Excess 4.0 (*)    Calcium, Ion 1.12 (*)    All other components within normal limits  SARS CORONAVIRUS 2 BY RT PCR (HOSPITAL ORDER, PERFORMED IN [facility])  CBC WITH DIFFERENTIAL/PLATELET  BASIC METABOLIC PANEL  RAPID URINE DRUG SCREEN, HOSP PERFORMED  BLOOD GAS, VENOUS    EKG EKG Interpretation  Date/Time:  Tuesday January 15 2020 14:26:04 EDT Ventricular Rate:  78 PR Interval:    QRS Duration: 91 QT Interval:  390 QTC Calculation: 445 R Axis:   43 Text Interpretation: Sinus rhythm RSR' in V1 or V2, right VCD or RVH No significant change since prior 5/21 Confirmed by Meridee ScoreButler, Michael [phone]([id number]) on 01/15/2020 2:46:00 PM   Radiology CT Angio Chest PE W and/or Wo Contrast  Result Date: 01/15/2020 CLINICAL DATA:  Hypoxemia. EXAM: CT ANGIOGRAPHY CHEST WITH CONTRAST TECHNIQUE: Multidetector CT imaging of the chest was performed using the standard protocol during bolus administration of intravenous contrast. Multiplanar CT image reconstructions and MIPs were obtained to evaluate the vascular anatomy. CONTRAST:  75mL OMNIPAQUE IOHEXOL 350 MG/ML SOLN COMPARISON:  January 07, 2015. FINDINGS: Cardiovascular: Satisfactory opacification of the pulmonary arteries to the segmental level. No evidence of pulmonary embolism. Normal heart size. No pericardial effusion. Mediastinum/Nodes: No enlarged mediastinal, hilar, or axillary lymph nodes. Thyroid gland, trachea, and esophagus demonstrate no significant findings. Lungs/Pleura: No pneumothorax or pleural effusion is noted. Multiple small airspace opacities are noted in the upper lobes which are  decreased compared to prior exam of 2016 in most likely represent postinfectious scarring. However, new multifocal airspace opacities are noted in the left lower lobe most consistent with focal multifocal pneumonia. The largest such abnormality measures 15 x 9 mm. Upper Abdomen: No acute abnormality. Musculoskeletal: No chest wall abnormality. No acute or significant osseous findings. Review of the MIP images confirms the above findings. IMPRESSION: 1. No definite evidence of pulmonary embolus. 2. Multiple small airspace opacities are noted in the upper lobes which are decreased compared to prior exam of 2016 in most likely represent postinfectious scarring. 3. However, new multifocal airspace opacities are noted in the left lower lobe most consistent with multifocal pneumonia. The largest such abnormality measures 15 x 9 mm. Unenhanced follow-up chest CT in 3-4 weeks is recommended to ensure resolution and rule out underlying neoplasm. Electronically Signed   By: Lupita RaiderJames  Green Jr M.D.   On: 01/15/2020 18:35   DG Chest Portable 1 View  Result Date: 01/15/2020 CLINICAL DATA:  Cough, hypoxia. EXAM: PORTABLE CHEST 1 VIEW COMPARISON:  June 07, 2019. FINDINGS: The heart size and mediastinal contours are within normal limits. Both lungs are clear. The visualized skeletal structures are unremarkable. IMPRESSION: No active disease. Electronically Signed   By: Lupita RaiderJames  Green Jr M.D.   On: 01/15/2020 15:19    Procedures Procedures (including critical care time)  Medications Ordered in ED Medications  cefTRIAXone (ROCEPHIN) 1 g in sodium chloride 0.9 % 100 mL IVPB (has no administration in time range)  azithromycin (ZITHROMAX) 500 mg in sodium chloride 0.9 % 250 mL IVPB (has no administration in time range)  methylPREDNISolone sodium succinate (SOLU-MEDROL) 125 mg/2 mL injection 125 mg (  125 mg Intravenous Given 01/15/20 1705)  albuterol (VENTOLIN HFA) 108 (90 Base) MCG/ACT inhaler 2 puff (2 puffs Inhalation  Given 01/15/20 1704)  ipratropium (ATROVENT HFA) inhaler 2 puff (2 puffs Inhalation Given 01/15/20 1704)  sodium chloride 0.9 % bolus 1,000 mL (0 mLs Intravenous Stopped 01/15/20 1810)  ondansetron (ZOFRAN) injection 4 mg (4 mg Intravenous Given 01/15/20 1705)  iohexol (OMNIPAQUE) 350 MG/ML injection 100 mL (75 mLs Intravenous Contrast Given 01/15/20 1759)    ED Course  I have reviewed the triage vital signs and the nursing notes.  Pertinent labs & imaging results that were available during my care of the patient were reviewed by me and considered in my medical decision making (see chart for details).  49 year old presents for evaluation of cough and hypoxia.  She is afebrile, nonseptic appearing.  Apparently was sleepy with EMS however she is sitting in bed, easy to arouse for myself.  She has a nonfocal neuro exam.  She does have her head tilted to the left and she states she has chronic neck pain.  She has a negative Kernig's, Brudzinski sign.  Low suspicion for meningitis.  Her clear.  She does have expiratory rales and wheeze.  She is on 2 L via nasal cannula and does not use home oxygen.  She does have some chronic diffuse abdominal pain which patient states remains unchanged.  Denies any IV drug use.  Moves all 4 extremities without difficulty does appear generally weak on exam 4/5 strength bilateral upper and lower extremities.  No evidence of rashes or lesions.  Did not receive COVID vaccine. Plan on labs, imaging and reassess.  Labs imaging personally reviewed and interpreted:  CBC without leukocytosis BMP without electrolyte, renal or liver abnormality UA negative for infection COVID pending UDS negative EKG without STEMI DG chest without acute infiltrates  CTA chest with multifocal PNA.  Patient reassessed.  Initially was able to tolerate ambulation however dropped to 86% on RA with minimal ambulation. Drops O2 when sleeping to 88% on RA. On 2 L [place]. Will admit acute hypoxic  respiratory failure due to multifocal pneumonia.  Question Covid pneumonia. Pending swab. Will give one dose of ABX until COVID test results.  CONSULT with Dr. Cyndia Bent with [facility] who will evaluate patient for admission.  The patient appears reasonably stabilized for admission considering the current resources, flow, and capabilities available in the ED at this time, and I doubt any other [facility] requiring further screening and/or treatment in the ED prior to admission.   Clinical Course as of Jan 14 2022  Tue Jan 15, 2020  1387 49 year old female history of chronic neck pain here with increased shortness of breath.  Smoker.  Did not get Covid vaccinations.  Denies any fever.  Been going on for a week and getting progressively worse.  Rhonchi on exam and hypoxic requiring oxygen.  Getting labs chest x-ray EKG Covid testing.  Likely will need admission.   [MB]    Clinical Course User Index [MB] Terrilee Files, MD   MDM Rules/Calculators/A&P                           Final Clinical Impression(s) / ED Diagnoses Final diagnoses:  Multifocal pneumonia  Acute respiratory failure with hypoxia [facility])    Rx / DC Orders ED Discharge Orders    None       Lielle Vandervort A, PA-C 01/15/20 2023    Terrilee Files,  MD 01/16/20 1111

## 2020-01-15 NOTE — ED Notes (Signed)
Pt used BSC to urinate. While ambulating back to stretcher bed, pt O2sat dropped down to 86% from 99% on room air. Once pt was back in bed, 2L O2 via nasal cannula was applied.

## 2020-01-15 NOTE — H&P (Signed)
History and Physical    Rachael Ramirez WNU:[id number] DOB: 09/15/1970 DOA: 01/15/2020  PCP: Rachael Hides, PA  Patient coming from: Home  I have personally briefly reviewed patient's old medical records in [facility]  Chief Complaint: Increasing shortness of breath  HPI: Rachael Ramirez is a 49 y.o. female with medical history significant for history of asthma, chronic pain, bipolar, depression anxiety and polysubstance abuse who presents with concerns of progressive worsening shortness of breath.  Patient started to note shortness of breath with exertion about a week ago associated with cough and chest congestion.  This progressively got worse throughout the week and her inhalers at home did not help.  Has been feeling nauseous but denies any vomiting.  Has had diarrhea.  No abdominal pain.  Has decreased appetite.  She denies any fever or sick contact.  Patient reports current tobacco use of 1 pack/day.  Denies other drug use and states she quit using IV heroin use ago.   ED Course: Patient was afebrile and noted to have been hypoxic down to 80% on room air especially with exertion and was placed on 2 L of O2 via nasal cannula.  Received 1 L normal saline bolus but appears to be more bradycardic and hypotensive like of 80s.    CTA chest negative for PE but showed new multifocal airspace opacity in the left lower lobe consistent with multifocal pneumonia.  Noncontrast chest CT in 3 to 4 weeks is recommended to ensure resolution and rule out of underlying neoplasm.  CBC shows no leukocytosis.  BMP unremarkable. UDS negative.   Review of Systems: Constitutional: No Weight Change, No Fever ENT/Mouth: No sore throat, No Rhinorrhea Eyes: No Eye Pain, No Vision Changes Cardiovascular: No Chest Pain, + SOB, No PND, + Dyspnea on Exertion Respiratory: + Cough, No Sputum Gastrointestinal: +Nausea, No Vomiting, + Diarrhea, No Pain Genitourinary: no Urinary Incontinence Musculoskeletal: No  Arthralgias, No Myalgias Skin: No Skin Lesions, No Pruritus, Neuro: no Weakness, No Numbness Psych: No Anxiety/Panic, No Depression, +decrease appetite Heme/Lymph: No Bruising, No Bleeding   Past Medical History:  Diagnosis Date  . Anxiety    takes Klonopin daily as needed  . Asthma    uses Combivent daily as needed  . Back pain   . Bipolar 1 disorder (HCC)    takes Lamictal daily  . CAP (community acquired pneumonia) 01/07/2015  . Chronic low back pain    HNP and Radiculopathy  . Collapsed lung 3/15   "while in hospital awaiting back OR"  . Cough   . GERD (gastroesophageal reflux disease)    takes Omeprazole daily  . Hypertension   . Insomnia    takes Trazodone nightly  . MRSA (methicillin resistant Staphylococcus aureus)    Postive nasal swab  . PONV (postoperative nausea and vomiting)   . Spinal headache   . Substance abuse (HCC)   . Urinary urgency   . Weakness    left leg    Past Surgical History:  Procedure Laterality Date  . ANTERIOR CERVICAL DECOMP/DISCECTOMY FUSION  1996   "took bone off her hip"  . BACK SURGERY    . ELBOW FRACTURE SURGERY Left 1975  . ELBOW FRACTURE SURGERY Left 2002   "redo"  . EPIDURAL BLOCK INJECTION    . FRACTURE SURGERY    . HARDWARE REMOVAL  2008   "took screw out of lower back"  . LUMBAR DISC SURGERY  2008 X 2  . LUMBAR LAMINECTOMY/DECOMPRESSION MICRODISCECTOMY Left 07/13/2013  Procedure: Left Lumbar four-five microdiskectomy;  Surgeon: Carmela Hurt, MD;  Location: [facility];  Service: Neurosurgery;  Laterality: Left;  Left Lumbar four-five microdiskectomy  . LUMBAR WOUND DEBRIDEMENT N/A 09/21/2013   Procedure: LUMBAR WOUND re-exploration and disectomy;  Surgeon: Carmela Hurt, MD;  Location: [facility];  Service: Neurosurgery;  Laterality: N/A;  . PERCUTANEOUS PINNING Right 1992   "hand"  . POSTERIOR LUMBAR FUSION  2008  . TONSILLECTOMY       reports that she has been smoking cigarettes. She has a 9.00 pack-year  smoking history. She has never used smokeless tobacco. She reports current drug use. Drugs: Heroin, IV, and Marijuana. She reports that she does not drink alcohol.  Allergies  Allergen Reactions  . Minocycline Nausea And Vomiting  . Tramadol Nausea And Vomiting    Family History  Problem Relation Age of Onset  . Multiple sclerosis Mother   . COPD Father   . Alcohol abuse Maternal Grandfather   . Alcohol abuse Brother   . Cirrhosis Brother      Prior to Admission medications   Medication Sig Start Date End Date Taking? Authorizing Provider  albuterol (VENTOLIN HFA) 108 (90 Base) MCG/ACT inhaler Inhale 2 puffs into the lungs every 6 (six) hours as needed for wheezing or shortness of breath.   Yes [provider]  clonazePAM (KLONOPIN) 1 MG tablet Take 1 mg by mouth 2 (two) times daily as needed for anxiety.  12/04/15  Yes [provider]  ibuprofen (ADVIL) 200 MG tablet Take 200-800 mg by mouth every 6 (six) hours as needed (for pain).   Yes [provider]  omeprazole (PRILOSEC) 20 MG capsule Take 20 mg by mouth daily before breakfast.    Yes [provider]  ondansetron (ZOFRAN) 4 MG tablet Take 4 mg by mouth 3 (three) times daily as needed for nausea or vomiting.  10/18/19  Yes [provider]  pregabalin (LYRICA) 300 MG capsule Take 1 capsule (300 mg total) by mouth 2 (two) times daily. Patient taking differently: Take 300 mg 3 (three) times daily by mouth.  09/11/15  Yes Adonis Brook, NP  SUBOXONE 8-2 MG FILM Place 1 Film under the tongue 2 (two) times daily.  04/21/17  Yes [provider]  traZODone (DESYREL) 100 MG tablet Take 100 mg at bedtime by mouth.   Yes [provider]  amoxicillin-clavulanate (AUGMENTIN) 875-125 MG tablet Take 1 tablet by mouth 2 (two) times daily. One po bid x 7 days Patient not taking: Reported on 01/15/2020 10/08/17   Muthersbaugh, Dahlia Client, PA-C  ANORO ELLIPTA 62.5-25 MCG/INH AEPB Inhale 1 puff  into the lungs daily. Patient not taking: Reported on 01/15/2020 05/23/19   [provider]  azithromycin (ZITHROMAX) 250 MG tablet Take 250 mg by mouth as directed. 01/15/20   [provider]  COMBIVENT RESPIMAT 20-100 MCG/ACT AERS respimat Inhale 2 puffs every 6 (six) hours as needed into the lungs. Patient not taking: Reported on 01/15/2020 05/08/17   Reymundo Poll, MD  cyclobenzaprine (FLEXERIL) 10 MG tablet Take 10 mg by mouth 3 (three) times daily. 01/15/20   [provider]  fluticasone (FLONASE) 50 MCG/ACT nasal spray Place 2 sprays daily into both nostrils. Patient not taking: Reported on 01/15/2020 05/08/17   Reymundo Poll, MD  hydrOXYzine (ATARAX/VISTARIL) 25 MG tablet Take 1 tablet (25 mg total) by mouth 3 (three) times daily as needed. Patient not taking: Reported on 01/15/2020 03/15/16   Garlon Hatchet, PA-C  methocarbamol (ROBAXIN) 500 MG tablet Take 1 tablet (500 mg total) by mouth 2 (two) times daily. Patient not taking: Reported on 01/15/2020 12/23/15   Derwood Kaplan, MD  naproxen (NAPROSYN) 500 MG tablet Take 1 tablet (500 mg total) by mouth 2 (two) times daily as needed for moderate pain (spasm). Patient not taking: Reported on 01/15/2020 12/23/15   Derwood Kaplan, MD  nortriptyline (PAMELOR) 25 MG capsule One capsule at night for 2 weeks, then take 2 capsules at night Patient not taking: Reported on 01/15/2020 12/25/15   York Spaniel, MD  predniSONE (DELTASONE) 20 MG tablet Take 2 tablets (40 mg total) daily with breakfast by mouth. Patient not taking: Reported on 01/15/2020 05/09/17   Reymundo Poll, MD  QUEtiapine (SEROQUEL) 200 MG tablet Take 1 tablet (200 mg total) by mouth at bedtime. Patient not taking: Reported on 01/15/2020 09/11/15   Adonis Brook, NP    Physical Exam: Vitals:   01/15/20 1941 01/15/20 2001 01/15/20 2021 01/15/20 2035  BP: (!) 90/58 (!) 85/60 (!) 85/62 100/63  Pulse: (!) 57 (!) 53 (!) 48 (!) 49  Resp: 16 19 17  17   Temp:      TempSrc:      SpO2: 98% 97% 96% 96%    Constitutional: NAD, calm, comfortable ill appearing lethargic female appearing older than her stated age laying at 52 degree incline in bed asleep Vitals:   01/15/20 1941 01/15/20 2001 01/15/20 2021 01/15/20 2035  BP: (!) 90/58 (!) 85/60 (!) 85/62 100/63  Pulse: (!) 57 (!) 53 (!) 48 (!) 49  Resp: 16 19 17 17   Temp:      TempSrc:      SpO2: 98% 97% 96% 96%   Eyes: PERRL, lids and conjunctivae normal ENMT: Mucous membranes are moist.  Neck: normal, supple Respiratory: clear to auscultation bilaterally, no wheezing, no crackles. Normal respiratory effort on 2 L O2 via nasal cannula. No accessory muscle use.  Cardiovascular: Sinus bradycardia in the 50s on telemetry, no murmurs / rubs / gallops. No extremity edema.   Abdomen: no tenderness, no masses palpated. Bowel sounds positive.  Musculoskeletal: no clubbing / cyanosis. No joint deformity upper and lower extremities. Good ROM, no contractures. Normal muscle tone.  Skin: no rashes, lesions, ulcers. No induration Neurologic: Patient is drowsy and lethargic and required multiple prompting to awake.  Alert and oriented x4.  CN 2-12 grossly intact. Sensation intact,  Strength 5/5 in all 4.  Psychiatric: Alert and oriented x4.  Flat affect with limited eye contact and will look often into the distance.     Labs on Admission: I have personally reviewed following labs and imaging studies  CBC: Recent Labs  Lab 01/15/20 1600 01/15/20 1616  WBC 7.1  --   NEUTROABS 3.7  --   HGB 13.9 14.6  HCT 44.8 43.0  MCV 96.6  --   PLT 263  --    Basic Metabolic Panel: Recent Labs  Lab 01/15/20 1600 01/15/20 1616  NA 141 142  K 4.6 4.0  CL 107  --   CO2 26  --   GLUCOSE 86  --   BUN 9  --   CREATININE 0.75  --   CALCIUM 8.9  --    GFR: CrCl cannot be calculated (Unknown ideal weight.). Liver Function Tests: No results for input(s): AST, ALT, ALKPHOS, BILITOT, PROT, ALBUMIN in  the last 168 hours. No results for input(s): LIPASE, AMYLASE in the last 168 hours. No results for input(s): AMMONIA  in the last 168 hours. Coagulation Profile: No results for input(s): INR, PROTIME in the last 168 hours. Cardiac Enzymes: No results for input(s): CKTOTAL, CKMB, CKMBINDEX, TROPONINI in the last 168 hours. BNP (last 3 results) No results for input(s): PROBNP in the last 8760 hours. HbA1C: No results for input(s): HGBA1C in the last 72 hours. CBG: No results for input(s): GLUCAP in the last 168 hours. Lipid Profile: No results for input(s): CHOL, HDL, LDLCALC, TRIG, CHOLHDL, LDLDIRECT in the last 72 hours. Thyroid Function Tests: No results for input(s): TSH, T4TOTAL, FREET4, T3FREE, THYROIDAB in the last 72 hours. Anemia Panel: No results for input(s): VITAMINB12, FOLATE, FERRITIN, TIBC, IRON, RETICCTPCT in the last 72 hours. Urine analysis:    Component Value Date/Time   COLORURINE STRAW (A) 01/15/2020 1622   APPEARANCEUR CLEAR 01/15/2020 1622   LABSPEC 1.003 (L) 01/15/2020 1622   PHURINE 7.0 01/15/2020 1622   GLUCOSEU NEGATIVE 01/15/2020 1622   HGBUR NEGATIVE 01/15/2020 1622   BILIRUBINUR NEGATIVE 01/15/2020 1622   KETONESUR NEGATIVE 01/15/2020 1622   PROTEINUR NEGATIVE 01/15/2020 1622   UROBILINOGEN 0.2 04/18/2015 1120   NITRITE NEGATIVE 01/15/2020 1622   LEUKOCYTESUR NEGATIVE 01/15/2020 1622    Radiological Exams on Admission: CT Angio Chest PE W and/or Wo Contrast  Result Date: 01/15/2020 CLINICAL DATA:  Hypoxemia. EXAM: CT ANGIOGRAPHY CHEST WITH CONTRAST TECHNIQUE: Multidetector CT imaging of the chest was performed using the standard protocol during bolus administration of intravenous contrast. Multiplanar CT image reconstructions and MIPs were obtained to evaluate the vascular anatomy. CONTRAST:  75mL OMNIPAQUE IOHEXOL 350 MG/ML SOLN COMPARISON:  January 07, 2015. FINDINGS: Cardiovascular: Satisfactory opacification of the pulmonary arteries to the segmental  level. No evidence of pulmonary embolism. Normal heart size. No pericardial effusion. Mediastinum/Nodes: No enlarged mediastinal, hilar, or axillary lymph nodes. Thyroid gland, trachea, and esophagus demonstrate no significant findings. Lungs/Pleura: No pneumothorax or pleural effusion is noted. Multiple small airspace opacities are noted in the upper lobes which are decreased compared to prior exam of 2016 in most likely represent postinfectious scarring. However, new multifocal airspace opacities are noted in the left lower lobe most consistent with focal multifocal pneumonia. The largest such abnormality measures 15 x 9 mm. Upper Abdomen: No acute abnormality. Musculoskeletal: No chest wall abnormality. No acute or significant osseous findings. Review of the MIP images confirms the above findings. IMPRESSION: 1. No definite evidence of pulmonary embolus. 2. Multiple small airspace opacities are noted in the upper lobes which are decreased compared to prior exam of 2016 in most likely represent postinfectious scarring. 3. However, new multifocal airspace opacities are noted in the left lower lobe most consistent with multifocal pneumonia. The largest such abnormality measures 15 x 9 mm. Unenhanced follow-up chest CT in 3-4 weeks is recommended to ensure resolution and rule out underlying neoplasm. Electronically Signed   By: Lupita RaiderJames  Green Jr M.D.   On: 01/15/2020 18:35   DG Chest Portable 1 View  Result Date: 01/15/2020 CLINICAL DATA:  Cough, hypoxia. EXAM: PORTABLE CHEST 1 VIEW COMPARISON:  June 07, 2019. FINDINGS: The heart size and mediastinal contours are within normal limits. Both lungs are clear. The visualized skeletal structures are unremarkable. IMPRESSION: No active disease. Electronically Signed   By: Lupita RaiderJames  Green Jr M.D.   On: 01/15/2020 15:19      Assessment/Plan  Acute hypoxic respiratory failure secondary to left-sided multifocal pneumonia Continue IV Rocephin and  azithromycin Albuterol as needed Flutter valve/incentive spirometry Patient with multifocal areas of opacity-noncontrast CT was recommended in  3 to 4 weeks by radiology to rule out neoplasm/observe for resolution  Hypotension/bradycardia Unclear etiology.  Patient is septic at this time. will continue to monitor on telemetry.  We will continue IV continuous fluid.  Acute metabolic encephalopathy Likely secondary to her multifocal pneumonia and hypoxia although patient still seems more sedated than expected from her illness UDS negative  Bipolar/anxiety/depression Switch Flexeril to PRN instead of scheduled Hold Trazodone due to her lethargic  Continue PRN Klonopin  Polysubstance abuse UDS negative.  Continue twice daily Suboxone-substance abuse record reviewed with pharmacy   DVT prophylaxis:.Lovenox Code Status: Full Family Communication: Plan discussed with patient at bedside  disposition Plan: Home with at least 2 midnight stays  Consults called:  Admission status: inpatient    Status is: Inpatient  Remains inpatient appropriate because:IV treatments appropriate due to intensity of illness or inability to take PO   Dispo: The patient is from: Home              Anticipated d/c is to: Home              Anticipated d/c date is: 3 days              Patient currently is not medically stable to d/c.         Anselm Jungling DO [facility]   If 7PM-7AM, please contact night-coverage [email or web address]   01/15/2020, 9:36 PM

## 2020-01-15 NOTE — ED Notes (Signed)
Admitting MD at bedside.

## 2020-01-16 ENCOUNTER — Encounter (HOSPITAL_COMMUNITY): Payer: Self-pay | Admitting: Emergency Medicine

## 2020-01-16 ENCOUNTER — Other Ambulatory Visit: Payer: Self-pay

## 2020-01-16 ENCOUNTER — Emergency Department (HOSPITAL_COMMUNITY)
Admission: EM | Admit: 2020-01-16 | Discharge: 2020-01-16 | Disposition: A | Discharge status: Home / Self Care | Payer: Managed Care, Other (non HMO) | Source: Home / Self Care

## 2020-01-16 ENCOUNTER — Emergency Department (HOSPITAL_COMMUNITY)
Admission: EM | Admit: 2020-01-16 | Discharge: 2020-01-16 | Disposition: A | Discharge status: Home / Self Care | Payer: Managed Care, Other (non HMO) | Attending: Emergency Medicine | Admitting: Emergency Medicine

## 2020-01-16 ENCOUNTER — Encounter (HOSPITAL_COMMUNITY): Payer: Self-pay | Admitting: Family Medicine

## 2020-01-16 DIAGNOSIS — Z5321 Procedure and treatment not carried out due to patient leaving prior to being seen by health care provider: Secondary | ICD-10-CM | POA: Insufficient documentation

## 2020-01-16 DIAGNOSIS — R42 Dizziness and giddiness: Secondary | ICD-10-CM | POA: Insufficient documentation

## 2020-01-16 DIAGNOSIS — J189 Pneumonia, unspecified organism: Secondary | ICD-10-CM | POA: Diagnosis not present

## 2020-01-16 DIAGNOSIS — J9601 Acute respiratory failure with hypoxia: Principal | ICD-10-CM

## 2020-01-16 DIAGNOSIS — R0602 Shortness of breath: Secondary | ICD-10-CM | POA: Insufficient documentation

## 2020-01-16 LAB — BASIC METABOLIC PANEL
Anion gap: 9 (ref 5–15)
BUN: 15 mg/dL (ref 6–20)
CO2: 25 mmol/L (ref 22–32)
Calcium: 9.4 mg/dL (ref 8.9–10.3)
Chloride: 105 mmol/L (ref 98–111)
Creatinine, Ser: 0.93 mg/dL (ref 0.44–1.00)
GFR calc Af Amer: >60 mL/min (ref >60)
GFR calc non Af Amer: >60 mL/min (ref >60)
Glucose, Bld: 112 mg/dL — ABNORMAL HIGH (ref 70–99)
Potassium: 4.4 mmol/L (ref 3.5–5.1)
Sodium: 139 mmol/L (ref 135–145)

## 2020-01-16 LAB — CBC
HCT: 40.6 % (ref 36.0–46.0)
Hemoglobin: 12.7 g/dL (ref 12.0–15.0)
MCH: 30.2 pg (ref 26.0–34.0)
MCHC: 31.3 g/dL (ref 30.0–36.0)
MCV: 96.4 fL (ref 80.0–100.0)
Platelets: 275 10*3/uL (ref 150–400)
RBC: 4.21 MIL/uL (ref 3.87–5.11)
RDW: 13.3 % (ref 11.5–15.5)
WBC: 10.8 10*3/uL — ABNORMAL HIGH (ref 4.0–10.5)
nRBC: 0 % (ref 0.0–0.2)

## 2020-01-16 LAB — HIV ANTIBODY (ROUTINE TESTING W REFLEX): HIV Screen 4th Generation wRfx: NONREACTIVE

## 2020-01-16 MED ORDER — SODIUM CHLORIDE 0.9% FLUSH: 3.0000 mL | Freq: Once | INTRAVENOUS | Status: DC

## 2020-01-16 MED ORDER — SODIUM CHLORIDE 0.9 % IV BOLUS: 1000.0000 mL | Freq: Once | INTRAVENOUS | Status: DC

## 2020-01-16 NOTE — ED Notes (Signed)
Pt apparently LWBS from lobby - returns now by EMS

## 2020-01-16 NOTE — ED Triage Notes (Signed)
Pt was here yesterday and was admitted for PNA bu had to leave to rake care of some personal issues and back today

## 2020-01-16 NOTE — ED Triage Notes (Signed)
Patient arrives to ED via [facility] with complaints of worsening SOB and dizziness. Pt was admitted in the ED last night and left AMA to feed her dogs. Pt was admitted for left sided multifocal left sided pneumonia. Pt hypotensive in triage and had low Bps yesterday when admitted.. unknown etiology. Pt 96% room air.

## 2020-01-16 NOTE — Discharge Summary (Signed)
PATIENT DETAILS Name: Rachael Ramirez Age: 49 y.o. Sex: female Date of Birth: 07-Jan-1971 MRN: [id number]. Admitting Physician: Anselm Jungling, DO [id number], [name], [place]  Admit Date: 01/15/2020 Discharge date: 01/16/2020  Note:patient left AMA  Recommendations for Outpatient Follow-up:  1. Please repeat CBC/BMET at next visit 2. Please repeat two-view chest x-ray/CT chest in the next 4 to 6 weeks  PRIMARY DISCHARGE DIAGNOSIS:  Principal Problem:   Acute respiratory failure with hypoxia (HCC) Active Problems:   Bipolar disorder (HCC)   Major depressive disorder, recurrent severe without psychotic features (HCC)   Generalized anxiety disorder   Hypotension   Bradycardia   Acute metabolic encephalopathy      PAST MEDICAL HISTORY: Past Medical History:  Diagnosis Date   Anxiety    takes Klonopin daily as needed   Asthma    uses Combivent daily as needed   Back pain    Bipolar 1 disorder (HCC)    takes Lamictal daily   CAP (community acquired pneumonia) 01/07/2015   Chronic low back pain    HNP and Radiculopathy   Collapsed lung 3/15   "while in hospital awaiting back OR"   Cough    GERD (gastroesophageal reflux disease)    takes Omeprazole daily   Hypertension    Insomnia    takes Trazodone nightly   MRSA (methicillin resistant Staphylococcus aureus)    Postive nasal swab   PONV (postoperative nausea and vomiting)    Spinal headache    Substance abuse (HCC)    Urinary urgency    Weakness    left leg    ALLERGIES:   Allergies  Allergen Reactions   Minocycline Nausea And Vomiting   Tramadol Nausea And Vomiting    BRIEF HPI:  See H&P, Labs, Consult and Test reports for all details in brief, patient is a 49 year old female with history of asthma, chronic pain, bipolar disorder who presented with shortness of breath-she was found to have multifocal pneumonia and admitted to the hospitalist service.  See below for further  details  CONSULTATIONS:   None  PERTINENT RADIOLOGIC STUDIES: CT Angio Chest PE W and/or Wo Contrast  Result Date: 01/15/2020 CLINICAL DATA:  Hypoxemia. EXAM: CT ANGIOGRAPHY CHEST WITH CONTRAST TECHNIQUE: Multidetector CT imaging of the chest was performed using the standard protocol during bolus administration of intravenous contrast. Multiplanar CT image reconstructions and MIPs were obtained to evaluate the vascular anatomy. CONTRAST:  46mL OMNIPAQUE IOHEXOL 350 MG/ML SOLN COMPARISON:  January 07, 2015. FINDINGS: Cardiovascular: Satisfactory opacification of the pulmonary arteries to the segmental level. No evidence of pulmonary embolism. Normal heart size. No pericardial effusion. Mediastinum/Nodes: No enlarged mediastinal, hilar, or axillary lymph nodes. Thyroid gland, trachea, and esophagus demonstrate no significant findings. Lungs/Pleura: No pneumothorax or pleural effusion is noted. Multiple small airspace opacities are noted in the upper lobes which are decreased compared to prior exam of 2016 in most likely represent postinfectious scarring. However, new multifocal airspace opacities are noted in the left lower lobe most consistent with focal multifocal pneumonia. The largest such abnormality measures 15 x 9 mm. Upper Abdomen: No acute abnormality. Musculoskeletal: No chest wall abnormality. No acute or significant osseous findings. Review of the MIP images confirms the above findings. IMPRESSION: 1. No definite evidence of pulmonary embolus. 2. Multiple small airspace opacities are noted in the upper lobes which are decreased compared to prior exam of 2016 in most likely represent postinfectious scarring. 3. However, new multifocal airspace opacities are noted in the left lower lobe  most consistent with multifocal pneumonia. The largest such abnormality measures 15 x 9 mm. Unenhanced follow-up chest CT in 3-4 weeks is recommended to ensure resolution and rule out underlying neoplasm. Electronically  Signed   By: Lupita Raider M.D.   On: 01/15/2020 18:35   DG Chest Portable 1 View  Result Date: 01/15/2020 CLINICAL DATA:  Cough, hypoxia. EXAM: PORTABLE CHEST 1 VIEW COMPARISON:  June 07, 2019. FINDINGS: The heart size and mediastinal contours are within normal limits. Both lungs are clear. The visualized skeletal structures are unremarkable. IMPRESSION: No active disease. Electronically Signed   By: Lupita Raider M.D.   On: 01/15/2020 15:19     PERTINENT LAB RESULTS: CBC: Recent Labs    01/15/20 1600 01/15/20 1616  WBC 7.1  --   HGB 13.9 14.6  HCT 44.8 43.0  PLT 263  --    CMET CMP     Component Value Date/Time   NA 142 01/15/2020 1616   NA 140 08/22/2013 0859   K 4.0 01/15/2020 1616   CL 107 01/15/2020 1600   CO2 26 01/15/2020 1600   GLUCOSE 86 01/15/2020 1600   BUN 9 01/15/2020 1600   BUN 4 (L) 08/22/2013 0859   CREATININE 0.75 01/15/2020 1600   CALCIUM 8.9 01/15/2020 1600   PROT 6.8 11/12/2019 1055   PROT 5.8 (L) 08/22/2013 0859   ALBUMIN 3.6 11/12/2019 1055   ALBUMIN 3.3 (L) 08/22/2013 0859   AST 16 11/12/2019 1055   ALT 9 11/12/2019 1055   ALKPHOS 87 11/12/2019 1055   BILITOT 0.2 (L) 11/12/2019 1055   GFRNONAA >60 01/15/2020 1600   GFRAA >60 01/15/2020 1600    GFR Estimated Creatinine Clearance: 66.5 mL/min (by C-G formula based on SCr of 0.75 mg/dL). No results for input(s): LIPASE, AMYLASE in the last 72 hours. No results for input(s): CKTOTAL, CKMB, CKMBINDEX, TROPONINI in the last 72 hours. Invalid input(s): POCBNP No results for input(s): DDIMER in the last 72 hours. No results for input(s): HGBA1C in the last 72 hours. No results for input(s): CHOL, HDL, LDLCALC, TRIG, CHOLHDL, LDLDIRECT in the last 72 hours. No results for input(s): TSH, T4TOTAL, T3FREE, THYROIDAB in the last 72 hours.  Invalid input(s): FREET3 No results for input(s): VITAMINB12, FOLATE, FERRITIN, TIBC, IRON, RETICCTPCT in the last 72 hours. Coags: No results for  input(s): INR in the last 72 hours.  Invalid input(s): PT Microbiology: Recent Results (from the past 240 hour(s))  SARS Coronavirus 2 by RT PCR (hospital order, performed in [facility] hospital lab) Nasopharyngeal Nasopharyngeal Swab     Status: None   Collection Time: 01/15/20  7:16 PM   Specimen: Nasopharyngeal Swab  Result Value Ref Range Status   SARS Coronavirus 2 NEGATIVE NEGATIVE Final    Comment: (NOTE) SARS-CoV-2 target nucleic acids are NOT DETECTED.  The SARS-CoV-2 RNA is generally detectable in upper and lower respiratory specimens during the acute phase of infection. The lowest concentration of SARS-CoV-2 viral copies this assay can detect is 250 copies / mL. A negative result does not preclude SARS-CoV-2 infection and should not be used as the sole basis for treatment or other patient management decisions.  A negative result may occur with improper specimen collection / handling, submission of specimen other than nasopharyngeal swab, presence of viral mutation(s) within the areas targeted by this assay, and inadequate number of viral copies (<250 copies / mL). A negative result must be combined with clinical observations, patient history, and epidemiological information.  Fact Sheet  for Patients:   BoilerBrush.com.cy  Fact Sheet for Healthcare Providers: https://pope.com/  This test is not yet approved or  cleared by the [place] FDA and has been authorized for detection and/or diagnosis of SARS-CoV-2 by FDA under an Emergency Use Authorization (EUA).  This EUA will remain in effect (meaning this test can be used) for the duration of the COVID-19 declaration under Section 564(b)(1) of the Act, 21 U.S.C. section 360bbb-3(b)(1), unless the authorization is terminated or revoked sooner.  Performed at [facility], [address], [place], [place] [address]      BRIEF HOSPITAL COURSE:  Acute hypoxic  respiratory failure secondary to multifocal pneumonia: She was admitted and started on empiric antimicrobial therapy-however before this MD could evaluate her this morning-she left AGAINST MEDICAL ADVICE.  Please see nursing documentation.    TODAY-DAY OF DISCHARGE:  Subjective:   Wade Sigala today has signed out against medical advice. He was warned about the life threatening and life disabling effects by RN.   Objective:   Blood pressure (!) 70/52, pulse (!) 48, temperature (!) 97.5 F (36.4 C), resp. rate 16, height 5\' 2"  (1.575 m), weight 55.8 kg, last menstrual period 02/13/2016, SpO2 99 %.   DISCHARGE CONDITION: Not stable for discharge-left AMA  DISPOSITION: AMA   Follow with your PCP in 1 week   Total Time spent on discharge equals 25  minutes.  Signed08/20/2017 01/16/2020 4:35 PM

## 2020-01-16 NOTE — Progress Notes (Signed)
RN received report from [employer]. Pt had taken IV out and telemetry off. RN reviewed pros and cons of leaving AMA. Pt reported she could not stay due to her ex-husband staying at her home with her children. RN reviewed medical needs pt is requiring for her low BP. She stated she still wanted to leave. RN informed Ghimire MD - he offered to come round on the pt and speak with her. Pt informed. Pt alert and oriented x4. Pt stated she still wanted to leave. AMA form signed. Pt left with her belongings. RN reviewed with pt need to go through ED if symptoms continue to worsen and/or she wants to pursue active treatment again. She verbalized understanding. RN provided emotional support to pt.   Pt left prior to Ghimire MD being able to speak with pt.

## 2021-07-26 ENCOUNTER — Emergency Department (HOSPITAL_COMMUNITY)
Admission: EM | Admit: 2021-07-26 | Discharge: 2021-07-26 | Disposition: A | Discharge status: Home / Self Care | Payer: Managed Care, Other (non HMO) | Attending: Emergency Medicine | Admitting: Emergency Medicine

## 2021-07-26 ENCOUNTER — Emergency Department (HOSPITAL_COMMUNITY): Payer: Managed Care, Other (non HMO)

## 2021-07-26 ENCOUNTER — Encounter (HOSPITAL_COMMUNITY): Payer: Self-pay | Admitting: Emergency Medicine

## 2021-07-26 ENCOUNTER — Other Ambulatory Visit: Payer: Self-pay

## 2021-07-26 DIAGNOSIS — J45909 Unspecified asthma, uncomplicated: Secondary | ICD-10-CM | POA: Diagnosis not present

## 2021-07-26 DIAGNOSIS — Z7951 Long term (current) use of inhaled steroids: Secondary | ICD-10-CM | POA: Diagnosis not present

## 2021-07-26 DIAGNOSIS — E876 Hypokalemia: Secondary | ICD-10-CM | POA: Insufficient documentation

## 2021-07-26 DIAGNOSIS — R110 Nausea: Secondary | ICD-10-CM | POA: Diagnosis not present

## 2021-07-26 DIAGNOSIS — R002 Palpitations: Secondary | ICD-10-CM | POA: Insufficient documentation

## 2021-07-26 DIAGNOSIS — R0789 Other chest pain: Secondary | ICD-10-CM | POA: Insufficient documentation

## 2021-07-26 DIAGNOSIS — R519 Headache, unspecified: Secondary | ICD-10-CM | POA: Diagnosis not present

## 2021-07-26 DIAGNOSIS — Z79899 Other long term (current) drug therapy: Secondary | ICD-10-CM | POA: Diagnosis not present

## 2021-07-26 DIAGNOSIS — I10 Essential (primary) hypertension: Secondary | ICD-10-CM | POA: Diagnosis not present

## 2021-07-26 LAB — BASIC METABOLIC PANEL
Anion gap: 10 (ref 5–15)
BUN: 9 mg/dL (ref 6–20)
CO2: 26 mmol/L (ref 22–32)
Calcium: 9.1 mg/dL (ref 8.9–10.3)
Chloride: 100 mmol/L (ref 98–111)
Creatinine, Ser: 0.82 mg/dL (ref 0.44–1.00)
GFR, Estimated: >60 mL/min (ref >60)
Glucose, Bld: 108 mg/dL — ABNORMAL HIGH (ref 70–99)
Potassium: 2.9 mmol/L — ABNORMAL LOW (ref 3.5–5.1)
Sodium: 136 mmol/L (ref 135–145)

## 2021-07-26 LAB — CBC
HCT: 45 % (ref 36.0–46.0)
Hemoglobin: 14.8 g/dL (ref 12.0–15.0)
MCH: 29.9 pg (ref 26.0–34.0)
MCHC: 32.9 g/dL (ref 30.0–36.0)
MCV: 90.9 fL (ref 80.0–100.0)
Platelets: 252 10*3/uL (ref 150–400)
RBC: 4.95 MIL/uL (ref 3.87–5.11)
RDW: 13.5 % (ref 11.5–15.5)
WBC: 9.5 10*3/uL (ref 4.0–10.5)
nRBC: 0 % (ref 0.0–0.2)

## 2021-07-26 LAB — TROPONIN I (HIGH SENSITIVITY)
Troponin I (High Sensitivity): 3 ng/L (ref <18)
Troponin I (High Sensitivity): 3 ng/L (ref <18)

## 2021-07-26 MED ORDER — POTASSIUM CHLORIDE CRYS ER 20 MEQ PO TBCR: 20.0000 meq | EXTENDED_RELEASE_TABLET | Freq: Every day | ORAL | 0 refills | Status: AC

## 2021-07-26 MED ORDER — SODIUM CHLORIDE 0.9 % IV BOLUS
1000.0000 mL | Freq: Once | INTRAVENOUS | Status: AC
  Administered 2021-07-26: 1000 mL via INTRAVENOUS

## 2021-07-26 MED ORDER — POTASSIUM CHLORIDE 10 MEQ/100ML IV SOLN
10.0000 meq | Freq: Once | INTRAVENOUS | Status: AC
Start: 2021-07-26 — End: 2021-07-26
  Administered 2021-07-26: 10 meq via INTRAVENOUS
  Filled 2021-07-26: qty 100

## 2021-07-26 MED ORDER — ONDANSETRON HCL 4 MG/2ML IJ SOLN
4.0000 mg | Freq: Once | INTRAMUSCULAR | Status: AC
Start: 2021-07-26 — End: 2021-07-26
  Administered 2021-07-26: 4 mg via INTRAVENOUS
  Filled 2021-07-26: qty 2

## 2021-07-26 MED ORDER — POTASSIUM CHLORIDE CRYS ER 20 MEQ PO TBCR
40.0000 meq | EXTENDED_RELEASE_TABLET | Freq: Once | ORAL | Status: AC
  Administered 2021-07-26: 40 meq via ORAL
  Filled 2021-07-26: qty 2

## 2021-07-26 MED ORDER — ALUM & MAG HYDROXIDE-SIMETH 200-200-20 MG/5ML PO SUSP
30.0000 mL | Freq: Once | ORAL | Status: AC
  Administered 2021-07-26: 30 mL via ORAL
  Filled 2021-07-26: qty 30

## 2021-07-26 NOTE — ED Provider Notes (Signed)
[facility]   CSN: [id number] Arrival date & time: 07/26/21  1722     History  Chief Complaint  Patient presents with   Chest Pain    Rachael FreezeRachel Ramirez is a 51 y.o. female.  The history is provided by the patient and medical records. No language interpreter was used.  Chest Pain  [age] year old female significant history of bipolar, polysubstance abuse, hypertension, GERD, asthma brought here via EMS from home with complaints of chest pain.  Patient report for the past few days she has had intermittent pain in the center of her chest with sensation of nausea, dry heaving, heart palpitation, headache, and dizziness.  Symptoms moderate in severity and felt different from her heartburn.  She attributed her symptoms due to being upset about losing her mom nearly a year ago.  She is not eating and drinking like usual and having trouble sleeping.  She denies SI HI.  She has history of polysubstance abuse but states she has been clean for about 6 years.  She denies any alcohol use.  She denies tobacco use.  She does not complain of shortness of breath.  Home Medications Prior to Admission medications   Medication Sig Start Date End Date Taking? Authorizing Provider  albuterol (VENTOLIN HFA) 108 (90 Base) MCG/ACT inhaler Inhale 2 puffs into the lungs every 6 (six) hours as needed for wheezing or shortness of breath.    [provider]  amoxicillin-clavulanate (AUGMENTIN) 875-125 MG tablet Take 1 tablet by mouth 2 (two) times daily. One po bid x 7 days Patient not taking: Reported on 01/15/2020 10/08/17   Muthersbaugh, Dahlia ClientHannah, PA-C  ANORO ELLIPTA 62.5-25 MCG/INH AEPB Inhale 1 puff into the lungs daily. Patient not taking: Reported on 01/15/2020 05/23/19   [provider]  azithromycin (ZITHROMAX) 250 MG tablet Take 250 mg by mouth as directed. 01/15/20   [provider]  clonazePAM (KLONOPIN) 1 MG tablet Take 1 mg by mouth 2  (two) times daily as needed for anxiety.  12/04/15   [provider]  COMBIVENT RESPIMAT 20-100 MCG/ACT AERS respimat Inhale 2 puffs every 6 (six) hours as needed into the lungs. Patient not taking: Reported on 01/15/2020 05/08/17   Reymundo PollGuilloud, Carolyn, MD  cyclobenzaprine (FLEXERIL) 10 MG tablet Take 10 mg by mouth 3 (three) times daily. 01/15/20   [provider]  fluticasone (FLONASE) 50 MCG/ACT nasal spray Place 2 sprays daily into both nostrils. Patient not taking: Reported on 01/15/2020 05/08/17   Reymundo PollGuilloud, Carolyn, MD  hydrOXYzine (ATARAX/VISTARIL) 25 MG tablet Take 1 tablet (25 mg total) by mouth 3 (three) times daily as needed. Patient not taking: Reported on 01/15/2020 03/15/16   Garlon HatchetSanders, Lisa M, PA-C  ibuprofen (ADVIL) 200 MG tablet Take 200-800 mg by mouth every 6 (six) hours as needed (for pain).    [provider]  methocarbamol (ROBAXIN) 500 MG tablet Take 1 tablet (500 mg total) by mouth 2 (two) times daily. Patient not taking: Reported on 01/15/2020 12/23/15   Derwood KaplanNanavati, Ankit, MD  naproxen (NAPROSYN) 500 MG tablet Take 1 tablet (500 mg total) by mouth 2 (two) times daily as needed for moderate pain (spasm). Patient not taking: Reported on 01/15/2020 12/23/15   Derwood KaplanNanavati, Ankit, MD  nortriptyline (PAMELOR) 25 MG capsule One capsule at night for 2 weeks, then take 2 capsules at night Patient not taking: Reported on 01/15/2020 12/25/15   York SpanielWillis, Charles K, MD  omeprazole (PRILOSEC) 20 MG capsule Take 20 mg by mouth  daily before breakfast.     [provider]  ondansetron (ZOFRAN) 4 MG tablet Take 4 mg by mouth 3 (three) times daily as needed for nausea or vomiting.  10/18/19   [provider]  predniSONE (DELTASONE) 20 MG tablet Take 2 tablets (40 mg total) daily with breakfast by mouth. Patient not taking: Reported on 01/15/2020 05/09/17   Reymundo Poll, MD  pregabalin (LYRICA) 300 MG capsule Take 1 capsule (300 mg total) by mouth 2 (two) times  daily. Patient taking differently: Take 300 mg 3 (three) times daily by mouth.  09/11/15   Adonis Brook, NP  QUEtiapine (SEROQUEL) 200 MG tablet Take 1 tablet (200 mg total) by mouth at bedtime. Patient not taking: Reported on 01/15/2020 09/11/15   Adonis Brook, NP  SUBOXONE 8-2 MG FILM Place 1 Film under the tongue 2 (two) times daily.  04/21/17   [provider]  traZODone (DESYREL) 100 MG tablet Take 100 mg at bedtime by mouth.    [provider]      Allergies    Minocycline and Tramadol    Review of Systems   Review of Systems  Cardiovascular:  Positive for chest pain.  All other systems reviewed and are negative.  Physical Exam Updated Vital Signs BP 95/78 (BP Location: Right Arm)    Pulse 90    Temp 98.8 F (37.1 C) (Oral)    Resp 16    Ht 5\' 2"  (1.575 m)    Wt 61.2 kg    LMP 02/13/2016    SpO2 93%    BMI 24.69 kg/m  Physical Exam Vitals and nursing note reviewed.  Constitutional:      General: She is not in acute distress.    Appearance: She is well-developed.  HENT:     Head: Atraumatic.  Eyes:     Conjunctiva/sclera: Conjunctivae normal.  Cardiovascular:     Rate and Rhythm: Normal rate and regular rhythm.     Pulses: Normal pulses.     Heart sounds: Normal heart sounds.  Pulmonary:     Effort: Pulmonary effort is normal.     Breath sounds: No wheezing, rhonchi or rales.  Chest:     Chest wall: No tenderness.  Abdominal:     Palpations: Abdomen is soft.  Musculoskeletal:     Cervical back: Neck supple.     Right lower leg: No edema.     Left lower leg: No edema.  Skin:    Findings: No rash.  Neurological:     Mental Status: She is alert. Mental status is at baseline.  Psychiatric:        Mood and Affect: Mood normal.    ED Results / Procedures / Treatments   Labs (all labs ordered are listed, but only abnormal results are displayed) Labs Reviewed  BASIC METABOLIC PANEL - Abnormal; Notable for the following components:       Result Value   Potassium 2.9 (*)    Glucose, Bld 108 (*)    All other components within normal limits  CBC  TROPONIN I (HIGH SENSITIVITY)  TROPONIN I (HIGH SENSITIVITY)    EKG EKG Interpretation  Date/Time:  Sunday July 26 2021 17:23:01 EST Ventricular Rate:  91 PR Interval:  158 QRS Duration: 78 QT Interval:  338 QTC Calculation: 415 R Axis:   71 Text Interpretation: Normal sinus rhythm nonspecific t wave abnormality no acute STEMI Confirmed by [id number] (Marianna Fuss) on 07/26/2021 8:19:09 PM ED ECG REPORT   Date: 07/26/2021  Rate: 91  Rhythm: normal sinus rhythm  QRS Axis: normal  Intervals: normal  ST/T Wave abnormalities: nonspecific T wave changes  Conduction Disutrbances:none  Narrative Interpretation:   Old EKG Reviewed: unchanged  I have personally reviewed the EKG tracing and agree with the computerized printout as noted.   Radiology DG Chest 2 View  Result Date: 07/26/2021 CLINICAL DATA:  Chest pain. EXAM: CHEST - 2 VIEW COMPARISON:  Chest x-ray 01/15/2020. CT chest 04/05/2020. CT abdomen and pelvis 10/08/2017. FINDINGS: The heart size and mediastinal contours are within normal limits. There is no lung consolidation, pleural effusion or pneumothorax identified. Questionable small nodular density in the right lower lung projecting over the posterior ninth rib. The visualized skeletal structures are unremarkable. There is stable compression deformity of L1. No acute fractures are seen. IMPRESSION: 1. No acute cardiopulmonary process. 2. Questionable small nodular density in the right lower lung. Recommend follow-up nonemergent chest CT. Electronically Signed   By: Darliss Cheney M.D.   On: 07/26/2021 18:07    Procedures Procedures    Medications Ordered in ED Medications  alum & mag hydroxide-simeth (MAALOX/MYLANTA) 200-200-20 MG/5ML suspension 30 mL (30 mLs Oral Given 07/26/21 1921)  sodium chloride 0.9 % bolus 1,000 mL (0 mLs Intravenous Stopped 07/26/21 2209)   ondansetron (ZOFRAN) injection 4 mg (4 mg Intravenous Given 07/26/21 1920)  potassium chloride SA (KLOR-CON M) CR tablet 40 mEq (40 mEq Oral Given 07/26/21 2019)  potassium chloride 10 mEq in 100 mL IVPB (0 mEq Intravenous Stopped 07/26/21 2209)    ED Course/ Medical Decision Making/ A&P                           Medical Decision Making Problems Addressed: Atypical chest pain: acute illness or injury    Details: -doubt ACS or PE -outpt recommended Hypokalemia: acute illness or injury    Details: -supplementation given -recommend recheck by PCP  Amount and/or Complexity of Data Reviewed External Data Reviewed: labs, radiology, ECG and notes. Labs: ordered. Decision-making details documented in ED Course. Radiology: ordered and independent interpretation performed. Decision-making details documented in ED Course. ECG/medicine tests: ordered and independent interpretation performed. Decision-making details documented in ED Course.  Risk OTC drugs. Prescription drug management.   BP 94/67    Pulse 70    Temp 98.8 F (37.1 C) (Oral)    Resp 15    Ht 5\' 2"  (1.575 m)    Wt 61.2 kg    LMP 02/13/2016    SpO2 98%    BMI 24.69 kg/m   6:53 PM This is a 51 year old female with history of depression, bipolar, polysubstance use however reportedly have been clean for 6 years who presents with complaints of chest pain.  She felt that her pain is likely due to feeling sad about not having her mother around.  Her mom passed nearly a year ago.  She also endorsed not being able to sleep or eat like normal.  She does not have any significant reproducible chest wall pain on exam.  She appears to be in no acute discomfort.  Her pain is not typical of ACS.  Work-up initiated.  7:45 PM Labs, EKG, and chest x-ray was independently reviewed interpreted by me.  Patient does have evidence of hypokalemia with potassium of 2.9 without EKG changes, will provide potassium supplementation.  No WBC, normal H&H,  initial troponin is normal, EKG shows some mild ST changes in the inferior leadsSimilar to prior.  Chest  x-ray obtained showing no acute cardiopulmonary process but there is a questionable small nodular density in the right lower lung.  This can be follow-up on a nonemergent chest CT.  I did discuss with patient.   11:42 PM Please note pt's vital sign shows episodes of hypoxia, but this is due to pt sleeping and once she is arouse her O2 normalized.  I have low suspicion for PE.  At this time pt is stable for discharge with potassium supplementation, outpt f/u, resources given.  Return precaution given.           Final Clinical Impression(s) / ED Diagnoses Final diagnoses:  Atypical chest pain  Hypokalemia    Rx / DC Orders ED Discharge Orders          Ordered    potassium chloride SA (KLOR-CON M) 20 MEQ tablet  Daily        07/26/21 2322              Fayrene Helperran, Eliya Geiman, PA-C 07/26/21 2345    Milagros Lollykstra, Richard S, MD 07/27/21 1331

## 2021-07-26 NOTE — Discharge Instructions (Addendum)
You have been evaluated for your chest pain.  Fortunately no signs of heart injury during this ER visit.  Your potassium level is low today, please take supplements prescribed and have it rechecked in 1 week.  Use resource below to seek help for mental support.

## 2021-07-26 NOTE — ED Notes (Signed)
Patient verbalizes understanding of d/c instructions. Opportunities for questions and answers were provided. Pt d/c from ED and wheeled to lobby.  

## 2021-07-26 NOTE — ED Triage Notes (Signed)
Pt to triage via [facility] from home. Per EMS pt c/o headache, nausea, and vomiting x 2 hours.  EMS- BP 96/69 CBG 213  Pt reports pain to center of chest, headache, nausea, vomiting, and SOB x 2 hours.  Pt states she had a syncopal episode and fell yesterday.

## 2021-08-24 ENCOUNTER — Encounter (HOSPITAL_COMMUNITY): Payer: Self-pay | Admitting: Radiology

## 2021-10-26 DEATH — deceased
